# Patient Record
Sex: Female | Born: 1950 | Race: White | Hispanic: No | Marital: Single | Smoking: Never smoker
Health system: Southern US, Community
[De-identification: ages and names within clinical notes are randomized; demographics above are authoritative.]

## PROBLEM LIST (undated history)

## (undated) DIAGNOSIS — Z923 Personal history of irradiation: Secondary | ICD-10-CM

## (undated) DIAGNOSIS — Z9221 Personal history of antineoplastic chemotherapy: Secondary | ICD-10-CM

## (undated) DIAGNOSIS — K649 Unspecified hemorrhoids: Secondary | ICD-10-CM

## (undated) DIAGNOSIS — G43909 Migraine, unspecified, not intractable, without status migrainosus: Secondary | ICD-10-CM

## (undated) DIAGNOSIS — C801 Malignant (primary) neoplasm, unspecified: Secondary | ICD-10-CM

## (undated) DIAGNOSIS — K219 Gastro-esophageal reflux disease without esophagitis: Secondary | ICD-10-CM

## (undated) DIAGNOSIS — Z809 Family history of malignant neoplasm, unspecified: Secondary | ICD-10-CM

## (undated) DIAGNOSIS — J45909 Unspecified asthma, uncomplicated: Secondary | ICD-10-CM

## (undated) DIAGNOSIS — M8580 Other specified disorders of bone density and structure, unspecified site: Secondary | ICD-10-CM

## (undated) DIAGNOSIS — Z1379 Encounter for other screening for genetic and chromosomal anomalies: Secondary | ICD-10-CM

## (undated) DIAGNOSIS — D1722 Benign lipomatous neoplasm of skin and subcutaneous tissue of left arm: Secondary | ICD-10-CM

## (undated) DIAGNOSIS — R569 Unspecified convulsions: Secondary | ICD-10-CM

## (undated) DIAGNOSIS — F419 Anxiety disorder, unspecified: Secondary | ICD-10-CM

## (undated) HISTORY — DX: Family history of malignant neoplasm, unspecified: Z80.9

## (undated) HISTORY — PX: ERCP: SHX60

## (undated) HISTORY — DX: Unspecified asthma, uncomplicated: J45.909

## (undated) HISTORY — DX: Encounter for other screening for genetic and chromosomal anomalies: Z13.79

## (undated) HISTORY — DX: Other specified disorders of bone density and structure, unspecified site: M85.80

## (undated) HISTORY — PX: CATARACT EXTRACTION: SUR2

## (undated) HISTORY — DX: Anxiety disorder, unspecified: F41.9

## (undated) HISTORY — PX: APPENDECTOMY: SHX54

## (undated) HISTORY — PX: LIVER SURGERY: SHX698

## (undated) HISTORY — PX: CHOLECYSTECTOMY: SHX55

## (undated) HISTORY — PX: ABDOMINAL HYSTERECTOMY: SHX81

---

## 1998-11-02 ENCOUNTER — Emergency Department (HOSPITAL_COMMUNITY): Admission: EM | Admit: 1998-11-02 | Discharge: 1998-11-02 | Payer: Self-pay | Admitting: Emergency Medicine

## 2006-05-11 ENCOUNTER — Emergency Department (HOSPITAL_COMMUNITY): Admission: EM | Admit: 2006-05-11 | Discharge: 2006-05-11 | Payer: Self-pay | Admitting: Emergency Medicine

## 2008-06-01 ENCOUNTER — Emergency Department (HOSPITAL_COMMUNITY): Admission: EM | Admit: 2008-06-01 | Discharge: 2008-06-01 | Payer: Self-pay | Admitting: Emergency Medicine

## 2012-03-26 ENCOUNTER — Emergency Department (HOSPITAL_COMMUNITY): Payer: Self-pay

## 2012-03-26 ENCOUNTER — Encounter (HOSPITAL_COMMUNITY): Payer: Self-pay | Admitting: *Deleted

## 2012-03-26 ENCOUNTER — Emergency Department (HOSPITAL_COMMUNITY)
Admission: EM | Admit: 2012-03-26 | Discharge: 2012-03-27 | Disposition: A | Discharge status: Home / Self Care | Payer: Self-pay | Attending: Emergency Medicine | Admitting: Emergency Medicine

## 2012-03-26 DIAGNOSIS — G43909 Migraine, unspecified, not intractable, without status migrainosus: Secondary | ICD-10-CM | POA: Insufficient documentation

## 2012-03-26 DIAGNOSIS — J45909 Unspecified asthma, uncomplicated: Secondary | ICD-10-CM | POA: Insufficient documentation

## 2012-03-26 DIAGNOSIS — R569 Unspecified convulsions: Secondary | ICD-10-CM | POA: Insufficient documentation

## 2012-03-26 DIAGNOSIS — H1130 Conjunctival hemorrhage, unspecified eye: Secondary | ICD-10-CM | POA: Insufficient documentation

## 2012-03-26 DIAGNOSIS — Z79899 Other long term (current) drug therapy: Secondary | ICD-10-CM | POA: Insufficient documentation

## 2012-03-26 HISTORY — DX: Unspecified hemorrhoids: K64.9

## 2012-03-26 HISTORY — DX: Migraine, unspecified, not intractable, without status migrainosus: G43.909

## 2012-03-26 HISTORY — DX: Unspecified convulsions: R56.9

## 2012-03-26 LAB — CBC WITH DIFFERENTIAL/PLATELET
Basophils Relative: 1 % (ref 0–1)
Eosinophils Absolute: 0.1 10*3/uL (ref 0.0–0.7)
Eosinophils Relative: 1 % (ref 0–5)
HCT: 41.4 % (ref 36.0–46.0)
MCV: 89 fL (ref 78.0–100.0)
Monocytes Absolute: 0.4 10*3/uL (ref 0.1–1.0)
Neutrophils Relative %: 49 % (ref 43–77)
RDW: 12.5 % (ref 11.5–15.5)
WBC: 7 10*3/uL (ref 4.0–10.5)

## 2012-03-26 LAB — BASIC METABOLIC PANEL
CO2: 27 mEq/L (ref 19–32)
Calcium: 9.8 mg/dL (ref 8.4–10.5)
GFR calc non Af Amer: 89 mL/min — ABNORMAL LOW (ref >90)
Glucose, Bld: 93 mg/dL (ref 70–99)
Potassium: 3.2 mEq/L — ABNORMAL LOW (ref 3.5–5.1)

## 2012-03-26 MED ORDER — SODIUM CHLORIDE 0.9 % IV BOLUS (SEPSIS)
1000.0000 mL | Freq: Once | INTRAVENOUS | Status: AC
  Administered 2012-03-26: 1000 mL via INTRAVENOUS

## 2012-03-26 MED ORDER — PROMETHAZINE HCL 25 MG PO TABS: 25.0000 mg | ORAL_TABLET | Freq: Four times a day (QID) | ORAL | Status: DC | PRN

## 2012-03-26 MED ORDER — DEXAMETHASONE SODIUM PHOSPHATE 4 MG/ML IJ SOLN: 10.0000 mg | Freq: Once | INTRAMUSCULAR | Status: DC

## 2012-03-26 MED ORDER — DEXAMETHASONE SODIUM PHOSPHATE 10 MG/ML IJ SOLN
10.0000 mg | Freq: Once | INTRAMUSCULAR | Status: AC
  Administered 2012-03-26: 10 mg via INTRAVENOUS
  Filled 2012-03-26: qty 1

## 2012-03-26 MED ORDER — SODIUM CHLORIDE 0.9 % IV SOLN: INTRAVENOUS | Status: DC

## 2012-03-26 MED ORDER — PROMETHAZINE HCL 25 MG/ML IJ SOLN
12.5000 mg | Freq: Once | INTRAMUSCULAR | Status: AC
  Administered 2012-03-26: 12.5 mg via INTRAVENOUS
  Filled 2012-03-26: qty 1

## 2012-03-26 MED ORDER — HYDROMORPHONE HCL PF 1 MG/ML IJ SOLN
1.0000 mg | Freq: Once | INTRAMUSCULAR | Status: AC
  Administered 2012-03-26: 1 mg via INTRAVENOUS
  Filled 2012-03-26: qty 1

## 2012-03-26 MED ORDER — HYDROCODONE-ACETAMINOPHEN 5-325 MG PO TABS: 1.0000 | ORAL_TABLET | Freq: Four times a day (QID) | ORAL | Status: AC | PRN

## 2012-03-26 MED ORDER — DIPHENHYDRAMINE HCL 50 MG/ML IJ SOLN
25.0000 mg | Freq: Once | INTRAMUSCULAR | Status: AC
  Administered 2012-03-26: 25 mg via INTRAVENOUS
  Filled 2012-03-26: qty 1

## 2012-03-26 NOTE — ED Notes (Signed)
Headache for 2 days, had a sz last pm. Has hx of sz, treated with vistaril?    Has subconjunctival hemorrhage rt eye.

## 2012-03-26 NOTE — ED Notes (Addendum)
Had seizure last pm and afterwards noticed bleed in her eye. States it feels like pins and needles. Has also had a HA x 2 days came in because it became unbearable. States she is seeing black spots that float around, "like a beach ball." States that whenever she has a migraine this occurs. Also states that lights and sound make HA worse.

## 2012-03-26 NOTE — ED Provider Notes (Signed)
History  This chart was scribed for Shelda Jakes, MD by Erskine Emery. This patient was seen in room APA15/[facility]15 and the patient's care was started at 17:00.   CSN: [id number]  Arrival date & time 03/26/12  1620   First MD Initiated Contact with Patient 03/26/12 1700      Chief Complaint  Patient presents with  . Headache    (Consider location/radiation/quality/duration/timing/severity/associated sxs/prior treatment) The history is provided by the patient. No language interpreter was used.  Mary Wise is a 61 y.o. female who presents to the Emergency Department complaining of a severe (10/10) headache as of the last 2 days, that starts at the back of the neck and radiates to the forehead. Pt reports her pain is similar to previous episodes in her h/o migraines. Pt also reports she had a seizure last night, around midnight with associated LOC, subconjictival hemorrhage, and eye pain. She reports this seizure was similar to previous episodes in her h/o seizures. Pt denies any nausea, vomiting, fever, chest pain, abdominal pain, or dysuria but does report a rash under the breasts bilaterally. Pt has profuse rectal bleeding regularly from a worsening h/o hemorrhoids. Pt also reports some SOB, associated with asthma and claims she has already used her inhaler twice today. 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 Pt usually takes vistaril for the seizures and Midrin for her migraines but reports she never takes her medication as prescribed because she saves it for when things get really bad. Pt claims she also has a h/o fibromyalgia and arthritis. Pt is allergic to penicillin, sulfa antibiotics, onions, and yellow dyes.  Pt currently has no PCP because she has no insurance or money. She reports she tried to get into the free clinic and they wouldn't take her because she couldn't present tax records. She used to see Dr. Carlyle Basques in [place], [place]  but moved here in 2006.   Past Medical History  Diagnosis Date  . Migraine   . Seizures   . Hemorrhoids     Past Surgical History  Procedure Date  . Cholecystectomy   . Appendectomy   . Cesarean section   . Abdominal hysterectomy     History reviewed. No pertinent family history.  History  Substance Use Topics  . Smoking status: Never Smoker   . Smokeless tobacco: Not on file  . Alcohol Use: No    OB History    Grav Para Term Preterm Abortions TAB SAB Ect Mult Living                  Review of Systems  Constitutional: Negative for fever and chills.  HENT: Negative for sore throat.   Eyes: Positive for pain and redness.  Respiratory: Positive for shortness of breath.   Cardiovascular: Negative for chest pain.  Gastrointestinal: Positive for anal bleeding. Negative for nausea, vomiting and abdominal pain.  Genitourinary: Negative for dysuria.  Musculoskeletal: Negative for joint swelling.  Skin: Positive for rash.  Neurological: Positive for seizures, syncope and headaches.  Psychiatric/Behavioral: Negative for self-injury.  All other systems reviewed and are negative.    Allergies  Onion; Penicillins; Sulfa antibiotics; and Yellow dyes (non-tartrazine)  Home Medications   Current Outpatient Rx  Name Route Sig Dispense Refill  . HYDROXYZINE PAMOATE 25 MG PO CAPS Oral Take 25 mg by mouth daily as needed. For symptoms of seizure    . POLYVINYL ALCOHOL-POVIDONE 5-6 MG/ML OP  SOLN Ophthalmic Apply 50 drops to eye 3 (three) times daily as needed. For dry eye relief.    Marland Kitchen HYDROCODONE-ACETAMINOPHEN 5-325 MG PO TABS Oral Take 1-2 tablets by mouth every 6 (six) hours as needed for pain. 14 tablet 0  . PROMETHAZINE HCL 25 MG PO TABS Oral Take 1 tablet (25 mg total) by mouth every 6 (six) hours as needed for nausea. 12 tablet 0    Triage Vitals: BP 146/96  Pulse 86  Temp 98.4 F (36.9 C) (Oral)  Resp 18  Ht 5\' 11"  (1.803 m)  Wt 160 lb (72.576 kg)  BMI 22.32  kg/m2  SpO2 96%  Physical Exam  Nursing note and vitals reviewed. Constitutional: She is oriented to person, place, and time. She appears well-developed and well-nourished. No distress.  HENT:  Head: Normocephalic and atraumatic.  Mouth/Throat: Oropharynx is clear and moist.  Eyes: EOM are normal. Pupils are equal, round, and reactive to light.       Right eye: subconjuctival hemorrhage but no hyphema.  Neck: Neck supple. No tracheal deviation present. No thyromegaly present.  Cardiovascular: Normal rate, regular rhythm and normal heart sounds.   No murmur heard. Pulmonary/Chest: Effort normal and breath sounds normal. No respiratory distress. She has no wheezes.  Abdominal: Soft. Bowel sounds are normal. She exhibits no distension. There is no tenderness.  Musculoskeletal: Normal range of motion. She exhibits no edema.       Can move all extremities easily.   Lymphadenopathy:    She has no cervical adenopathy.  Neurological: She is alert and oriented to person, place, and time.  Skin: Skin is warm and dry.  Psychiatric: She has a normal mood and affect.    ED Course  Procedures (including critical care time) DIAGNOSTIC STUDIES: Oxygen Saturation is 96% on room air, adequate by my interpretation.    COORDINATION OF CARE: 17:15--I evaluated the patient and we discussed a treatment plan including CAT scan and migraine cocktail to which the pt agreed. I instructed the pt to find a PCP. I told the pt that her hemorrhoids have progressed enough that she probably needs surgery. I recommended she soak in the tub for a couple minutes a day.   Labs Reviewed  BASIC METABOLIC PANEL - Abnormal; Notable for the following:    Potassium 3.2 (*)     GFR calc non Af Amer 89 (*)     All other components within normal limits  CBC WITH DIFFERENTIAL   Ct Head Wo Contrast  03/26/2012  *RADIOLOGY REPORT*  Clinical Data: 61 year old female with headache, seizure this morning.  CT HEAD WITHOUT  CONTRAST  Technique:  Contiguous axial images were obtained from the base of the skull through the vertex without contrast.  Comparison: None.  Findings: Visualized paranasal sinuses and mastoids are clear. Benign mild exostosis of the calvarium at the left vertex.  No osseous abnormality identified.  Postoperative changes to the globes.  Negative scalp soft tissues.  Normal cerebral volume.  No ventriculomegaly. No evidence of cortically based acute infarction identified.  No acute intracranial hemorrhage identified.  No midline shift, mass effect, or evidence of mass lesion.  Minimal to mild subcortical white matter hypodensity. No suspicious intracranial vascular hyperdensity.  IMPRESSION: Negative for age noncontrast CT appearance of the brain.   Original Report Authenticated By: Harley Hallmark, M.D.    Results for orders placed during the hospital encounter of 03/26/12  CBC WITH DIFFERENTIAL      Component Value Range  WBC 7.0  4.0 - 10.5 K/uL   RBC 4.65  3.87 - 5.11 MIL/uL   Hemoglobin 14.0  12.0 - 15.0 g/dL   HCT 78.2  95.6 - 21.3 %   MCV 89.0  78.0 - 100.0 fL   MCH 30.1  26.0 - 34.0 pg   MCHC 33.8  30.0 - 36.0 g/dL   RDW 08.6  57.8 - 46.9 %   Platelets 313  150 - 400 K/uL   Neutrophils Relative 49  43 - 77 %   Neutro Abs 3.4  1.7 - 7.7 K/uL   Lymphocytes Relative 44  12 - 46 %   Lymphs Abs 3.1  0.7 - 4.0 K/uL   Monocytes Relative 6  3 - 12 %   Monocytes Absolute 0.4  0.1 - 1.0 K/uL   Eosinophils Relative 1  0 - 5 %   Eosinophils Absolute 0.1  0.0 - 0.7 K/uL   Basophils Relative 1  0 - 1 %   Basophils Absolute 0.1  0.0 - 0.1 K/uL  BASIC METABOLIC PANEL      Component Value Range   Sodium 141  135 - 145 mEq/L   Potassium 3.2 (*) 3.5 - 5.1 mEq/L   Chloride 103  96 - 112 mEq/L   CO2 27  19 - 32 mEq/L   Glucose, Bld 93  70 - 99 mg/dL   BUN 12  6 - 23 mg/dL   Creatinine, Ser 6.29  0.50 - 1.10 mg/dL   Calcium 9.8  8.4 - 52.8 mg/dL   GFR calc non Af Amer 89 (*) >90 mL/min   GFR  calc Af Amer >90  >90 mL/min     1. Migraine headache       MDM   Patient's main complaint was migraine headache. It had gotten severe improve somewhat migraine cocktail improved significantly with 1 mg of hydromorphone IV. Patient's headache is now completely resolved. Patient does not have a primary care provider so resource guide provided to help her find a primary care provider currently nontoxic no acute distress. Patient also has a history of external hemorrhoids discussion of what needs to be done for that was given to her starting point will be a primary care Dr.  [place] Pink CT was negative.     I personally performed the services described in this documentation, which was scribed in my presence. The recorded information has been reviewed and considered.     Shelda Jakes, MD 03/27/12 [phone]

## 2015-10-28 ENCOUNTER — Emergency Department (HOSPITAL_COMMUNITY): Payer: Self-pay

## 2015-10-28 ENCOUNTER — Emergency Department (HOSPITAL_COMMUNITY)
Admission: EM | Admit: 2015-10-28 | Discharge: 2015-10-29 | Disposition: A | Discharge status: Home / Self Care | Payer: Self-pay | Attending: Emergency Medicine | Admitting: Emergency Medicine

## 2015-10-28 ENCOUNTER — Encounter (HOSPITAL_COMMUNITY): Payer: Self-pay

## 2015-10-28 DIAGNOSIS — Z79899 Other long term (current) drug therapy: Secondary | ICD-10-CM | POA: Insufficient documentation

## 2015-10-28 DIAGNOSIS — R1111 Vomiting without nausea: Secondary | ICD-10-CM

## 2015-10-28 DIAGNOSIS — R6889 Other general symptoms and signs: Secondary | ICD-10-CM

## 2015-10-28 DIAGNOSIS — J111 Influenza due to unidentified influenza virus with other respiratory manifestations: Secondary | ICD-10-CM | POA: Insufficient documentation

## 2015-10-28 DIAGNOSIS — R509 Fever, unspecified: Secondary | ICD-10-CM

## 2015-10-28 LAB — CBC WITH DIFFERENTIAL/PLATELET
Basophils Absolute: 0.1 10*3/uL (ref 0.0–0.1)
Basophils Relative: 1 %
EOS ABS: 0 10*3/uL (ref 0.0–0.7)
EOS PCT: 1 %
HCT: 38.2 % (ref 36.0–46.0)
Hemoglobin: 12.9 g/dL (ref 12.0–15.0)
LYMPHS ABS: 0.7 10*3/uL (ref 0.7–4.0)
LYMPHS PCT: 15 %
MCH: 30.1 pg (ref 26.0–34.0)
MCHC: 33.8 g/dL (ref 30.0–36.0)
MCV: 89 fL (ref 78.0–100.0)
MONO ABS: 0.5 10*3/uL (ref 0.1–1.0)
MONOS PCT: 11 %
Neutro Abs: 3.5 10*3/uL (ref 1.7–7.7)
Neutrophils Relative %: 72 %
PLATELETS: 233 10*3/uL (ref 150–400)
RBC: 4.29 MIL/uL (ref 3.87–5.11)
RDW: 12.8 % (ref 11.5–15.5)
WBC: 4.9 10*3/uL (ref 4.0–10.5)

## 2015-10-28 MED ORDER — SODIUM CHLORIDE 0.9 % IV BOLUS (SEPSIS): 1000.0000 mL | Freq: Once | INTRAVENOUS | Status: DC

## 2015-10-28 MED ORDER — SODIUM CHLORIDE 0.9 % IV BOLUS (SEPSIS)
1000.0000 mL | INTRAVENOUS | Status: AC
  Administered 2015-10-29 (×2): 1000 mL via INTRAVENOUS

## 2015-10-28 MED ORDER — ONDANSETRON HCL 4 MG/2ML IJ SOLN
4.0000 mg | Freq: Once | INTRAMUSCULAR | Status: AC
  Administered 2015-10-29: 4 mg via INTRAVENOUS
  Filled 2015-10-28: qty 2

## 2015-10-28 MED ORDER — SODIUM CHLORIDE 0.9 % IV BOLUS (SEPSIS)
500.0000 mL | INTRAVENOUS | Status: AC
  Administered 2015-10-29: 500 mL via INTRAVENOUS

## 2015-10-28 MED ORDER — ACETAMINOPHEN 500 MG PO TABS
1000.0000 mg | ORAL_TABLET | Freq: Once | ORAL | Status: AC
  Administered 2015-10-29: 1000 mg via ORAL
  Filled 2015-10-28: qty 2

## 2015-10-28 NOTE — ED Notes (Signed)
I have nausea and vomiting. Having chills. Body aches and fever.

## 2015-10-28 NOTE — ED Provider Notes (Signed)
CSN: [id number]     Arrival date & time 10/28/15  2223 History  By signing my name below, I, [facility], attest that this documentation has been prepared under the direction and in the presence of Merryl Hacker, MD. Electronically Signed: Virgel Bouquet, ED Scribe. 10/28/2015. 2:32 AM.   Chief Complaint  Patient presents with  . Emesis   The history is provided by the patient. No language interpreter was used.   HPI Comments: Mary Wise is a 65 y.o. female with an hx of seizures who presents to the Emergency Department complaining of intermittent, mild emesis onset 4 days ago. Patient reports general malaise for the past 4 days with nausea, subjective fevers, chills, generalized body aches, and back pain. She endorses associated SOB. She has used OTC pain patches, ear drops, isopropyl alcohol and witch hazel, and taken cool showers without relief. She has not received the influenza vaccination this year. Hx of asthma. Denies diarrhea, abdominal pain, CP, sore throat.  Past Medical History  Diagnosis Date  . Migraine   . Seizures ([place])   . Hemorrhoids    Past Surgical History  Procedure Laterality Date  . Cholecystectomy    . Appendectomy    . Cesarean section    . Abdominal hysterectomy     No family history on file. Social History  Substance Use Topics  . Smoking status: Never Smoker   . Smokeless tobacco: None  . Alcohol Use: No   OB History    No data available     Review of Systems  Constitutional: Positive for fever and chills.  HENT: Negative for sore throat.   Respiratory: Positive for shortness of breath.   Cardiovascular: Negative for chest pain.  Gastrointestinal: Positive for nausea and vomiting. Negative for abdominal pain and diarrhea.  Genitourinary: Negative for dysuria and hematuria.  Neurological: Positive for headaches.  All other systems reviewed and are negative.     Allergies  Onion; Penicillins; Sulfa antibiotics; and Yellow  dyes (non-tartrazine)  Home Medications   Prior to Admission medications   Medication Sig Start Date End Date Taking? Authorizing Provider  albuterol (PROVENTIL HFA;VENTOLIN HFA) 108 (90 Base) MCG/ACT inhaler Inhale 2 puffs into the lungs every 6 (six) hours as needed for wheezing or shortness of breath.   Yes Historical Provider, MD  ibuprofen (ADVIL,MOTRIN) 400 MG tablet Take 1 tablet (400 mg total) by mouth every 6 (six) hours as needed. 10/29/15   Merryl Hacker, MD  ondansetron (ZOFRAN ODT) 4 MG disintegrating tablet Take 1 tablet (4 mg total) by mouth every 8 (eight) hours as needed for nausea or vomiting. 10/29/15   Merryl Hacker, MD  promethazine (PHENERGAN) 25 MG tablet Take 1 tablet (25 mg total) by mouth every 6 (six) hours as needed for nausea. 03/26/12 04/02/12  Fredia Sorrow, MD   BP 118/68 mmHg  Pulse 97  Temp(Src) 101.1 F (38.4 C) (Rectal)  Resp 20  Ht 5\' 10"  (1.778 m)  Wt 160 lb (72.576 kg)  BMI 22.96 kg/m2  SpO2 92% Physical Exam  Constitutional: She is oriented to person, place, and time. She appears well-developed and well-nourished.  Uncomfortable appearing but nontoxic  HENT:  Head: Normocephalic and atraumatic.  Right Ear: External ear normal.  Left Ear: External ear normal.  Mouth/Throat: Oropharynx is clear and moist. No oropharyngeal exudate.  Eyes: Pupils are equal, round, and reactive to light.  Cardiovascular: Regular rhythm and normal heart sounds.   Tachycardia  Pulmonary/Chest: Effort normal and  breath sounds normal. No respiratory distress. She has no wheezes.  Abdominal: Soft. Bowel sounds are normal. There is no tenderness. There is no rebound and no guarding.  Neurological: She is alert and oriented to person, place, and time.  Skin: Skin is warm and dry.  Psychiatric: She has a normal mood and affect.  Nursing note and vitals reviewed.   ED Course  Procedures   DIAGNOSTIC STUDIES: Oxygen Saturation is 98% on RA, normal by my  interpretation.    COORDINATION OF CARE: 11:16 PM Will order IV fluids, Zofran, chest x-ray, labs. Discussed treatment plan with pt at bedside and pt agreed to plan.   Labs Review Labs Reviewed  COMPREHENSIVE METABOLIC PANEL - Abnormal; Notable for the following:    Glucose, Bld 110 (*)    Calcium 8.7 (*)    AST 52 (*)    All other components within normal limits  URINALYSIS, ROUTINE W REFLEX MICROSCOPIC (NOT AT [facility]) - Abnormal; Notable for the following:    Hgb urine dipstick LARGE (*)    Ketones, ur >80 (*)    All other components within normal limits  URINE MICROSCOPIC-ADD ON - Abnormal; Notable for the following:    Squamous Epithelial / LPF 0-5 (*)    Bacteria, UA FEW (*)    All other components within normal limits  CULTURE, BLOOD (ROUTINE X 2)  CULTURE, BLOOD (ROUTINE X 2)  URINE CULTURE  CBC WITH DIFFERENTIAL/PLATELET  LIPASE, BLOOD  LACTIC ACID, PLASMA  TROPONIN I  CK    Imaging Review Dg Chest 2 View  10/29/2015  CLINICAL DATA:  Acute onset of generalized malaise, nausea, subjective fever, chills, generalized body aches and back pain. Shortness of breath. Initial encounter. EXAM: CHEST  2 VIEW COMPARISON:  None. FINDINGS: The lungs are well-aerated. Mild left basilar atelectasis is noted. There is no evidence of pleural effusion or pneumothorax. The heart is normal in size; the mediastinal contour is within normal limits. No acute osseous abnormalities are seen. IMPRESSION: Mild left basilar atelectasis noted.  Lungs otherwise clear. Electronically Signed   By: Garald Balding M.D.   On: 10/29/2015 00:37   I have personally reviewed and evaluated these images and lab results as part of my medical decision-making.   EKG Interpretation   Date/Time:  Sunday October 28 2015 23:43:19 EDT Ventricular Rate:  95 PR Interval:  127 QRS Duration: 101 QT Interval:  337 QTC Calculation: 424 R Axis:   55 Text Interpretation:  Sinus rhythm Premature ventricular complexes  Minimal  ST depression, diffuse leads Confirmed by Sanyia Dini  MD, Jevon Littlepage ([id number]) on  10/29/2015 12:09:48 AM      MDM   Final diagnoses:  Flu-like symptoms  Other specified fever  Non-intractable vomiting without nausea, vomiting of unspecified type    Patient presents with chills, myalgia, upper respiratory symptoms and vomiting. She is uncomfortable appearing but nontoxic. Does have tachycardia to 120.  Rectal temp 102.4. Sepsis workup initiated. Workup is largely unremarkable including normal lactate. No evidence of pneumonia or urinary tract infection. Suspect flulike illness and viral syndrome. She is 3 days into illness and does not meet criteria for Tamiflu. She's received several liters of fluid and nausea medication. On recheck, she states that she "feels better." Vital signs have normalized. She is able to tolerate fluids. Discuss with patient further supportive care at home with Zofran, ibuprofen. Patient stated understanding.  After history, exam, and medical workup I feel the patient has been appropriately medically screened and is safe  for discharge home. Pertinent diagnoses were discussed with the patient. Patient was given return precautions.  I personally performed the services described in this documentation, which was scribed in my presence. The recorded information has been reviewed and is accurate.    Merryl Hacker, MD 10/29/15 [phone]

## 2015-10-29 LAB — COMPREHENSIVE METABOLIC PANEL
ALT: 45 U/L (ref 14–54)
ANION GAP: 9 (ref 5–15)
AST: 52 U/L — ABNORMAL HIGH (ref 15–41)
Albumin: 4.3 g/dL (ref 3.5–5.0)
Alkaline Phosphatase: 87 U/L (ref 38–126)
BUN: 8 mg/dL (ref 6–20)
CALCIUM: 8.7 mg/dL — AB (ref 8.9–10.3)
CHLORIDE: 103 mmol/L (ref 101–111)
CO2: 23 mmol/L (ref 22–32)
Creatinine, Ser: 0.68 mg/dL (ref 0.44–1.00)
GFR calc non Af Amer: >60 mL/min (ref >60)
Glucose, Bld: 110 mg/dL — ABNORMAL HIGH (ref 65–99)
POTASSIUM: 3.7 mmol/L (ref 3.5–5.1)
SODIUM: 135 mmol/L (ref 135–145)
Total Bilirubin: 0.4 mg/dL (ref 0.3–1.2)
Total Protein: 7.8 g/dL (ref 6.5–8.1)

## 2015-10-29 LAB — URINALYSIS, ROUTINE W REFLEX MICROSCOPIC
BILIRUBIN URINE: NEGATIVE
Glucose, UA: NEGATIVE mg/dL
Ketones, ur: >80 mg/dL — AB
LEUKOCYTES UA: NEGATIVE
NITRITE: NEGATIVE
Protein, ur: NEGATIVE mg/dL
SPECIFIC GRAVITY, URINE: 1.01 (ref 1.005–1.030)
pH: 8 (ref 5.0–8.0)

## 2015-10-29 LAB — URINE MICROSCOPIC-ADD ON

## 2015-10-29 LAB — TROPONIN I

## 2015-10-29 LAB — LIPASE, BLOOD: Lipase: 35 U/L (ref 11–51)

## 2015-10-29 LAB — LACTIC ACID, PLASMA: LACTIC ACID, VENOUS: 1 mmol/L (ref 0.5–2.0)

## 2015-10-29 LAB — CK: Total CK: 86 U/L (ref 38–234)

## 2015-10-29 MED ORDER — ONDANSETRON 4 MG PO TBDP: 4.0000 mg | ORAL_TABLET | Freq: Three times a day (TID) | ORAL | Status: DC | PRN

## 2015-10-29 MED ORDER — IBUPROFEN 400 MG PO TABS: 400.0000 mg | ORAL_TABLET | Freq: Four times a day (QID) | ORAL | Status: DC | PRN

## 2015-10-29 MED ORDER — IBUPROFEN 400 MG PO TABS
ORAL_TABLET | ORAL | Status: AC
  Administered 2015-10-29: 600 mg
  Filled 2015-10-29: qty 2

## 2015-10-29 MED ORDER — IBUPROFEN 400 MG PO TABS
600.0000 mg | ORAL_TABLET | Freq: Once | ORAL | Status: DC
Start: 2015-10-29 — End: 2015-10-29
  Filled 2015-10-29: qty 2

## 2015-10-29 NOTE — ED Notes (Signed)
Pt c/o cramping all over not just in stomach.  "It's in my ribs, my back bone, behind my knees".

## 2015-10-29 NOTE — ED Notes (Signed)
PT provided with ginger ale, no vomiting

## 2015-10-29 NOTE — ED Notes (Signed)
Pt states understanding of care given and follow up instructions.  Ambulated from ED  

## 2015-10-29 NOTE — ED Notes (Signed)
Pt had one small amount of "spit up" since medication.

## 2015-10-29 NOTE — Discharge Instructions (Signed)
Viral Infections °A viral infection can be caused by different types of viruses. Most viral infections are not serious and resolve on their own. However, some infections may cause severe symptoms and may lead to further complications. °SYMPTOMS °Viruses can frequently cause: °· Minor sore throat. °· Aches and pains. °· Headaches. °· Runny nose. °· Different types of rashes. °· Watery eyes. °· Tiredness. °· Cough. °· Loss of appetite. °· Gastrointestinal infections, resulting in nausea, vomiting, and diarrhea. °These symptoms do not respond to antibiotics because the infection is not caused by bacteria. However, you might catch a bacterial infection following the viral infection. This is sometimes called a "superinfection." Symptoms of such a bacterial infection may include: °· Worsening sore throat with pus and difficulty swallowing. °· Swollen neck glands. °· Chills and a high or persistent fever. °· Severe headache. °· Tenderness over the sinuses. °· Persistent overall ill feeling (malaise), muscle aches, and tiredness (fatigue). °· Persistent cough. °· Yellow, green, or brown mucus production with coughing. °HOME CARE INSTRUCTIONS  °· Only take over-the-counter or prescription medicines for pain, discomfort, diarrhea, or fever as directed by your caregiver. °· Drink enough water and fluids to keep your urine clear or pale yellow. Sports drinks can provide valuable electrolytes, sugars, and hydration. °· Get plenty of rest and maintain proper nutrition. Soups and broths with crackers or rice are fine. °SEEK IMMEDIATE MEDICAL CARE IF:  °· You have severe headaches, shortness of breath, chest pain, neck pain, or an unusual rash. °· You have uncontrolled vomiting, diarrhea, or you are unable to keep down fluids. °· You or your child has an oral temperature above 102° F (38.9° C), not controlled by medicine. °· Your baby is older than 3 months with a rectal temperature of 102° F (38.9° C) or higher. °· Your baby is 3  months old or younger with a rectal temperature of 100.4° F (38° C) or higher. °MAKE SURE YOU:  °· Understand these instructions. °· Will watch your condition. °· Will get help right away if you are not doing well or get worse. °  °This information is not intended to replace advice given to you by your health care provider. Make sure you discuss any questions you have with your health care provider. °  °Document Released: 04/30/2005 Document Revised: 10/13/2011 Document Reviewed: 12/27/2014 °Elsevier Interactive Patient Education ©2016 [employer] ° °

## 2015-10-31 LAB — URINE CULTURE

## 2015-11-03 LAB — CULTURE, BLOOD (ROUTINE X 2)
CULTURE: NO GROWTH
Culture: NO GROWTH

## 2016-08-04 HISTORY — PX: BREAST LUMPECTOMY: SHX2

## 2016-12-30 DIAGNOSIS — Z789 Other specified health status: Secondary | ICD-10-CM | POA: Diagnosis not present

## 2016-12-30 DIAGNOSIS — M25562 Pain in left knee: Secondary | ICD-10-CM | POA: Diagnosis not present

## 2016-12-30 DIAGNOSIS — M170 Bilateral primary osteoarthritis of knee: Secondary | ICD-10-CM | POA: Diagnosis not present

## 2016-12-30 DIAGNOSIS — M25561 Pain in right knee: Secondary | ICD-10-CM | POA: Diagnosis not present

## 2017-01-06 DIAGNOSIS — Z789 Other specified health status: Secondary | ICD-10-CM | POA: Diagnosis not present

## 2017-01-06 DIAGNOSIS — M25562 Pain in left knee: Secondary | ICD-10-CM | POA: Diagnosis not present

## 2017-01-06 DIAGNOSIS — M25561 Pain in right knee: Secondary | ICD-10-CM | POA: Diagnosis not present

## 2017-01-06 DIAGNOSIS — M170 Bilateral primary osteoarthritis of knee: Secondary | ICD-10-CM | POA: Diagnosis not present

## 2017-01-13 DIAGNOSIS — M25562 Pain in left knee: Secondary | ICD-10-CM | POA: Diagnosis not present

## 2017-01-13 DIAGNOSIS — M170 Bilateral primary osteoarthritis of knee: Secondary | ICD-10-CM | POA: Diagnosis not present

## 2017-01-13 DIAGNOSIS — M25561 Pain in right knee: Secondary | ICD-10-CM | POA: Diagnosis not present

## 2017-05-26 DIAGNOSIS — M25561 Pain in right knee: Secondary | ICD-10-CM | POA: Diagnosis not present

## 2017-05-26 DIAGNOSIS — M170 Bilateral primary osteoarthritis of knee: Secondary | ICD-10-CM | POA: Diagnosis not present

## 2017-05-26 DIAGNOSIS — M25562 Pain in left knee: Secondary | ICD-10-CM | POA: Diagnosis not present

## 2017-06-03 ENCOUNTER — Encounter: Payer: Self-pay | Admitting: Family Medicine

## 2017-06-03 ENCOUNTER — Ambulatory Visit (INDEPENDENT_AMBULATORY_CARE_PROVIDER_SITE_OTHER): Payer: Medicare Other | Admitting: Family Medicine

## 2017-06-03 VITALS — BP 140/70 | HR 84 | Temp 98.2°F | Resp 16 | Ht 68.0 in | Wt 191.8 lb

## 2017-06-03 DIAGNOSIS — J4520 Mild intermittent asthma, uncomplicated: Secondary | ICD-10-CM

## 2017-06-03 DIAGNOSIS — R030 Elevated blood-pressure reading, without diagnosis of hypertension: Secondary | ICD-10-CM | POA: Diagnosis not present

## 2017-06-03 DIAGNOSIS — R7309 Other abnormal glucose: Secondary | ICD-10-CM

## 2017-06-03 DIAGNOSIS — Z113 Encounter for screening for infections with a predominantly sexual mode of transmission: Secondary | ICD-10-CM

## 2017-06-03 DIAGNOSIS — F39 Unspecified mood [affective] disorder: Secondary | ICD-10-CM | POA: Diagnosis not present

## 2017-06-03 DIAGNOSIS — R5383 Other fatigue: Secondary | ICD-10-CM | POA: Diagnosis not present

## 2017-06-03 DIAGNOSIS — M7989 Other specified soft tissue disorders: Secondary | ICD-10-CM

## 2017-06-03 DIAGNOSIS — N649 Disorder of breast, unspecified: Secondary | ICD-10-CM | POA: Diagnosis not present

## 2017-06-03 DIAGNOSIS — G43009 Migraine without aura, not intractable, without status migrainosus: Secondary | ICD-10-CM

## 2017-06-03 DIAGNOSIS — M799 Soft tissue disorder, unspecified: Secondary | ICD-10-CM

## 2017-06-03 MED ORDER — NAPROXEN 500 MG PO TABS: 500.0000 mg | ORAL_TABLET | Freq: Two times a day (BID) | ORAL | 0 refills | Status: DC | PRN

## 2017-06-03 MED ORDER — ALBUTEROL SULFATE HFA 108 (90 BASE) MCG/ACT IN AERS: 1.0000 | INHALATION_SPRAY | Freq: Four times a day (QID) | RESPIRATORY_TRACT | 1 refills | Status: DC | PRN

## 2017-06-03 NOTE — Progress Notes (Signed)
Patient ID: Mary Wise, female    DOB: [birth year], 66 y.o.   MRN: [id number]  Chief Complaint  Patient presents with  . Mass    left breast, left side of head, left shoulder    Allergies Onion; Penicillins; Sulfa antibiotics; Yellow dyes (non-tartrazine); and Clindamycin/lincomycin  Subjective:   Mary Wise is a 66 y.o. female who presents to [facility] today.  HPI Here to establish care. Reports that she is very emotional b/c her niece died and is going to be buried tomorrow and she has a migraine. She reports that she has not seen a doctor in many years other than going to various emergency departments. She gives a long list of medications that she takes, but reports she doesn't always take them every day and she tends to swallow oral a way medications to use when she needs them. Upon further questioning patient reports that she does get medications that she needs from her family members.  She reports that she comes in today because she has had a knot on shoulder since 2006. Reports that she was seen by a cancer doctor but he never did a biopsy, he just kept measuring the lesion. She reports she also had an x-ray and it did not show up on the x-ray. She is unable to remember the oncologist who did the evaluation in the past. But then tells me that this was done in [place] or another state. She does report that has been going to the [facility] clinic in [place] for knee pain. She reports that the lady who does the sonograms at the clinic, she became friends with her and she decided to ultrasound the area on her shoulder and in her breast. Reports that was told by the sonographer that she has a "gray mass" on left shoulder that is solid and has a breast mass on the left breast which was told by the sonographer that it was like a piece of hard coal in her breast. She reports she has had no other evaluation of these areas/lesions since 2004 or 2006.  She reports that both  of these areas have been there since 2004. However they have grown since that time. She reports that the areas are painful. Reports that because of the size of these lesions she cannot lift her left arm. Reports that ibuprofen does not do anything for her pain. She reports that she does not want to try any other medications for the pain other than hydrocodone because she is scared of other medications. She reports that these new medications can be more dangerous and she is only interested in trying hydrocodone.  She reports that she also needs medicine for her anxiety. She reports that she has had anxiety since she was 66 years old. Reports she was always a hyper child and very busy and advanced for her age. Reports she has been on Valium on and off since she was 66 years old. However, also reports that she has taken ativan and xanax. When asked patient where she has been getting the medications she says she has saved them for long periods of time and that she has been getting them from her mother and brother. Reports that takes them b/c she is very "hypertension, hyper, anxious". Reports that is scared to take other medications for headache and nerves other that the xanax, ativan, or valium.   Patient reports that she has a migraine today would like a refill on her medication.  Reports she has been given Lortab by the emergency department for her pain due to her migraines. She has never been tried on any other migraine medicine. She reports that this headache started today and it is very similar to her other migraines. Reports she has had migraines for decades. She has not taken any medication today for the headache.  Patient reports that she does not want any other type of medication other than Xanax, Ativan, and Valium for her anxiety. She reports she has been seen by behavioral health in the past and gone to group therapy but she does not see the point. Reports that she knows that nothing else will work for  her and she does not want to try anything else.  When asked why patient has not sought any medical care over the past 10+ years for these lesions/masses she reports she has just not had insurance.  Patient reports that she would like a refill on her albuterol inhaler. Reports that she was given Ventolin and does not like it like she likes the albuterol. She reports that the Ventolin makes her very shaky in the albuterol does not make her shaky. Reports that she uses the inhaler sporadically for her asthma. Reports she has had asthma as a child. Denies any pneumonia or hospitalizations for asthma or intubations. Does not use the inhaler more than once a week.   Migraine   This is a chronic problem. The current episode started more than 1 year ago. The problem occurs intermittently. The problem has been unchanged. The pain is located in the left unilateral region. The pain does not radiate. The pain quality is similar to prior headaches. The quality of the pain is described as aching and sharp. The pain is at a severity of 7/10. The pain is moderate. Associated symptoms include coughing, nausea, phonophobia, photophobia and seizures. Pertinent negatives include no abnormal behavior, anorexia, dizziness, drainage, eye redness, eye watering, facial sweating, loss of balance, neck pain, numbness, rhinorrhea, sore throat, visual change, vomiting, weakness or weight loss. Nothing aggravates the symptoms. She has tried acetaminophen, oral narcotics and NSAIDs for the symptoms. The treatment provided significant relief. Her past medical history is significant for migraine headaches and migraines in the family. There is no history of cancer, cluster headaches, hypertension or sinus disease.    Past Medical History:  Diagnosis Date  . Anxiety   . Hemorrhoids   . Migraine   . Migraine   . Seizures ([place])     Past Surgical History:  Procedure Laterality Date  . ABDOMINAL HYSTERECTOMY    . APPENDECTOMY    .  CESAREAN SECTION    . CHOLECYSTECTOMY    . LIVER SURGERY      Family History  Problem Relation Age of Onset  . Cancer Mother   . Cancer Father   . Cancer Sister   . Diabetes Brother   . Cancer Maternal Grandmother   . Cancer Other   . Diabetes Sister   . Diabetes Sister   . Diabetes Sister      Social History   Social History  . Marital status: Single    Spouse name: N/A  . Number of children: N/A  . Years of education: N/A   Social History Main Topics  . Smoking status: Never Smoker  . Smokeless tobacco: Never Used  . Alcohol use No  . Drug use: No  . Sexual activity: No   Other Topics Concern  . None   Social History  Narrative   Lives in [facility]isted [facility] on [address].    Current Outpatient Prescriptions on File Prior to Visit  Medication Sig Dispense Refill  . albuterol (PROVENTIL HFA;VENTOLIN HFA) 108 (90 Base) MCG/ACT inhaler Inhale 2 puffs into the lungs every 6 (six) hours as needed for wheezing or shortness of breath.    . promethazine (PHENERGAN) 25 MG tablet Take 1 tablet (25 mg total) by mouth every 6 (six) hours as needed for nausea. 12 tablet 0   No current facility-administered medications on file prior to visit.     Review of Systems  Constitutional: Negative for activity change, appetite change, chills, diaphoresis, unexpected weight change and weight loss.  HENT: Negative for rhinorrhea, sinus pain and sore throat.   Eyes: Positive for photophobia. Negative for redness.  Respiratory: Positive for cough. Negative for chest tightness, shortness of breath, wheezing and stridor.   Cardiovascular: Negative for chest pain, palpitations and leg swelling.  Gastrointestinal: Positive for nausea. Negative for anorexia and vomiting.  Endocrine: Negative for polydipsia and polyphagia.  Genitourinary: Negative for difficulty urinating, dysuria, hematuria, pelvic pain and urgency.  Musculoskeletal: Negative for neck pain.  Skin: Negative for color change,  pallor, rash and wound.  Neurological: Positive for seizures and headaches. Negative for dizziness, tremors, syncope, speech difficulty, weakness, light-headedness, numbness and loss of balance.       Headaches are similar to her typical migraines. No change in headache quality. Patient reports that she has had a history of seizures in the past when she was a child but has not had them in years.  Hematological: Negative for adenopathy. Does not bruise/bleed easily.  Psychiatric/Behavioral: Negative for agitation, behavioral problems, decreased concentration, dysphoric mood, hallucinations, self-injury, sleep disturbance and suicidal ideas. The patient is nervous/anxious.      Objective:   BP 140/70 (BP Location: Right Arm, Patient Position: Sitting, Cuff Size: Normal)   Pulse 84   Temp 98.2 F (36.8 C) (Other (Comment))   Resp 16   Ht 5\' 8"  (1.727 m)   Wt 191 lb 12 oz (87 kg)   SpO2 93%   BMI 29.16 kg/m   Physical Exam  Constitutional: She is oriented to person, place, and time. She appears well-developed and well-nourished.  HENT:  Head: Normocephalic and atraumatic.  Eyes: Pupils are equal, round, and reactive to light. EOM are normal.  Neck: Normal range of motion. Neck supple.  Cardiovascular: Normal rate, regular rhythm and normal heart sounds.   Pulmonary/Chest: Right breast exhibits tenderness. Right breast exhibits no inverted nipple. Left breast exhibits inverted nipple, mass and tenderness. Left breast exhibits no nipple discharge and no skin change. There is no breast swelling.  Right breast- no palpable lesions, however patient exquisitely tender to palpation with exam. No nipple discharge or skin changes appreciated.  Left  Breast-  approx 10 cm from nipple, at 2 o'clock position, circumscribed, mobile, hard/solid lesion 4x5 cm tender to palpation, with no associated overlying skin changes. No axillary lymphadenopathy palpated bilaterally, however exam limited due to  patient experiencing discomfort.  Genitourinary: There is breast tenderness. No breast discharge or bleeding.  Neurological: She is alert and oriented to person, place, and time.  Skin: Skin is warm, dry and intact.  Left shoulder/biceps region- soft tissue lesion, approximate 23 cm x 20 cm, mobile circumscribed, smooth borders in bicept/deltoid region of left arm. No associated skin discoloration.    Psychiatric: Her speech is normal. Thought content normal. Her mood appears anxious. Her affect is labile. Cognition  and memory are not impaired. She exhibits normal recent memory and normal remote memory.  Appropriately dressed and groomed female, tearful and labile throughout visit. Very dramatically relates stories of traumatic events in her life. Denies suicidal or homicidal ideations. Seemingly normal cognitive function. Thought content without appreciable delusions, phobias, obsessions, or compulsions. Behavior normal but somewhat slowed. Smells of tobacco smoke but denies cigarette or tobacco use or being around others to smoke. Judgment seems normal but questionable in light of reported history of no medical care in over 10 years for large growths on body.  Vitals reviewed.    Assessment and Plan   1. Breast lesion Patient sent for mammogram and evaluation of lesion. Compliance with appointments recommended and discussed. - MM Digital Diagnostic Unilat L; Future - MM DIAG BREAST TOMO UNI LEFT; Future  2. Soft tissue lesion of shoulder region Discussed with patient that despite the large size of this lesion it is somewhat reassuring that his been there for over 12 years. Will refer for ultrasound and to Gen. surgery for evaluation. - [place] LT UPPER EXTREM LTD SOFT TISSUE NON VASCULAR; Future - Ambulatory referral to General Surgery  3. Mood disorder [facility]) Patient finally agreeable to see psychiatrist since I would not give her Xanax, Ativan, or Valium today. She refuses to try any other  medication for management of her anxiety. She was counseled that if her mood worsened or she developed any suicidal thoughts that she could call 911 or go to the emergency department for evaluation. She reports she has never had any suicidal thoughts and would be fine if she could just get some of her medicine. We discussed that benzodiazepines were not the recommended choice of treatment for her anxiety and they tend to mask or cover up symptoms rather than dealing with the chronic mood disorder. She was finally agreeable to see behavioral health. I also discussed with patient that it is not good to take other people's medicine or to share medicines with others. Patient also requested a refill for Ambien. We discussed that these were all very high risk medications and that it could result in side effects, death, or disability. - Ambulatory referral to Psychiatry  4. Mild intermittent asthma, unspecified whether complicated Explained to patient that Proventil, Ventolin, and albuterol were basically the same medications. She did request a different inhaler. Refilled. - albuterol (PROVENTIL HFA;VENTOLIN HFA) 108 (90 Base) MCG/ACT inhaler; Inhale 1-2 puffs into the lungs every 6 (six) hours as needed for wheezing or shortness of breath.  Dispense: 1 Inhaler; Refill: 1 Patient was counseled concerning worrisome or concerning aspects of asthma. She was told if she had to increase the frequency of her inhaler use or developed breathing problems to go to the emergency department or call or return to clinic. 5. Migraine without aura and without status migrainosus, not intractable Discussed with patient in detail that opioids were not recommended management for migraine headaches. Patient was asked to keep a migraine diary and try to figure out what were her migraine triggers. She has a long history of migraines and reports that the Lortab or Midrin is what takes care of her headache the best. We discussed that I  would not be prescribing her opioids for migraine management. At this time will try Naprosyn as directed. Risks of this medication were discussed. She voiced understanding. Patient was counseled concerning worrisome signs and symptoms of headache. Discussed risks of cardiovascular thrombotic events related to NSAIDS. Discussed increased risk of AMI and CVA. Discussed  risk of serious GI adverse events including bleeding, ulcers, and perforation. Patient understands risks of this medication.   - naproxen (NAPROSYN) 500 MG tablet; Take 1 tablet (500 mg total) by mouth 2 (two) times daily as needed (migraine).  Dispense: 40 tablet; Refill: 0  6. Elevated blood pressure reading Patient has no prior history of hypertension, but elevated blood pressure reading today. Patient will follow up and we will check her blood pressure. Her sister is probably elevated because of her migraine headache. Dietary modifications recommended to lower blood pressure including salt reduction.  7. Abnormal blood sugar Check lab due to history of abnormal blood sugar. - Hemoglobin A1c  8. Screen for STD (sexually transmitted disease) Patient requests screening, but defers any urine testing. - HIV antibody - Hepatitis panel, acute - RPR  9. Fatigue, unspecified type Check labs today. Patient reports she feels she is tired due to her poor sleeping patterns. She requests refill for Ambien. Sleep hygiene discussed. Will not refill Ambien at this time nor in the future. - Basic metabolic panel - CBC with Differential/Platelet - Hepatic function panel - HIV antibody - TSH OV was greater than 60 minutes today. Greater than 50% of office visit today was spent counseling patient regarding medications, habit-forming medications, risks of medications, reasons why medication should not be provided for certain conditions, and her recommended course of treatment. She was encouraged to keep these appointments for evaluation of her  breast lesion and the lesion in her shoulder. Patient assured me that she was being honest regarding her previous treatment and evaluation. She reported she would have any records sent to our office, but she is not sure where she had treatment but she would think about it. Return in about 4 weeks (around 07/01/2017) for follow up. [name], MD 06/03/2017

## 2017-06-12 ENCOUNTER — Ambulatory Visit (HOSPITAL_COMMUNITY): Payer: Medicare Other

## 2017-06-15 ENCOUNTER — Other Ambulatory Visit: Payer: Self-pay | Admitting: Family Medicine

## 2017-06-15 ENCOUNTER — Ambulatory Visit (HOSPITAL_COMMUNITY)
Admission: RE | Admit: 2017-06-15 | Discharge: 2017-06-15 | Disposition: A | Discharge status: Home / Self Care | Payer: Medicare Other | Source: Ambulatory Visit | Attending: Family Medicine | Admitting: Family Medicine

## 2017-06-15 DIAGNOSIS — M799 Soft tissue disorder, unspecified: Secondary | ICD-10-CM

## 2017-06-15 DIAGNOSIS — R2232 Localized swelling, mass and lump, left upper limb: Secondary | ICD-10-CM | POA: Diagnosis not present

## 2017-06-15 DIAGNOSIS — R229 Localized swelling, mass and lump, unspecified: Principal | ICD-10-CM

## 2017-06-15 DIAGNOSIS — M7989 Other specified soft tissue disorders: Secondary | ICD-10-CM | POA: Diagnosis present

## 2017-06-15 DIAGNOSIS — IMO0002 Reserved for concepts with insufficient information to code with codable children: Secondary | ICD-10-CM

## 2017-06-16 ENCOUNTER — Ambulatory Visit (HOSPITAL_COMMUNITY)
Admission: RE | Admit: 2017-06-16 | Discharge: 2017-06-16 | Disposition: A | Discharge status: Home / Self Care | Payer: Medicare Other | Source: Ambulatory Visit | Attending: Family Medicine | Admitting: Family Medicine

## 2017-06-16 ENCOUNTER — Other Ambulatory Visit: Payer: Self-pay | Admitting: Family Medicine

## 2017-06-16 DIAGNOSIS — R928 Other abnormal and inconclusive findings on diagnostic imaging of breast: Secondary | ICD-10-CM | POA: Diagnosis not present

## 2017-06-16 DIAGNOSIS — R229 Localized swelling, mass and lump, unspecified: Principal | ICD-10-CM

## 2017-06-16 DIAGNOSIS — N649 Disorder of breast, unspecified: Secondary | ICD-10-CM

## 2017-06-16 DIAGNOSIS — IMO0002 Reserved for concepts with insufficient information to code with codable children: Secondary | ICD-10-CM

## 2017-06-16 DIAGNOSIS — N6489 Other specified disorders of breast: Secondary | ICD-10-CM | POA: Diagnosis not present

## 2017-06-16 DIAGNOSIS — N6321 Unspecified lump in the left breast, upper outer quadrant: Secondary | ICD-10-CM | POA: Insufficient documentation

## 2017-06-18 ENCOUNTER — Telehealth: Payer: Self-pay | Admitting: *Deleted

## 2017-06-18 ENCOUNTER — Telehealth: Payer: Self-pay | Admitting: Family Medicine

## 2017-06-18 ENCOUNTER — Ambulatory Visit (INDEPENDENT_AMBULATORY_CARE_PROVIDER_SITE_OTHER): Payer: Medicare Other | Admitting: General Surgery

## 2017-06-18 VITALS — BP 164/106 | HR 107 | Temp 98.9°F | Resp 18 | Ht 68.0 in | Wt 194.0 lb

## 2017-06-18 DIAGNOSIS — D1722 Benign lipomatous neoplasm of skin and subcutaneous tissue of left arm: Secondary | ICD-10-CM

## 2017-06-18 DIAGNOSIS — N6320 Unspecified lump in the left breast, unspecified quadrant: Secondary | ICD-10-CM

## 2017-06-18 DIAGNOSIS — N6321 Unspecified lump in the left breast, upper outer quadrant: Secondary | ICD-10-CM | POA: Diagnosis not present

## 2017-06-18 NOTE — Telephone Encounter (Signed)
Patient called stating she is returning breanna's call

## 2017-06-18 NOTE — Telephone Encounter (Signed)
Spoke to patient. See previous tele note

## 2017-06-18 NOTE — Telephone Encounter (Signed)
Please call patient and advised her that I have received the results of the ultrasound of the area on her shoulder and of the area in her breast.  Please tell her that I am very glad that she went to both of the appointments.  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  This mass could be removed by a general surgeon and I will go ahead and do a referral for her at this time.  I received the report from Dr. Owens Shark from her mammogram.  Please keep the appointment for the ultrasound-guided biopsy of her breast.  Please let her know that we are thinking of her and we will wait to hear back regarding the biopsy.  Please advise her I have placed the referral to Dr. Constance Haw for the lipoma on her shoulder.

## 2017-06-18 NOTE — Telephone Encounter (Signed)
Patient informed of message below. She states she is having panic attacks, and that the medication for the migraines is not working. She states she is not sleeping.

## 2017-06-18 NOTE — Telephone Encounter (Signed)
She will need to follow up to discuss. However, we discussed last time that she needs to see behavioral health and that valium, xanax, and ativan are not good management for long term anxiety. Please advise to follow up.  Gwen Her. Mannie Stabile, MD

## 2017-06-18 NOTE — Telephone Encounter (Signed)
I called patient to make her aware to schedule an appointment, no answer I left message for her to return call.

## 2017-06-18 NOTE — Telephone Encounter (Signed)
Called patient regarding message below. No answer, left generic message for patient to return call.   

## 2017-06-19 ENCOUNTER — Other Ambulatory Visit: Payer: Self-pay | Admitting: General Surgery

## 2017-06-19 ENCOUNTER — Encounter: Payer: Self-pay | Admitting: General Surgery

## 2017-06-19 DIAGNOSIS — N6320 Unspecified lump in the left breast, unspecified quadrant: Secondary | ICD-10-CM | POA: Insufficient documentation

## 2017-06-19 DIAGNOSIS — D1722 Benign lipomatous neoplasm of skin and subcutaneous tissue of left arm: Secondary | ICD-10-CM | POA: Insufficient documentation

## 2017-06-19 DIAGNOSIS — R2232 Localized swelling, mass and lump, left upper limb: Secondary | ICD-10-CM

## 2017-06-19 NOTE — Progress Notes (Signed)
[facility] History and Physical  Reason for Referral: Left shoulder lipoma  Referring Physician:  Dr. Mannie Stabile   Chief Complaint    Lipoma      Mary Wise is a 66 y.o. female.  HPI: Ms. Knisely is a very pleasant 66 yo who has not had any medical care for some time, and has just recently went to see Dr. Mannie Stabile as a new PCP.  During this visit, the patient reported having a large left lateral shoulder/ arm mass which was large and had been growing somewhat in the past few months, but had been relatively stable in the last 12 years per her report.  She says that it may have grown 20-25% in the last few months in the inferior direction down her arm.  This mass is associated with tingling and cool sensations in her fingers, and she reports that she has some difficulty grasping in that hand at times.  She states that the mass makes it hard to do work, and she is constantly hitting the area on things.  During her visit to Dr. Mannie Stabile she was also sent for a mammogram, and had this completed this week, which demonstrated a highly suspicious lesion in her left breast that needs a biopsy.  She reports that this lesion had been there for several years and that it gets bruised often when she hits it with her arm.  She denies any history of any cancers or family history of any cancers.   She is accompanied by her sister who she reports helps her with her healthcare. The recently lost a sister to pancreatic cancer per their report, and the idea of a breast cancer is very scary.   Past Medical History:  Diagnosis Date  . Anxiety   . Hemorrhoids   . Migraine   . Migraine   . Seizures ([place])     Past Surgical History:  Procedure Laterality Date  . ABDOMINAL HYSTERECTOMY    . APPENDECTOMY    . CESAREAN SECTION    . CHOLECYSTECTOMY    . LIVER SURGERY      Family History  Problem Relation Age of Onset  . Cancer Mother   . Cancer Father   . Cancer Sister   . Diabetes Brother   .  Cancer Maternal Grandmother   . Cancer Other   . Diabetes Sister   . Diabetes Sister   . Diabetes Sister     Social History   Tobacco Use  . Smoking status: Never Smoker  . Smokeless tobacco: Never Used  Substance Use Topics  . Alcohol use: No  . Drug use: No    Medications: I have reviewed the patient's current medications. Allergies as of 06/18/2017      Reactions   Onion Other (See Comments)   REACTION: results in coma state   Penicillins Hives, Shortness Of Breath   Sulfa Antibiotics Hives, Shortness Of Breath   Yellow Dyes (non-tartrazine)    REACTION: #5 and #90 "will kill me"   Clindamycin/lincomycin    vomiting      Medication List        Accurate as of 06/18/17 11:59 PM. Always use your most recent med list.          albuterol 108 (90 Base) MCG/ACT inhaler Commonly known as:  PROVENTIL HFA;VENTOLIN HFA Inhale 2 puffs into the lungs every 6 (six) hours as needed for wheezing or shortness of breath.   albuterol 108 (90 Base) MCG/ACT inhaler Commonly  known as:  PROVENTIL HFA;VENTOLIN HFA Inhale 1-2 puffs into the lungs every 6 (six) hours as needed for wheezing or shortness of breath.   naproxen 500 MG tablet Commonly known as:  NAPROSYN Take 1 tablet (500 mg total) by mouth 2 (two) times daily as needed (migraine).   promethazine 25 MG tablet Commonly known as:  PHENERGAN Take 1 tablet (25 mg total) by mouth every 6 (six) hours as needed for nausea.   zolpidem 10 MG tablet Commonly known as:  AMBIEN Take 10 mg by mouth at bedtime as needed for sleep.       ROS:  A comprehensive review of systems was negative except for: Constitutional: positive for headaches Respiratory: positive for asthma and wheezing Cardiovascular: positive for varicose veins Gastrointestinal: positive for reflux symptoms Musculoskeletal: positive for back pain, bone pain, neck pain, stiff joints and large lesion on left shoulder/ lateral arm, numbness and tingling in left  fingers, decreased grip strength Neurological: positive for seizures and tremors Endocrine: positive for tired/ sluggish  Blood pressure (!) 164/106, pulse (!) 107, temperature 98.9 F (37.2 C), resp. rate 18, height 5\' 8"  (1.727 m), weight 194 lb (88 kg). Physical Exam  Constitutional: She is oriented to person, place, and time. Vital signs are normal. She appears not malnourished. She appears healthy. She appears distressed.  HENT:  Head: Normocephalic.  Eyes: Pupils are equal, round, and reactive to light.  Cardiovascular: Normal rate.  Pulmonary/Chest: Effort normal and breath sounds normal.  Right breast and axilla without lesions, left breast upper outer quadrant approximately 4cm, fixed, hard mass, tender, axilla on left without adenopathy  Abdominal: Soft. She exhibits no distension. There is no tenderness.  Well healed incision   Musculoskeletal: Normal range of motion.  Left lateral upper arm/ shoulder with large lipoma, 13cm, difficult to say if mobile, minimally tender  Neurological: She is alert and oriented to person, place, and time.  Skin: Skin is warm and dry. No erythema.  Psychiatric: Memory, affect and judgment normal. Her mood appears anxious.  Vitals reviewed.   Results: [place] Left arm- reviewed with radiologist, difficult to say if the lesion interdigitates with the muscle or extends into the muscle IMPRESSION: 13.8 x 10 x 13.1 cm homogeneous mass overlying the left shoulder most consistent with a lipoma.  US/ Mammogram- left breast mass highly suspicious, left upper outer quadrant    Assessment & Plan:  Mary Wise is a 66 y.o. female with a large lipoma on her left upper arm/ shoulder with reports of tingling and numbness and decreased grip strength. The lesion is on the lateral side of the arm and does not appear to extend into the axilla where it should cause any compression type symptoms.  I reviewed the [place] with the radiologist, and given the depth of the  [place] it is not possible to say if the lesion interdigitates the muscle or extends deep. This patient also has new findings of a left breast mass highly suspicious for cancer.   -Changed breast biopsy to 11/20 from 11/27 -Scheduled patient for an MRI of the left arm/ shoulder given the symptoms and radiology not having a full view of the inferior portion of the lesion  -Plan to see the patient 11/27 and Dr. Mannie Stabile is seeing her 11/28, if she remains hypertensive on these visits, she may need to start on a medication before a surgery  -Discussed with the patient and her sister that if this lesion on the left shoulder is a  lipoma and does not interdigitate or involved any muscle/ important structures, I could likely resect it at the same time as the breast lesion- pending pathology.  The patient would then only require one operation for both issues.   All questions were answered to the satisfaction of the patient and family.  Called patient 11/16 and updated her on the conversation with radiology and plan for MRI 11/20, and told her to arrive at 9:45AM as indicated by the appointment on Epic.   Virl Cagey 06/19/2017, 1:34 PM

## 2017-06-23 ENCOUNTER — Ambulatory Visit (HOSPITAL_COMMUNITY): Payer: Medicare Other

## 2017-06-23 ENCOUNTER — Ambulatory Visit (HOSPITAL_COMMUNITY)
Admission: RE | Admit: 2017-06-23 | Discharge: 2017-06-23 | Disposition: A | Discharge status: Home / Self Care | Payer: Medicare Other | Source: Ambulatory Visit | Attending: Family Medicine | Admitting: Family Medicine

## 2017-06-23 ENCOUNTER — Encounter (HOSPITAL_COMMUNITY): Payer: Self-pay

## 2017-06-23 ENCOUNTER — Ambulatory Visit (HOSPITAL_COMMUNITY)
Admission: RE | Admit: 2017-06-23 | Discharge: 2017-06-23 | Disposition: A | Discharge status: Home / Self Care | Payer: Medicare Other | Source: Ambulatory Visit | Attending: General Surgery | Admitting: General Surgery

## 2017-06-23 ENCOUNTER — Other Ambulatory Visit: Payer: Self-pay | Admitting: Family Medicine

## 2017-06-23 DIAGNOSIS — C50412 Malignant neoplasm of upper-outer quadrant of left female breast: Secondary | ICD-10-CM | POA: Diagnosis not present

## 2017-06-23 DIAGNOSIS — N6320 Unspecified lump in the left breast, unspecified quadrant: Secondary | ICD-10-CM

## 2017-06-23 DIAGNOSIS — N6321 Unspecified lump in the left breast, upper outer quadrant: Secondary | ICD-10-CM | POA: Diagnosis not present

## 2017-06-23 DIAGNOSIS — R2232 Localized swelling, mass and lump, left upper limb: Secondary | ICD-10-CM

## 2017-06-23 DIAGNOSIS — R229 Localized swelling, mass and lump, unspecified: Secondary | ICD-10-CM

## 2017-06-23 DIAGNOSIS — IMO0002 Reserved for concepts with insufficient information to code with codable children: Secondary | ICD-10-CM

## 2017-06-24 ENCOUNTER — Telehealth: Payer: Self-pay | Admitting: General Surgery

## 2017-06-24 DIAGNOSIS — F419 Anxiety disorder, unspecified: Secondary | ICD-10-CM

## 2017-06-24 DIAGNOSIS — F411 Generalized anxiety disorder: Secondary | ICD-10-CM

## 2017-06-24 MED ORDER — ALPRAZOLAM 1 MG PO TABS: 1.0000 mg | ORAL_TABLET | Freq: Once | ORAL | 0 refills | Status: DC

## 2017-06-24 MED ORDER — ALPRAZOLAM 1 MG PO TABS: 1.0000 mg | ORAL_TABLET | ORAL | 0 refills | Status: DC

## 2017-06-24 NOTE — Telephone Encounter (Addendum)
Monday AM 11/26 rescheduled for the MRI of the left arm/ shoulder. Was very anxious on Tuesday for the MRI and had to be canceled.   Needs to arrive at 7AM for the MRI at [place].    Take an Xanax 1mg  tablet 4 hours prior to your MRI (4AM).  If you are still anxious at 6AM take a second pill. Dispense #2  She has used Xanax in the past per her report.   She does not have a ride to pick up the medication in the clinic before it closes. The printed prescriptions would not print despite multiple attempts at printing it.  Hand written prescription  Xanax 1mg  tablet Take 1 tablet at 4AM prior to your MRI which is at 8AM and take an additional tablet at 6AM if needed for anxiety. Dispense #2 tablets   Told the patient to come to [facility] desk and ask for the Administrative Coordinator who this Rx. She will arrive around 4-415pm today 11/21.       Curlene Labrum, MD [facility] [address] [place], [place] [address] [phone] (office)

## 2017-06-29 ENCOUNTER — Other Ambulatory Visit: Payer: Self-pay | Admitting: General Surgery

## 2017-06-29 ENCOUNTER — Ambulatory Visit (HOSPITAL_COMMUNITY): Payer: Medicare Other | Attending: General Surgery

## 2017-06-29 ENCOUNTER — Ambulatory Visit (HOSPITAL_COMMUNITY)
Admission: RE | Admit: 2017-06-29 | Discharge: 2017-06-29 | Disposition: A | Discharge status: Home / Self Care | Payer: Medicare Other | Source: Ambulatory Visit | Attending: General Surgery | Admitting: General Surgery

## 2017-06-29 DIAGNOSIS — D1722 Benign lipomatous neoplasm of skin and subcutaneous tissue of left arm: Secondary | ICD-10-CM | POA: Diagnosis not present

## 2017-06-29 DIAGNOSIS — R2232 Localized swelling, mass and lump, left upper limb: Secondary | ICD-10-CM

## 2017-06-29 DIAGNOSIS — M75112 Incomplete rotator cuff tear or rupture of left shoulder, not specified as traumatic: Secondary | ICD-10-CM | POA: Diagnosis not present

## 2017-06-29 DIAGNOSIS — M7989 Other specified soft tissue disorders: Secondary | ICD-10-CM | POA: Diagnosis not present

## 2017-06-29 LAB — POCT I-STAT CREATININE: CREATININE: 0.7 mg/dL (ref 0.44–1.00)

## 2017-06-29 MED ORDER — GADOBENATE DIMEGLUMINE 529 MG/ML IV SOLN
20.0000 mL | Freq: Once | INTRAVENOUS | Status: AC
  Administered 2017-06-29: 17 mL via INTRAVENOUS

## 2017-06-30 ENCOUNTER — Ambulatory Visit (HOSPITAL_COMMUNITY): Payer: Medicare Other

## 2017-06-30 ENCOUNTER — Ambulatory Visit (INDEPENDENT_AMBULATORY_CARE_PROVIDER_SITE_OTHER): Payer: Medicare Other | Admitting: General Surgery

## 2017-06-30 ENCOUNTER — Encounter: Payer: Self-pay | Admitting: General Surgery

## 2017-06-30 VITALS — BP 162/94 | HR 93 | Temp 98.7°F | Resp 18 | Ht 68.0 in | Wt 195.0 lb

## 2017-06-30 DIAGNOSIS — C50912 Malignant neoplasm of unspecified site of left female breast: Secondary | ICD-10-CM | POA: Diagnosis not present

## 2017-06-30 DIAGNOSIS — D1722 Benign lipomatous neoplasm of skin and subcutaneous tissue of left arm: Secondary | ICD-10-CM | POA: Diagnosis not present

## 2017-06-30 NOTE — Patient Instructions (Signed)
Breast Cancer, Female Breast cancer is an abnormal growth of tissue (tumor) in the breast that is cancerous (malignant). Unlike noncancerous (benign) tumors, malignant tumors can spread to other parts of your body. The most common type of female breast cancer begins in the milk ducts (ductal carcinoma). Breast cancer is one of the most common types of cancer in women. What are the causes? The exact cause of female breast cancer is unknown. What increases the risk?  Age older than 59 years.  Family history of breast cancer.  Having the BRCA1 and BRCA2 genes.  Personal history of radiation exposure.  Obesity.  Menstrual periods that begin before age 57 years.  Menopause that begins after age 21 years.  Pregnant for the first time at the age of 105 years or older.  Using hormone therapy.  Drinking more than one alcoholic drink per day. What are the signs or symptoms?  A painless lump in your breast.  Changes in the size or shape of your breast.  Breast skin changes, such as puckering or dimpling.  Nipple abnormalities, such as scaling, crustiness, redness, or pulling in (retraction).  Nipple discharge that is bloody or clear. How is this diagnosed? Your health care provider will ask about your medical history. He or she may also perform a number of procedures, such as:  A physical exam. This will involve feeling the tissue around the breast and under the arms.  Taking a sample of nipple discharge. The sample will be examined under a microscope.  Breast X-rays (mammogram), breast ultrasound exams, or an MRI.  Taking a tissue sample (biopsy) from the breast. The sample will be examined under a microscope to look for cancer cells.  Your cancer will be staged to determine its severity and extent. Staging is a careful attempt to find out the size of the tumor, whether the cancer has spread, and if so, to what parts of the body. You may need to have more tests to determine the  stage of your cancer:  Stage 0-The tumor has not spread to other breast tissue.  Stage I-The cancer is only found in the breast. The tumor may be up to  in (2 cm) wide.  Stage II-The cancer has spread to nearby lymph nodes. The tumor may be up to 2 in (5 cm) wide.  Stage III-The cancer has spread to more distant lymph nodes. The tumor may be larger than 2 in (5 cm) wide.  Stage IV-The cancer has spread to other parts of the body, such as the bones, brain, liver, or lungs.  How is this treated? Depending on the type and stage, female breast cancer may be treated with one or more of the following therapies:  Surgery to remove just the tumor (lumpectomy) or the entire breast (mastectomy). Lymph nodes may also be removed.  Radiation therapy, which uses high-energy rays to kill cancer cells.  Chemotherapy, which is the use of drugs to kill cancer cells.  Hormone therapy, which involves taking medicine to adjust the hormone levels in your body. You may take medicine to decrease your estrogen levels. This can help stop cancer cells from growing.  Follow these instructions at home:  Take medicines only as directed by your health care provider.  Maintain a healthy diet.  Consider joining a support group. This may help you learn to cope with the stress of having breast cancer.  Keep all follow-up appointments as directed by your health care provider. Contact a health care provider if:  You have a sudden increase in pain.  You notice a new lump in either breast or under your arm.  You develop swelling in either arm or hand.  You lose weight without trying.  You have a fever.  You notice new fatigue or weakness. Get help right away if:  You have chest pain or trouble breathing.  You faint. This information is not intended to replace advice given to you by your health care provider. Make sure you discuss any questions you have with your health care provider. Document Released:  10/29/2005 Document Revised: 11/29/2015 Document Reviewed: 09/14/2013 Elsevier Interactive Patient Education  2017 [employer].

## 2017-06-30 NOTE — Progress Notes (Signed)
[facility] Note   HPI:  66 y.o. Female presents to after her workup for her left arm/ shoulder lipoma and her biopsies for her left breast mass. We were able to get things scheduled and completed and have the results.  Her left shoulder / upper arm mass was further evaluated with an  MRI that demonstrates that the lesion is in the deltoid muscle and extends into the quadrilateral space.  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    She reports she is doing fair. I had to prescribe her xanax to take to complete the MRI.    Review of Systems:  No fevers or chills Tenderness over the biopsy site All other review of systems: otherwise negative   Vital Signs:  BP (!) 162/94   Pulse 93   Temp 98.7 F (37.1 C)   Resp 18   Ht '5\' 8"'  (1.727 m)   Wt 195 lb (88.5 kg)   BMI 29.65 kg/m    Physical Exam:  Physical Exam  Constitutional: She is oriented to person, place, and time and well-developed, well-nourished, and in no distress.  HENT:  Head: Normocephalic.  Cardiovascular: Normal rate.  Pulmonary/Chest: Effort normal.  Left breast mass, upper outer qaudrant, 4cm, firm and fixed, no adenopathy on left; right breast without masses or adenopathy, no nipple discharge   Abdominal: Soft. She exhibits no distension. There is no tenderness.  Musculoskeletal:  Decreased abduction on the left, large 14cm+ lipoma overlying deltoid on lateral upper arm/ shoulder   Neurological: She is alert and oriented to person, place, and time.  Skin: Skin is warm and dry.  Vitals reviewed.  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 - SEE COMMENT. Microscopic Comment The carcinoma appears grade III.  ER/ PR/ Her2 negative results returned after patient's visit.  Imaging:  MRI left shoulder - IMPRESSION: 1. 9.3 x 9.4 x 12.4 cm simple intramuscular lipoma of the left deltoid muscle extending towards the quadrilateral  space. 2. Moderate tendinosis of the supraspinatus tendon with a small partial-thickness articular surface tear.  Assessment:  65 y.o. yo Female with multiple issues going including invasive left breast cancer with pathology returning after patient has left the office as ER/PR/HER2 negative.  She also has a large lipoma overlying her left deltoid that is causing some issues with abduction and invades the quadrilateral space, making me concerned about the axillary nerve.  She also reports some tingling and grip strength changes but this is not consistent with any axillary nerve dysfunction.  I have discussed with the patient and her family extensively, and I do think she needs to have both the lipoma and the left breast cancer taken care of as soon as possible.  I had hoped that this lipoma was more superficial and that I could have done both procedures on the same day, but given the invasion of the quadrilateral space and the reports in the MRI about tendinosis and superficial tears, I believe she will be better served by an orthopedic surgeon.  The family would like to consult an orthopedic surgeon in [place].  I have discussed with them that we can refer them to [facility], but that I cannot guarantee that the breast surgeons and orthopedic surgeons could do a combined case.  I think it is worth seeing if this is possible, as long as we can get her in to see them in a timely manner.    Plan:  -  Referral to Dr. Alain Marion or Dr. Donne Hazel in [place] for Orthopedics evaluation  - If Orthopedics thinks it is reasonable to do a combined case, the patient can be set up with [facility] to undergo the lumpectomy and sentinel node at the same time.  - Patient's results of ER/PR/Her 2 negative returned following her office visit, but she is large breasted and I believe this could be removed with a lumpectomy and does not requiring any neoadjuvant therapy to downsize it at this time  - Will  await the referral and plan from Orthopedics to do any preoperative testing/ etc. - If Ortho does not want to perform the surgery at the same time as the lumpectomy and sentinel node (also on the left), I can take the patient for surgery at [facility].  - Follow up after Ortho evaluation  -Patient seeing Dr. Welton Flakes later this week.   All of the above recommendations were discussed with the patient and patient's family, and all of patient's and family's questions were answered to their expressed satisfaction.  Curlene Labrum, MD [facility] [address] [place], [place] [address] [phone] (office)

## 2017-07-01 ENCOUNTER — Other Ambulatory Visit: Payer: Self-pay

## 2017-07-01 ENCOUNTER — Ambulatory Visit (INDEPENDENT_AMBULATORY_CARE_PROVIDER_SITE_OTHER): Payer: Medicare Other | Admitting: Family Medicine

## 2017-07-01 ENCOUNTER — Encounter: Payer: Self-pay | Admitting: Family Medicine

## 2017-07-01 VITALS — BP 150/90 | HR 103 | Temp 97.8°F | Resp 16 | Ht 68.0 in | Wt 193.5 lb

## 2017-07-01 DIAGNOSIS — E785 Hyperlipidemia, unspecified: Secondary | ICD-10-CM

## 2017-07-01 DIAGNOSIS — I10 Essential (primary) hypertension: Secondary | ICD-10-CM

## 2017-07-01 DIAGNOSIS — G43909 Migraine, unspecified, not intractable, without status migrainosus: Secondary | ICD-10-CM | POA: Diagnosis not present

## 2017-07-01 DIAGNOSIS — R5383 Other fatigue: Secondary | ICD-10-CM | POA: Diagnosis not present

## 2017-07-01 DIAGNOSIS — G4700 Insomnia, unspecified: Secondary | ICD-10-CM | POA: Diagnosis not present

## 2017-07-01 DIAGNOSIS — Z113 Encounter for screening for infections with a predominantly sexual mode of transmission: Secondary | ICD-10-CM | POA: Diagnosis not present

## 2017-07-01 DIAGNOSIS — R7309 Other abnormal glucose: Secondary | ICD-10-CM | POA: Diagnosis not present

## 2017-07-01 DIAGNOSIS — F419 Anxiety disorder, unspecified: Secondary | ICD-10-CM

## 2017-07-01 MED ORDER — TRAZODONE HCL 50 MG PO TABS: 50.0000 mg | ORAL_TABLET | Freq: Every day | ORAL | 2 refills | Status: DC

## 2017-07-01 MED ORDER — PROPRANOLOL HCL ER 80 MG PO CP24: 80.0000 mg | ORAL_CAPSULE | Freq: Every day | ORAL | 1 refills | Status: DC

## 2017-07-01 NOTE — Progress Notes (Signed)
Patient ID: Mary Wise, female    DOB: 04-26-1951, 66 y.o.   MRN: [id number]  Chief Complaint  Patient presents with  . Follow-up    Allergies Onion; Penicillins; Sulfa antibiotics; Yellow dyes (non-tartrazine); and Clindamycin/lincomycin  Subjective:   Mary Wise is a 66 y.o. female who presents to [facility] today.  HPI Here for a follow up. Has not gotten lab tests done, but is ready to get them performed today.. Reports that she can not sleep without her ambien.  Reports that she is used Ambien for a long time but is been out of it.  Reports even when she did not have insurance she would use her other family members Ambien.  Reports that the medicine helped her at least get several good hours of sleep.  Has never been on trazodone for sleep.  Reports that she has had trouble sleeping since she was a child.  Ms. Dixson describes herself as an anxious type person and very hyper personality type.  Reports that it is hard for her to calm down and sleep.  Was unable to take the melatonin b/c it made her stay up all night. She reports that she cleaned her house all day and night.   Reports that she is still struggling with her migraines multiple times a month.  Reports she has had them since she was a child.  Has never been on any prophylactic medications.  Is not exactly sure what triggers her migraines.  Reports that when she gets the headache she needs to lay on the floor and sleep.  Reports previously she has been given Xanax and Ativan for her migraines and those medicines tend to work.  Reports that she is not depressed and does not feel sad or down despite going through this workup for breast cancer and the issues related to her shoulder.  Reports she has good community and family support.  Reports she is active in her church and has faith that she will be healed.  Eating well.  Blood pressure is been elevated at multiple recent doctor visits in the past couple weeks.   She reports she just thought her blood pressure was always up because she is a hyper person.  Denies any chest pain, shortness of breath, palpitations, or swelling in her legs.  Reports that she can be hyper and her heart might beat fast at times.  Denies any chest pain.    Past Medical History:  Diagnosis Date  . Anxiety   . Hemorrhoids   . Migraine   . Migraine   . Seizures ([place])     Past Surgical History:  Procedure Laterality Date  . ABDOMINAL HYSTERECTOMY    . APPENDECTOMY    . CESAREAN SECTION    . CHOLECYSTECTOMY    . LIVER SURGERY      Family History  Problem Relation Age of Onset  . Cancer Mother   . Cancer Father   . Cancer Sister   . Diabetes Brother   . Cancer Maternal Grandmother   . Cancer Other   . Diabetes Sister   . Diabetes Sister   . Diabetes Sister      Social History   Socioeconomic History  . Marital status: Single    Spouse name: None  . Number of children: None  . Years of education: None  . Highest education level: None  Social Needs  . Financial resource strain: None  . Food insecurity - worry: None  .  Food insecurity - inability: None  . Transportation needs - medical: None  . Transportation needs - non-medical: None  Occupational History  . None  Tobacco Use  . Smoking status: Never Smoker  . Smokeless tobacco: Never Used  Substance and Sexual Activity  . Alcohol use: No  . Drug use: No  . Sexual activity: No  Other Topics Concern  . None  Social History Narrative   Lives in [facility]isted Living on [place].     Review of Systems   Objective:   BP (!) 150/90 (BP Location: Left Arm, Patient Position: Sitting, Cuff Size: Normal)   Pulse (!) 103   Temp 97.8 F (36.6 C) (Other (Comment))   Resp 16   Ht 5\' 8"  (1.727 m)   Wt 193 lb 8 oz (87.8 kg)   SpO2 98%   BMI 29.42 kg/m   Physical Exam  Constitutional: She is oriented to person, place, and time. She appears well-developed and well-nourished. No distress.  HENT:    Head: Normocephalic and atraumatic.  Eyes: Pupils are equal, round, and reactive to light.  Neck: Normal range of motion. Neck supple. No thyromegaly present.  Cardiovascular: Normal rate, regular rhythm and normal heart sounds.  Pulmonary/Chest: Effort normal and breath sounds normal. No respiratory distress.  Musculoskeletal: She exhibits deformity.  Large lipoma on left shoulder.  Neurological: She is alert and oriented to person, place, and time. No cranial nerve deficit.  Skin: Skin is warm and dry.  Psychiatric: Her behavior is normal. Judgment and thought content normal.  Alert and oriented in no acute distress.  Slightly labile affect.  Mood anxious.  Affect congruent with mood.  No suicidal or homicidal ideations.  No delusions, phobias, obsessions or compulsions.  Thought content goal directed and within normal limits.  Nursing note and vitals reviewed.    Assessment and Plan  1. Hyperlipidemia, unspecified hyperlipidemia type Patient will be getting labs from her last office visit today which she had not had performed yet.  We will add cholesterol panel at this time. - Lipid panel  2. HTN, goal below 140/80 Lifestyle modifications discussed with patient including a diet emphasizing vegetables, fruits, and whole grains. Limiting intake of sodium to less than 2,400 mg per day.  Recommendations discussed include consuming low-fat dairy products, poultry, fish, legumes, non-tropical vegetable oils, and nuts; and limiting intake of sweets, sugar-sweetened beverages, and red meat. Discussed following a plan such as the Dietary Approaches to Stop Hypertension (DASH) diet. Patient to read up on this diet.   - propranolol ER (INDERAL LA) 80 MG 24 hr capsule; Take 1 capsule (80 mg total) by mouth daily.  Dispense: 30 capsule; Refill: 1 At this time we will try a beta-blocker for blood pressure control secondary to her migraine history and anxious personality.  She was counseled concerning  the risks and benefits of this medication.  She will call if there is any problem with feeling the medication and if her insurance will not cover this medication at that time we will change to a calcium channel blocker to help with lowering blood pressure but also giving her some benefit with her migraine headaches. 3. Migraine without status migrainosus, not intractable, unspecified migraine type Counseled concerning worrisome signs and symptoms of headache and if occur to go to the emergency department. Encouraged to keep a migraine diary.  Did discuss with patient today that benzodiazepines were not recommended choice of treatment for migraine headaches. - propranolol ER (INDERAL LA) 80 MG 24  hr capsule; Take 1 capsule (80 mg total) by mouth daily.  Dispense: 30 capsule; Refill: 1  4. Insomnia, unspecified type Suspect sleep disturbance secondary to anxiety.  Relaxation and stress reduction discussed today.  Exercise recommended.  Do not recommend Ambien for sleep disturbance at this time.  Trial of trazodone recommended because it should help some with reducing her anxiety.  Patient will call if she has had no response in 2 weeks and at that time we will increase her dose to 100 mg.  Patient was told to call with any abrupt changes in her mood or problems.  She defers any other evaluation or workup for anxiety. Patient counseled in detail regarding the risks of medication. Told to call or return to clinic if develop any worrisome signs or symptoms. Patient voiced understanding.   - traZODone (DESYREL) 50 MG tablet; Take 1 tablet (50 mg total) by mouth at bedtime.  Dispense: 30 tablet; Refill: 2  No Follow-up on file. [name], MD 07/01/2017

## 2017-07-02 ENCOUNTER — Encounter: Payer: Self-pay | Admitting: Family Medicine

## 2017-07-02 LAB — HEPATIC FUNCTION PANEL
AG Ratio: 1.4 (calc) (ref 1.0–2.5)
ALBUMIN MSPROF: 4.4 g/dL (ref 3.6–5.1)
ALKALINE PHOSPHATASE (APISO): 104 U/L (ref 33–130)
ALT: 28 U/L (ref 6–29)
AST: 27 U/L (ref 10–35)
Bilirubin, Direct: 0.1 mg/dL (ref 0.0–0.2)
Globulin: 3.2 g/dL (calc) (ref 1.9–3.7)
Indirect Bilirubin: 0.2 mg/dL (calc) (ref 0.2–1.2)
TOTAL PROTEIN: 7.6 g/dL (ref 6.1–8.1)
Total Bilirubin: 0.3 mg/dL (ref 0.2–1.2)

## 2017-07-02 LAB — CBC WITH DIFFERENTIAL/PLATELET
BASOS ABS: 92 {cells}/uL (ref 0–200)
Basophils Relative: 1.3 %
Eosinophils Absolute: 213 cells/uL (ref 15–500)
Eosinophils Relative: 3 %
HEMATOCRIT: 41.4 % (ref 35.0–45.0)
Hemoglobin: 13.7 g/dL (ref 11.7–15.5)
LYMPHS ABS: 3145 {cells}/uL (ref 850–3900)
MCH: 28.8 pg (ref 27.0–33.0)
MCHC: 33.1 g/dL (ref 32.0–36.0)
MCV: 87.2 fL (ref 80.0–100.0)
MPV: 10.6 fL (ref 7.5–12.5)
Monocytes Relative: 6.8 %
Neutro Abs: 3167 cells/uL (ref 1500–7800)
Neutrophils Relative %: 44.6 %
Platelets: 375 10*3/uL (ref 140–400)
RBC: 4.75 10*6/uL (ref 3.80–5.10)
RDW: 12.5 % (ref 11.0–15.0)
Total Lymphocyte: 44.3 %
WBC: 7.1 10*3/uL (ref 3.8–10.8)
WBCMIX: 483 {cells}/uL (ref 200–950)

## 2017-07-02 LAB — BASIC METABOLIC PANEL
BUN: 10 mg/dL (ref 7–25)
CHLORIDE: 100 mmol/L (ref 98–110)
CO2: 29 mmol/L (ref 20–32)
Calcium: 10.6 mg/dL — ABNORMAL HIGH (ref 8.6–10.4)
Creat: 0.71 mg/dL (ref 0.50–0.99)
GLUCOSE: 85 mg/dL (ref 65–139)
Potassium: 3.9 mmol/L (ref 3.5–5.3)
Sodium: 139 mmol/L (ref 135–146)

## 2017-07-02 LAB — HEPATITIS PANEL, ACUTE
HEP A IGM: NONREACTIVE
HEP B C IGM: NONREACTIVE
HEP B S AG: NONREACTIVE
Hepatitis C Ab: NONREACTIVE
SIGNAL TO CUT-OFF: 0.03 (ref <1.00)

## 2017-07-02 LAB — HEMOGLOBIN A1C
EAG (MMOL/L): 6 (calc)
Hgb A1c MFr Bld: 5.4 % of total Hgb (ref <5.7)
Mean Plasma Glucose: 108 (calc)

## 2017-07-02 LAB — HIV ANTIBODY (ROUTINE TESTING W REFLEX): HIV: NONREACTIVE

## 2017-07-02 LAB — LIPID PANEL
Cholesterol: 231 mg/dL — ABNORMAL HIGH (ref <200)
HDL: 65 mg/dL (ref >50)
LDL CHOLESTEROL (CALC): 139 mg/dL — AB
NON-HDL CHOLESTEROL (CALC): 166 mg/dL — AB (ref <130)
TRIGLYCERIDES: 148 mg/dL (ref <150)
Total CHOL/HDL Ratio: 3.6 (calc) (ref <5.0)

## 2017-07-02 LAB — TSH: TSH: 4.04 mIU/L (ref 0.40–4.50)

## 2017-07-02 LAB — RPR: RPR Ser Ql: NONREACTIVE

## 2017-07-06 DIAGNOSIS — M25512 Pain in left shoulder: Secondary | ICD-10-CM | POA: Diagnosis not present

## 2017-07-15 DIAGNOSIS — D1720 Benign lipomatous neoplasm of skin and subcutaneous tissue of unspecified limb: Secondary | ICD-10-CM | POA: Diagnosis not present

## 2017-07-16 ENCOUNTER — Telehealth: Payer: Self-pay | Admitting: General Surgery

## 2017-07-16 ENCOUNTER — Encounter: Payer: Self-pay | Admitting: General Surgery

## 2017-07-16 NOTE — Telephone Encounter (Addendum)
Spoke with Daughter, Lysbeth Galas.  Patient getting her surgery with Ortho at [facility] from a specialist due to the involvement of the axillary nerve and the quadrilateral space. Still needs her left breast cancer removed and sentinel node done separately. Unable to do both at the same time with joint procedure.  Will tentatively put on schedule for 12/28 for partial mastectomy with sentinel node of the left breast, if the Ortho surgeon is ok with her undergoing our procedure with her arm extended. She may have her Ortho procedure 12/21 versus in January.   Will get Oncology to see in the mean time also. 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 Oncology aware. Unsure if patient will want to pursue Chemotherapy or not. Will let Oncology discuss with her given the Triple Negative status, etc.   Curlene Labrum, MD [facility] [address] [place], [place] [address] [phone] (office)

## 2017-07-17 ENCOUNTER — Other Ambulatory Visit: Payer: Self-pay | Admitting: General Surgery

## 2017-07-17 DIAGNOSIS — C50912 Malignant neoplasm of unspecified site of left female breast: Secondary | ICD-10-CM

## 2017-07-20 ENCOUNTER — Telehealth: Payer: Self-pay | Admitting: General Surgery

## 2017-07-20 NOTE — Telephone Encounter (Signed)
Mary Wise and discussed plans for surgery 12/28. Given that it has been a few weeks since I have seen Mary Wise, will plan on seeing them 12/27 @ 11AM to make sure all of their questions are answered about surgery and determine if need to add a port to procedure following the discussion with Oncology.  She is in agreement.   Curlene Labrum, MD [facility] [address] [place], [place] [address] [phone] (office)

## 2017-07-21 ENCOUNTER — Ambulatory Visit (HOSPITAL_COMMUNITY): Payer: Medicare Other | Admitting: Adult Health

## 2017-07-21 NOTE — Progress Notes (Deleted)
Mary Wise, [place] [id number]   CLINIC:  Medical Oncology/Hematology  REASON FOR CONSULTATION: ***   REFERRAL FROM:  Dr. Marland Kitchen [id number]  PRIMARY CARE PROVIDER: [name], [address] [place] [address] [phone]   HISTORY OF PRESENT ILLNESS:  ***   INTERVAL HISTORY:  ***   REVIEW OF SYSTEMS: Review of Systems - Oncology   PAST MEDICAL & SURGICAL HISTORY:  Past Medical History:  Diagnosis Date  . Anxiety   . Hemorrhoids   . Migraine   . Migraine   . Seizures ([place])    Past Surgical History:  Procedure Laterality Date  . ABDOMINAL HYSTERECTOMY    . APPENDECTOMY    . CESAREAN SECTION    . CHOLECYSTECTOMY    . LIVER SURGERY       SOCIAL HISTORY:  Social History   Socioeconomic History  . Marital status: Single    Spouse name: Not on file  . Number of children: Not on file  . Years of education: Not on file  . Highest education level: Not on file  Social Needs  . Financial resource strain: Not on file  . Food insecurity - worry: Not on file  . Food insecurity - inability: Not on file  . Transportation needs - medical: Not on file  . Transportation needs - non-medical: Not on file  Occupational History  . Not on file  Tobacco Use  . Smoking status: Never Smoker  . Smokeless tobacco: Never Used  Substance and Sexual Activity  . Alcohol use: No  . Drug use: No  . Sexual activity: No  Other Topics Concern  . Not on file  Social History Narrative   Lives in [place] [address].     CURRENT MEDICATIONS:  Current Outpatient Medications on File Prior to Visit  Medication Sig Dispense Refill  . albuterol (PROVENTIL HFA;VENTOLIN HFA) 108 (90 Base) MCG/ACT inhaler Inhale 2 puffs into the lungs every 6 (six) hours as needed for wheezing or shortness of breath.    Marland Kitchen albuterol (PROVENTIL HFA;VENTOLIN HFA) 108 (90 Base) MCG/ACT inhaler Inhale 1-2 puffs into the lungs every 6 (six)  hours as needed for wheezing or shortness of breath. (Patient not taking: Reported on 07/01/2017) 1 Inhaler 1  . ALPRAZolam (XANAX) 1 MG tablet Take 1 mg by mouth daily as needed for anxiety.     Marland Kitchen Bioflavonoid Products (BIOFLEX) TABS Take 1 tablet by mouth daily.    . Hypromellose (ARTIFICIAL TEARS OP) Apply to eye 2 (two) times daily. Doesn't do drops in eyes does as a wash to flush eyes out    . lidocaine (LIDODERM) 5 % Place 0.5-1 patches onto the skin daily as needed (pain). Remove & Discard patch within 12 hours or as directed by MD    . Menthol, Topical Analgesic, (BIOFREEZE EX) Apply 1 application topically as needed (pain).    . Menthol-Methyl Salicylate (MUSCLE RUB) 10-15 % CREA Apply 1 application topically as needed for muscle pain.    . naproxen (NAPROSYN) 500 MG tablet Take 1 tablet (500 mg total) by mouth 2 (two) times daily as needed (migraine). (Patient not taking: Reported on 07/01/2017) 40 tablet 0  . propranolol ER (INDERAL LA) 80 MG 24 hr capsule Take 1 capsule (80 mg total) by mouth daily. (Patient taking differently: Take 80 mg by mouth at bedtime. ) 30 capsule 1  . traZODone (DESYREL) 50 MG tablet Take 1 tablet (  50 mg total) by mouth at bedtime. (Patient not taking: Reported on 07/17/2017) 30 tablet 2   No current facility-administered medications on file prior to visit.      ALLERGIES: Allergies  Allergen Reactions  . Onion Other (See Comments)    REACTION: results in coma state  . Penicillins Hives, Shortness Of Breath and Swelling    Throat swelling Has patient had a PCN reaction causing immediate rash, facial/tongue/throat swelling, SOB or lightheadedness with hypotension: Yes Has patient had a PCN reaction causing severe rash involving mucus membranes or skin necrosis: No Has patient had a PCN reaction that required hospitalization: Yes Has patient had a PCN reaction occurring within the last 10 years: No If all of the above answers are "NO", then may proceed  with Cephalosporin use.   . Sulfa Antibiotics Hives and Shortness Of Breath  . Yellow Dyes (Non-Tartrazine)     REACTION: #5 and #9 "will kill me"  . Clindamycin/Lincomycin Hives and Nausea And Vomiting     PERFORMANCE STATUS: ***   PHYSICAL EXAM:  There were no vitals filed for this visit. There were no vitals filed for this visit.  Physical Exam    LABORATORY DATA: CBC    Component Value Date/Time   WBC 7.1 07/01/2017 1449   RBC 4.75 07/01/2017 1449   HGB 13.7 07/01/2017 1449   HCT 41.4 07/01/2017 1449   PLT 375 07/01/2017 1449   MCV 87.2 07/01/2017 1449   MCH 28.8 07/01/2017 1449   MCHC 33.1 07/01/2017 1449   RDW 12.5 07/01/2017 1449   LYMPHSABS 3,145 07/01/2017 1449   MONOABS 0.5 10/28/2015 2340   EOSABS 213 07/01/2017 1449   BASOSABS 92 07/01/2017 1449   CMP Latest Ref Rng & Units 07/01/2017 06/29/2017 10/28/2015  Glucose 65 - 139 mg/dL 85 - 110(H)  BUN 7 - 25 mg/dL 10 - 8  Creatinine 0.50 - 0.99 mg/dL 0.71 0.70 0.68  Sodium 135 - 146 mmol/L 139 - 135  Potassium 3.5 - 5.3 mmol/L 3.9 - 3.7  Chloride 98 - 110 mmol/L 100 - 103  CO2 20 - 32 mmol/L 29 - 23  Calcium 8.6 - 10.4 mg/dL 10.6(H) - 8.7(L)  Total Protein 6.1 - 8.1 g/dL 7.6 - 7.8  Total Bilirubin 0.2 - 1.2 mg/dL 0.3 - 0.4  Alkaline Phos 38 - 126 U/L - - 87  AST 10 - 35 U/L 27 - 52(H)  ALT 6 - 29 U/L 28 - 45     DIAGNOSTIC IMAGING:  *I have independently reviewed the following diagnostic imaging and agree with radiologic reports as listed.  ***  PATHOLOGY:  ***     ASSESSMENT & PLAN:   ***   Dispo:     -This patient was seen & evaluated by Dr. ***, who formulated the above plan of care. This was a shared visit consultation.  Please see the attestation documentation for additional details.    A total of *** minutes was spent in face-to-face care of this patient, with greater than 50% of that time spent in counseling and care coordination.    Mike Craze, NP [place] [phone]

## 2017-07-22 NOTE — Patient Instructions (Signed)
Mary Wise  07/22/2017     @PREFPERIOPPHARMACY @   Your procedure is scheduled on 07/31/17..  Report to [name] at 08:00 A.M.  Call this number if you have problems the morning of surgery:  [phone]   Remember:  Do not eat food or drink liquids after midnight.  Take these medicines the morning of surgery with A SIP OF WATER: Albuterol inhaler, Xanax, Propanolol   Do not wear jewelry, make-up or nail polish.  Do not wear lotions, powders, or perfumes, or deodorant.  Do not shave 48 hours prior to surgery.  Men may shave face and neck.  Do not bring valuables to the hospital.  [facility] is not responsible for any belongings or valuables.  Contacts, dentures or bridgework may not be worn into surgery.  Leave your suitcase in the car.  After surgery it may be brought to your room.  For patients admitted to the hospital, discharge time will be determined by your treatment team.  Patients discharged the day of surgery will not be allowed to drive home.   Name and phone number of your driver:    Special instructions:    Please read over the following fact sheets that you were given. Anesthesia Post-op Instructions   Partial Mastectomy With or Without Axillary Lymph Node Removal Partial mastectomy with or without axillary lymph node removal is a surgery to remove breast cancer. It is a type of breast-conserving surgery. This means that the cancerous tissue is removed but the breast remains intact. During this procedure, the tumor and a small rim of healthy tissue surrounding it will be removed. Lymph nodes under your arm may also be removed and tested to find out if the cancer has spread. Let your health care provider know about:  Any allergies you have.  All medicines you are taking, including vitamins, herbs, eye drops, creams, and over-the-counter medicines.  Previous problems you or members of your family have had with the use of anesthetics.  Any blood  disorders you have.  Any surgeries you have had.  Any medical conditions you have. What are the risks? Generally, this is a safe procedure. However, problems may occur, including:  A change in the way your breast looks and feels.  Breast pain.  Infection.  Bleeding.  Pain, swelling, weakness, or numbness in the arm on the side of your surgery.  What happens before the procedure?  Ask your health care provider about: ? Changing or stopping your regular medicines. This is especially important if you are taking diabetes medicines or blood thinners. ? Taking medicines such as aspirin and ibuprofen. These medicines can thin your blood. Do not take these medicines before your procedure if your health care provider instructs you not to.  Follow your health care provider's instructions about eating or drinking restrictions.  You may be checked for extra fluid around your lymph nodes (lymphedema). What happens during the procedure?  An IV tube will be inserted into one of your veins.  You will be given a medicine that makes you fall asleep (general anesthetic).  Your surgeon may mark your breast to indicate the location of your tumor and to plan the incision.  Your breast will be cleaned with a germ-killing solution (antiseptic).  Your surgeon will make an incision over the area of your breast where the tumor is located. This will usually be a curved incision that follows the normal shape of your breast.  The tumor will be removed along with  a portion of the tissue that surrounds it.  If the tumor is close to the muscles over your chest, some muscle tissue may also be removed.  The incision may extend to the lymph nodes under your arm, or a second incision may be made under your arm.  Lymph nodes under your arm may be removed.  You may have a drainage tube inserted into your incision to collect fluid that builds up after surgery. This tube will be connected to a suction  bulb.  Your incision or incisions will be closed with stitches (sutures).  A bandage (dressing) will be placed over your breast and under your arm. The procedure may vary among health care providers and hospitals. What happens after the procedure?  Your blood pressure, heart rate, breathing rate, and blood oxygen level will be monitored often until the medicines you were given have worn off.  You will be given pain medicine as needed.  You will be encouraged to get up and walk as soon as you can.  Your IV tube will be removed when you are able to eat and drink.  Your drain may be removed before you go home from the hospital, or you may be sent home with your drain and suction bulb. This information is not intended to replace advice given to you by your health care provider. Make sure you discuss any questions you have with your health care provider. Document Released: 12/05/2014 Document Revised: 03/27/2016 Document Reviewed: 04/05/2014 Elsevier Interactive Patient Education  2018 [place] Anesthesia, Adult, Care After These instructions provide you with information about caring for yourself after your procedure. Your health care provider may also give you more specific instructions. Your treatment has been planned according to current medical practices, but problems sometimes occur. Call your health care provider if you have any problems or questions after your procedure. What can I expect after the procedure? After the procedure, it is common to have:  Vomiting.  A sore throat.  Mental slowness.  It is common to feel:  Nauseous.  Cold or shivery.  Sleepy.  Tired.  Sore or achy, even in parts of your body where you did not have surgery.  Follow these instructions at home: For at least 24 hours after the procedure:  Do not: ? Participate in activities where you could fall or become injured. ? Drive. ? Use heavy machinery. ? Drink alcohol. ? Take  sleeping pills or medicines that cause drowsiness. ? Make important decisions or sign legal documents. ? Take care of children on your own.  Rest. Eating and drinking  If you vomit, drink water, juice, or soup when you can drink without vomiting.  Drink enough fluid to keep your urine clear or pale yellow.  Make sure you have little or no nausea before eating solid foods.  Follow the diet recommended by your health care provider. General instructions  Have a responsible adult stay with you until you are awake and alert.  Return to your normal activities as told by your health care provider. Ask your health care provider what activities are safe for you.  Take over-the-counter and prescription medicines only as told by your health care provider.  If you smoke, do not smoke without supervision.  Keep all follow-up visits as told by your health care provider. This is important. Contact a health care provider if:  You continue to have nausea or vomiting at home, and medicines are not helpful.  You cannot drink fluids  or start eating again.  You cannot urinate after 8-12 hours.  You develop a skin rash.  You have fever.  You have increasing redness at the site of your procedure. Get help right away if:  You have difficulty breathing.  You have chest pain.  You have unexpected bleeding.  You feel that you are having a life-threatening or urgent problem. This information is not intended to replace advice given to you by your health care provider. Make sure you discuss any questions you have with your health care provider. Document Released: 10/27/2000 Document Revised: 12/24/2015 Document Reviewed: 07/05/2015 Elsevier Interactive Patient Education  [employer].

## 2017-07-23 ENCOUNTER — Encounter (HOSPITAL_COMMUNITY)
Admission: RE | Admit: 2017-07-23 | Discharge: 2017-07-23 | Disposition: A | Discharge status: Home / Self Care | Payer: Medicare Other | Source: Ambulatory Visit | Attending: General Surgery | Admitting: General Surgery

## 2017-07-23 ENCOUNTER — Encounter (HOSPITAL_COMMUNITY): Payer: Self-pay

## 2017-07-23 DIAGNOSIS — C50912 Malignant neoplasm of unspecified site of left female breast: Secondary | ICD-10-CM | POA: Diagnosis not present

## 2017-07-23 DIAGNOSIS — I493 Ventricular premature depolarization: Secondary | ICD-10-CM | POA: Insufficient documentation

## 2017-07-23 DIAGNOSIS — Z01810 Encounter for preprocedural cardiovascular examination: Secondary | ICD-10-CM | POA: Insufficient documentation

## 2017-07-23 HISTORY — DX: Malignant (primary) neoplasm, unspecified: C80.1

## 2017-07-23 HISTORY — DX: Benign lipomatous neoplasm of skin and subcutaneous tissue of left arm: D17.22

## 2017-07-23 LAB — BASIC METABOLIC PANEL
Anion gap: 10 (ref 5–15)
BUN: 12 mg/dL (ref 6–20)
CALCIUM: 9.3 mg/dL (ref 8.9–10.3)
CO2: 25 mmol/L (ref 22–32)
CREATININE: 0.71 mg/dL (ref 0.44–1.00)
Chloride: 103 mmol/L (ref 101–111)
GFR calc Af Amer: >60 mL/min (ref >60)
GLUCOSE: 90 mg/dL (ref 65–99)
Potassium: 3.5 mmol/L (ref 3.5–5.1)
Sodium: 138 mmol/L (ref 135–145)

## 2017-07-23 LAB — CBC
HEMATOCRIT: 39.7 % (ref 36.0–46.0)
Hemoglobin: 12.9 g/dL (ref 12.0–15.0)
MCH: 28.9 pg (ref 26.0–34.0)
MCHC: 32.5 g/dL (ref 30.0–36.0)
MCV: 89 fL (ref 78.0–100.0)
PLATELETS: 325 10*3/uL (ref 150–400)
RBC: 4.46 MIL/uL (ref 3.87–5.11)
RDW: 12.6 % (ref 11.5–15.5)
WBC: 7.4 10*3/uL (ref 4.0–10.5)

## 2017-07-24 ENCOUNTER — Other Ambulatory Visit (HOSPITAL_COMMUNITY): Payer: Medicare Other

## 2017-07-30 ENCOUNTER — Encounter: Payer: Self-pay | Admitting: General Surgery

## 2017-07-30 ENCOUNTER — Ambulatory Visit: Payer: Medicare Other | Admitting: Family Medicine

## 2017-07-30 ENCOUNTER — Ambulatory Visit (INDEPENDENT_AMBULATORY_CARE_PROVIDER_SITE_OTHER): Payer: Medicare Other | Admitting: General Surgery

## 2017-07-30 ENCOUNTER — Ambulatory Visit: Payer: Medicare Other | Admitting: General Surgery

## 2017-07-30 VITALS — BP 126/90 | HR 83 | Temp 98.4°F | Resp 18 | Ht 68.0 in | Wt 194.0 lb

## 2017-07-30 DIAGNOSIS — C50912 Malignant neoplasm of unspecified site of left female breast: Secondary | ICD-10-CM

## 2017-07-30 NOTE — Progress Notes (Signed)
[facility] History and Physical  Reason for Referral: Left shoulder lipoma and left breast cancer  Referring Physician: Dr. Hagler  Chief Complaint    Breast Cancer      Mary Wise is a 66 y.o. female.  HPI: Mary Wise is a very pleasant 66 yo who was sent to me initially after starting to see Dr. Hagler for a left shoulder/ arm mass that was have determined to be a lipoma that extended into the quadrilateral space in close proximity to the axillary nerve and vessels.  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  I have seen her twice in the past and have discussed her case with her daughter multiple times, we had opted to see if someone could do her breast cancer surgery in conjunction with the left shoulder/ arm lipoma surgery.  We sent her to see Dr. Murphy, orthopedics in [place], to see if this could be accomplished with the assistance of [facility] for the breast surgery.  Unfortunately, due to the extent of the lipoma, the patient was referred to [place] for excision.  She is planning to undergo the lipoma excision in the future, but they do not want her arm extended following their procedure, which will be necessary for the sentinel node biopsy .  I attempted to get the patient oncology referral to discuss her options for chemotherapy pending need for a port, but she did not get word about this appointment and missed the appointment. At this time, she is very reluctant to undergo any chemotherapy but understands that radiation is necessary for a partial mastectomy.  I have seen her today in clinic to make sure she did not have any further questions prior to her procedure tomorrow, and to make sure that she was comfortable with the plan.   Past Medical History:  Diagnosis Date  . Anxiety   . Cancer (HCC)    left breast  cancer  . Hemorrhoids   . Lipoma of left shoulder   . Migraine   . Migraine   . Seizures (HCC)     Past Surgical History:  Procedure Laterality Date  . ABDOMINAL HYSTERECTOMY    . APPENDECTOMY    . CESAREAN SECTION    . CHOLECYSTECTOMY    . ERCP    . LIVER SURGERY      Family History  Problem Relation Age of Onset  . Cancer Mother   . Cancer Father   . Cancer Sister   . Diabetes Brother   . Cancer Maternal Grandmother   . Cancer Other   . Diabetes Sister   . Diabetes Sister   . Diabetes Sister     Social History   Tobacco Use  . Smoking status: Never Smoker  . Smokeless tobacco: Never Used  Substance Use Topics  . Alcohol use: No  . Drug use: No    Medications: I have reviewed the patient's current medications. Allergies as of 07/30/2017      Reactions   Onion Other (See Comments)   REACTION: results in coma state   Penicillins Hives, Shortness Of Breath, Swelling   Throat swelling Has patient had a PCN reaction causing immediate rash, facial/tongue/throat swelling, SOB or lightheadedness with hypotension: Yes Has patient had a PCN reaction causing severe rash involving mucus membranes or skin necrosis: No Has patient had a PCN reaction   that required hospitalization: Yes Has patient had a PCN reaction occurring within the last 10 years: No If all of the above answers are "NO", then may proceed with Cephalosporin use.   Sulfa Antibiotics Hives, Shortness Of Breath   Yellow Dyes (non-tartrazine)    REACTION: #5 and #9 "will kill me"   Clindamycin/lincomycin Hives, Nausea And Vomiting      Medication List        Accurate as of 07/30/17  9:47 PM. Always use your most recent med list.          albuterol 108 (90 Base) MCG/ACT inhaler Commonly known as:  PROVENTIL HFA;VENTOLIN HFA Inhale 2 puffs into the lungs every 6 (six) hours as needed for wheezing or shortness of breath.   albuterol 108 (90 Base) MCG/ACT inhaler Commonly known as:  PROVENTIL  HFA;VENTOLIN HFA Inhale 1-2 puffs into the lungs every 6 (six) hours as needed for wheezing or shortness of breath.   ALPRAZolam 1 MG tablet Commonly known as:  XANAX Take 1 mg by mouth daily as needed for anxiety.   ARTIFICIAL TEARS OP Apply to eye 2 (two) times daily. Doesn't do drops in eyes does as a wash to flush eyes out   BIOFLEX Tabs Take 1 tablet by mouth daily.   BIOFREEZE EX Apply 1 application topically as needed (pain).   lidocaine 5 % Commonly known as:  LIDODERM Place 0.5-1 patches onto the skin daily as needed (pain). Remove & Discard patch within 12 hours or as directed by MD   MUSCLE RUB 10-15 % Crea Apply 1 application topically as needed for muscle pain.   naproxen 500 MG tablet Commonly known as:  NAPROSYN Take 1 tablet (500 mg total) by mouth 2 (two) times daily as needed (migraine).   propranolol ER 80 MG 24 hr capsule Commonly known as:  INDERAL LA Take 1 capsule (80 mg total) by mouth daily.   traZODone 50 MG tablet Commonly known as:  DESYREL Take 1 tablet (50 mg total) by mouth at bedtime.        ROS:  A comprehensive review of systems was negative except for: Constitutional: positive for headache Respiratory: positive for asthma Gastrointestinal: positive for reflux symptoms Integument/breast: positive for breast tenderness, cancer Musculoskeletal: positive for back pain, bone pain, neck pain, stiff joints and left arm pain, stiffness  Blood pressure 126/90, pulse 83, temperature 98.4 F (36.9 C), resp. rate 18, height 5' 8" (1.727 m), weight 194 lb (88 kg). Physical Exam  Constitutional: She is oriented to person, place, and time and well-developed, well-nourished, and in no distress.  HENT:  Head: Normocephalic.  Eyes: Pupils are equal, round, and reactive to light.  Neck: Normal range of motion.  Cardiovascular: Normal rate and regular rhythm.  Pulmonary/Chest: Breath sounds normal. Left breast exhibits mass and tenderness. Left  breast exhibits no nipple discharge and no skin change.  No left axillary adenopathy  Abdominal: Soft. She exhibits no distension. There is no tenderness.  Musculoskeletal:  Left arm decreased range of motion, otherwise normal, decreased grip in left hand, large lipoma over deltoid region  Neurological: She is alert and oriented to person, place, and time.  Skin: Skin is warm and dry.  Psychiatric: Mood, memory, affect and judgment normal.  Vitals reviewed.   Results: US Left arm- reviewed with radiologist, difficult to say if the lesion interdigitates with the muscle or extends into the muscle IMPRESSION: 13.8 x 10 x 13.1 cm homogeneous mass overlying the left shoulder most   consistent with a lipoma.  US/ Mammogram- left breast mass highly suspicious, left upper outer quadrant, 3X3.2X2 cm at 2 oclock from nipple   MRI left shoulder - IMPRESSION: 1. 9.3 x 9.4 x 12.4 cm simple intramuscular lipoma of the left deltoid muscle extending towards the quadrilateral space. 2. Moderate tendinosis of the supraspinatus tendon with a small partial-thickness articular surface tear.  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 Microscopic Comment The carcinoma appears grade III. ER/ PR/ Her2 negative  Assessment & Plan:  Mary Wise is a 66 y.o. female with a Triple Negative left breast cancer that measures 3cm, making her a clinical T3N0, Stage IIIa.  I have discussed it with oncology and given the size and hormone status she would qualify for post operative chemotherapy.  I do not think she needs neoadjuvant because she has sufficient breast tissue to do a partial mastectomy.  Oncology aware the patient needs to see them and missed her appointment.  -OR for partial mastectomy and sentinel node after radioactive tracer injection/ methyline blue  -Discussed risk and benefits of surgery including, bleeding, infection, need for possible drain,  need for further surgery if unable to resect all the specimen, possibility of lymphedema and possible need for additional lymph node resection, likely recommendation for chemotherapy, and need for radiation given the partial mastectomy -Will refer to Oncology again following surgery  All questions were answered to the satisfaction of the patient and family.   Mary Wise 07/30/2017, 9:47 PM   

## 2017-07-30 NOTE — H&P (Signed)
[facility] History and Physical  Reason for Referral: Left shoulder lipoma and left breast cancer  Referring Physician: Dr. Mannie Stabile  Chief Complaint    Breast Cancer      Mary Wise is a 66 y.o. female.  HPI: Mary Wise is a very pleasant 66 yo who was sent to me initially after starting to see Dr. Mannie Stabile for a left shoulder/ arm mass that was have determined to be a lipoma that extended into Mary quadrilateral space in close proximity to Mary axillary nerve and vessels.  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  I have seen Mary Wise twice in Mary past and have discussed Mary Wise case with Mary Wise daughter multiple times, we had opted to see if someone could do Mary Wise breast cancer surgery in conjunction with Mary left shoulder/ arm lipoma surgery.  We sent Mary Wise to see Dr. Percell Miller, orthopedics in [place], to see if this could be accomplished with Mary assistance of [facility] for Mary breast surgery.  Unfortunately, due to Mary extent of Mary lipoma, Mary Wise was referred to [facility] for excision.  Mary Wise is planning to undergo Mary lipoma excision in Mary future, but they do not want Mary Wise arm extended following their procedure, which will be necessary for Mary sentinel node biopsy .  I attempted to get Mary Wise oncology referral to discuss Mary Wise options for chemotherapy pending need for a port, but Mary Wise did not get word about this appointment and missed Mary appointment. At this time, Mary Wise is very reluctant to undergo any chemotherapy but understands that radiation is necessary for a partial mastectomy.  I have seen Mary Wise today in clinic to make sure Mary Wise did not have any further questions prior to Mary Wise procedure tomorrow, and to make sure that Mary Wise was comfortable with Mary plan.   Past Medical History:  Diagnosis Date  . Anxiety   . Cancer [facility])    left breast  cancer  . Hemorrhoids   . Lipoma of left shoulder   . Migraine   . Migraine   . Seizures ([place])     Past Surgical History:  Procedure Laterality Date  . ABDOMINAL HYSTERECTOMY    . APPENDECTOMY    . CESAREAN SECTION    . CHOLECYSTECTOMY    . ERCP    . LIVER SURGERY      Family History  Problem Relation Age of Onset  . Cancer Mother   . Cancer Father   . Cancer Sister   . Diabetes Brother   . Cancer Maternal Grandmother   . Cancer Other   . Diabetes Sister   . Diabetes Sister   . Diabetes Sister     Social History   Tobacco Use  . Smoking status: Never Smoker  . Smokeless tobacco: Never Used  Substance Use Topics  . Alcohol use: No  . Drug use: No    Medications: I have reviewed Mary Wise's current medications. Allergies as of 07/30/2017      Reactions   Onion Other (See Comments)   REACTION: results in coma state   Penicillins Hives, Shortness Of Breath, Swelling   Throat swelling Has Wise had a PCN reaction causing immediate rash, facial/tongue/throat swelling, SOB or lightheadedness with hypotension: Yes Has Wise had a PCN reaction causing severe rash involving mucus membranes or skin necrosis: No Has Wise had a PCN reaction  that required hospitalization: Yes Has Wise had a PCN reaction occurring within Mary last 10 years: No If all of Mary above answers are "NO", then may proceed with Cephalosporin use.   Sulfa Antibiotics Hives, Shortness Of Breath   Yellow Dyes (non-tartrazine)    REACTION: #5 and #9 "will kill me"   Clindamycin/lincomycin Hives, Nausea And Vomiting      Medication List        Accurate as of 07/30/17  9:47 PM. Always use your most recent med list.          albuterol 108 (90 Base) MCG/ACT inhaler Commonly known as:  PROVENTIL HFA;VENTOLIN HFA Inhale 2 puffs into Mary lungs every 6 (six) hours as needed for wheezing or shortness of breath.   albuterol 108 (90 Base) MCG/ACT inhaler Commonly known as:  PROVENTIL  HFA;VENTOLIN HFA Inhale 1-2 puffs into Mary lungs every 6 (six) hours as needed for wheezing or shortness of breath.   ALPRAZolam 1 MG tablet Commonly known as:  XANAX Take 1 mg by mouth daily as needed for anxiety.   ARTIFICIAL TEARS OP Apply to eye 2 (two) times daily. Doesn't do drops in eyes does as a wash to flush eyes out   BIOFLEX Tabs Take 1 tablet by mouth daily.   BIOFREEZE EX Apply 1 application topically as needed (pain).   lidocaine 5 % Commonly known as:  LIDODERM Place 0.5-1 patches onto Mary skin daily as needed (pain). Remove & Discard patch within 12 hours or as directed by MD   MUSCLE RUB 10-15 % Crea Apply 1 application topically as needed for muscle pain.   naproxen 500 MG tablet Commonly known as:  NAPROSYN Take 1 tablet (500 mg total) by mouth 2 (two) times daily as needed (migraine).   propranolol ER 80 MG 24 hr capsule Commonly known as:  INDERAL LA Take 1 capsule (80 mg total) by mouth daily.   traZODone 50 MG tablet Commonly known as:  DESYREL Take 1 tablet (50 mg total) by mouth at bedtime.        ROS:  A comprehensive review of systems was negative except for: Constitutional: positive for headache Respiratory: positive for asthma Gastrointestinal: positive for reflux symptoms Integument/breast: positive for breast tenderness, cancer Musculoskeletal: positive for back pain, bone pain, neck pain, stiff joints and left arm pain, stiffness  Blood pressure 126/90, pulse 83, temperature 98.4 F (36.9 C), resp. rate 18, height _0  (1.727 m), weight 194 lb (88 kg). Physical Exam  Constitutional: Mary Wise is oriented to person, place, and time and well-developed, well-nourished, and in no distress.  HENT:  Head: Normocephalic.  Eyes: Pupils are equal, round, and reactive to light.  Neck: Normal range of motion.  Cardiovascular: Normal rate and regular rhythm.  Pulmonary/Chest: Breath sounds normal. Left breast exhibits mass and tenderness. Left  breast exhibits no nipple discharge and no skin change.  No left axillary adenopathy  Abdominal: Soft. Mary Wise exhibits no distension. There is no tenderness.  Musculoskeletal:  Left arm decreased range of motion, otherwise normal, decreased grip in left hand, large lipoma over deltoid region  Neurological: Mary Wise is alert and oriented to person, place, and time.  Skin: Skin is warm and dry.  Psychiatric: Mood, memory, affect and judgment normal.  Vitals reviewed.   Results: [place] Left arm- reviewed with radiologist, difficult to say if Mary lesion interdigitates with Mary muscle or extends into Mary muscle IMPRESSION: 13.8 x 10 x 13.1 cm homogeneous mass overlying Mary left shoulder most  consistent with a lipoma.  US/ Mammogram- left breast mass highly suspicious, left upper outer quadrant, 3X3.2X2 cm at 2 oclock from nipple   MRI left shoulder - IMPRESSION: 1. 9.3 x 9.4 x 12.4 cm simple intramuscular lipoma of Mary left deltoid muscle extending towards Mary quadrilateral space. 2. Moderate tendinosis of Mary supraspinatus tendon with a small partial-thickness articular surface tear.  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 Microscopic Comment Mary carcinoma appears grade III. ER/ PR/ Her2 negative  Assessment & Plan:  Mary Wise is a 66 y.o. female with a Triple Negative left breast cancer that measures 3cm, making Mary Wise a clinical T3N0, Stage IIIa.  I have discussed it with oncology and given Mary size and hormone status Mary Wise would qualify for post operative chemotherapy.  I do not think Mary Wise needs neoadjuvant because Mary Wise has sufficient breast tissue to do a partial mastectomy.  Oncology aware Mary Wise needs to see them and missed Mary Wise appointment.  -OR for partial mastectomy and sentinel node after radioactive tracer injection/ methyline blue  -Discussed risk and benefits of surgery including, bleeding, infection, need for possible drain,  need for further surgery if unable to resect all Mary specimen, possibility of lymphedema and possible need for additional lymph node resection, likely recommendation for chemotherapy, and need for radiation given Mary partial mastectomy -Will refer to Oncology again following surgery  All questions were answered to Mary satisfaction of Mary Wise and family.   Virl Cagey 07/30/2017, 9:47 PM

## 2017-07-31 ENCOUNTER — Ambulatory Visit (HOSPITAL_COMMUNITY): Payer: Medicare Other | Admitting: Anesthesiology

## 2017-07-31 ENCOUNTER — Encounter (HOSPITAL_COMMUNITY)
Admission: RE | Disposition: A | Discharge status: Home / Self Care | Payer: Self-pay | Source: Ambulatory Visit | Attending: General Surgery

## 2017-07-31 ENCOUNTER — Ambulatory Visit (HOSPITAL_COMMUNITY)
Admission: RE | Admit: 2017-07-31 | Discharge: 2017-07-31 | Disposition: A | Discharge status: Home / Self Care | Payer: Medicare Other | Source: Ambulatory Visit | Attending: General Surgery | Admitting: General Surgery

## 2017-07-31 ENCOUNTER — Encounter (HOSPITAL_COMMUNITY): Payer: Self-pay | Admitting: *Deleted

## 2017-07-31 DIAGNOSIS — C50412 Malignant neoplasm of upper-outer quadrant of left female breast: Secondary | ICD-10-CM | POA: Diagnosis not present

## 2017-07-31 DIAGNOSIS — K219 Gastro-esophageal reflux disease without esophagitis: Secondary | ICD-10-CM | POA: Diagnosis not present

## 2017-07-31 DIAGNOSIS — Z79899 Other long term (current) drug therapy: Secondary | ICD-10-CM | POA: Insufficient documentation

## 2017-07-31 DIAGNOSIS — Z171 Estrogen receptor negative status [ER-]: Secondary | ICD-10-CM | POA: Insufficient documentation

## 2017-07-31 DIAGNOSIS — D1722 Benign lipomatous neoplasm of skin and subcutaneous tissue of left arm: Secondary | ICD-10-CM | POA: Diagnosis not present

## 2017-07-31 DIAGNOSIS — C50912 Malignant neoplasm of unspecified site of left female breast: Secondary | ICD-10-CM

## 2017-07-31 DIAGNOSIS — F419 Anxiety disorder, unspecified: Secondary | ICD-10-CM | POA: Diagnosis not present

## 2017-07-31 DIAGNOSIS — Z880 Allergy status to penicillin: Secondary | ICD-10-CM | POA: Diagnosis not present

## 2017-07-31 HISTORY — PX: PARTIAL MASTECTOMY WITH AXILLARY SENTINEL LYMPH NODE BIOPSY: SHX6004

## 2017-07-31 SURGERY — PARTIAL MASTECTOMY WITH AXILLARY SENTINEL LYMPH NODE BIOPSY
Anesthesia: General | Laterality: Left

## 2017-07-31 MED ORDER — SODIUM CHLORIDE 0.9 % IJ SOLN: INTRAVENOUS | Status: DC | PRN

## 2017-07-31 MED ORDER — VANCOMYCIN HCL IN DEXTROSE 1-5 GM/200ML-% IV SOLN
1000.0000 mg | INTRAVENOUS | Status: AC
  Administered 2017-07-31: 1000 mg via INTRAVENOUS
  Filled 2017-07-31: qty 200

## 2017-07-31 MED ORDER — PHENYLEPHRINE 40 MCG/ML (10ML) SYRINGE FOR IV PUSH (FOR BLOOD PRESSURE SUPPORT)
PREFILLED_SYRINGE | INTRAVENOUS | Status: AC
  Filled 2017-07-31: qty 10

## 2017-07-31 MED ORDER — EPHEDRINE SULFATE 50 MG/ML IJ SOLN
INTRAMUSCULAR | Status: DC | PRN
  Administered 2017-07-31 (×2): 10 mg via INTRAVENOUS

## 2017-07-31 MED ORDER — PENTAFLUOROPROP-TETRAFLUOROETH EX AERO
INHALATION_SPRAY | CUTANEOUS | Status: AC
  Filled 2017-07-31: qty 103.5

## 2017-07-31 MED ORDER — SUCCINYLCHOLINE CHLORIDE 20 MG/ML IJ SOLN
INTRAMUSCULAR | Status: DC | PRN
  Administered 2017-07-31: 140 mg via INTRAVENOUS

## 2017-07-31 MED ORDER — SODIUM CHLORIDE 0.9 % IJ SOLN
INTRAMUSCULAR | Status: AC
  Filled 2017-07-31: qty 10

## 2017-07-31 MED ORDER — HYDROMORPHONE HCL 1 MG/ML IJ SOLN
0.2500 mg | INTRAMUSCULAR | Status: DC | PRN
  Administered 2017-07-31: 0.5 mg via INTRAVENOUS
  Filled 2017-07-31: qty 1

## 2017-07-31 MED ORDER — ONDANSETRON HCL 4 MG/2ML IJ SOLN
4.0000 mg | Freq: Once | INTRAMUSCULAR | Status: AC
  Administered 2017-07-31: 4 mg via INTRAVENOUS

## 2017-07-31 MED ORDER — TECHNETIUM TC 99M SULFUR COLLOID FILTERED
0.5000 | Freq: Once | INTRAVENOUS | Status: AC | PRN
  Administered 2017-07-31: 0.58 via INTRADERMAL

## 2017-07-31 MED ORDER — ROCURONIUM BROMIDE 50 MG/5ML IV SOLN
INTRAVENOUS | Status: AC
  Filled 2017-07-31: qty 1

## 2017-07-31 MED ORDER — FENTANYL CITRATE (PF) 100 MCG/2ML IJ SOLN
INTRAMUSCULAR | Status: DC | PRN
  Administered 2017-07-31 (×5): 50 ug via INTRAVENOUS

## 2017-07-31 MED ORDER — BUPIVACAINE HCL (PF) 0.5 % IJ SOLN
INTRAMUSCULAR | Status: DC | PRN
  Administered 2017-07-31: 20 mL

## 2017-07-31 MED ORDER — LIDOCAINE HCL (CARDIAC) 20 MG/ML IV SOLN
INTRAVENOUS | Status: DC | PRN
  Administered 2017-07-31: 40 mg via INTRAVENOUS

## 2017-07-31 MED ORDER — ROCURONIUM BROMIDE 100 MG/10ML IV SOLN
INTRAVENOUS | Status: DC | PRN
  Administered 2017-07-31: 15 mg via INTRAVENOUS
  Administered 2017-07-31: 10 mg via INTRAVENOUS

## 2017-07-31 MED ORDER — SUGAMMADEX SODIUM 500 MG/5ML IV SOLN
INTRAVENOUS | Status: AC
  Filled 2017-07-31: qty 5

## 2017-07-31 MED ORDER — 0.9 % SODIUM CHLORIDE (POUR BTL) OPTIME
TOPICAL | Status: DC | PRN
  Administered 2017-07-31: 1000 mL

## 2017-07-31 MED ORDER — LIDOCAINE HCL (PF) 1 % IJ SOLN
INTRAMUSCULAR | Status: AC
  Filled 2017-07-31: qty 5

## 2017-07-31 MED ORDER — DEXTROSE 5 % IV SOLN
INTRAVENOUS | Status: DC | PRN
  Administered 2017-07-31: 09:00:00 via INTRAVENOUS

## 2017-07-31 MED ORDER — TECHNETIUM TC 99M SULFUR COLLOID FILTERED
0.5000 | Freq: Once | INTRAVENOUS | Status: AC | PRN
  Administered 2017-07-31: 0.49 via INTRADERMAL

## 2017-07-31 MED ORDER — CHLORHEXIDINE GLUCONATE CLOTH 2 % EX PADS: 6.0000 | MEDICATED_PAD | Freq: Once | CUTANEOUS | Status: DC

## 2017-07-31 MED ORDER — OXYCODONE HCL 5 MG PO TABS: 5.0000 mg | ORAL_TABLET | ORAL | 0 refills | Status: DC | PRN

## 2017-07-31 MED ORDER — LACTATED RINGERS IV SOLN
INTRAVENOUS | Status: DC
  Administered 2017-07-31: 08:00:00 via INTRAVENOUS

## 2017-07-31 MED ORDER — SUGAMMADEX SODIUM 500 MG/5ML IV SOLN
INTRAVENOUS | Status: DC | PRN
  Administered 2017-07-31: 176 mg via INTRAVENOUS

## 2017-07-31 MED ORDER — PROPOFOL 10 MG/ML IV BOLUS
INTRAVENOUS | Status: DC | PRN
  Administered 2017-07-31: 150 mg via INTRAVENOUS
  Administered 2017-07-31: 20 mg via INTRAVENOUS

## 2017-07-31 MED ORDER — PROPOFOL 10 MG/ML IV BOLUS
INTRAVENOUS | Status: AC
  Filled 2017-07-31: qty 20

## 2017-07-31 MED ORDER — FENTANYL CITRATE (PF) 100 MCG/2ML IJ SOLN
25.0000 ug | INTRAMUSCULAR | Status: DC | PRN
  Administered 2017-07-31 (×3): 50 ug via INTRAVENOUS
  Filled 2017-07-31 (×2): qty 2

## 2017-07-31 MED ORDER — ONDANSETRON HCL 4 MG/2ML IJ SOLN
INTRAMUSCULAR | Status: AC
  Filled 2017-07-31: qty 2

## 2017-07-31 MED ORDER — METHYLENE BLUE 0.5 % INJ SOLN
INTRAVENOUS | Status: AC
  Filled 2017-07-31: qty 10

## 2017-07-31 MED ORDER — SODIUM CHLORIDE 0.9 % IJ SOLN
INTRAVENOUS | Status: DC | PRN
  Administered 2017-07-31: 2 mL

## 2017-07-31 MED ORDER — BUPIVACAINE HCL (PF) 0.5 % IJ SOLN
INTRAMUSCULAR | Status: AC
  Filled 2017-07-31: qty 30

## 2017-07-31 MED ORDER — MIDAZOLAM HCL 2 MG/2ML IJ SOLN
1.0000 mg | INTRAMUSCULAR | Status: AC
  Administered 2017-07-31: 2 mg via INTRAVENOUS

## 2017-07-31 MED ORDER — FENTANYL CITRATE (PF) 250 MCG/5ML IJ SOLN
INTRAMUSCULAR | Status: AC
  Filled 2017-07-31: qty 5

## 2017-07-31 MED ORDER — EPHEDRINE SULFATE 50 MG/ML IJ SOLN
INTRAMUSCULAR | Status: AC
  Filled 2017-07-31: qty 1

## 2017-07-31 MED ORDER — MIDAZOLAM HCL 2 MG/2ML IJ SOLN
INTRAMUSCULAR | Status: AC
  Filled 2017-07-31: qty 2

## 2017-07-31 SURGICAL SUPPLY — 38 items
ADH SKN CLS APL DERMABOND .7 (GAUZE/BANDAGES/DRESSINGS) ×2
APPLIER CLIP 9.375 SM OPEN (CLIP) ×6
APR CLP SM 9.3 20 MLT OPN (CLIP) ×2
BAG HAMPER (MISCELLANEOUS) ×3 IMPLANT
BLADE SURG 15 STRL LF DISP TIS (BLADE) ×1 IMPLANT
BLADE SURG 15 STRL SS (BLADE) ×3
CHLORAPREP W/TINT 26ML (MISCELLANEOUS) ×2 IMPLANT
CLIP APPLIE 9.375 SM OPEN (CLIP) ×1 IMPLANT
COVER PROBE W GEL 5X96 (DRAPES) ×3 IMPLANT
COVER SURGICAL LIGHT HANDLE (MISCELLANEOUS) ×3 IMPLANT
DECANTER SPIKE VIAL GLASS SM (MISCELLANEOUS) ×3 IMPLANT
DERMABOND ADVANCED (GAUZE/BANDAGES/DRESSINGS) ×4
DERMABOND ADVANCED .7 DNX12 (GAUZE/BANDAGES/DRESSINGS) ×1 IMPLANT
ELECT REM PT RETURN 9FT ADLT (ELECTROSURGICAL) ×3
ELECTRODE REM PT RTRN 9FT ADLT (ELECTROSURGICAL) ×1 IMPLANT
GAUZE SPONGE 4X4 16PLY XRAY LF (GAUZE/BANDAGES/DRESSINGS) ×2 IMPLANT
GLOVE BIO SURGEON STRL SZ 6.5 (GLOVE) ×2 IMPLANT
GLOVE BIO SURGEONS STRL SZ 6.5 (GLOVE) ×1
GLOVE BIOGEL PI IND STRL 6.5 (GLOVE) ×1 IMPLANT
GLOVE BIOGEL PI IND STRL 7.0 (GLOVE) ×1 IMPLANT
GLOVE BIOGEL PI INDICATOR 6.5 (GLOVE) ×2
GLOVE BIOGEL PI INDICATOR 7.0 (GLOVE) ×2
GOWN STRL REUS W/ TWL LRG LVL3 (GOWN DISPOSABLE) ×2 IMPLANT
GOWN STRL REUS W/TWL LRG LVL3 (GOWN DISPOSABLE) ×6
INST SET MINOR GENERAL (KITS) ×3 IMPLANT
KIT ROOM TURNOVER APOR (KITS) ×3 IMPLANT
NDL HYPO 25X1 1.5 SAFETY (NEEDLE) IMPLANT
NEEDLE HYPO 25X1 1.5 SAFETY (NEEDLE) ×6 IMPLANT
NS IRRIG 1000ML POUR BTL (IV SOLUTION) ×3 IMPLANT
PACK MINOR (CUSTOM PROCEDURE TRAY) ×3 IMPLANT
PAD ARMBOARD 7.5X6 YLW CONV (MISCELLANEOUS) ×6 IMPLANT
SET BASIN LINEN APH (SET/KITS/TRAYS/PACK) ×3 IMPLANT
SUT MNCRL AB 4-0 PS2 18 (SUTURE) ×4 IMPLANT
SUT SILK 2 0 SH (SUTURE) ×3 IMPLANT
SUT VIC AB 3-0 SH 27 (SUTURE) ×9
SUT VIC AB 3-0 SH 27X BRD (SUTURE) ×1 IMPLANT
SYR BULB IRRIGATION 50ML (SYRINGE) ×2 IMPLANT
SYR CONTROL 10ML LL (SYRINGE) ×4 IMPLANT

## 2017-07-31 NOTE — Interval H&P Note (Signed)
History and Physical Interval Note:  07/31/2017 8:51 AM  Mary Wise  has presented today for surgery, with the diagnosis of left breast cancer  The various methods of treatment have been discussed with the patient and family. After consideration of risks, benefits and other options for treatment, the patient has consented to  Procedure(s) with comments: PARTIAL MASTECTOMY WITH AXILLARY SENTINEL LYMPH NODE BIOPSY (Left) - Sentinel Node Injection @ 7:30am as a surgical intervention .  The patient's history has been reviewed, patient examined, no change in status, stable for surgery.  I have reviewed the patient's chart and labs.  Questions were answered to the patient's satisfaction.    No additional questions. Tolerated radiotracer around the nipple. Marked left side. With daughter and Sister.   Virl Cagey

## 2017-07-31 NOTE — Op Note (Signed)
[facility] Operative Note  07/31/17  Preoperative Diagnosis: Left Breast Cancer    Postoperative Diagnosis: Same   Procedure(s) Performed: Partial mastectomy with sentinel lymph node biopsy following Radiotracer injection by Radiology    Surgeon: Lanell Matar. Constance Haw, MD   Assistants: No qualified resident was available   Anesthesia: General endotracheal   Anesthesiologist: Lerry Liner, MD    Specimens:   1.) Sentinel Nodes 2.) Left Breast Cancer (short superior suture, long lateral suture)  3-7.) Superior, Inferior, Deep, Lateral, and Medial margins    Estimated Blood Loss: Minimal   Blood Replacement: None    Complications: None   Wound Class: Clean    Operative Indications: Ms. Mostek is a 66 yo who recently was seen by primary care and evaluated and found to have a palpable breast mass that was biopsied and showed triple negative breast mass. She also has a large lipoma extending into her quadrilateral space on the left, and I have seen her in the office a few times trying to get her care established for possibly removing the lipoma and breast cancer at the same time. She has since been referred to [facility] to have the lipoma removed, and will not be able to have a combined procedure. Given this, I discussed the risk and benefits of partial mastectomy and sentinel lymph node biopsy with her and her family, including but not limited to bleeding, infection, need for repeat surgery, and the need for radiation following the procedure. We also discussed possible chemotherapy and the option of chemotherapy prior, but she is very hesitant to get chemotherapy. Given the size of her breast and the lesion at about 3cm without clinically evident nodes, I think it is reasonable to perform a partial mastectomy and sentinel node.  She has opted to proceed.    Findings:  Sentinel lymph node that was blue with counts to 1800; with additional lymph nodes in packet that  were not blue or hot. Additional lymph node deep to packet, measuring 10% of prior at about 170-180, not blue, slightly enlarged    Procedure: The patient was taken to the operating room and placed supine with her left arm extended and padded. General endotracheal anesthesia was induced. Intravenous antibiotics were administered per protocol.   Prior to coming to the OR, the patient had been seen in radiology for radiotracer injection around her nipple. After induction and appropriate time out, I injected about 56mL of methylene blood into four quadrant around the nipple. This was massaged for 5 minutes. The left chest and axilla were prepped and draped in the normal sterile fashion.  A repeat time out was performed verifying the correct patient, procedure, site, position, implants, equipment, and specimen plan.    A handheld gamma probe was used to identify the location of the hottest spot on the axilla. Prior to incision the counts were 1400.  An incision was made transversely below the hairline and carried down through the subcutaneous tissue to the clavopectorial fascia.  The probe was placed and a counts read approximately 1200-1400. Using an Allis, a blue node was elevated from the field and dissected out using a combination of cautery and clips for control of bleeding. Additional fatty tissue surrounding the node was excised on after excision I noted at least 1-2 additional small lymph nodes without blue dye.  The blue node had counts of 1800 outside the body. The gamma probe was then placed back into the axilla and an additional hot spot measuring  about 170-180 was appreciated. This was carefully dissected out and elevated into the field with an Allis in the same technique as prior.  This node was slightly enlarged but had no blue dye. Given the abnormal appearance, it was removed and sent with the prior specimen.  No additional hot spots were identified.  No additional blue lymphatics were identified.   No clinically abnormal nodes were palpated. The area was irrigated. It was noted to be hemostatic and a damp gauze was placed into the axilla.    Attention was then turn to the palpable mass which was on the edge and lateral portion of the left breast.  This mass was about 3cm in size and close to the skin level.  An elliptical incision was made removing a small portion of skin in closest proximity to the cancer.  This incision was made over a natural skin crease on the edge of the breast.  Flaps were created and the palpable mass was resected intact and oriented with a short superior and long lateral suture.  No additional masses were palpated. Following this, representative margins were obtained from the superior, inferior, medial, lateral, and deep margins.  At the deepest level was the lateral edge of the pectoralis muscle and tissue was taken just later to this. There was no invasion of the chest wall or muscle noted. The cavity was irrigated and hemostasis was obtained.   The axilla was closed with a 3-0 interrupted Vicryl suture and a subcuticular 4-0 Monocryl and dermabond.  No drain was placed as this was not a full axillary dissection.  The partial mastectomy wound was closed in a similar fashion and no attempt was made to close the dead space.    ll counts were correct at the end of the case. The patient was awakened from anesthesia and extubate without complication.  The patient went to the PACU in stable condition.   Curlene Labrum, MD [facility] [address] [place], [place] [id number] [phone] (office)

## 2017-07-31 NOTE — Discharge Instructions (Signed)
Discharge Instructions: Shower per your regular routine. Take tylenol and ibuprofen as needed for pain control, alternating every 4-6 hours.  Take Roxicodone for breakthrough pain. Take colace for constipation related to narcotic pain medication. Do not pick at the dermabond glue on your incision sites.   Sentinel Lymph Node Biopsy, Care After Refer to this sheet in the next few weeks. These instructions provide you with information on caring for yourself after your procedure. Your health care provider may also give you more specific instructions. Your treatment has been planned according to current medical practices, but problems sometimes occur. Call your health care provider if you have any problems or questions after your procedure. What can I expect after the procedure? After your procedure, it is typical to have the following:  Your urine may be blue for the next 24 hours. This is normal. It is caused by the dye used during the procedure.  Your skin at the injection site may be blue for up to 8 weeks.  You may feel numbness, tingling, or pain near your surgical incision.  You may have swelling or bruising near your incision.  Follow these instructions at home:  Avoid vigorous exercise. Ask your health care provider when you can return to your normal activities.  You may shower 24 hours after your procedure. It is okay to get your incision wet. Pat the area dry with a clean towel. Do not rub the incision because this may cause bleeding.  Women who are given a surgical bra should wear it for the next 48 hours. The bra may be removed to shower.  There are many different ways to close and cover an incision, including stitches, skin glue, and adhesive strips. Follow your health care provider's instructions on: ? Incision care. ? Bandage (dressing) changes and removal. ? Incision closure removal.  Take medicines only as directed by your health care provider.  You may resume your  regular diet.  Do not have your blood pressure taken in the arm on the side of the biopsy until your health care provider says it is okay.  Keep all follow-up visits as directed by your health care provider. This is important. Contact a health care provider if:  Your pain medicine is not helping.  You have nausea and vomiting.  You have any new swelling, bruising, or redness.  You have chills or a fever. Get help right away if:  You have pain that is getting worse, and your medicine is not helping.  You have redness, swelling, or tenderness that is getting worse.  You have bleeding or drainage from your incision site.  You have vomiting that will not stop.  You have chest pain or trouble breathing. This information is not intended to replace advice given to you by your health care provider. Make sure you discuss any questions you have with your health care provider. Document Released: 03/04/2004 Document Revised: 12/27/2015 Document Reviewed: 09/09/2013 Elsevier Interactive Patient Education  2018 [place] After This sheet gives you information about how to care for yourself after your procedure. Your health care provider may also give you more specific instructions. If you have problems or questions, contact your health care provider. What can I expect after the procedure? After the procedure, it is common to have:  Breast swelling.  Breast tenderness.  Stiffness in your arm or shoulder.  A change in the shape and feel of your breast.  Scar tissue that feels hard to the touch in  the area where the lump was removed.  Follow these instructions at home: Bathing  Take sponge baths until your health care provider says that you can start showering or bathing.  Do not take baths, swim, or use a hot tub until your health care provider approves. Incision care  Follow instructions from your health care provider about how to take care of your  incision. Make sure you: ? Wash your hands with soap and water before you change your bandage (dressing). If soap and water are not available, use hand sanitizer. ? Change your dressing as told by your health care provider. ? Leave stitches (sutures), skin glue, or adhesive strips in place. These skin closures may need to stay in place for 2 weeks or longer. If adhesive strip edges start to loosen and curl up, you may trim the loose edges. Do not remove adhesive strips completely unless your health care provider tells you to do that.  Check your incision area every day for signs of infection. Check for: ? More redness, swelling, or pain. ? More fluid or blood. ? Warmth. ? Pus or a bad smell.  Keep your dressing clean and dry.  If you were sent home with a surgical drain in place, follow instructions from your health care provider about emptying it. Activity  Return to your normal activities as told by your health care provider. Ask your health care provider what activities are safe for you.  Avoid activities that require a lot of energy (are strenuous).  Be careful to avoid any activities that could cause an injury to your arm on the side of your surgery.  Do not lift anything that is heavier than 10 lb (4.5 kg). Avoid lifting with the arm that is on the side of your surgery.  Do not carry heavy objects on your shoulder.  After your drain is removed, you should perform exercises to keep your arm from getting stiff and swollen. Talk with your health care provider about which exercises are safe for you. General instructions  Take over-the-counter and prescription medicines only as told by your health care provider.  You may eat what you usually do.  Wear a supportive bra as told by your health care provider.  Raise (elevate) your arm above the level of your heart while you are sitting or lying down.  Do not wear tight jewelry on your arm, wrist, or fingers on the side of your  surgery. Follow-up  Keep all follow-up visits as told by your health care provider. This is important.  You may need to be screened for extra fluid around the lymph nodes (lymphedema). Follow instructions from your health care provider about how often you should be checked.  If you had any lymph nodes removed during your procedure, be sure to tell all of your health care providers. This is important information to share before you are involved in certain procedures, such as giving blood or having your blood pressure taken. Contact a health care provider if:  You develop a rash.  You have a fever.  Your pain medicine is not working.  Your swelling, weakness, or numbness in your arm has not improved after a few weeks.  You have new swelling in your breast or arm.  You have more redness, swelling, or pain in your incision area.  You have more fluid or blood coming from your incision.  Your incision feels warm to the touch.  You have pus or a bad smell coming from your  incision. Get help right away if:  You have very bad pain in your breast or arm.  You have chest pain.  You have difficulty breathing. This information is not intended to replace advice given to you by your health care provider. Make sure you discuss any questions you have with your health care provider. Document Released: 08/06/2006 Document Revised: 04/02/2016 Document Reviewed: 04/02/2016 Elsevier Interactive Patient Education  2018 [place] POST-ANESTHESIA  IMMEDIATELY FOLLOWING SURGERY:  Do not drive or operate machinery for the first twenty four hours after surgery.  Do not make any important decisions for twenty four hours after surgery or while taking narcotic pain medications or sedatives.  If you develop intractable nausea and vomiting or a severe headache please notify your doctor immediately.  FOLLOW-UP:  Please make an appointment with your surgeon as instructed. You do  not need to follow up with anesthesia unless specifically instructed to do so.  WOUND CARE INSTRUCTIONS (if applicable):  Keep a dry clean dressing on the anesthesia/puncture wound site if there is drainage.  Once the wound has quit draining you may leave it open to air.  Generally you should leave the bandage intact for twenty four hours unless there is drainage.  If the epidural site drains for more than 36-48 hours please call the anesthesia department.  QUESTIONS?:  Please feel free to call your physician or the hospital operator if you have any questions, and they will be happy to assist you.

## 2017-07-31 NOTE — Brief Op Note (Signed)
07/31/2017  11:50 AM  PATIENT:  Mary Wise  66 y.o. female  PRE-OPERATIVE DIAGNOSIS:  left breast cancer  POST-OPERATIVE DIAGNOSIS:  left breast cancer  PROCEDURE:  Procedure(s): LEFT PARTIAL MASTECTOMY WITH AXILLARY SENTINEL LYMPH NODE BIOPSY (Left)  SURGEON:  Surgeon(s) and Role:    * Dezaray Shibuya, Lanell Matar, MD - Primary  ANESTHESIA:   general  EBL:  25 mL   BLOOD ADMINISTERED:none  DRAINS: none   LOCAL MEDICATIONS USED:  BUPIVICAINE   SPECIMEN:  Lumpectomy with SLN and representative margins   DISPOSITION OF SPECIMEN:  PATHOLOGY  COUNTS:  YES

## 2017-07-31 NOTE — Anesthesia Procedure Notes (Signed)
Procedure Name: Intubation Date/Time: 07/31/2017 9:38 AM Performed by: Andree Elk, Amy A, CRNA Pre-anesthesia Checklist: Patient identified, Timeout performed, Emergency Drugs available, Suction available and Patient being monitored Patient Re-evaluated:Patient Re-evaluated prior to induction Oxygen Delivery Method: Circle system utilized Preoxygenation: Pre-oxygenation with 100% oxygen Induction Type: IV induction, Rapid sequence and Cricoid Pressure applied Laryngoscope Size: Mac and 3 Grade View: Grade I Tube type: Oral Number of attempts: 1 Placement Confirmation: ETT inserted through vocal cords under direct vision,  positive ETCO2 and breath sounds checked- equal and bilateral Secured at: 21 cm Tube secured with: Tape Dental Injury: Teeth and Oropharynx as per pre-operative assessment

## 2017-07-31 NOTE — Transfer of Care (Signed)
Immediate Anesthesia Transfer of Care Note  Patient: Mary Wise  Procedure(s) Performed: LEFT PARTIAL MASTECTOMY WITH AXILLARY SENTINEL LYMPH NODE BIOPSY (Left )  Patient Location: PACU  Anesthesia Type:General  Level of Consciousness: awake, oriented and patient cooperative  Airway & Oxygen Therapy: Patient Spontanous Breathing and Patient connected to nasal cannula oxygen  Post-op Assessment: Report given to RN and Post -op Vital signs reviewed and stable  Post vital signs: Reviewed and stable  Last Vitals:  Vitals:   07/31/17 0925 07/31/17 1141  BP: 133/73 (P) 138/77  Pulse:    Resp: (!) 27 (P) 18  Temp:  36.7 C  SpO2: 99% (P) 95%    Last Pain:  Vitals:   07/31/17 0728  TempSrc: Oral         Complications: No apparent anesthesia complications

## 2017-07-31 NOTE — Anesthesia Postprocedure Evaluation (Signed)
Anesthesia Post Note  Patient: Mary Wise  Procedure(s) Performed: LEFT PARTIAL MASTECTOMY WITH AXILLARY SENTINEL LYMPH NODE BIOPSY (Left )  Patient location during evaluation: PACU Anesthesia Type: General Level of consciousness: awake and alert, oriented and patient cooperative Pain management: pain level controlled Vital Signs Assessment: post-procedure vital signs reviewed and stable Respiratory status: spontaneous breathing and respiratory function stable Cardiovascular status: stable Postop Assessment: no apparent nausea or vomiting Anesthetic complications: no     Last Vitals:  Vitals:   07/31/17 1225 07/31/17 1230  BP:  126/72  Pulse: 65 69  Resp: 19 12  Temp:    SpO2: 98% 97%    Last Pain:  Vitals:   07/31/17 1225  TempSrc:   PainSc: 5                  [name] A

## 2017-07-31 NOTE — Anesthesia Preprocedure Evaluation (Signed)
Anesthesia Evaluation  Patient identified by MRN, date of birth, ID band Patient awake    Reviewed: Allergy & Precautions, NPO status , Patient's Chart, lab work & pertinent test results, reviewed documented beta blocker date and time   Airway Mallampati: II  TM Distance: >3 FB Neck ROM: Full    Dental  (+) Poor Dentition, Chipped   Pulmonary    breath sounds clear to auscultation       Cardiovascular hypertension, Pt. on medications and Pt. on home beta blockers  Rhythm:Regular Rate:Normal     Neuro/Psych  Headaches, Seizures -, Well Controlled,  PSYCHIATRIC DISORDERS Anxiety    GI/Hepatic negative GI ROS, Neg liver ROS, GERD  Poorly Controlled,  Endo/Other  negative endocrine ROS  Renal/GU negative Renal ROS     Musculoskeletal   Abdominal   Peds  Hematology negative hematology ROS (+)   Anesthesia Other Findings   Reproductive/Obstetrics                             Anesthesia Physical Anesthesia Plan  ASA: II  Anesthesia Plan: General   Post-op Pain Management:    Induction: Intravenous, Rapid sequence and Cricoid pressure planned  PONV Risk Score and Plan:   Airway Management Planned: Oral ETT  Additional Equipment:   Intra-op Plan:   Post-operative Plan: Extubation in OR  Informed Consent: I have reviewed the patients History and Physical, chart, labs and discussed the procedure including the risks, benefits and alternatives for the proposed anesthesia with the patient or authorized representative who has indicated his/her understanding and acceptance.     Plan Discussed with:   Anesthesia Plan Comments:         Anesthesia Quick Evaluation

## 2017-08-03 ENCOUNTER — Encounter (HOSPITAL_COMMUNITY): Payer: Self-pay | Admitting: General Surgery

## 2017-08-03 NOTE — Progress Notes (Deleted)
Psychiatric Initial Adult Assessment   Patient Identification: Mary Wise MRN:  [id number] Date of Evaluation:  08/03/2017 Referral Source: *** Chief Complaint:   Visit Diagnosis: No diagnosis found.  History of Present Illness:   Mary Wise is a 66 year old female with mood disorder, migraine, asthma, breast cancer s/p left partial mastectomy, who is referred for anxiety/establish MH care.   Ativan, xanax, valium  Associated Signs/Symptoms: Depression Symptoms:  {DEPRESSION SYMPTOMS:20000} (Hypo) Manic Symptoms:  {BHH MANIC SYMPTOMS:22872} Anxiety Symptoms:  {BHH ANXIETY SYMPTOMS:22873} Psychotic Symptoms:  {BHH PSYCHOTIC SYMPTOMS:22874} PTSD Symptoms: {BHH PTSD SYMPTOMS:22875}  Past Psychiatric History:  Outpatient:  Psychiatry admission:  Previous suicide attempt:  Past trials of medication:  History of violence:   Previous Psychotropic Medications: {YES/NO:21197}  Substance Abuse History in the last 12 months:  {yes no:314532}  Consequences of Substance Abuse: {BHH CONSEQUENCES OF SUBSTANCE ABUSE:22880}  Past Medical History:  Past Medical History:  Diagnosis Date  . Anxiety   . Cancer [facility])    left breast cancer  . Hemorrhoids   . Lipoma of left shoulder   . Migraine   . Migraine   . Seizures ([place])     Past Surgical History:  Procedure Laterality Date  . ABDOMINAL HYSTERECTOMY    . APPENDECTOMY    . CESAREAN SECTION    . CHOLECYSTECTOMY    . ERCP    . LIVER SURGERY    . PARTIAL MASTECTOMY WITH AXILLARY SENTINEL LYMPH NODE BIOPSY Left 07/31/2017   Procedure: LEFT PARTIAL MASTECTOMY WITH AXILLARY SENTINEL LYMPH NODE BIOPSY;  Surgeon: Virl Cagey, MD;  Location: [facility];  Service: General;  Laterality: Left;    Family Psychiatric History: ***  Family History:  Family History  Problem Relation Age of Onset  . Cancer Mother   . Cancer Father   . Cancer Sister   . Diabetes Brother   . Cancer Maternal Grandmother   . Cancer Other   .  Diabetes Sister   . Diabetes Sister   . Diabetes Sister     Social History:   Social History   Socioeconomic History  . Marital status: Single    Spouse name: Not on file  . Number of children: Not on file  . Years of education: Not on file  . Highest education level: Not on file  Social Needs  . Financial resource strain: Not on file  . Food insecurity - worry: Not on file  . Food insecurity - inability: Not on file  . Transportation needs - medical: Not on file  . Transportation needs - non-medical: Not on file  Occupational History  . Not on file  Tobacco Use  . Smoking status: Never Smoker  . Smokeless tobacco: Never Used  Substance and Sexual Activity  . Alcohol use: No  . Drug use: No  . Sexual activity: No  Other Topics Concern  . Not on file  Social History Narrative   Lives in [place] [address].     Additional Social History: ***  Allergies:   Allergies  Allergen Reactions  . Onion Other (See Comments)    REACTION: results in coma state  . Penicillins Hives, Shortness Of Breath and Swelling    Throat swelling Has patient had a PCN reaction causing immediate rash, facial/tongue/throat swelling, SOB or lightheadedness with hypotension: Yes Has patient had a PCN reaction causing severe rash involving mucus membranes or skin necrosis: No Has patient had a PCN reaction that required hospitalization: Yes Has  patient had a PCN reaction occurring within the last 10 years: No If all of the above answers are "NO", then may proceed with Cephalosporin use.   . Sulfa Antibiotics Hives and Shortness Of Breath  . Yellow Dyes (Non-Tartrazine)     REACTION: #5 and #9 "will kill me"  . Clindamycin/Lincomycin Hives and Nausea And Vomiting    Metabolic Disorder Labs: Lab Results  Component Value Date   HGBA1C 5.4 07/01/2017   MPG 108 07/01/2017   No results found for: PROLACTIN Lab Results  Component Value Date   CHOL 231 (H) 07/01/2017   TRIG 148  07/01/2017   HDL 65 07/01/2017   CHOLHDL 3.6 07/01/2017     Current Medications: Current Outpatient Medications  Medication Sig Dispense Refill  . albuterol (PROVENTIL HFA;VENTOLIN HFA) 108 (90 Base) MCG/ACT inhaler Inhale 2 puffs into the lungs every 6 (six) hours as needed for wheezing or shortness of breath.    Marland Kitchen albuterol (PROVENTIL HFA;VENTOLIN HFA) 108 (90 Base) MCG/ACT inhaler Inhale 1-2 puffs into the lungs every 6 (six) hours as needed for wheezing or shortness of breath. (Patient not taking: Reported on 07/01/2017) 1 Inhaler 1  . ALPRAZolam (XANAX) 1 MG tablet Take 1 mg by mouth daily as needed for anxiety.     Marland Kitchen Bioflavonoid Products (BIOFLEX) TABS Take 1 tablet by mouth daily.    . Hypromellose (ARTIFICIAL TEARS OP) Apply to eye 2 (two) times daily. Doesn't do drops in eyes does as a wash to flush eyes out    . lidocaine (LIDODERM) 5 % Place 0.5-1 patches onto the skin daily as needed (pain). Remove & Discard patch within 12 hours or as directed by MD    . Menthol, Topical Analgesic, (BIOFREEZE EX) Apply 1 application topically as needed (pain).    . Menthol-Methyl Salicylate (MUSCLE RUB) 10-15 % CREA Apply 1 application topically as needed for muscle pain.    . naproxen (NAPROSYN) 500 MG tablet Take 1 tablet (500 mg total) by mouth 2 (two) times daily as needed (migraine). (Patient not taking: Reported on 07/01/2017) 40 tablet 0  . oxyCODONE (ROXICODONE) 5 MG immediate release tablet Take 1 tablet (5 mg total) by mouth every 4 (four) hours as needed for severe pain or breakthrough pain. 30 tablet 0  . propranolol ER (INDERAL LA) 80 MG 24 hr capsule Take 1 capsule (80 mg total) by mouth daily. (Patient taking differently: Take 80 mg by mouth at bedtime. ) 30 capsule 1  . traZODone (DESYREL) 50 MG tablet Take 1 tablet (50 mg total) by mouth at bedtime. (Patient not taking: Reported on 07/17/2017) 30 tablet 2   No current facility-administered medications for this visit.      Neurologic: Headache: No Seizure: No Paresthesias:Negative  Musculoskeletal: Strength & Muscle Tone: within normal limits Gait & Station: normal Patient leans: N/A  Psychiatric Specialty Exam: ROS  There were no vitals taken for this visit.There is no height or weight on file to calculate BMI.  General Appearance: Fairly Groomed  Eye Contact:  Good  Speech:  Clear and Coherent  Volume:  Normal  Mood:  {BHH MOOD:22306}  Affect:  {Affect (PAA):22687}  Thought Process:  Coherent and Goal Directed  Orientation:  Full (Time, Place, and Person)  Thought Content:  Logical  Suicidal Thoughts:  {ST/HT (PAA):22692}  Homicidal Thoughts:  {ST/HT (PAA):22692}  Memory:  Immediate;   Good Recent;   Good Remote;   Good  Judgement:  {Judgement (PAA):22694}  Insight:  {Insight (PAA):22695}  Psychomotor Activity:  Normal  Concentration:  Concentration: Good and Attention Span: Good  Recall:  Good  Fund of Knowledge:Good  Language: Good  Akathisia:  No  Handed:  Right  AIMS (if indicated):  N/A  Assets:  Communication Skills Desire for Improvement  ADL's:  Intact  Cognition: WNL  Sleep:  ***   Assessment  Plan  The patient demonstrates the following risk factors for suicide: Chronic risk factors for suicide include: {Chronic Risk Factors for ELFYBOF:75102585}. Acute risk factors for suicide include: {Acute Risk Factors for IDPOEUM:35361443}. Protective factors for this patient include: {Protective Factors for Suicide XVQM:08676195}. Considering these factors, the overall suicide risk at this point appears to be {Desc; low/moderate/high:110033}. Patient {ACTION; IS/IS KDT:26712458} appropriate for outpatient follow up.   Treatment Plan Summary: Plan as above   Norman Clay, MD 12/31/201812:49 PM

## 2017-08-05 ENCOUNTER — Ambulatory Visit (HOSPITAL_COMMUNITY): Payer: Medicare Other | Admitting: Psychiatry

## 2017-08-06 ENCOUNTER — Ambulatory Visit (INDEPENDENT_AMBULATORY_CARE_PROVIDER_SITE_OTHER): Payer: Self-pay | Admitting: General Surgery

## 2017-08-06 VITALS — BP 148/91 | HR 87 | Temp 98.2°F | Resp 18 | Ht 68.0 in | Wt 194.0 lb

## 2017-08-06 DIAGNOSIS — C50912 Malignant neoplasm of unspecified site of left female breast: Secondary | ICD-10-CM

## 2017-08-07 ENCOUNTER — Encounter: Payer: Self-pay | Admitting: General Surgery

## 2017-08-07 NOTE — Progress Notes (Signed)
[facility] Note   HPI:  67 y.o. Female presents to clinic for post-op follow-up evaluation after a left partial mastectomy with sentinel node biopsy on 07/31/17.  She has been doing fair. Says she has is having some soreness in the area, but no erythema or drainage.   Review of Systems:  No fevers or chills No drainage +Soreness All other review of systems: otherwise negative   Pathology: Diagnosis- margins negative, 5 LN negative for carcinoma  1. Lymph node, sentinel, biopsy, left - ONE OF ONE LYMPH NODES NEGATIVE FOR CARCINOMA (0/1). 2. 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 - INVASIVE CARCINOMA IS <0.1 CM OF THE ANTERIOR MARGIN BROADLY. - SEE ONCOLOGY TABLE. 3. Breast, excision, left deep - BENIGN BREAST TISSUE. 4. Breast, excision, left superior - BENIGN BREAST TISSUE. 5. Breast, excision, left medial - BENIGN BREAST TISSUE. 6. Breast, excision, left inferior - BENIGN BREAST TISSUE. 7. Breast, excision, left lateral - BENIGN BREAST TISSUE. 8. Lymph node, sentinel, biopsy - ONE OF ONE LYMPH NODES NEGATIVE FOR CARCINOMA (0/1). 9. Lymph node, sentinel, biopsy - ONE OF ONE LYMPH NODES NEGATIVE FOR CARCINOMA (0/1). 10. Lymph node, sentinel, biopsy - ONE OF ONE LYMPH NODES NEGATIVE FOR CARCINOMA (0/1). 11. Lymph node, sentinel, biopsy - ONE OF ONE LYMPH NODES NEGATIVE FOR CARCINOMA (0/1).  Vital Signs:  BP (!) 148/91   Pulse 87   Temp 98.2 F (36.8 C)   Resp 18   Ht 5\' 8"  (1.727 m)   Wt 194 lb (88 kg)   BMI 29.50 kg/m    Physical Exam:  Physical Exam  Constitutional: She is well-developed, well-nourished, and in no distress.  HENT:  Head: Normocephalic.  Eyes: Pupils are equal, round, and reactive to light.  Cardiovascular: Normal rate and regular rhythm.  Pulmonary/Chest: Effort normal.  Left axillary incision c/d/i with no erythema or drainage, dermabond in place, left lateral breast  incision c/d/i with dermabond, no erythema or drainage, minimal induration over areas  Musculoskeletal: Normal range of motion.  Vitals reviewed. Left shoulder lipoma over deltoid   Laboratory studies: None   Imaging:  None   Assessment:  67 y.o. 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  She is doing well. She missed her preoperative appointment regarding chemotherapy and radiation but understands that radiation is a must given the partial mastectomy.  She also understands that the size of her breast cancer and her triple negative status qualifies her for chemotherapy.  She is still unsure if she wants to pursue this, but knows she needs to go speak with the experts. We have discussed port placement and the risk and benefits of bleeding, infection, pneumothorax, but will discuss more if needed in the future.   Plan:  - Follow up PRN with surgery   - Referral back to Oncology  - Plans for lipoma removal at [facility], I would give her breast and axilla a few weeks to heal since she will need to have her arm extended and manipulated for the lipoma surgery    All of the above recommendations were discussed with the patient and patient's family, and all of patient's and family's questions were answered to their expressed satisfaction.  Curlene Labrum, MD [facility] [address] [place], [place] [address] [phone] (office)

## 2017-08-19 ENCOUNTER — Encounter (HOSPITAL_COMMUNITY): Payer: Self-pay | Admitting: Hematology and Oncology

## 2017-08-19 ENCOUNTER — Inpatient Hospital Stay (HOSPITAL_COMMUNITY): Payer: Medicare Other

## 2017-08-19 ENCOUNTER — Other Ambulatory Visit: Payer: Self-pay

## 2017-08-19 ENCOUNTER — Inpatient Hospital Stay (HOSPITAL_COMMUNITY): Payer: Medicare Other | Attending: Hematology and Oncology | Admitting: Hematology and Oncology

## 2017-08-19 VITALS — BP 142/91 | HR 80 | Resp 18 | Ht 68.0 in | Wt 194.0 lb

## 2017-08-19 DIAGNOSIS — C50412 Malignant neoplasm of upper-outer quadrant of left female breast: Secondary | ICD-10-CM | POA: Diagnosis not present

## 2017-08-19 DIAGNOSIS — Z809 Family history of malignant neoplasm, unspecified: Secondary | ICD-10-CM | POA: Diagnosis not present

## 2017-08-19 DIAGNOSIS — Z171 Estrogen receptor negative status [ER-]: Secondary | ICD-10-CM | POA: Diagnosis not present

## 2017-08-19 LAB — COMPREHENSIVE METABOLIC PANEL
ALK PHOS: 106 U/L (ref 38–126)
ALT: 22 U/L (ref 14–54)
AST: 26 U/L (ref 15–41)
Albumin: 4.1 g/dL (ref 3.5–5.0)
Anion gap: 11 (ref 5–15)
BUN: 16 mg/dL (ref 6–20)
CALCIUM: 9.2 mg/dL (ref 8.9–10.3)
CO2: 25 mmol/L (ref 22–32)
CREATININE: 0.86 mg/dL (ref 0.44–1.00)
Chloride: 103 mmol/L (ref 101–111)
GFR calc non Af Amer: >60 mL/min (ref >60)
Glucose, Bld: 92 mg/dL (ref 65–99)
Potassium: 3.6 mmol/L (ref 3.5–5.1)
SODIUM: 139 mmol/L (ref 135–145)
Total Bilirubin: 0.7 mg/dL (ref 0.3–1.2)
Total Protein: 7.9 g/dL (ref 6.5–8.1)

## 2017-08-19 LAB — CBC WITH DIFFERENTIAL/PLATELET
Basophils Absolute: 0.1 10*3/uL (ref 0.0–0.1)
Basophils Relative: 1 %
EOS ABS: 0.3 10*3/uL (ref 0.0–0.7)
Eosinophils Relative: 3 %
HCT: 39.8 % (ref 36.0–46.0)
HEMOGLOBIN: 13 g/dL (ref 12.0–15.0)
LYMPHS ABS: 3.5 10*3/uL (ref 0.7–4.0)
LYMPHS PCT: 42 %
MCH: 29.3 pg (ref 26.0–34.0)
MCHC: 32.7 g/dL (ref 30.0–36.0)
MCV: 89.8 fL (ref 78.0–100.0)
Monocytes Absolute: 0.5 10*3/uL (ref 0.1–1.0)
Monocytes Relative: 6 %
NEUTROS ABS: 4.2 10*3/uL (ref 1.7–7.7)
NEUTROS PCT: 48 %
Platelets: 368 10*3/uL (ref 150–400)
RBC: 4.43 MIL/uL (ref 3.87–5.11)
RDW: 12.7 % (ref 11.5–15.5)
WBC: 8.5 10*3/uL (ref 4.0–10.5)

## 2017-08-20 ENCOUNTER — Encounter: Payer: Self-pay | Admitting: Radiation Oncology

## 2017-08-25 ENCOUNTER — Other Ambulatory Visit: Payer: Self-pay | Admitting: Family Medicine

## 2017-08-25 DIAGNOSIS — I10 Essential (primary) hypertension: Secondary | ICD-10-CM

## 2017-08-25 DIAGNOSIS — G43909 Migraine, unspecified, not intractable, without status migrainosus: Secondary | ICD-10-CM

## 2017-08-26 ENCOUNTER — Ambulatory Visit: Payer: Medicare Other | Admitting: Radiation Oncology

## 2017-09-01 NOTE — Progress Notes (Signed)
[facility] New Visit:  Assessment: Malignant neoplasm of upper-outer quadrant of left breast in female, estrogen receptor negative ([place]) 67 y.o. 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  Patient underwent successful surgical resection and is recovering well from her surgery.  Based on  The stage of the disease as well as the triple negative biology, I do recommend patient to proceed with adjuvant systemic chemotherapy.  As there is no involvement in the axillary lymph nodes via sentinel lymph node biopsy, I believe that combination of docetaxel and cyclophosphamide may be adequate for the treatment.  Additionally, patient needs to meet with radiation oncology for opinion regarding possible use of adjuvant radiotherapy as she did not undergo complete mastectomy.  Despite my recommendation, patient does not appear to be interested in systemic chemotherapy at this time.  She understands that the purpose of the treatment would be to reduce chances of local, lymph node, as well as distal recurrence as there is no hormone therapy at that would be meaningfully useful considering the hormone-receptor negative status of her tumor.  Plan: - Consult radiation oncology for evaluation for adjuvant radiotherapy.  Patient is hesitant to receive that therapy, but I hope she at least considers meeting with a radiation oncologist to discuss that option -Unless patient changes her mind, return to our clinic in 3 months with labs for recurrence monitoring.  If she continues to refuse systemic therapy, I recommend seeing her in the clinic with physical examination lab work every 3 months for the first 1-2 years of the clinical course subsequently reducing the frequency as the likelihood of recurrence of this aggressive form of breast cancer diminishes.  Voice recognition software was used and creation of this  note. Despite my best effort at editing the text, some misspelling/errors may have occurred.  Orders Placed This Encounter  Procedures  . CBC with Differential    Standing Status:   Future    Number of Occurrences:   1    Standing Expiration Date:   08/19/2018  . Comprehensive metabolic panel    Standing Status:   Future    Number of Occurrences:   1    Standing Expiration Date:   08/19/2018  . CBC with Differential    Standing Status:   Future    Standing Expiration Date:   08/19/2018  . Comprehensive metabolic panel    Standing Status:   Future    Standing Expiration Date:   08/19/2018  . Ambulatory referral to Radiation Oncology    Referral Priority:   Routine    Referral Type:   Consultation    Referral Reason:   Specialty Services Required    Requested Specialty:   Radiation Oncology    Number of Visits Requested:   1    All questions were answered.  . The patient knows to call the clinic with any problems, questions or concerns.  This note was electronically signed.    History of Presenting Illness Mary Wise 67 y.o. presenting to the [name] for new diagnosis of invasive ductal carcinoma of the upper outer quadrant of the left breast.  Patient referred to [place] by Dr. Oliva Bustard.  Patient initially presented with a large mass in the left deltoid region which was determined to be a lipoma.  During evaluation, a mass was discovered in the lateral aspect of the left breast leading to additional  evaluation as outlined below in the oncological history.  Subsequently, based on clinical stage II malignancy, patient underwent surgical intervention with partial mastectomy.  She presents to our clinic to assess for possible need of adjuvant therapy.  At this time, patient still has significant amount of discomfort in the operative site although the drains have been removed and the swelling is subsiding rapidly.  Patient still has discomfort using the left upper extremity due  to presence of the lipoma.  Patient denies any chest pain, shortness of breath, or cough.  No nausea, vomiting, abdominal pain.  Denies any pain in the bones.  Oncological/hematological History:   Malignant neoplasm of upper-outer quadrant of left breast in female, estrogen receptor negative (Mary Wise)   06/16/2017 Initial Diagnosis    Malignant neoplasm of upper-outer quadrant of left breast in female: Initially presented with left deltoid mass which was consistent with lipoma.  During examination, a mass in the left breast was appreciated by palpation.      06/16/2017 Mammogram    BL Diagnostic Study: Irregular hyperdense mass in the upper outer quadrant of the left breast measuring 3.6 cm without radiographically detectable lymphadenopathy.  Normal appearance of the right breast.      06/16/2017 Breast US    3.2 cm irregular mass located at 2:30 o'clock, no pathological lymphadenopathy.      06/23/2017 Pathology Results    Ultrasound-guided biopsy: Positive for invasive ductal carcinoma, grade 3, ER 0%, PR 0%, HER-2 negative by [facility]      07/31/2017 Surgery    Partial left mastectomy with sentinel lymph node biopsy --Pathology: Invasive ductal carcinoma, measuring 4.5 cm, grade 3.  All margins negative, but the anterior margin less than 0.1 cm.  No surrounding DCIS.  0/5 lymph nodes positive for malignancy        Medical History: Past Medical History:  Diagnosis Date  . Anxiety   . Cancer [facility])    left breast cancer  . Hemorrhoids   . Lipoma of left shoulder   . Migraine   . Migraine   . Seizures [facility])     Surgical History: Past Surgical History:  Procedure Laterality Date  . ABDOMINAL HYSTERECTOMY    . APPENDECTOMY    . CESAREAN SECTION    . CHOLECYSTECTOMY    . ERCP    . LIVER SURGERY    . PARTIAL MASTECTOMY WITH AXILLARY SENTINEL LYMPH NODE BIOPSY Left 07/31/2017   Procedure: LEFT PARTIAL MASTECTOMY WITH AXILLARY SENTINEL LYMPH NODE BIOPSY;  Surgeon: Virl Cagey, MD;  Location: [facility];  Service: General;  Laterality: Left;    Family History: Family History  Problem Relation Age of Onset  . Cancer Mother   . Cancer Father   . Cancer Sister   . Diabetes Brother   . Cancer Maternal Grandmother   . Cancer Other   . Diabetes Sister   . Diabetes Sister   . Diabetes Sister     Social History: Social History   Socioeconomic History  . Marital status: Single    Spouse name: Not on file  . Number of children: Not on file  . Years of education: Not on file  . Highest education level: Not on file  Social Needs  . Financial resource strain: Not on file  . Food insecurity - worry: Not on file  . Food insecurity - inability: Not on file  . Transportation needs - medical: Not on file  . Transportation needs - non-medical: Not on file  Occupational  History  . Not on file  Tobacco Use  . Smoking status: Never Smoker  . Smokeless tobacco: Never Used  Substance and Sexual Activity  . Alcohol use: No  . Drug use: No  . Sexual activity: No  Other Topics Concern  . Not on file  Social History Narrative   Lives in [place] [address].     Allergies: Allergies  Allergen Reactions  . Onion Other (See Comments)    REACTION: results in coma state  . Penicillins Hives, Shortness Of Breath and Swelling    Throat swelling Has patient had a PCN reaction causing immediate rash, facial/tongue/throat swelling, SOB or lightheadedness with hypotension: Yes Has patient had a PCN reaction causing severe rash involving mucus membranes or skin necrosis: No Has patient had a PCN reaction that required hospitalization: Yes Has patient had a PCN reaction occurring within the last 10 years: No If all of the above answers are "NO", then may proceed with Cephalosporin use.   . Sulfa Antibiotics Hives and Shortness Of Breath  . Yellow Dyes (Non-Tartrazine)     REACTION: #5 and #9 "will kill me"  . Clindamycin/Lincomycin Hives and Nausea  And Vomiting    Medications:  Current Outpatient Medications  Medication Sig Dispense Refill  . albuterol (PROVENTIL HFA;VENTOLIN HFA) 108 (90 Base) MCG/ACT inhaler Inhale 2 puffs into the lungs every 6 (six) hours as needed for wheezing or shortness of breath.    . ALPRAZolam (XANAX) 1 MG tablet Take 1 mg by mouth daily as needed for anxiety.     Marland Kitchen Bioflavonoid Products (BIOFLEX) TABS Take 1 tablet by mouth daily.    . Hypromellose (ARTIFICIAL TEARS OP) Apply to eye 2 (two) times daily. Doesn't do drops in eyes does as a wash to flush eyes out    . Menthol, Topical Analgesic, (BIOFREEZE EX) Apply 1 application topically as needed (pain).    Marland Kitchen lidocaine (LIDODERM) 5 % Place 0.5-1 patches onto the skin daily as needed (pain). Remove & Discard patch within 12 hours or as directed by MD    . propranolol ER (INDERAL LA) 80 MG 24 hr capsule TAKE 1 CAPSULE BY MOUTH ONCE DAILY 30 capsule 1   No current facility-administered medications for this visit.     Review of Systems: Review of Systems  Constitutional: Positive for fatigue.  Cardiovascular: Positive for leg swelling.  Neurological: Positive for dizziness.  Hematological: Bruises/bleeds easily.  Psychiatric/Behavioral: The patient is nervous/anxious.   All other systems reviewed and are negative.    PHYSICAL EXAMINATION Blood pressure (!) 142/91, pulse 80, resp. rate 18, height _0  (1.727 m), weight 194 lb (88 kg), SpO2 98 %.  ECOG PERFORMANCE STATUS: 1 - Symptomatic but completely ambulatory  Physical Exam  Constitutional: She is oriented to person, place, and time. No distress.  HENT:  Head: Normocephalic and atraumatic.  Mouth/Throat: Oropharynx is clear and moist. No oropharyngeal exudate.  Eyes: Conjunctivae and EOM are normal. Pupils are equal, round, and reactive to light. No scleral icterus.  Neck: No thyromegaly present.  Cardiovascular: Normal rate, regular rhythm and normal heart sounds.  No murmur  heard. Pulmonary/Chest: Effort normal and breath sounds normal. No respiratory distress. She has no wheezes. She has no rales.  Examination of the right breast reveals normal appearance of the breast without skin dimpling or nipple retraction.  No palpable mass with palpation extended upward to the clavicle and laterally into the armpit.  On the left side, healing surgical incision is  noted.  No significant fluid collection, erythema, or wound dehiscence.  Left deltoid region with a large soft mass creating very noticeable asymmetry consistent with known  lipoma.  Abdominal: Soft. Bowel sounds are normal. She exhibits no distension and no mass. There is no tenderness. There is no guarding.  Musculoskeletal: She exhibits no edema.  Lymphadenopathy:    She has no cervical adenopathy.  Neurological: She is alert and oriented to person, place, and time. She has normal reflexes. No cranial nerve deficit.  Skin: Skin is warm and dry. No rash noted. She is not diaphoretic. No erythema.     LABORATORY DATA: I have personally reviewed the data as listed: Appointment on 08/19/2017  Component Date Value Ref Range Status  . WBC 08/19/2017 8.5  4.0 - 10.5 K/uL Final  . RBC 08/19/2017 4.43  3.87 - 5.11 MIL/uL Final  . Hemoglobin 08/19/2017 13.0  12.0 - 15.0 g/dL Final  . HCT 08/19/2017 39.8  36.0 - 46.0 % Final  . MCV 08/19/2017 89.8  78.0 - 100.0 fL Final  . MCH 08/19/2017 29.3  26.0 - 34.0 pg Final  . MCHC 08/19/2017 32.7  30.0 - 36.0 g/dL Final  . RDW 08/19/2017 12.7  11.5 - 15.5 % Final  . Platelets 08/19/2017 368  150 - 400 K/uL Final  . Neutrophils Relative % 08/19/2017 48  % Final  . Neutro Abs 08/19/2017 4.2  1.7 - 7.7 K/uL Final  . Lymphocytes Relative 08/19/2017 42  % Final  . Lymphs Abs 08/19/2017 3.5  0.7 - 4.0 K/uL Final  . Monocytes Relative 08/19/2017 6  % Final  . Monocytes Absolute 08/19/2017 0.5  0.1 - 1.0 K/uL Final  . Eosinophils Relative 08/19/2017 3  % Final  . Eosinophils  Absolute 08/19/2017 0.3  0.0 - 0.7 K/uL Final  . Basophils Relative 08/19/2017 1  % Final  . Basophils Absolute 08/19/2017 0.1  0.0 - 0.1 K/uL Final  . Sodium 08/19/2017 139  135 - 145 mmol/L Final  . Potassium 08/19/2017 3.6  3.5 - 5.1 mmol/L Final  . Chloride 08/19/2017 103  101 - 111 mmol/L Final  . CO2 08/19/2017 25  22 - 32 mmol/L Final  . Glucose, Bld 08/19/2017 92  65 - 99 mg/dL Final  . BUN 08/19/2017 16  6 - 20 mg/dL Final  . Creatinine, Ser 08/19/2017 0.86  0.44 - 1.00 mg/dL Final  . Calcium 08/19/2017 9.2  8.9 - 10.3 mg/dL Final  . Total Protein 08/19/2017 7.9  6.5 - 8.1 g/dL Final  . Albumin 08/19/2017 4.1  3.5 - 5.0 g/dL Final  . AST 08/19/2017 26  15 - 41 U/L Final  . ALT 08/19/2017 22  14 - 54 U/L Final  . Alkaline Phosphatase 08/19/2017 106  38 - 126 U/L Final  . Total Bilirubin 08/19/2017 0.7  0.3 - 1.2 mg/dL Final  . GFR calc non Af Amer 08/19/2017 >60  >60 mL/min Final  . GFR calc Af Amer 08/19/2017 >60  >60 mL/min Final   Comment: (NOTE) The eGFR has been calculated using the CKD EPI equation. This calculation has not been validated in all clinical situations. eGFR's persistently <60 mL/min signify possible Chronic Kidney Disease.   Georgiann Hahn gap 08/19/2017 11  5 - 15 Final         Ardath Sax, MD

## 2017-09-01 NOTE — Assessment & Plan Note (Addendum)
67 y.o. 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  Patient underwent successful surgical resection and is recovering well from her surgery.  Based on  The stage of the disease as well as the triple negative biology, I do recommend patient to proceed with adjuvant systemic chemotherapy.  As there is no involvement in the axillary lymph nodes via sentinel lymph node biopsy, I believe that combination of docetaxel and cyclophosphamide may be adequate for the treatment.  Additionally, patient needs to meet with radiation oncology for opinion regarding possible use of adjuvant radiotherapy as she did not undergo complete mastectomy.  Despite my recommendation, patient does not appear to be interested in systemic chemotherapy at this time.  She understands that the purpose of the treatment would be to reduce chances of local, lymph node, as well as distal recurrence as there is no hormone therapy at that would be meaningfully useful considering the hormone-receptor negative status of her tumor.  Plan: - Consult radiation oncology for evaluation for adjuvant radiotherapy.  Patient is hesitant to receive that therapy, but I hope she at least considers meeting with a radiation oncologist to discuss that option -Unless patient changes her mind, return to our clinic in 3 months with labs for recurrence monitoring.  If she continues to refuse systemic therapy, I recommend seeing her in the clinic with physical examination lab work every 3 months for the first 1-2 years of the clinical course subsequently reducing the frequency as the likelihood of recurrence of this aggressive form of breast cancer diminishes.

## 2017-09-08 ENCOUNTER — Ambulatory Visit: Payer: Medicare Other | Admitting: Radiation Oncology

## 2017-09-08 ENCOUNTER — Ambulatory Visit: Payer: Medicare Other

## 2017-09-24 NOTE — Progress Notes (Signed)
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  Receptor Status: ER(NEG), PR (NEG), Her2-neu (NEG), Ki-(90%) 07/31/17 Diagnosis 1. Lymph node, sentinel, biopsy, left - ONE OF ONE LYMPH NODES NEGATIVE FOR CARCINOMA (0/1). 2. 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 - INVASIVE CARCINOMA IS <0.1 CM OF THE ANTERIOR MARGIN BROADLY. - SEE ONCOLOGY TABLE. 3. Breast, excision, left deep - BENIGN BREAST TISSUE. 4. Breast, excision, left superior - BENIGN BREAST TISSUE. 5. Breast, excision, left medial - BENIGN BREAST TISSUE. 6. Breast, excision, left inferior - BENIGN BREAST TISSUE. 7. Breast, excision, left lateral - BENIGN BREAST TISSUE. 8. Lymph node, sentinel, biopsy - ONE OF ONE LYMPH NODES NEGATIVE FOR CARCINOMA (0/1). 9. Lymph node, sentinel, biopsy - ONE OF ONE LYMPH NODES NEGATIVE FOR CARCINOMA (0/1). 10. Lymph node, sentinel, biopsy - ONE OF ONE LYMPH NODES NEGATIVE FOR CARCINOMA (0/1). 11. Lymph node, sentinel, biopsy - ONE OF ONE LYMPH NODES NEGATIVE FOR CARCINOMA (0/1).   Did patient present with symptoms or was this found on screening mammography?: She "bumped" her Left breast lightly and then a second time. Her Breast turned darker after this injury and she presented to her PCP.   Past/Anticipated interventions by surgeon, if any: 07/31/17 [facility]. Procedure(s) Performed: Partial mastectomy with sentinel lymph node biopsy following Radiotracer injection by Radiology    Surgeon: Lanell Matar. Constance Haw, MD  Past/Anticipated interventions by medical oncology, if any:  Dr. Lebron Conners 08/19/17 The stage of the disease as well as the triple negative biology, I do recommend patient to proceed with adjuvant systemic chemotherapy.  As there is no involvement in the axillary lymph nodes via sentinel  lymph node biopsy, I believe that combination of docetaxel and cyclophosphamide may be adequate for the treatment.  Additionally, patient needs to meet with radiation oncology for opinion regarding possible use of adjuvant radiotherapy as she did not undergo complete mastectomy.  Despite my recommendation, patient does not appear to be interested in systemic chemotherapy at this time.  She understands that the purpose of the treatment would be to reduce chances of local, lymph node, as well as distal recurrence as there is no hormone therapy at that would be meaningfully useful considering the hormone-receptor negative status of her tumor.  Plan: - Consult radiation oncology for evaluation for adjuvant radiotherapy.  Patient is hesitant to receive that therapy, but I hope she at least considers meeting with a radiation oncologist to discuss that option -Unless patient changes her mind, return to our clinic in 3 months with labs for recurrence monitoring.  If she continues to refuse systemic therapy, I recommend seeing her in the clinic with physical examination lab work every 3 months for the first 1-2 years of the clinical course subsequently reducing the frequency as the likelihood of recurrence of this aggressive form of breast cancer diminishes.  Lymphedema issues, if any: She has a lipoma present to her Left Shoulder, and plans to have this surgically removed after she completes treatment for Breast Cancer. She reports numbness to her incision site. She does have difficulty with Left arm mobility due to her Lipoma.    Pain issues, if any:  She reports chronic pain to her bilateral legs and knees.   SAFETY ISSUES:  Prior radiation? No  Pacemaker/ICD? No  Possible current pregnancy? No  Is the patient on methotrexate? No  Current Complaints / other  details:  BP (!) 131/98   Pulse 78   Temp 97.8 F (36.6 C)   Ht _0  (1.727 m)   Wt 194 lb 3.2 oz (88.1 kg)   BMI 29.53 kg/m     Wt  Readings from Last 3 Encounters:  09/29/17 194 lb 3.2 oz (88.1 kg)  08/19/17 194 lb (88 kg)  08/06/17 194 lb (88 kg)      Mary Wise, Stephani Police, RN 09/24/2017,2:50 PM

## 2017-09-29 ENCOUNTER — Encounter: Payer: Self-pay | Admitting: Radiation Oncology

## 2017-09-29 ENCOUNTER — Ambulatory Visit
Admission: RE | Admit: 2017-09-29 | Discharge: 2017-09-29 | Disposition: A | Discharge status: Home / Self Care | Payer: Medicare Other | Source: Ambulatory Visit | Attending: Radiation Oncology | Admitting: Radiation Oncology

## 2017-09-29 VITALS — BP 131/98 | HR 78 | Temp 97.8°F | Ht 68.0 in | Wt 194.2 lb

## 2017-09-29 DIAGNOSIS — C50412 Malignant neoplasm of upper-outer quadrant of left female breast: Secondary | ICD-10-CM | POA: Diagnosis not present

## 2017-09-29 DIAGNOSIS — Z79899 Other long term (current) drug therapy: Secondary | ICD-10-CM | POA: Diagnosis not present

## 2017-09-29 DIAGNOSIS — Z9012 Acquired absence of left breast and nipple: Secondary | ICD-10-CM | POA: Insufficient documentation

## 2017-09-29 DIAGNOSIS — F419 Anxiety disorder, unspecified: Secondary | ICD-10-CM | POA: Insufficient documentation

## 2017-09-29 DIAGNOSIS — Z87891 Personal history of nicotine dependence: Secondary | ICD-10-CM | POA: Diagnosis not present

## 2017-09-29 DIAGNOSIS — D171 Benign lipomatous neoplasm of skin and subcutaneous tissue of trunk: Secondary | ICD-10-CM | POA: Insufficient documentation

## 2017-09-29 DIAGNOSIS — Z171 Estrogen receptor negative status [ER-]: Principal | ICD-10-CM

## 2017-09-29 DIAGNOSIS — Z809 Family history of malignant neoplasm, unspecified: Secondary | ICD-10-CM | POA: Diagnosis not present

## 2017-09-29 DIAGNOSIS — G40909 Epilepsy, unspecified, not intractable, without status epilepticus: Secondary | ICD-10-CM | POA: Diagnosis not present

## 2017-09-29 DIAGNOSIS — J439 Emphysema, unspecified: Secondary | ICD-10-CM | POA: Insufficient documentation

## 2017-09-29 DIAGNOSIS — D1722 Benign lipomatous neoplasm of skin and subcutaneous tissue of left arm: Secondary | ICD-10-CM | POA: Diagnosis not present

## 2017-09-29 NOTE — Progress Notes (Signed)
Radiation Oncology         [phone] ________________________________  Initial outpatient Consultation  Name: Mary Wise MRN: [id number]  Date: 09/29/2017  DOB: 10/06/1950  CC:Hagler, Rachel, MD  Perlov, Mikhail G, MD   REFERRING PHYSICIAN: Perlov, Mikhail G, MD  DIAGNOSIS:    ICD-10-CM   1. 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 female. 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 She states this had been present for over a decade. During evaluation, a mass was discovered in the lateral aspect of the left breast leading to additional evaluation. She was sent for mammogram and referred to Dr. Bridges in surgery. Mammogram on 06/16/17 showed suspicious mass in 230 o'clock location of left breast. Biopsy on 06/23/17 showed invasive ductal carcinoma at 200 o'clock position with characteristics as described above in the diagnosis.  Patient proceeded to left partial mastectomy with sentinel node biopsy on 07/31/17 done by Dr. Bridges. Pathology confirmed invasive ductal carcinoma, Grade 3, spanning 4.5 cm. The carcinoma was found within 0.1 cm of the anterior margin broadly. 0/5 LNs were positive for malignancy.  The patient is being followed by Dr. Perlov. His most recent  note suggests the patient is disinterested in chemotherapy. She admits today that her anxiety was influential in her decision that day.   The patient states her orthopedic surgeon, Dr. Esther with [facility], is concerned for potential dangers several months from now when lipoma could begin threatening ability to preserve her left arm. I will contact Dr. Esther to verify if this is accurate.  Of note, the patient states she has a lipoma at left shoulder and plans to have this removed after she completes treatment. She reports the lipoma caused diminished arm mobility on left. She endorses numbness to her incision site. She endorses chronic pain to her bilateral legs and knees.  PREVIOUS RADIATION THERAPY: No  PAST MEDICAL HISTORY:  has a past medical history of Anxiety, Asthma, Cancer (HCC), Hemorrhoids, Lipoma of left shoulder, Migraine, Migraine, and Seizures (HCC).    PAST SURGICAL HISTORY: Past Surgical History:  Procedure Laterality Date  . ABDOMINAL HYSTERECTOMY    . APPENDECTOMY    . CESAREAN SECTION    . CHOLECYSTECTOMY    . ERCP    . LIVER SURGERY    . PARTIAL MASTECTOMY WITH AXILLARY SENTINEL LYMPH NODE BIOPSY Left 07/31/2017   Procedure: LEFT PARTIAL MASTECTOMY WITH AXILLARY SENTINEL LYMPH NODE BIOPSY;  Surgeon: Bridges, Lindsay C, MD;  Location: [facility];  Service: General;  Laterality: Left;    FAMILY HISTORY: family history includes Cancer in her father, maternal grandmother, mother, other, paternal grandfather, paternal grandmother, paternal uncle, paternal uncle, and paternal uncle; Cancer (age of onset: 50) in her sister; Diabetes in her brother, sister, sister, and sister.  SOCIAL HISTORY:  reports that she   quit smoking about 37 years ago. She has a 2.25 pack-year smoking history. she has never used smokeless tobacco. She reports that she does not drink alcohol or use drugs.  ALLERGIES: Onion; Penicillins; Sulfa antibiotics; Yellow dyes (non-tartrazine); and  Clindamycin/lincomycin  MEDICATIONS:  Current Outpatient Medications  Medication Sig Dispense Refill  . albuterol (PROVENTIL HFA;VENTOLIN HFA) 108 (90 Base) MCG/ACT inhaler Inhale 2 puffs into the lungs every 6 (six) hours as needed for wheezing or shortness of breath.    . Bioflavonoid Products (BIOFLEX) TABS Take 1 tablet by mouth daily.    . Hypromellose (ARTIFICIAL TEARS OP) Apply to eye 2 (two) times daily. Doesn't do drops in eyes does as a wash to flush eyes out    . lidocaine (LIDODERM) 5 % Place 0.5-1 patches onto the skin daily as needed (pain). Remove & Discard patch within 12 hours or as directed by MD    . Menthol, Topical Analgesic, (BIOFREEZE EX) Apply 1 application topically as needed (pain).    . ALPRAZolam (XANAX) 1 MG tablet Take 1 tablet (1 mg total) by mouth daily as needed for anxiety. 30 tablet 0  . ibuprofen (ADVIL,MOTRIN) 800 MG tablet Take 800 mg by mouth every 8 (eight) hours as needed.    . propranolol ER (INDERAL LA) 80 MG 24 hr capsule TAKE 1 CAPSULE BY MOUTH ONCE DAILY (Patient not taking: Reported on 09/29/2017) 30 capsule 1   No current facility-administered medications for this encounter.     REVIEW OF SYSTEMS: A 10+ POINT REVIEW OF SYSTEMS WAS OBTAINED including neurology, dermatology, psychiatry, cardiac, respiratory, lymph, extremities, GI, GU, Musculoskeletal, constitutional, breasts, reproductive, HEENT.  All pertinent positives are noted in the HPI.  All others are negative.   PHYSICAL EXAM:  height is 5' 8" (1.727 m) and weight is 194 lb 3.2 oz (88.1 kg). Her temperature is 97.8 F (36.6 C). Her blood pressure is 131/98 (abnormal) and her pulse is 78.   General: Alert and oriented, in no acute distress HEENT: Head is normocephalic. Extraocular movements are intact. Oropharynx is clear. Neck: Neck is supple, no palpable cervical or supraclavicular lymphadenopathy. Heart: Regular in rate and rhythm with no murmurs, rubs, or gallops. Chest: Clear to  auscultation bilaterally, with no rhonchi, wheezes, or rales. Abdomen: Soft, nontender, nondistended, with no rigidity or guarding. Extremities: No cyanosis or edema. Lymphatics: see Neck Exam Skin: No concerning lesions. Musculoskeletal: She has a large lipomatous mass growing off of the left shoulder. She has limited range of motion in the left shoulder. Decreased strength bilaterally on hip flexion.  Neurologic: Cranial nerves II through XII are grossly intact. No obvious focalities. Speech is fluent. Coordination is intact. Psychiatric: Judgment and insight are intact. Affect is appropriate. Breasts: The incisions in UOQ of the left breast and left axilla have healed well. No palpable masses appreciated. . No palpable masses appreciated in the breasts or axillae.   ECOG = 1  0 - Asymptomatic (Fully active, able to carry on all predisease activities without restriction)  1 - Symptomatic but completely ambulatory (Restricted in physically strenuous activity but ambulatory and able to carry out work of a light or sedentary nature. For example, light housework, office work)  2 - Symptomatic, <50% in bed during the day (Ambulatory and capable of all self care but unable to carry out any work activities. Up and about more than 50% of waking hours)  3 - Symptomatic, >50% in bed, but not bedbound (Capable of only limited self-care, confined to bed   or chair 50% or more of waking hours)  4 - Bedbound (Completely disabled. Cannot carry on any self-care. Totally confined to bed or chair)  5 - Death   Eustace Pen MM, Creech RH, Tormey DC, et al. [phone]). "Toxicity and response criteria of the [facility]". [place] Oncol. 5 (6): 649-55   LABORATORY DATA:  Lab Results  Component Value Date   WBC 8.5 08/19/2017   HGB 13.0 08/19/2017   HCT 39.8 08/19/2017   MCV 89.8 08/19/2017   PLT 368 08/19/2017   CMP     Component Value Date/Time   NA 139 08/19/2017 1357   K 3.6  08/19/2017 1357   CL 103 08/19/2017 1357   CO2 25 08/19/2017 1357   GLUCOSE 92 08/19/2017 1357   BUN 16 08/19/2017 1357   CREATININE 0.86 08/19/2017 1357   CREATININE 0.71 07/01/2017 1449   CALCIUM 9.2 08/19/2017 1357   PROT 7.9 08/19/2017 1357   ALBUMIN 4.1 08/19/2017 1357   AST 26 08/19/2017 1357   ALT 22 08/19/2017 1357   ALKPHOS 106 08/19/2017 1357   BILITOT 0.7 08/19/2017 1357   GFRNONAA >60 08/19/2017 1357   GFRAA >60 08/19/2017 1357         RADIOGRAPHY:  As above    IMPRESSION/PLAN: left breast cancer, high risk  It was a pleasure meeting the patient today. She had initially declined chemotherapy but upon further discussion with me about the importance of systemic therapy for her high risk disease, she  is now amenable to it. I have emailed Dr. Sherlynn Stalls and Dr. Burr Medico regarding the patient's concerns for her left arm given the lipoma on her shoulder. I referred her to Dr. Burr Medico based on her interest receiving chemotherapy and radiation here in [place].   We discussed the risks, benefits, and side effects of radiotherapy. I recommend radiotherapy to the left breast to reduce her risk of locoregional recurrence by 2/3.  We discussed that radiation would take approximately 4 weeks to complete and that I would give the patient a few weeks to heal following surgery before starting treatment planning. If chemotherapy were to be given, this would precede radiotherapy. We spoke about acute effects including skin irritation and fatigue as well as much less common late effects including internal organ injury or irritation. We spoke about the latest technology that is used to minimize the risk of late effects for patients undergoing radiotherapy to the breast or chest wall. No guarantees of treatment were given. The patient is enthusiastic about proceeding with treatment. I look forward to participating in the patient's care.  I will await her referral back to me for postoperative follow-up and  eventual CT simulation/treatment planning - she understands this would FOLLOW chemotherapy, months from now.  I spent at least 70 minutes  face to face with the patient and more than 50% of that time was spent in counseling and/or coordination of care.     __________________________________________   Eppie Gibson, MD   This document serves as a record of services personally performed by Eppie Gibson, MD. It was created on his behalf by Linward Natal, a trained medical scribe. The creation of this record is based on the scribe's personal observations and the provider's statements to them. This document has been checked and approved by the attending provider.

## 2017-09-30 ENCOUNTER — Telehealth: Payer: Self-pay

## 2017-09-30 ENCOUNTER — Encounter: Payer: Self-pay | Admitting: Hematology

## 2017-09-30 ENCOUNTER — Encounter: Payer: Self-pay | Admitting: Radiation Oncology

## 2017-09-30 ENCOUNTER — Inpatient Hospital Stay: Payer: Medicare Other | Attending: Hematology | Admitting: Hematology

## 2017-09-30 VITALS — BP 150/101 | HR 108 | Temp 98.0°F | Resp 18 | Ht 68.0 in | Wt 195.5 lb

## 2017-09-30 DIAGNOSIS — Z803 Family history of malignant neoplasm of breast: Secondary | ICD-10-CM | POA: Diagnosis not present

## 2017-09-30 DIAGNOSIS — F419 Anxiety disorder, unspecified: Secondary | ICD-10-CM | POA: Diagnosis not present

## 2017-09-30 DIAGNOSIS — Z8041 Family history of malignant neoplasm of ovary: Secondary | ICD-10-CM | POA: Diagnosis not present

## 2017-09-30 DIAGNOSIS — Z5111 Encounter for antineoplastic chemotherapy: Secondary | ICD-10-CM

## 2017-09-30 DIAGNOSIS — C50412 Malignant neoplasm of upper-outer quadrant of left female breast: Secondary | ICD-10-CM | POA: Insufficient documentation

## 2017-09-30 DIAGNOSIS — Z171 Estrogen receptor negative status [ER-]: Secondary | ICD-10-CM | POA: Diagnosis not present

## 2017-09-30 MED ORDER — ONDANSETRON HCL 8 MG PO TABS: 8.0000 mg | ORAL_TABLET | Freq: Two times a day (BID) | ORAL | 1 refills | Status: DC | PRN

## 2017-09-30 MED ORDER — LIDOCAINE-PRILOCAINE 2.5-2.5 % EX CREA: TOPICAL_CREAM | CUTANEOUS | 3 refills | Status: DC

## 2017-09-30 MED ORDER — PROCHLORPERAZINE MALEATE 10 MG PO TABS: 10.0000 mg | ORAL_TABLET | Freq: Four times a day (QID) | ORAL | 1 refills | Status: DC | PRN

## 2017-09-30 MED ORDER — ALPRAZOLAM 1 MG PO TABS: 1.0000 mg | ORAL_TABLET | Freq: Every day | ORAL | 0 refills | Status: DC | PRN

## 2017-09-30 NOTE — Progress Notes (Signed)
Mary Wise  [phone] [phone]  Clinic Follow up Note   Patient Care Team: [name], MD as PCP - General (Family Medicine) 09/30/2017   CHIEF COMPLAINT: F/u triple negative breast cancer   SUMMARY OF ONCOLOGIC HISTORY: Oncology History   Cancer Staging Malignant neoplasm of upper-outer quadrant of left breast in female, estrogen receptor negative ([place]) Staging form: Breast, AJCC 8th Edition - Pathologic stage from 08/19/2017: Stage IIA (pT2, pN0(sn), cM0, G3, ER: Negative, PR: Negative, HER2: Negative) - Signed by Ardath Sax, MD on 09/01/2017       Malignant neoplasm of upper-outer quadrant of left breast in female, estrogen receptor negative ([place])   06/16/2017 Initial Diagnosis    Malignant neoplasm of upper-outer quadrant of left breast in female: Initially presented with left deltoid mass which was consistent with lipoma.  During examination, a mass in the left breast was appreciated by palpation.      06/16/2017 Mammogram    BL Diagnostic Study: Irregular hyperdense mass in the upper outer quadrant of the left breast measuring 3.6 cm without radiographically detectable lymphadenopathy.  Normal appearance of the right breast.      06/16/2017 Breast US    3.2 cm irregular mass located at 2:30 o'clock, no pathological lymphadenopathy.      06/23/2017 Pathology Results    Ultrasound-guided biopsy: Positive for invasive ductal carcinoma, grade 3, ER 0%, PR 0%, HER-2 negative by [facility]      07/31/2017 Surgery    Partial left mastectomy with sentinel lymph node biopsy --Pathology: Invasive ductal carcinoma, measuring 4.5 cm, grade 3.  All margins negative, but the anterior margin less than 0.1 cm.  No surrounding DCIS.  0/5 lymph nodes positive for malignancy      HISTORY OF PRESENT ILLNESS: Mary Wise 67 y.o. female presents to the [facility] for follow up her left breast cancer that has recently been  surgically resected. The patient was referred by Dr. Isidore Moos.  She was previously seen by my partner Dr. Lebron Conners at [facility].  Patient states she noticed a lump in her left breast after she had knocked into her bed and felt pain in the area. 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 She saw her PCP and during the evaluation, a mass was discovered in the lateral aspect of the left breast leading to additional evaluation. She had been getting regular mammograms yearly before this with one abnormal result in the past that revealed fatty tissue.  Her diagnostic mammogram from 06/16/17 revealed a suspicious mass in the 230 o'clock location of the left breast with her initial biopsy confirming invasive ductal carcinoma. Prognostic indicators significant for: ER, 0% negative and PR, 0% negative. Proliferation marker Ki67 at 90%. HER2 negative. Pt underwent a left partial mastectomy with sentinel node biopsy with Dr. Constance Haw on 07/31/17. Surgical pathology results revealed invasive ductal carcinoma and DCIS, grade III, spanning 4.5 cm. Lymph nodes biopsied were negative.  She met Dr. Lebron Conners one month ago, adjuvant chemotherapy was recommended, but she declined.  She was seen by radiation oncologist Dr. Isidore Moos yesterday, who encouraged her to consider adjuvant chemotherapy, and referred her to me.  In the past she was diagnosed with medical conditions of anxiety, asthma, seizure and migraines. She has had gallstones that traveled to liver that required surgery, appendectomy, and complete hysterectomy.   She has a FHx of breast cancer, brain cancer,  ovarian cancer, throat cancer, and lung cancer. She has a Hx of tobacco use for 9 years over 30 years ago. She  GYN HISTORY  Menarchal: 12 LMP: 65, complete hysterectomy  Contraceptive: HRT:  GP: 1 daughter, Joelene Millin, she works in the ED at [employer] she is divorced and lives by herself in  an assisted living facility in [place]. She states she is able to do everything for herself. She enjoys the company of people and she is able to get meals there.   CURRENT THERAPY: Pending adjuvant chemotherapy ddAC every 2 weeks for 4 cycles, followed by  bi-weekly Taxol for 4 cycles (or weekly for 12 weeks), starting on 10/12/17  INTERVAL HISTORY: Mary Wise is here for follow up. She presents to the clinic today accompanied by her daughter. She reports that she has been recovering well from surgery. She was seen by Radiation Oncology, Dr. Isidore Moos yesterday 09/29/17 and she states she is sore from the examination.   She states she has been nervous because of this diagnosis. She takes Xanax and states she takes it about 3 times per week.   On review of systems, pt denies depression, irregular bowel movement or any other complaints at this time. Pertinent positives are listed and detailed within the above HPI.   REVIEW OF SYSTEMS:   Constitutional: Denies fevers, chills or abnormal weight loss Eyes: Denies blurriness of vision Ears, nose, mouth, throat, and face: Denies mucositis or sore throat Respiratory: Denies cough, dyspnea or wheezes Cardiovascular: Denies palpitation, chest discomfort or lower extremity swelling Gastrointestinal:  Denies nausea, heartburn or change in bowel habits Skin: Denies abnormal skin rashes Lymphatics: Denies new lymphadenopathy or easy bruising Neurological:Denies numbness, tingling or new weaknesses Behavioral/Psych:  (+) anxiety Extremity: (+) lipoma of the upper left arm  All other systems were reviewed with the patient and are negative.  MEDICAL HISTORY:  Past Medical History:  Diagnosis Date  . Anxiety   . Asthma   . Cancer [facility])    left breast cancer  . Hemorrhoids   . Lipoma of left shoulder   . Migraine   . Migraine   . 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 ABDOMINAL HYSTERECTOMY    . APPENDECTOMY    . CESAREAN SECTION    . CHOLECYSTECTOMY    . ERCP    . LIVER SURGERY    . PARTIAL MASTECTOMY WITH AXILLARY SENTINEL LYMPH NODE BIOPSY Left 07/31/2017   Procedure: LEFT PARTIAL MASTECTOMY WITH AXILLARY SENTINEL LYMPH NODE BIOPSY;  Surgeon: Virl Cagey, MD;  Location: [facility];  Service: General;  Laterality: Left;    I have reviewed the social history and family history with the patient and they are unchanged from previous note.  ALLERGIES:  is allergic to onion; penicillins; sulfa antibiotics; yellow dyes (non-tartrazine); and clindamycin/lincomycin.  MEDICATIONS:  Current Outpatient Medications  Medication Sig Dispense Refill  . albuterol (PROVENTIL HFA;VENTOLIN HFA) 108 (90 Base) MCG/ACT inhaler Inhale 2 puffs into the lungs every 6 (six) hours as needed for wheezing or shortness of breath.    . ALPRAZolam (XANAX) 1 MG tablet Take 1 tablet (1 mg total) by mouth daily as needed for anxiety. 30 tablet 0  . Bioflavonoid Products (BIOFLEX) TABS Take 1 tablet by mouth daily.    . Hypromellose (ARTIFICIAL TEARS OP) Apply to eye 2 (two) times daily. Doesn't do drops in eyes  does as a wash to flush eyes out    . ibuprofen (ADVIL,MOTRIN) 800 MG tablet Take 800 mg by mouth every 8 (eight) hours as needed.    . lidocaine (LIDODERM) 5 % Place 0.5-1 patches onto the skin daily as needed (pain). Remove & Discard patch within 12 hours or as directed by MD    . Menthol, Topical Analgesic, (BIOFREEZE EX) Apply 1 application topically as needed (pain).    . propranolol ER (INDERAL LA) 80 MG 24 hr capsule TAKE 1 CAPSULE BY MOUTH ONCE DAILY (Patient not taking: Reported on 09/29/2017) 30 capsule 1   No current facility-administered medications for this visit.     PHYSICAL EXAMINATION: ECOG PERFORMANCE STATUS: 0 - Asymptomatic  Vitals:   09/30/17 1453  BP: (!) 150/101  Pulse: (!) 108  Resp: 18  Temp: 98 F (36.7 C)  SpO2: 94%    Filed Weights   09/30/17 1453  Weight: 195 lb 8 oz (88.7 kg)    GENERAL:alert, no distress and comfortable SKIN: skin color, texture, turgor are normal, no rashes or significant lesions EYES: normal, Conjunctiva are pink and non-injected, sclera clear OROPHARYNX:no exudate, no erythema and lips, buccal mucosa, and tongue normal  NECK: supple, thyroid normal size, non-tender, without nodularity LYMPH:  no palpable lymphadenopathy in the cervical, axillary or inguinal LUNGS: clear to auscultation and percussion with normal breathing effort HEART: regular rate & rhythm and no murmurs and no lower extremity edema ABDOMEN:abdomen soft, non-tender and normal bowel sounds. No hepatomegaly  Musculoskeletal:no cyanosis of digits and no clubbing  NEURO: alert & oriented x 3 with fluent speech, no focal motor/sensory deficits EXTREMITY: left upper extremity with 14 cm by 11 cm subcutaneous mass  BREAST: surgical scar in the upper outer quadrant of left breast and axilla, which are all well healed. There is no palpable mass in bilateral breasts and axilla.  LABORATORY DATA:  I have reviewed the data as listed CBC Latest Ref Rng & Units 08/19/2017 07/23/2017 07/01/2017  WBC 4.0 - 10.5 K/uL 8.5 7.4 7.1  Hemoglobin 12.0 - 15.0 g/dL 13.0 12.9 13.7  Hematocrit 36.0 - 46.0 % 39.8 39.7 41.4  Platelets 150 - 400 K/uL 368 325 375     CMP Latest Ref Rng & Units 08/19/2017 07/23/2017 07/01/2017  Glucose 65 - 99 mg/dL 92 90 85  BUN 6 - 20 mg/dL _0 Creatinine 0.44 - 1.00 mg/dL 0.86 0.71 0.71  Sodium 135 - 145 mmol/L 139 138 139  Potassium 3.5 - 5.1 mmol/L 3.6 3.5 3.9  Chloride 101 - 111 mmol/L 103 103 100  CO2 22 - 32 mmol/L _1 Calcium 8.9 - 10.3 mg/dL 9.2 9.3 10.6(H)  Total Protein 6.5 - 8.1 g/dL 7.9 - 7.6  Total Bilirubin 0.3 - 1.2 mg/dL 0.7 - 0.3  Alkaline Phos 38 - 126 U/L 106 - -  AST 15 - 41 U/L 26 - 27  ALT 14 - 54 U/L 22 - 28   PATHOLOGY  Initial biopsy  Diagnosis  07/02/17 Breast, left, needle core biopsy, 2:00 - INVASIVE DUCTAL CARCINOMA. - SEE COMMENT. Microscopic Comment The carcinoma appears grade III. A breast prognostic profile will be performed and the results reported separately. The results were called to [facility] on 06/24/2017. (JBK:ecj 06/24/2017) Results: HER2 - NEGATIVE RATIO OF HER2/CEP17 SIGNALS 1.60 AVERAGE HER2 COPY NUMBER PER CELL 3.60 Results: IMMUNOHISTOCHEMICAL AND MORPHOMETRIC ANALYSIS PERFORMED MANUALLY Estrogen Receptor: 0%, NEGATIVE Progesterone Receptor: 0%, NEGATIVE Proliferation Marker  Ki67: 90%  Surgical pathology  Diagnosis 07/31/18 1. Lymph node, sentinel, biopsy, left - ONE OF ONE LYMPH NODES NEGATIVE FOR CARCINOMA (0/1). 2. 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 - INVASIVE CARCINOMA IS <0.1 CM OF THE ANTERIOR MARGIN BROADLY. - SEE ONCOLOGY TABLE. 3. Breast, excision, left deep - BENIGN BREAST TISSUE. 4. Breast, excision, left superior - BENIGN BREAST TISSUE. 5. Breast, excision, left medial - BENIGN BREAST TISSUE. 6. Breast, excision, left inferior - BENIGN BREAST TISSUE. 7. Breast, excision, left lateral - BENIGN BREAST TISSUE. 8. Lymph node, sentinel, biopsy - ONE OF ONE LYMPH NODES NEGATIVE FOR CARCINOMA (0/1). 9. Lymph node, sentinel, biopsy - ONE OF ONE LYMPH NODES NEGATIVE FOR CARCINOMA (0/1). 10. Lymph node, sentinel, biopsy - ONE OF ONE LYMPH NODES NEGATIVE FOR CARCINOMA (0/1). 11. Lymph node, sentinel, biopsy - ONE OF ONE LYMPH NODES NEGATIVE FOR CARCINOMA (0/1). Microscopic Comment 2. BREAST, INVASIVE TUMOR Procedure: Left partial mastectomy with additional margins and left sentinel lymph node biopsies. Laterality: Left. 1 of 4 FINAL for TINIA, ORAVEC ([id number]) Microscopic Comment(continued) Tumor Size: 4.5 cm. Histologic Type: Invasive ductal carcinoma. Grade: 3 Tubular Differentiation: 2 Nuclear  Pleomorphism: 3 Mitotic Count: 3 Ductal Carcinoma in Situ (DCIS): Not identified. Extent of Tumor: Confined to breast parenchyma. Margins: Invasive carcinoma, distance from closest margin: <0.1 cm of anterior margin broadly. Remaining final margins (parts 3-7) are >1 cm. DCIS, distance from closest margin: N/A. Regional Lymph Nodes: Number of Lymph Nodes Examined: 5 Number of Sentinel Lymph Nodes Examined: 5 Lymph Nodes with Macrometastases: 0 Lymph Nodes with Micrometastases: 0 Lymph Nodes with Isolated Tumor Cells: 0 Breast Prognostic Profile: Performed on biopsy [id number], see below. Will not be repeated. Estrogen Receptor: Negative. Progesterone Receptor: Negative. Her2: Negative (ratio 1.60). Ki-67: 90%. Best tumor block for sendout testing: 2D Pathologic Stage Classification (pTNM, AJCC 8th Edition): Primary Tumor (pT): pT2 Regional Lymph Nodes (pN): pN0 Distant Metastases (pM): pMX Comments: None.  RADIOGRAPHIC STUDIES: I have personally reviewed the radiological images as listed and agreed with the findings in the report. No results found.   Diagnostic Mammogram and [place] 06/16/17 IMPRESSION: Suspicious mass in the 230 o'clock location of the left breast. Tissue diagnosis is recommended. No evidence for adenopathy.  ASSESSMENT & PLAN:  No problem-specific Assessment & Plan notes found for this encounter. Mary Wise 67 y.o. 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  1. 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  Proliferation marker Ki67 at 90% -I discussed her breast imaging and surgical pathology results with patient and her daughter in great detail. Her diagnostic mammogram from 06/16/17 revealed a suspicious mass in the 230 o'clock location of the left breast with her initial biopsy confirming invasive ductal carcinoma.  - Pt  underwent a left partial mastectomy with sentinel node biopsy with Dr. Constance Haw on 07/31/17. Surgical pathology results revealed invasive ductal carcinoma, grade III, spanning 4.5 cm. 5 lymph nodes biopsied were negative.  -Given her strong family history of cancer, we recommend her to undergo genetic testing to ruled out inheritable breast cancer. She agrees.  - She is at high risk for recurrence due to her Stage II triple negative disease which is more aggressive than ER PR positive disease. Her estimated risk for distant recurrence is about 30-35% -I strongly recommend her to consider adjuvant chemotherapy to reduce her risk  of recurrence. I recommend 2 months of adriamycin and cytoxan followed by bi-weekly taxol for 2 months. I also discussed and offer the option of docetaxel and Cytoxan (TC) every 3 weeks for 4 cycles. I discussed side effects of these specific regimens. She is agreeable with the 1st option AC-T. -Chemotherapy consent: Side effects including but does not not limited to, fatigue, nausea, vomiting, diarrhea, hair loss, neuropathy, fluid retention, renal and kidney dysfunction, neutropenic fever, needed for blood transfusion, bleeding, were discussed with patient in great detail. She agrees to proceed. -The goal of therapy is curative. -She will need an ECHO and a port placement prior to starting chemo.  -I will get a PET Scan to rule out distant metastasis. If insurance does not approve, I will get a CT CAP W Contrast and Whole Body Bone Scan. -She will attend chemo class soon. I would like her to start chemo within 2 weeks due to agressiveness of her cancer.  -Dr. Sherlynn Stalls and Dr. Isidore Moos will coordinate when she will have her lipoma removed.  -Given her negative ER and PR, she is not a candidate for antiestrogen therapy  -She will benefit from adjuvant breast radiation because her lumpectomy, and close distance margin. She was seen by Dr. Isidore Moos on 09/29/17 and is agreeable.  -I spoke  with [facility], navigator today and she came to speak to pt.  -F/u on 3/11 for 1st day of treatment -I will also screen her for the [id number] trial to see if she is an candidate   2. Anxiety -She takes Xanax PRN but has found herself using it more often since her Breast Cancer diagnosis. -I refilled for her today   3. Large left upper arm lipoma  -She has a large lipoma in the upper arm/should area, she was seen by Dr. Sherlynn Stalls at [facility].  -Dr. Sherlynn Stalls plan to have surgical resection after she completes chemo, and he will coordinate with her radiation with Dr. Isidore Moos.  4. Genetics -She has strong family history of breast, ovarian cancer and brain tumor, I strongly recommend her to have genetic testing to rule out inheritable genetic syndrome.  She agrees, I will refer her today.  Plan -Genetic refer -chemo class 3/1 -ECHO and port placement, I will reach out to Dr. Constance Haw  -PET Scan or CT CAP/Whole Body Bone Scan -Plan to start adriamycin and cytoxan on 10/12/17 -f/u at first cycle chemo  -Refilled Xanax     Orders Placed This Encounter  Procedures  . NM PET Image Initial (PI) Skull Base To Thigh    Standing Status:   Future    Standing Expiration Date:   09/30/2018    Order Specific Question:   If indicated for the ordered procedure, I authorize the administration of a radiopharmaceutical per Radiology protocol    Answer:   Yes    Order Specific Question:   Preferred imaging location?    Answer:   [facility]    Order Specific Question:   Radiology Contrast Protocol - do NOT remove file path    Answer:   [email or web address]  . Ambulatory referral to Genetics    Referral Priority:   Routine    Referral Type:   Consultation    Referral Reason:   Specialty Services Required    Number of Visits Requested:   1  . ECHOCARDIOGRAM COMPLETE    Standing Status:   Future    Standing Expiration Date:   12/29/2018    Scheduling Instructions:  Within one week,  chemo evaluation    Order Specific Question:   Where should this test be performed    Answer:   [place]    Order Specific Question:   Perflutren DEFINITY (image enhancing agent) should be administered unless hypersensitivity or allergy exist    Answer:   Administer Perflutren    Order Specific Question:   Expected Date:    Answer:   ASAP   All questions were answered. The patient knows to call the clinic with any problems, questions or concerns. No barriers to learning was detected. I spent 50 minutes counseling the patient face to face. The total time spent in the appointment was 70 minutes and more than 50% was on counseling and review of test results  This document serves as a record of services personally performed by Truitt Merle, MD. It was created on her behalf by Theresia Bough, a trained medical scribe. The creation of this record is based on the scribe's personal observations and the provider's statements to them.   I have reviewed the above documentation for accuracy and completeness, and I agree with the above.     Truitt Merle, MD 09/30/17 7:32 PM

## 2017-09-30 NOTE — Progress Notes (Signed)
START ON PATHWAY REGIMEN - Breast   Doxorubicin + Cyclophosphamide [facility]):   A cycle is every 21 days:     Doxorubicin      Cyclophosphamide   **Always confirm dose/schedule in your pharmacy ordering system**    Paclitaxel 80 mg/m2 Weekly:   Administer weekly:     Paclitaxel   **Always confirm dose/schedule in your pharmacy ordering system**    Patient Characteristics: Postoperative without Neoadjuvant Therapy (Pathologic Staging), Invasive Disease, Adjuvant Therapy, HER2 Negative/Unknown/Equivocal, ER Negative/Unknown, Node Negative, pT1a-c, pN0/N23m or pT2 or Higher, pN0 Therapeutic Status: Postoperative without Neoadjuvant Therapy (Pathologic Staging) AJCC Grade: G3 AJCC N Category: pN0 AJCC M Category: cM0 ER Status: Negative (-) AJCC 8 Stage Grouping: IIA HER2 Status: Negative (-) Oncotype Dx Recurrence Score: Not Appropriate AJCC T Category: pT2 PR Status: Negative (-) Intent of Therapy: Curative Intent, Discussed with Patient

## 2017-09-30 NOTE — Telephone Encounter (Signed)
Printed avs and calender of upcoming appointment. Per 2/27 los 

## 2017-10-01 ENCOUNTER — Ambulatory Visit: Payer: Medicare Other | Admitting: Family Medicine

## 2017-10-01 ENCOUNTER — Telehealth: Payer: Self-pay | Admitting: *Deleted

## 2017-10-01 ENCOUNTER — Encounter: Payer: Self-pay | Admitting: Hematology

## 2017-10-01 NOTE — Progress Notes (Signed)
Submitted auth request for Ondansetron and Lidocaine-Prilocaine cream today.  Status is pending for both.

## 2017-10-01 NOTE — Telephone Encounter (Signed)
THIS PATIENT WAS SEEN BY DR. Burr Medico ON 09/30/17

## 2017-10-01 NOTE — Addendum Note (Signed)
Addended by: Truitt Merle on: 10/01/2017 04:25 PM   Modules accepted: Orders

## 2017-10-02 ENCOUNTER — Telehealth: Payer: Self-pay

## 2017-10-02 ENCOUNTER — Other Ambulatory Visit: Payer: Self-pay | Admitting: Hematology

## 2017-10-02 ENCOUNTER — Encounter: Payer: Self-pay | Admitting: Hematology

## 2017-10-02 MED ORDER — LIDOCAINE-PRILOCAINE 2.5-2.5 % EX CREA: 1.0000 "application " | TOPICAL_CREAM | CUTANEOUS | 2 refills | Status: DC | PRN

## 2017-10-02 NOTE — Progress Notes (Signed)
Pt's Ondansetron was approved from 08/02/17 to 08/03/18.  ° ° °

## 2017-10-02 NOTE — Telephone Encounter (Signed)
Called pt and left VM to inform her of her appointment to have her port placed. Informed her it is scheduled At [facility] on Monday 3/4. She is to arrive at 1030 at the main entrance and go to admissions. Nothing to eat or drink past 6 am although she may take her am medications with sips of water. Informed her she will need a driver and to please call be back so we can confirm this over the phone.  Cyndia Bent RN

## 2017-10-05 ENCOUNTER — Encounter (HOSPITAL_COMMUNITY): Payer: Self-pay

## 2017-10-05 ENCOUNTER — Ambulatory Visit (HOSPITAL_COMMUNITY)
Admission: RE | Admit: 2017-10-05 | Discharge: 2017-10-05 | Disposition: A | Discharge status: Home / Self Care | Payer: Medicare Other | Source: Ambulatory Visit | Attending: Hematology | Admitting: Hematology

## 2017-10-05 ENCOUNTER — Telehealth: Payer: Self-pay | Admitting: Hematology

## 2017-10-05 ENCOUNTER — Other Ambulatory Visit: Payer: Self-pay | Admitting: Hematology

## 2017-10-05 ENCOUNTER — Encounter: Payer: Self-pay | Admitting: Hematology

## 2017-10-05 DIAGNOSIS — Z880 Allergy status to penicillin: Secondary | ICD-10-CM | POA: Diagnosis not present

## 2017-10-05 DIAGNOSIS — G43909 Migraine, unspecified, not intractable, without status migrainosus: Secondary | ICD-10-CM | POA: Insufficient documentation

## 2017-10-05 DIAGNOSIS — C50412 Malignant neoplasm of upper-outer quadrant of left female breast: Secondary | ICD-10-CM | POA: Diagnosis not present

## 2017-10-05 DIAGNOSIS — Z87891 Personal history of nicotine dependence: Secondary | ICD-10-CM | POA: Diagnosis not present

## 2017-10-05 DIAGNOSIS — C50912 Malignant neoplasm of unspecified site of left female breast: Secondary | ICD-10-CM | POA: Diagnosis not present

## 2017-10-05 DIAGNOSIS — Z882 Allergy status to sulfonamides status: Secondary | ICD-10-CM | POA: Diagnosis not present

## 2017-10-05 DIAGNOSIS — F419 Anxiety disorder, unspecified: Secondary | ICD-10-CM | POA: Diagnosis not present

## 2017-10-05 DIAGNOSIS — Z171 Estrogen receptor negative status [ER-]: Principal | ICD-10-CM

## 2017-10-05 DIAGNOSIS — Z5111 Encounter for antineoplastic chemotherapy: Secondary | ICD-10-CM | POA: Diagnosis not present

## 2017-10-05 DIAGNOSIS — J45909 Unspecified asthma, uncomplicated: Secondary | ICD-10-CM | POA: Insufficient documentation

## 2017-10-05 DIAGNOSIS — Z803 Family history of malignant neoplasm of breast: Secondary | ICD-10-CM | POA: Insufficient documentation

## 2017-10-05 DIAGNOSIS — Z452 Encounter for adjustment and management of vascular access device: Secondary | ICD-10-CM | POA: Diagnosis not present

## 2017-10-05 HISTORY — PX: IR US GUIDE VASC ACCESS RIGHT: IMG2390

## 2017-10-05 HISTORY — PX: IR FLUORO GUIDE PORT INSERTION RIGHT: IMG5741

## 2017-10-05 LAB — CBC
HCT: 40.2 % (ref 36.0–46.0)
HEMOGLOBIN: 13.1 g/dL (ref 12.0–15.0)
MCH: 29.3 pg (ref 26.0–34.0)
MCHC: 32.6 g/dL (ref 30.0–36.0)
MCV: 89.9 fL (ref 78.0–100.0)
PLATELETS: 337 10*3/uL (ref 150–400)
RBC: 4.47 MIL/uL (ref 3.87–5.11)
RDW: 13.6 % (ref 11.5–15.5)
WBC: 6 10*3/uL (ref 4.0–10.5)

## 2017-10-05 LAB — PROTIME-INR
INR: 0.98
PROTHROMBIN TIME: 12.9 s (ref 11.4–15.2)

## 2017-10-05 LAB — APTT: APTT: 26 s (ref 24–36)

## 2017-10-05 MED ORDER — SODIUM CHLORIDE 0.9 % IV SOLN
INTRAVENOUS | Status: DC
  Administered 2017-10-05: 10:00:00 via INTRAVENOUS

## 2017-10-05 MED ORDER — MIDAZOLAM HCL 2 MG/2ML IJ SOLN
INTRAMUSCULAR | Status: AC
  Filled 2017-10-05: qty 2

## 2017-10-05 MED ORDER — NALOXONE HCL 0.4 MG/ML IJ SOLN
INTRAMUSCULAR | Status: AC
  Filled 2017-10-05: qty 1

## 2017-10-05 MED ORDER — HEPARIN SOD (PORK) LOCK FLUSH 100 UNIT/ML IV SOLN
INTRAVENOUS | Status: AC
  Administered 2017-10-05: 500 [IU]
  Filled 2017-10-05: qty 5

## 2017-10-05 MED ORDER — VANCOMYCIN HCL IN DEXTROSE 1-5 GM/200ML-% IV SOLN
INTRAVENOUS | Status: AC
  Filled 2017-10-05: qty 200

## 2017-10-05 MED ORDER — FENTANYL CITRATE (PF) 100 MCG/2ML IJ SOLN
INTRAMUSCULAR | Status: AC | PRN
  Administered 2017-10-05 (×3): 25 ug via INTRAVENOUS
  Administered 2017-10-05: 50 ug via INTRAVENOUS
  Administered 2017-10-05: 25 ug via INTRAVENOUS

## 2017-10-05 MED ORDER — LIDOCAINE HCL 1 % IJ SOLN
INTRAMUSCULAR | Status: AC
  Filled 2017-10-05: qty 20

## 2017-10-05 MED ORDER — LIDOCAINE HCL (PF) 1 % IJ SOLN
INTRAMUSCULAR | Status: AC | PRN
  Administered 2017-10-05: 30 mL

## 2017-10-05 MED ORDER — FLUMAZENIL 0.5 MG/5ML IV SOLN
INTRAVENOUS | Status: AC
  Filled 2017-10-05: qty 5

## 2017-10-05 MED ORDER — VANCOMYCIN HCL IN DEXTROSE 1-5 GM/200ML-% IV SOLN
1000.0000 mg | INTRAVENOUS | Status: AC
  Administered 2017-10-05: 1000 mg via INTRAVENOUS

## 2017-10-05 MED ORDER — MIDAZOLAM HCL 2 MG/2ML IJ SOLN
INTRAMUSCULAR | Status: AC | PRN
  Administered 2017-10-05: 0.5 mg via INTRAVENOUS
  Administered 2017-10-05: 1 mg via INTRAVENOUS
  Administered 2017-10-05: 0.5 mg via INTRAVENOUS
  Administered 2017-10-05: 1 mg via INTRAVENOUS

## 2017-10-05 MED ORDER — FENTANYL CITRATE (PF) 100 MCG/2ML IJ SOLN
INTRAMUSCULAR | Status: AC
  Filled 2017-10-05: qty 2

## 2017-10-05 NOTE — Sedation Documentation (Signed)
Patient denies pain and is resting comfortably.  

## 2017-10-05 NOTE — Discharge Instructions (Addendum)
Implanted Port Home Guide °An implanted port is a type of central line that is placed under the skin. Central lines are used to provide IV access when treatment or nutrition needs to be given through a person’s veins. Implanted ports are used for long-term IV access. An implanted port may be placed because: °· You need IV medicine that would be irritating to the small veins in your hands or arms. °· You need long-term IV medicines, such as antibiotics. °· You need IV nutrition for a long period. °· You need frequent blood draws for lab tests. °· You need dialysis. ° °Implanted ports are usually placed in the chest area, but they can also be placed in the upper arm, the abdomen, or the leg. An implanted port has two main parts: °· Reservoir. The reservoir is round and will appear as a small, raised area under your skin. The reservoir is the part where a needle is inserted to give medicines or draw blood. °· Catheter. The catheter is a thin, flexible tube that extends from the reservoir. The catheter is placed into a large vein. Medicine that is inserted into the reservoir goes into the catheter and then into the vein. ° °How will I care for my incision site? °Do not get the incision site wet. Bathe or shower as directed by your health care provider. °How is my port accessed? °Special steps must be taken to access the port: °· Before the port is accessed, a numbing cream can be placed on the skin. This helps numb the skin over the port site. °· Your health care provider uses a sterile technique to access the port. °? Your health care provider must put on a mask and sterile gloves. °? The skin over your port is cleaned carefully with an antiseptic and allowed to dry. °? The port is gently pinched between sterile gloves, and a needle is inserted into the port. °· Only "non-coring" port needles should be used to access the port. Once the port is accessed, a blood return should be checked. This helps ensure that the port  is in the vein and is not clogged. °· If your port needs to remain accessed for a constant infusion, a clear (transparent) bandage will be placed over the needle site. The bandage and needle will need to be changed every week, or as directed by your health care provider. °· Keep the bandage covering the needle clean and dry. Do not get it wet. Follow your health care provider’s instructions on how to take a shower or bath while the port is accessed. °· If your port does not need to stay accessed, no bandage is needed over the port. ° °What is flushing? °Flushing helps keep the port from getting clogged. Follow your health care provider’s instructions on how and when to flush the port. Ports are usually flushed with saline solution or a medicine called heparin. The need for flushing will depend on how the port is used. °· If the port is used for intermittent medicines or blood draws, the port will need to be flushed: °? After medicines have been given. °? After blood has been drawn. °? As part of routine maintenance. °· If a constant infusion is running, the port may not need to be flushed. ° °How long will my port stay implanted? °The port can stay in for as long as your health care provider thinks it is needed. When it is time for the port to come out, surgery will be   done to remove it. The procedure is similar to the one performed when the port was put in. When should I seek immediate medical care? When you have an implanted port, you should seek immediate medical care if:  You notice a bad smell coming from the incision site.  You have swelling, redness, or drainage at the incision site.  You have more swelling or pain at the port site or the surrounding area.  You have a fever that is not controlled with medicine.  This information is not intended to replace advice given to you by your health care provider. Make sure you discuss any questions you have with your health care provider. Document  Released: 07/21/2005 Document Revised: 12/27/2015 Document Reviewed: 03/28/2013 Elsevier Interactive Patient Education  2017 [place]. Moderate Conscious Sedation, Adult, Care After These instructions provide you with information about caring for yourself after your procedure. Your health care provider may also give you more specific instructions. Your treatment has been planned according to current medical practices, but problems sometimes occur. Call your health care provider if you have any problems or questions after your procedure. What can I expect after the procedure? After your procedure, it is common:  To feel sleepy for several hours.  To feel clumsy and have poor balance for several hours.  To have poor judgment for several hours.  To vomit if you eat too soon.  Follow these instructions at home: For at least 24 hours after the procedure:   Do not: ? Participate in activities where you could fall or become injured. ? Drive. ? Use heavy machinery. ? Drink alcohol. ? Take sleeping pills or medicines that cause drowsiness. ? Make important decisions or sign legal documents. ? Take care of children on your own.  Rest. Eating and drinking  Follow the diet recommended by your health care provider.  If you vomit: ? Drink water, juice, or soup when you can drink without vomiting. ? Make sure you have little or no nausea before eating solid foods. General instructions  Have a responsible adult stay with you until you are awake and alert.  Take over-the-counter and prescription medicines only as told by your health care provider.  If you smoke, do not smoke without supervision.  Keep all follow-up visits as told by your health care provider. This is important. Contact a health care provider if:  You keep feeling nauseous or you keep vomiting.  You feel light-headed.  You develop a rash.  You have a fever. Get help right away if:  You have trouble  breathing. This information is not intended to replace advice given to you by your health care provider. Make sure you discuss any questions you have with your health care provider. Document Released: 05/11/2013 Document Revised: 12/24/2015 Document Reviewed: 11/10/2015 Elsevier Interactive Patient Education  2018 [place], Care After This sheet gives you information about how to care for yourself after your procedure. Your health care provider may also give you more specific instructions. If you have problems or questions, contact your health care provider. What can I expect after the procedure? After your procedure, it is common to have:  Discomfort at the port insertion site.  Bruising on the skin over the port. This should improve over 3-4 days.  Follow these instructions at home: [facility] care  After your port is placed, you will get a manufacturer's information card. The card has information about your port. Keep this card with you at all times.  Take care of the port as told by your health care provider. Ask your health care provider if you or a family member can get training for taking care of the port at home. A home health care nurse may also take care of the port.  Make sure to remember what type of port you have. Incision care  Follow instructions from your health care provider about how to take care of your port insertion site. Make sure you: ? Wash your hands with soap and water before you change your bandage (dressing). If soap and water are not available, use hand sanitizer. ? Change your dressing as told by your health care provider. ? Leave stitches (sutures), skin glue, or adhesive strips in place. These skin closures may need to stay in place for 2 weeks or longer. If adhesive strip edges start to loosen and curl up, you may trim the loose edges. Do not remove adhesive strips completely unless your health care provider tells you to do  that.  Check your port insertion site every day for signs of infection. Check for: ? More redness, swelling, or pain. ? More fluid or blood. ? Warmth. ? Pus or a bad smell. General instructions  Do not take baths, swim, or use a hot tub until your health care provider approves.  Do not lift anything that is heavier than 10 lb (4.5 kg) for a week, or as told by your health care provider.  Ask your health care provider when it is okay to: ? Return to work or school. ? Resume usual physical activities or sports.  Do not drive for 24 hours if you were given a medicine to help you relax (sedative).  Take over-the-counter and prescription medicines only as told by your health care provider.  Wear a medical alert bracelet in case of an emergency. This will tell any health care providers that you have a port.  Keep all follow-up visits as told by your health care provider. This is important. Contact a health care provider if:  You cannot flush your port with saline as directed, or you cannot draw blood from the port.  You have a fever or chills.  You have more redness, swelling, or pain around your port insertion site.  You have more fluid or blood coming from your port insertion site.  Your port insertion site feels warm to the touch.  You have pus or a bad smell coming from the port insertion site. Get help right away if:  You have chest pain or shortness of breath.  You have bleeding from your port that you cannot control. Summary  Take care of the port as told by your health care provider.  Change your dressing as told by your health care provider.  Keep all follow-up visits as told by your health care provider. This information is not intended to replace advice given to you by your health care provider. Make sure you discuss any questions you have with your health care provider. Document Released: 05/11/2013 Document Revised: 06/11/2016 Document Reviewed:  06/11/2016 Elsevier Interactive Patient Education  2017 [place]. Moderate Conscious Sedation, Adult, Care After These instructions provide you with information about caring for yourself after your procedure. Your health care provider may also give you more specific instructions. Your treatment has been planned according to current medical practices, but problems sometimes occur. Call your health care provider if you have any problems or questions after your procedure. What can I expect after the procedure? After  your procedure, it is common:  To feel sleepy for several hours.  To feel clumsy and have poor balance for several hours.  To have poor judgment for several hours.  To vomit if you eat too soon.  Follow these instructions at home: For at least 24 hours after the procedure:   Do not: ? Participate in activities where you could fall or become injured. ? Drive. ? Use heavy machinery. ? Drink alcohol. ? Take sleeping pills or medicines that cause drowsiness. ? Make important decisions or sign legal documents. ? Take care of children on your own.  Rest. Eating and drinking  Follow the diet recommended by your health care provider.  If you vomit: ? Drink water, juice, or soup when you can drink without vomiting. ? Make sure you have little or no nausea before eating solid foods. General instructions  Have a responsible adult stay with you until you are awake and alert.  Take over-the-counter and prescription medicines only as told by your health care provider.  If you smoke, do not smoke without supervision.  Keep all follow-up visits as told by your health care provider. This is important. Contact a health care provider if:  You keep feeling nauseous or you keep vomiting.  You feel light-headed.  You develop a rash.  You have a fever. Get help right away if:  You have trouble breathing. This information is not intended to replace advice given to you  by your health care provider. Make sure you discuss any questions you have with your health care provider. Document Released: 05/11/2013 Document Revised: 12/24/2015 Document Reviewed: 11/10/2015 Elsevier Interactive Patient Education  [employer].

## 2017-10-05 NOTE — Procedures (Signed)
Placement of right IJ port.  Tip at SVC/RA junction.  No immediate complication.  Minimal blood loss.

## 2017-10-05 NOTE — Telephone Encounter (Signed)
LVM in reference to upcoming PET scan on 3/7, advised patient to call me back with any questions

## 2017-10-05 NOTE — Progress Notes (Signed)
Pt's Lidocaine-Prilocaine cream was approved from 08/02/17 to 01/02/18.

## 2017-10-05 NOTE — H&P (Signed)
Chief Complaint: Patient was seen in consultation today for port palcement at the request of Feng,Yan  Referring Physician(s): Feng,Yan  Supervising Physician: Markus Daft  Patient Status: [facility] - Out-pt  History of Present Illness: Mary Wise is a 67 y.o. female with left sided breast cancer. She is referred for port placement. She is starting chemotherapy next week. PMHx, meds, labs, allergies reviewed. Has been NPO this am. Friend at bedside.  Past Medical History:  Diagnosis Date  . Anxiety   . Asthma   . Cancer [facility])    left breast cancer  . Hemorrhoids   . Lipoma of left shoulder   . Migraine   . Migraine   . 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 ABDOMINAL HYSTERECTOMY    . APPENDECTOMY    . CESAREAN SECTION    . CHOLECYSTECTOMY    . ERCP    . LIVER SURGERY    . PARTIAL MASTECTOMY WITH AXILLARY SENTINEL LYMPH NODE BIOPSY Left 07/31/2017   Procedure: LEFT PARTIAL MASTECTOMY WITH AXILLARY SENTINEL LYMPH NODE BIOPSY;  Surgeon: Virl Cagey, MD;  Location: [facility] ORS;  Service: General;  Laterality: Left;    Allergies: Onion; Penicillins; Sulfa antibiotics; Yellow dyes (non-tartrazine); and Clindamycin/lincomycin  Medications: Prior to Admission medications   Medication Sig Start Date End Date Taking? Authorizing Provider  albuterol (PROVENTIL HFA;VENTOLIN HFA) 108 (90 Base) MCG/ACT inhaler Inhale 2 puffs into the lungs every 6 (six) hours as needed for wheezing or shortness of breath.   Yes [provider]  ALPRAZolam Duanne Moron) 1 MG tablet Take 1 tablet (1 mg total) by mouth daily as needed for anxiety. 09/30/17  Yes Truitt Merle, MD  Bioflavonoid Products [facility]) TABS Take 1 tablet by mouth daily.   Yes [provider]  Hypromellose (ARTIFICIAL TEARS OP) Apply to eye 2 (two) times daily. Doesn't do drops in eyes does as a wash to flush eyes out   Yes [provider]  ibuprofen (ADVIL,MOTRIN) 800 MG tablet Take 800 mg by mouth every 8 (eight) hours as needed.   Yes [provider]  lidocaine (LIDODERM) 5 % Place 0.5-1 patches onto the skin daily as needed (pain). Remove & Discard patch within 12 hours or as directed by MD   Yes [provider]  lidocaine-prilocaine (EMLA) cream Apply 1 application topically as needed. 10/02/17  Yes Truitt Merle, MD  Menthol, Topical Analgesic, (BIOFREEZE EX) Apply 1 application topically as needed (pain).   Yes [provider]  ondansetron (ZOFRAN) 8 MG tablet Take 1 tablet (8 mg total) by mouth 2 (two) times daily as needed. Start on the third day after chemotherapy. 09/30/17  Yes Truitt Merle, MD  oxyCODONE (OXY IR/ROXICODONE) 5 MG immediate release tablet Take 5 mg by mouth every 4 (four) hours as needed for severe pain.   Yes [provider]  prochlorperazine (COMPAZINE) 10 MG tablet Take 1 tablet (10 mg total) by mouth every 6 (six) hours as needed (Nausea or vomiting). 09/30/17 09/30/17  Truitt Merle, MD     Family History  Problem Relation Age of Onset  . Cancer Mother        ?bone cancer   . Cancer Father        brain cancer   . Cancer Sister 37       ovarian cancer   . Diabetes Brother   . Cancer Maternal Grandmother  breast cancer and throat cancer   . Cancer Other   . Diabetes Sister   . Diabetes Sister   . Diabetes Sister   . Cancer Paternal Uncle        brain cancer   . Cancer Paternal Grandmother        GYN cancer   . Cancer Paternal Grandfather        lung cancer  . Cancer Paternal Uncle        brain tumor   . Cancer Paternal Uncle        brain tumor     Social History   Socioeconomic History  . Marital status: Single    Spouse name: None  . Number of children: None  . Years of education: None  . Highest education level: None  Social Needs  . Financial resource strain: None  . Food insecurity - worry: None  . Food insecurity - inability: None  .  Transportation needs - medical: None  . Transportation needs - non-medical: None  Occupational History  . None  Tobacco Use  . Smoking status: Former Smoker    Packs/day: 0.25    Years: 9.00    Pack years: 2.25    Last attempt to quit: 1982    Years since quitting: 37.1  . Smokeless tobacco: Never Used  Substance and Sexual Activity  . Alcohol use: No    Comment: social   . Drug use: No  . Sexual activity: No  Other Topics Concern  . None  Social History Narrative   Lives in Assisted Living on [address].      Review of Systems: A 12 point ROS discussed and pertinent positives are indicated in the HPI above.  All other systems are negative.  Review of Systems  Vital Signs: BP (!) 146/96   Pulse 81   Temp 97.7 F (36.5 C) (Oral)   Ht 5\' 8"  (1.727 m)   Wt 190 lb (86.2 kg)   SpO2 98%   BMI 28.89 kg/m   Physical Exam  Constitutional: She is oriented to person, place, and time. She appears well-developed. No distress.  HENT:  Head: Normocephalic.  Mouth/Throat: Oropharynx is clear and moist.  Neck: Normal range of motion. No JVD present. No tracheal deviation present.  Cardiovascular: Normal rate, regular rhythm and normal heart sounds.  Pulmonary/Chest: Effort normal and breath sounds normal. No respiratory distress.  Abdominal: Soft.  Neurological: She is alert and oriented to person, place, and time.  Psychiatric: She has a normal mood and affect. Judgment normal.      Imaging: No results found.  Labs:  CBC: Recent Labs    07/01/17 1449 07/23/17 1424 08/19/17 1357 10/05/17 0953  WBC 7.1 7.4 8.5 6.0  HGB 13.7 12.9 13.0 13.1  HCT 41.4 39.7 39.8 40.2  PLT 375 325 368 337    COAGS: Recent Labs    10/05/17 0953  INR 0.98  APTT 26    BMP: Recent Labs    06/29/17 0934 07/01/17 1449 07/23/17 1424 08/19/17 1357  NA  --  139 138 139  K  --  3.9 3.5 3.6  CL  --  100 103 103  CO2  --  29 25 25   GLUCOSE  --  85 90 92  BUN  --  10 12 16     CALCIUM  --  10.6* 9.3 9.2  CREATININE 0.70 0.71 0.71 0.86  GFRNONAA  --   --  >60 >60  GFRAA  --   --  >  60 >60    LIVER FUNCTION TESTS: Recent Labs    07/01/17 1449 08/19/17 1357  BILITOT 0.3 0.7  AST 27 26  ALT 28 22  ALKPHOS  --  106  PROT 7.6 7.9  ALBUMIN  --  4.1    TUMOR MARKERS: No results for input(s): AFPTM, CEA, CA199, CHROMGRNA in the last 8760 hours.  Assessment and Plan: Breast cancer For port Labs ok Risks and benefits of image guided port-a-catheter placement was discussed with the patient including, but not limited to bleeding, infection, pneumothorax, or fibrin sheath development and need for additional procedures.  All of the patient's questions were answered, patient is agreeable to proceed. Consent signed and in chart.    Thank you for this interesting consult.  I greatly enjoyed meeting BRAYAH URQUILLA and look forward to participating in their care.  A copy of this report was sent to the requesting provider on this date.  Electronically Signed: Ascencion Dike, PA-C 10/05/2017, 11:29 AM   I spent a total of 20 minutes in face to face in clinical consultation, greater than 50% of which was counseling/coordinating care for port placement

## 2017-10-06 ENCOUNTER — Other Ambulatory Visit: Payer: Self-pay | Admitting: Hematology

## 2017-10-06 ENCOUNTER — Telehealth: Payer: Self-pay | Admitting: *Deleted

## 2017-10-06 MED ORDER — HYDROCODONE-ACETAMINOPHEN 5-325 MG PO TABS: 1.0000 | ORAL_TABLET | Freq: Four times a day (QID) | ORAL | 0 refills | Status: DC | PRN

## 2017-10-06 NOTE — Telephone Encounter (Signed)
Spoke with pt and informed pt that Dr. Burr Medico had sent in script for Hydrocodone today.  Instructed pt to monitor for constipation, and encourage pt to increase po fluids intake as tolerated.  Pt voiced understanding.

## 2017-10-06 NOTE — Telephone Encounter (Signed)
-----   Message from Truitt Merle, MD sent at 10/06/2017  3:36 PM EST ----- Regarding: RE: pain Mary Wise, thanks for the message.  Sherica Paternostro, let pt know I will call in hydrocordone for her, thanks  Krista Blue  ----- Message ----- From: Mauro Kaufmann, RN Sent: 10/06/2017  10:37 AM To: Jarvis Morgan, RN, Truitt Merle, MD Subject: pain                                           Hello Dr. Burr Medico,  [name] had her port placed yesterday by IR. When we called to talk to her about her scans, she reports she is in a great deal of pain. She would like to know if you would be willing to write her a prescription for pain medication.  Mary Wise

## 2017-10-07 ENCOUNTER — Encounter: Payer: Self-pay | Admitting: Hematology

## 2017-10-07 NOTE — Progress Notes (Signed)
Pt's ins came back and approved her Lidocaine-Prilocaine cream from 08/04/17 to 08/03/18.

## 2017-10-08 ENCOUNTER — Ambulatory Visit (HOSPITAL_COMMUNITY)
Admission: RE | Admit: 2017-10-08 | Discharge: 2017-10-08 | Disposition: A | Discharge status: Home / Self Care | Payer: Medicare Other | Source: Ambulatory Visit | Attending: Hematology | Admitting: Hematology

## 2017-10-08 ENCOUNTER — Encounter (HOSPITAL_COMMUNITY)
Admission: RE | Admit: 2017-10-08 | Discharge: 2017-10-08 | Disposition: A | Discharge status: Home / Self Care | Payer: Medicare Other | Source: Ambulatory Visit | Attending: Hematology | Admitting: Hematology

## 2017-10-08 DIAGNOSIS — Z171 Estrogen receptor negative status [ER-]: Secondary | ICD-10-CM | POA: Insufficient documentation

## 2017-10-08 DIAGNOSIS — Z79899 Other long term (current) drug therapy: Secondary | ICD-10-CM | POA: Insufficient documentation

## 2017-10-08 DIAGNOSIS — Z5111 Encounter for antineoplastic chemotherapy: Secondary | ICD-10-CM | POA: Diagnosis not present

## 2017-10-08 DIAGNOSIS — C50412 Malignant neoplasm of upper-outer quadrant of left female breast: Secondary | ICD-10-CM | POA: Insufficient documentation

## 2017-10-08 DIAGNOSIS — C50919 Malignant neoplasm of unspecified site of unspecified female breast: Secondary | ICD-10-CM | POA: Diagnosis not present

## 2017-10-08 LAB — GLUCOSE, CAPILLARY: GLUCOSE-CAPILLARY: 89 mg/dL (ref 65–99)

## 2017-10-08 MED ORDER — FLUDEOXYGLUCOSE F - 18 (FDG) INJECTION
9.3800 | Freq: Once | INTRAVENOUS | Status: AC | PRN
  Administered 2017-10-08: 9.38 via INTRAVENOUS

## 2017-10-08 NOTE — Progress Notes (Signed)
  Echocardiogram 2D Echocardiogram has been performed.  Merrie Roof F 10/08/2017, 12:57 PM

## 2017-10-09 ENCOUNTER — Inpatient Hospital Stay: Payer: Medicare Other | Attending: Hematology

## 2017-10-09 ENCOUNTER — Other Ambulatory Visit: Payer: Self-pay | Admitting: *Deleted

## 2017-10-09 DIAGNOSIS — C50412 Malignant neoplasm of upper-outer quadrant of left female breast: Secondary | ICD-10-CM

## 2017-10-09 DIAGNOSIS — Z171 Estrogen receptor negative status [ER-]: Principal | ICD-10-CM

## 2017-10-09 DIAGNOSIS — Z5111 Encounter for antineoplastic chemotherapy: Secondary | ICD-10-CM | POA: Insufficient documentation

## 2017-10-10 NOTE — Progress Notes (Signed)
[place]  [phone] [phone]  Clinic Follow up Note   Patient Care Team: [name], MD as PCP - General (Family Medicine) 10/12/2017   CHIEF COMPLAINT: F/u triple negative breast cancer   SUMMARY OF ONCOLOGIC HISTORY: Oncology History   Cancer Staging Malignant neoplasm of upper-outer quadrant of left breast in female, estrogen receptor negative ([place]) Staging form: Breast, AJCC 8th Edition - Pathologic stage from 08/19/2017: Stage IIA (pT2, pN0(sn), cM0, G3, ER: Negative, PR: Negative, HER2: Negative) - Signed by Ardath Sax, MD on 09/01/2017       Malignant neoplasm of upper-outer quadrant of left breast in female, estrogen receptor negative ([place])   06/16/2017 Initial Diagnosis    Malignant neoplasm of upper-outer quadrant of left breast in female: Initially presented with left deltoid mass which was consistent with lipoma.  During examination, a mass in the left breast was appreciated by palpation.      06/16/2017 Mammogram    BL Diagnostic Study: Irregular hyperdense mass in the upper outer quadrant of the left breast measuring 3.6 cm without radiographically detectable lymphadenopathy.  Normal appearance of the right breast.      06/16/2017 Breast US    3.2 cm irregular mass located at 2:30 o'clock, no pathological lymphadenopathy.      06/23/2017 Pathology Results    Ultrasound-guided biopsy: Positive for invasive ductal carcinoma, grade 3, ER 0%, PR 0%, HER-2 negative by [facility]      07/31/2017 Surgery    Partial left mastectomy with sentinel lymph node biopsy --Pathology: Invasive ductal carcinoma, measuring 4.5 cm, grade 3.  All margins negative, but the anterior margin less than 0.1 cm.  No surrounding DCIS.  0/5 lymph nodes positive for malignancy      10/08/2017 PET scan    IMPRESSION: 1. No evidence breast cancer metastasis on FDG PET-CT scan. 2. Postsurgical change in the LEFT lateral breast and LEFT axilla.        HISTORY OF PRESENT ILLNESS: Mary Wise 67 y.o. female presents to the [facility] for follow up her left breast cancer that has recently been surgically resected. The patient was referred by Dr. Isidore Moos.  She was previously seen by my partner Dr. Lebron Conners at [facility].  Patient states she noticed a lump in her left breast after she had knocked into her bed and felt pain in the area. 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 She saw her PCP and during the evaluation, a mass was discovered in the lateral aspect of the left breast leading to additional evaluation. She had been getting regular mammograms yearly before this with one abnormal result in the past that revealed fatty tissue.  Her diagnostic mammogram from 06/16/17 revealed a suspicious mass in the 230 o'clock location of the left breast with her initial biopsy confirming invasive ductal carcinoma. Prognostic indicators significant for: ER, 0% negative and PR, 0% negative. Proliferation marker Ki67 at 90%. HER2 negative. Pt underwent a left partial mastectomy with sentinel node biopsy with Dr. Constance Haw on 07/31/17. Surgical pathology results revealed invasive ductal carcinoma and DCIS, grade III, spanning 4.5 cm. Lymph nodes biopsied were negative.  She met Dr. Lebron Conners one month ago, adjuvant chemotherapy was recommended, but she declined.  She was seen by radiation oncologist Dr. Isidore Moos yesterday, who encouraged her to consider adjuvant chemotherapy, and referred her to me.  In the past she was diagnosed with  medical conditions of anxiety, asthma, seizure and migraines. She has had gallstones that traveled to liver that required surgery, appendectomy, and complete hysterectomy.   She has a FHx of breast cancer, brain cancer, ovarian cancer, throat cancer, and lung cancer. She has a Hx of tobacco use for 9 years over 30 years ago. She  GYN HISTORY  Menarchal:  12 LMP: 61, complete hysterectomy  Contraceptive: HRT:  GP: 1 daughter, Mary Wise, she works in the ED at [employer] she is divorced and lives by herself in an assisted living facility in [place]. She states she is able to do everything for herself. She enjoys the company of people and she is able to get meals there.   CURRENT THERAPY: Adjuvant chemotherapy AC every 2 weeks for 4 cycles, followed by  bi-weekly Taxol for 4 cycles (or weekly for 12 weeks), starting on 10/12/17  INTERVAL HISTORY: Mary Wise is here for follow up and her 1st treatment of AC. She presents to the infusion room today accompanied by her daughter. She reports she is ready to start chemo today and she felt well informed during chemo class.   On review of systems, pt denies new pain, or any other complaints at this time. Pertinent positives are listed and detailed within the above HPI.   REVIEW OF SYSTEMS:   Constitutional: Denies fevers, chills or abnormal weight loss Eyes: Denies blurriness of vision Ears, nose, mouth, throat, and face: Denies mucositis or sore throat Respiratory: Denies cough, dyspnea or wheezes Cardiovascular: Denies palpitation, chest discomfort or lower extremity swelling Gastrointestinal:  Denies nausea, heartburn or change in bowel habits Skin: Denies abnormal skin rashes Lymphatics: Denies new lymphadenopathy or easy bruising Neurological:Denies numbness, tingling or new weaknesses Behavioral/Psych:  (+) anxiety Extremity: (+) lipoma of the upper left arm  All other systems were reviewed with the patient and are negative.  MEDICAL HISTORY:  Past Medical History:  Diagnosis Date  . Anxiety   . Asthma   . Cancer [facility])    left breast cancer  . Hemorrhoids   . Lipoma of left shoulder   . Migraine   . Migraine   . 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 ABDOMINAL HYSTERECTOMY     . APPENDECTOMY    . CESAREAN SECTION    . CHOLECYSTECTOMY    . ERCP    . IR FLUORO GUIDE PORT INSERTION RIGHT  10/05/2017  . IR US GUIDE VASC ACCESS RIGHT  10/05/2017  . LIVER SURGERY    . PARTIAL MASTECTOMY WITH AXILLARY SENTINEL LYMPH NODE BIOPSY Left 07/31/2017   Procedure: LEFT PARTIAL MASTECTOMY WITH AXILLARY SENTINEL LYMPH NODE BIOPSY;  Surgeon: Virl Cagey, MD;  Location: [facility];  Service: General;  Laterality: Left;    I have reviewed the social history and family history with the patient and they are unchanged from previous note.  ALLERGIES:  is allergic to onion; penicillins; sulfa antibiotics; yellow dyes (non-tartrazine); and clindamycin/lincomycin.  MEDICATIONS:  Current Outpatient Medications  Medication Sig Dispense Refill  . albuterol (PROVENTIL HFA;VENTOLIN HFA) 108 (90 Base) MCG/ACT inhaler Inhale 2 puffs into the lungs every 6 (six) hours as needed for wheezing or shortness of breath.    . ALPRAZolam (XANAX) 1 MG tablet Take 1 tablet (1 mg total) by mouth daily as needed for anxiety. 30 tablet 0  . Bioflavonoid Products (BIOFLEX) TABS Take 1 tablet by mouth  daily.    Marland Kitchen HYDROcodone-acetaminophen (NORCO) 5-325 MG tablet Take 1 tablet by mouth every 6 (six) hours as needed for moderate pain. 15 tablet 0  . Hypromellose (ARTIFICIAL TEARS OP) Apply to eye 2 (two) times daily. Doesn't do drops in eyes does as a wash to flush eyes out    . ibuprofen (ADVIL,MOTRIN) 800 MG tablet Take 800 mg by mouth every 8 (eight) hours as needed.    . lidocaine (LIDODERM) 5 % Place 0.5-1 patches onto the skin daily as needed (pain). Remove & Discard patch within 12 hours or as directed by MD    . lidocaine-prilocaine (EMLA) cream Apply 1 application topically as needed. 30 g 2  . Menthol, Topical Analgesic, (BIOFREEZE EX) Apply 1 application topically as needed (pain).    . ondansetron (ZOFRAN) 8 MG tablet Take 1 tablet (8 mg total) by mouth 2 (two) times daily as needed. Start on the  third day after chemotherapy. 30 tablet 1  . oxyCODONE (OXY IR/ROXICODONE) 5 MG immediate release tablet Take 5 mg by mouth every 4 (four) hours as needed for severe pain.    Marland Kitchen prochlorperazine (COMPAZINE) 10 MG tablet Take 10 mg by mouth every 6 (six) hours as needed for nausea or vomiting.     No current facility-administered medications for this visit.     PHYSICAL EXAMINATION: ECOG PERFORMANCE STATUS: 0 - Asymptomatic Blood pressure 138/98, heart rate 92, respiratory rate 16, temperature 36.9, pulse ox 96% on room air GENERAL:alert, no distress and comfortable SKIN: skin color, texture, turgor are normal, no rashes or significant lesions EYES: normal, Conjunctiva are pink and non-injected, sclera clear OROPHARYNX:no exudate, no erythema and lips, buccal mucosa, and tongue normal  NECK: supple, thyroid normal size, non-tender, without nodularity LYMPH:  no palpable lymphadenopathy in the cervical, axillary or inguinal LUNGS: clear to auscultation and percussion with normal breathing effort HEART: regular rate & rhythm and no murmurs and no lower extremity edema ABDOMEN:abdomen soft, non-tender and normal bowel sounds. No hepatomegaly  Musculoskeletal:no cyanosis of digits and no clubbing  NEURO: alert & oriented x 3 with fluent speech, no focal motor/sensory deficits EXTREMITY: left upper extremity with 14 cm by 11 cm subcutaneous mass   LABORATORY DATA:  I have reviewed the data as listed CBC Latest Ref Rng & Units 10/12/2017 10/05/2017 08/19/2017  WBC 3.9 - 10.3 K/uL 6.2 6.0 8.5  Hemoglobin 12.0 - 15.0 g/dL - 13.1 13.0  Hematocrit 34.8 - 46.6 % 40.0 40.2 39.8  Platelets 145 - 400 K/uL 295 337 368     CMP Latest Ref Rng & Units 10/12/2017 08/19/2017 07/23/2017  Glucose 70 - 140 mg/dL 90 92 90  BUN 7 - 26 mg/dL '14 16 12  ' Creatinine 0.60 - 1.10 mg/dL 0.77 0.86 0.71  Sodium 136 - 145 mmol/L 141 139 138  Potassium 3.5 - 5.1 mmol/L 3.6 3.6 3.5  Chloride 98 - 109 mmol/L 105 103 103   CO2 22 - 29 mmol/L '26 25 25  ' Calcium 8.4 - 10.4 mg/dL 9.7 9.2 9.3  Total Protein 6.4 - 8.3 g/dL 7.6 7.9 -  Total Bilirubin 0.2 - 1.2 mg/dL 0.3 0.7 -  Alkaline Phos 40 - 150 U/L 121 106 -  AST 5 - 34 U/L 21 26 -  ALT 0 - 55 U/L 28 22 -   PROCEDURE  Echo 10/08/17 Study Conclusions - Left ventricle: The cavity size was normal. There was moderate   concentric hypertrophy. Systolic function was normal. The   estimated  ejection fraction was in the range of 55% to 60%. Wall   motion was normal; there were no regional wall motion   abnormalities. Doppler parameters are consistent with abnormal   left ventricular relaxation (grade 1 diastolic dysfunction).   There was no evidence of elevated ventricular filling pressure by   Doppler parameters. - Aortic valve: Trileaflet; normal thickness leaflets. There was no   regurgitation. - Aortic root: The aortic root was normal in size. - Mitral valve: There was no regurgitation. - Right ventricle: Systolic function was normal. - Right atrium: The atrium was normal in size. - Tricuspid valve: There was no regurgitation. - Pulmonic valve: There was no regurgitation. - Pulmonary arteries: Systolic pressure could not be accurately   estimated. - Inferior vena cava: The vessel was normal in size. - Pericardium, extracardiac: There was no pericardial effusion.  PATHOLOGY  Initial biopsy  Diagnosis 07/02/17 Breast, left, needle core biopsy, 2:00 - INVASIVE DUCTAL CARCINOMA. - SEE COMMENT. Microscopic Comment The carcinoma appears grade III. A breast prognostic profile will be performed and the results reported separately. The results were called to [facility] on 06/24/2017. (JBK:ecj 06/24/2017) Results: HER2 - NEGATIVE RATIO OF HER2/CEP17 SIGNALS 1.60 AVERAGE HER2 COPY NUMBER PER CELL 3.60 Results: IMMUNOHISTOCHEMICAL AND MORPHOMETRIC ANALYSIS PERFORMED MANUALLY Estrogen Receptor: 0%, NEGATIVE Progesterone Receptor: 0%,  NEGATIVE Proliferation Marker Ki67: 90%  Surgical pathology  Diagnosis 07/31/18 1. Lymph node, sentinel, biopsy, left - ONE OF ONE LYMPH NODES NEGATIVE FOR CARCINOMA (0/1). 2. 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 - INVASIVE CARCINOMA IS <0.1 CM OF THE ANTERIOR MARGIN BROADLY. - SEE ONCOLOGY TABLE. 3. Breast, excision, left deep - BENIGN BREAST TISSUE. 4. Breast, excision, left superior - BENIGN BREAST TISSUE. 5. Breast, excision, left medial - BENIGN BREAST TISSUE. 6. Breast, excision, left inferior - BENIGN BREAST TISSUE. 7. Breast, excision, left lateral - BENIGN BREAST TISSUE. 8. Lymph node, sentinel, biopsy - ONE OF ONE LYMPH NODES NEGATIVE FOR CARCINOMA (0/1). 9. Lymph node, sentinel, biopsy - ONE OF ONE LYMPH NODES NEGATIVE FOR CARCINOMA (0/1). 10. Lymph node, sentinel, biopsy - ONE OF ONE LYMPH NODES NEGATIVE FOR CARCINOMA (0/1). 11. Lymph node, sentinel, biopsy - ONE OF ONE LYMPH NODES NEGATIVE FOR CARCINOMA (0/1). Microscopic Comment 2. BREAST, INVASIVE TUMOR Procedure: Left partial mastectomy with additional margins and left sentinel lymph node biopsies. Laterality: Left. 1 of 4 FINAL for ADIAH, GUERECA ([id number]) Microscopic Comment(continued) Tumor Size: 4.5 cm. Histologic Type: Invasive ductal carcinoma. Grade: 3 Tubular Differentiation: 2 Nuclear Pleomorphism: 3 Mitotic Count: 3 Ductal Carcinoma in Situ (DCIS): Not identified. Extent of Tumor: Confined to breast parenchyma. Margins: Invasive carcinoma, distance from closest margin: <0.1 cm of anterior margin broadly. Remaining final margins (parts 3-7) are >1 cm. DCIS, distance from closest margin: N/A. Regional Lymph Nodes: Number of Lymph Nodes Examined: 5 Number of Sentinel Lymph Nodes Examined: 5 Lymph Nodes with Macrometastases: 0 Lymph Nodes with Micrometastases: 0 Lymph Nodes with Isolated Tumor Cells: 0 Breast Prognostic Profile: Performed  on biopsy [id number], see below. Will not be repeated. Estrogen Receptor: Negative. Progesterone Receptor: Negative. Her2: Negative (ratio 1.60). Ki-67: 90%. Best tumor block for sendout testing: 2D Pathologic Stage Classification (pTNM, AJCC 8th Edition): Primary Tumor (pT): pT2 Regional Lymph Nodes (pN): pN0 Distant Metastases (pM): pMX Comments: None.  RADIOGRAPHIC STUDIES: I have personally reviewed the radiological images as listed and agreed with the findings in the report. No results found.   PET Scan  10/08/17 IMPRESSION: 1. No evidence breast cancer metastasis on FDG PET-CT scan. 2. Postsurgical change in the LEFT lateral breast and LEFT axilla.  Diagnostic Mammogram and [place] 06/16/17 IMPRESSION: Suspicious mass in the 230 o'clock location of the left breast. Tissue diagnosis is recommended. No evidence for adenopathy.  ASSESSMENT & PLAN:  No problem-specific Assessment & Plan notes found for this encounter. Mary Wise 67 y.o. 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  1. 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  Proliferation marker Ki67 at 90% -I previously discussed her breast imaging and surgical pathology results with patient and her daughter in great detail. Her diagnostic mammogram from 06/16/17 revealed a suspicious mass in the 230 o'clock location of the left breast with her initial biopsy confirming invasive ductal carcinoma.  - Pt underwent a left partial mastectomy with sentinel node biopsy with Dr. Constance Haw on 07/31/17. Surgical pathology results revealed invasive ductal carcinoma, grade III, spanning 4.5 cm. 5 lymph nodes biopsied were negative.  -Given her strong family history of cancer, we recommend her to undergo genetic testing to ruled out inheritable breast cancer. She agrees.  - She is at high risk for recurrence due to  her Stage II triple negative disease which is more aggressive than ER PR positive disease. Her estimated risk for distant recurrence is about 30-35% -I strongly recommended her to consider adjuvant chemotherapy to reduce her risk of recurrence. I recommend 2 months of adriamycin and cytoxan followed by bi-weekly taxol for 2 months. I also discussed and offer the option of docetaxel and Cytoxan (TC) every 3 weeks for 4 cycles. I previously discussed side effects of these specific regimens. She is agreeable with the 1st option AC-T. -the goal of therapy is curative -PET from 10/08/17 showed no evidence of metastasis and her ECHO revealed normal heart function, discussed the results with patient. Will order another ECHO once she completes chemo. I discussed results with pt.  -Labs today are adequate to begin chemo treatment today. I discussed the side effects she will likely experience. She will go home with Onpro patch today and after every treatment.  She will take Claritin for Neulasta related bone pain. -F/u in 2 weeks, she knows to call with any questions or concerns.   2. Anxiety -She takes Xanax PRN but has found herself using it more often since her Breast Cancer diagnosis. -I refilled for her today   3. Large left upper arm lipoma  -She has a large lipoma in the upper arm/should area, she was seen by Dr. Sherlynn Stalls at [facility].  -Dr. Sherlynn Stalls plan to have surgical resection after she completes chemo, and he will coordinate with her radiation with Dr. Isidore Moos.  4. Genetics -She has strong family history of breast, ovarian cancer and brain tumor, I strongly recommend her to have genetic testing to rule out inheritable genetic syndrome.  She agrees, I will refer her today.  Plan Start cycle 1 adriamycin and cytoxan today, Onpro patch on discharge  Lab and f/u in 2 weeks for cycle 2 chemo    No orders of the defined types were placed in this encounter.  All questions were answered. The patient knows to  call the clinic with any problems, questions or concerns. No barriers to learning was detected.  I spent 15 minutes counseling the patient face to face. The total time spent in the appointment was  20 minutes and more than 50% was on counseling and review of test results  This document serves as a record of services personally performed by Truitt Merle, MD. It was created on her behalf by Theresia Bough, a trained medical scribe. The creation of this record is based on the scribe's personal observations and the provider's statements to them.   I have reviewed the above documentation for accuracy and completeness, and I agree with the above.     Truitt Merle, MD 10/12/17

## 2017-10-12 ENCOUNTER — Other Ambulatory Visit: Payer: Medicare Other

## 2017-10-12 ENCOUNTER — Encounter: Payer: Self-pay | Admitting: Hematology

## 2017-10-12 ENCOUNTER — Inpatient Hospital Stay: Payer: Medicare Other

## 2017-10-12 ENCOUNTER — Ambulatory Visit: Payer: Medicare Other | Admitting: Nurse Practitioner

## 2017-10-12 ENCOUNTER — Encounter: Payer: Self-pay | Admitting: *Deleted

## 2017-10-12 ENCOUNTER — Inpatient Hospital Stay (HOSPITAL_BASED_OUTPATIENT_CLINIC_OR_DEPARTMENT_OTHER): Payer: Medicare Other | Admitting: Hematology

## 2017-10-12 VITALS — BP 138/98 | HR 92 | Temp 98.4°F | Resp 16

## 2017-10-12 DIAGNOSIS — Z95828 Presence of other vascular implants and grafts: Secondary | ICD-10-CM | POA: Insufficient documentation

## 2017-10-12 DIAGNOSIS — Z171 Estrogen receptor negative status [ER-]: Principal | ICD-10-CM

## 2017-10-12 DIAGNOSIS — C50412 Malignant neoplasm of upper-outer quadrant of left female breast: Secondary | ICD-10-CM

## 2017-10-12 DIAGNOSIS — F419 Anxiety disorder, unspecified: Secondary | ICD-10-CM

## 2017-10-12 DIAGNOSIS — Z5111 Encounter for antineoplastic chemotherapy: Secondary | ICD-10-CM | POA: Diagnosis not present

## 2017-10-12 DIAGNOSIS — D1722 Benign lipomatous neoplasm of skin and subcutaneous tissue of left arm: Secondary | ICD-10-CM | POA: Diagnosis not present

## 2017-10-12 LAB — CMP (CANCER CENTER ONLY)
ALK PHOS: 121 U/L (ref 40–150)
ALT: 28 U/L (ref 0–55)
AST: 21 U/L (ref 5–34)
Albumin: 3.8 g/dL (ref 3.5–5.0)
Anion gap: 10 (ref 3–11)
BUN: 14 mg/dL (ref 7–26)
CALCIUM: 9.7 mg/dL (ref 8.4–10.4)
CO2: 26 mmol/L (ref 22–29)
Chloride: 105 mmol/L (ref 98–109)
Creatinine: 0.77 mg/dL (ref 0.60–1.10)
GFR, Est AFR Am: >60 mL/min (ref >60)
GFR, Estimated: >60 mL/min (ref >60)
Glucose, Bld: 90 mg/dL (ref 70–140)
Potassium: 3.6 mmol/L (ref 3.5–5.1)
SODIUM: 141 mmol/L (ref 136–145)
Total Bilirubin: 0.3 mg/dL (ref 0.2–1.2)
Total Protein: 7.6 g/dL (ref 6.4–8.3)

## 2017-10-12 LAB — CBC WITH DIFFERENTIAL (CANCER CENTER ONLY)
BASOS PCT: 1 %
Basophils Absolute: 0 10*3/uL (ref 0.0–0.1)
Eosinophils Absolute: 0.3 10*3/uL (ref 0.0–0.5)
Eosinophils Relative: 5 %
HEMATOCRIT: 40 % (ref 34.8–46.6)
Hemoglobin: 13.2 g/dL (ref 11.6–15.9)
LYMPHS ABS: 2.5 10*3/uL (ref 0.9–3.3)
Lymphocytes Relative: 40 %
MCH: 29.4 pg (ref 25.1–34.0)
MCHC: 33 g/dL (ref 31.5–36.0)
MCV: 89.1 fL (ref 79.5–101.0)
MONO ABS: 0.5 10*3/uL (ref 0.1–0.9)
MONOS PCT: 9 %
NEUTROS ABS: 2.9 10*3/uL (ref 1.5–6.5)
Neutrophils Relative %: 45 %
Platelet Count: 295 10*3/uL (ref 145–400)
RBC: 4.49 MIL/uL (ref 3.70–5.45)
RDW: 13.1 % (ref 11.2–14.5)
WBC Count: 6.2 10*3/uL (ref 3.9–10.3)

## 2017-10-12 MED ORDER — DOXORUBICIN HCL CHEMO IV INJECTION 2 MG/ML
60.0000 mg/m2 | Freq: Once | INTRAVENOUS | Status: AC
  Administered 2017-10-12: 124 mg via INTRAVENOUS
  Filled 2017-10-12: qty 62

## 2017-10-12 MED ORDER — SODIUM CHLORIDE 0.9 % IV SOLN
Freq: Once | INTRAVENOUS | Status: AC
  Administered 2017-10-12: 14:00:00 via INTRAVENOUS

## 2017-10-12 MED ORDER — SODIUM CHLORIDE 0.9% FLUSH
10.0000 mL | Freq: Once | INTRAVENOUS | Status: AC
  Administered 2017-10-12: 10 mL
  Filled 2017-10-12: qty 10

## 2017-10-12 MED ORDER — PEGFILGRASTIM 6 MG/0.6ML ~~LOC~~ PSKT
PREFILLED_SYRINGE | SUBCUTANEOUS | Status: AC
  Filled 2017-10-12: qty 0.6

## 2017-10-12 MED ORDER — SODIUM CHLORIDE 0.9% FLUSH
10.0000 mL | INTRAVENOUS | Status: DC | PRN
  Administered 2017-10-12: 10 mL
  Filled 2017-10-12: qty 10

## 2017-10-12 MED ORDER — SODIUM CHLORIDE 0.9 % IV SOLN
Freq: Once | INTRAVENOUS | Status: AC
  Administered 2017-10-12: 14:00:00 via INTRAVENOUS
  Filled 2017-10-12: qty 5

## 2017-10-12 MED ORDER — PEGFILGRASTIM 6 MG/0.6ML ~~LOC~~ PSKT
6.0000 mg | PREFILLED_SYRINGE | Freq: Once | SUBCUTANEOUS | Status: AC
  Administered 2017-10-12: 6 mg via SUBCUTANEOUS

## 2017-10-12 MED ORDER — PALONOSETRON HCL INJECTION 0.25 MG/5ML
0.2500 mg | Freq: Once | INTRAVENOUS | Status: AC
  Administered 2017-10-12: 0.25 mg via INTRAVENOUS

## 2017-10-12 MED ORDER — CYCLOPHOSPHAMIDE CHEMO INJECTION 1 GM
600.0000 mg/m2 | Freq: Once | INTRAMUSCULAR | Status: AC
  Administered 2017-10-12: 1240 mg via INTRAVENOUS
  Filled 2017-10-12: qty 62

## 2017-10-12 MED ORDER — PALONOSETRON HCL INJECTION 0.25 MG/5ML
INTRAVENOUS | Status: AC
  Filled 2017-10-12: qty 5

## 2017-10-12 MED ORDER — HEPARIN SOD (PORK) LOCK FLUSH 100 UNIT/ML IV SOLN
500.0000 [IU] | Freq: Once | INTRAVENOUS | Status: AC | PRN
  Administered 2017-10-12: 500 [IU]
  Filled 2017-10-12: qty 5

## 2017-10-12 NOTE — Progress Notes (Signed)
Per Dr. Burr Medico, ok to treat with current vital signs.

## 2017-10-12 NOTE — Patient Instructions (Addendum)
[facility]charge Instructions for Patients Receiving Chemotherapy  Today you received the following chemotherapy agents:  Adriamycin and Cytoxan.  To help prevent nausea and vomiting after your treatment, we encourage you to take your nausea medication as directed.   If you develop nausea and vomiting that is not controlled by your nausea medication, call the clinic.   BELOW ARE SYMPTOMS THAT SHOULD BE REPORTED IMMEDIATELY:  *FEVER GREATER THAN 100.5 F  *CHILLS WITH OR WITHOUT FEVER  NAUSEA AND VOMITING THAT IS NOT CONTROLLED WITH YOUR NAUSEA MEDICATION  *UNUSUAL SHORTNESS OF BREATH  *UNUSUAL BRUISING OR BLEEDING  TENDERNESS IN MOUTH AND THROAT WITH OR WITHOUT PRESENCE OF ULCERS  *URINARY PROBLEMS  *BOWEL PROBLEMS  UNUSUAL RASH Items with * indicate a potential emergency and should be followed up as soon as possible.  Feel free to call the clinic should you have any questions or concerns. The clinic phone number is [phone].  Please show the [facility] at check-in to the Emergency Department and triage nurse.  Doxorubicin injection What is this medicine? DOXORUBICIN (dox oh ROO bi sin) is a chemotherapy drug. It is used to treat many kinds of cancer like leukemia, lymphoma, neuroblastoma, sarcoma, and Wilms' tumor. It is also used to treat bladder cancer, breast cancer, lung cancer, ovarian cancer, stomach cancer, and thyroid cancer. This medicine may be used for other purposes; ask your health care provider or pharmacist if you have questions. COMMON BRAND NAME(S): Adriamycin, Adriamycin PFS, Adriamycin RDF, Rubex What should I tell my health care provider before I take this medicine? They need to know if you have any of these conditions: -heart disease -history of low blood counts caused by a medicine -liver disease -recent or ongoing radiation therapy -an unusual or allergic reaction to doxorubicin, other chemotherapy agents, other  medicines, foods, dyes, or preservatives -pregnant or trying to get pregnant -breast-feeding How should I use this medicine? This drug is given as an infusion into a vein. It is administered in a hospital or clinic by a specially trained health care professional. If you have pain, swelling, burning or any unusual feeling around the site of your injection, tell your health care professional right away. Talk to your pediatrician regarding the use of this medicine in children. Special care may be needed. Overdosage: If you think you have taken too much of this medicine contact a poison control center or emergency room at once. NOTE: This medicine is only for you. Do not share this medicine with others. What if I miss a dose? It is important not to miss your dose. Call your doctor or health care professional if you are unable to keep an appointment. What may interact with this medicine? This medicine may interact with the following medications: -6-mercaptopurine -paclitaxel -phenytoin -St. John's Wort -trastuzumab -verapamil This list may not describe all possible interactions. Give your health care provider a list of all the medicines, herbs, non-prescription drugs, or dietary supplements you use. Also tell them if you smoke, drink alcohol, or use illegal drugs. Some items may interact with your medicine. What should I watch for while using this medicine? This drug may make you feel generally unwell. This is not uncommon, as chemotherapy can affect healthy cells as well as cancer cells. Report any side effects. Continue your course of treatment even though you feel ill unless your doctor tells you to stop. There is a maximum amount of this medicine you should receive throughout your life. The amount depends  medical condition being treated and your overall health. Your doctor will watch how much of this medicine you receive in your lifetime. Tell your doctor if you have taken this medicine before. You  may need blood work done while you are taking this medicine. Your urine may turn red for a few days after your dose. This is not blood. If your urine is dark or brown, call your doctor. In some cases, you may be given additional medicines to help with side effects. Follow all directions for their use. Call your doctor or health care professional for advice if you get a fever, chills or sore throat, or other symptoms of a cold or flu. Do not treat yourself. This drug decreases your body's ability to fight infections. Try to avoid being around people who are sick. This medicine may increase your risk to bruise or bleed. Call your doctor or health care professional if you notice any unusual bleeding. Talk to your doctor about your risk of cancer. You may be more at risk for certain types of cancers if you take this medicine. Do not become pregnant while taking this medicine or for 6 months after stopping it. Women should inform their doctor if they wish to become pregnant or think they might be pregnant. Men should not father a child while taking this medicine and for 6 months after stopping it. There is a potential for serious side effects to an unborn child. Talk to your health care professional or pharmacist for more information. Do not breast-feed an infant while taking this medicine. This medicine has caused ovarian failure in some women and reduced sperm counts in some men This medicine may interfere with the ability to have a child. Talk with your doctor or health care professional if you are concerned about your fertility. What side effects may I notice from receiving this medicine? Side effects that you should report to your doctor or health care professional as soon as possible: -allergic reactions like skin rash, itching or hives, swelling of the face, lips, or tongue -breathing problems -chest pain -fast or irregular heartbeat -low blood counts - this medicine may decrease the number of white  blood cells, red blood cells and platelets. You may be at increased risk for infections and bleeding. -pain, redness, or irritation at site where injected -signs of infection - fever or chills, cough, sore throat, pain or difficulty passing urine -signs of decreased platelets or bleeding - bruising, pinpoint red spots on the skin, black, tarry stools, blood in the urine -swelling of the ankles, feet, hands -tiredness -weakness Side effects that usually do not require medical attention (report to your doctor or health care professional if they continue or are bothersome): -diarrhea -hair loss -mouth sores -nail discoloration or damage -nausea -red colored urine -vomiting This list may not describe all possible side effects. Call your doctor for medical advice about side effects. You may report side effects to FDA at 1-800-FDA-1088. Where should I keep my medicine? This drug is given in a hospital or clinic and will not be stored at home. NOTE: This sheet is a summary. It may not cover all possible information. If you have questions about this medicine, talk to your doctor, pharmacist, or health care provider.  2018 [employer]vier/Gold Standard (2015-09-17 11:28:51)  Cyclophosphamide injection What is this medicine? CYCLOPHOSPHAMIDE (sye kloe FOSS fa mide) is a chemotherapy drug. It slows the growth of cancer cells. This medicine is used to treat many types of cancer like lymphoma, myeloma,   leukemia, breast cancer, and ovarian cancer, to name a few. This medicine may be used for other purposes; ask your health care provider or pharmacist if you have questions. COMMON BRAND NAME(S): Cytoxan, Neosar What should I tell my health care provider before I take this medicine? They need to know if you have any of these conditions: -blood disorders -history of other chemotherapy -infection -kidney disease -liver disease -recent or ongoing radiation therapy -tumors in the bone marrow -an unusual or  allergic reaction to cyclophosphamide, other chemotherapy, other medicines, foods, dyes, or preservatives -pregnant or trying to get pregnant -breast-feeding How should I use this medicine? This drug is usually given as an injection into a vein or muscle or by infusion into a vein. It is administered in a hospital or clinic by a specially trained health care professional. Talk to your pediatrician regarding the use of this medicine in children. Special care may be needed. Overdosage: If you think you have taken too much of this medicine contact a poison control center or emergency room at once. NOTE: This medicine is only for you. Do not share this medicine with others. What if I miss a dose? It is important not to miss your dose. Call your doctor or health care professional if you are unable to keep an appointment. What may interact with this medicine? This medicine may interact with the following medications: -amiodarone -amphotericin B -azathioprine -certain antiviral medicines for HIV or AIDS such as protease inhibitors (e.g., indinavir, ritonavir) and zidovudine -certain blood pressure medications such as benazepril, captopril, enalapril, fosinopril, lisinopril, moexipril, monopril, perindopril, quinapril, ramipril, trandolapril -certain cancer medications such as anthracyclines (e.g., daunorubicin, doxorubicin), busulfan, cytarabine, paclitaxel, pentostatin, tamoxifen, trastuzumab -certain diuretics such as chlorothiazide, chlorthalidone, hydrochlorothiazide, indapamide, metolazone -certain medicines that treat or prevent blood clots like warfarin -certain muscle relaxants such as succinylcholine -cyclosporine -etanercept -indomethacin -medicines to increase blood counts like filgrastim, pegfilgrastim, sargramostim -medicines used as general anesthesia -metronidazole -natalizumab This list may not describe all possible interactions. Give your health care provider a list of all the  medicines, herbs, non-prescription drugs, or dietary supplements you use. Also tell them if you smoke, drink alcohol, or use illegal drugs. Some items may interact with your medicine. What should I watch for while using this medicine? Visit your doctor for checks on your progress. This drug may make you feel generally unwell. This is not uncommon, as chemotherapy can affect healthy cells as well as cancer cells. Report any side effects. Continue your course of treatment even though you feel ill unless your doctor tells you to stop. Drink water or other fluids as directed. Urinate often, even at night. In some cases, you may be given additional medicines to help with side effects. Follow all directions for their use. Call your doctor or health care professional for advice if you get a fever, chills or sore throat, or other symptoms of a cold or flu. Do not treat yourself. This drug decreases your body's ability to fight infections. Try to avoid being around people who are sick. This medicine may increase your risk to bruise or bleed. Call your doctor or health care professional if you notice any unusual bleeding. Be careful brushing and flossing your teeth or using a toothpick because you may get an infection or bleed more easily. If you have any dental work done, tell your dentist you are receiving this medicine. You may get drowsy or dizzy. Do not drive, use machinery, or do anything that needs mental alertness until   you know how this medicine affects you. Do not become pregnant while taking this medicine or for 1 year after stopping it. Women should inform their doctor if they wish to become pregnant or think they might be pregnant. Men should not father a child while taking this medicine and for 4 months after stopping it. There is a potential for serious side effects to an unborn child. Talk to your health care professional or pharmacist for more information. Do not breast-feed an infant while taking  this medicine. This medicine may interfere with the ability to have a child. This medicine has caused ovarian failure in some women. This medicine has caused reduced sperm counts in some men. You should talk with your doctor or health care professional if you are concerned about your fertility. If you are going to have surgery, tell your doctor or health care professional that you have taken this medicine. What side effects may I notice from receiving this medicine? Side effects that you should report to your doctor or health care professional as soon as possible: -allergic reactions like skin rash, itching or hives, swelling of the face, lips, or tongue -low blood counts - this medicine may decrease the number of white blood cells, red blood cells and platelets. You may be at increased risk for infections and bleeding. -signs of infection - fever or chills, cough, sore throat, pain or difficulty passing urine -signs of decreased platelets or bleeding - bruising, pinpoint red spots on the skin, black, tarry stools, blood in the urine -signs of decreased red blood cells - unusually weak or tired, fainting spells, lightheadedness -breathing problems -dark urine -dizziness -palpitations -swelling of the ankles, feet, hands -trouble passing urine or change in the amount of urine -weight gain -yellowing of the eyes or skin Side effects that usually do not require medical attention (report to your doctor or health care professional if they continue or are bothersome): -changes in nail or skin color -hair loss -missed menstrual periods -mouth sores -nausea, vomiting This list may not describe all possible side effects. Call your doctor for medical advice about side effects. You may report side effects to FDA at 1-800-FDA-1088. Where should I keep my medicine? This drug is given in a hospital or clinic and will not be stored at home. NOTE: This sheet is a summary. It may not cover all possible  information. If you have questions about this medicine, talk to your doctor, pharmacist, or health care provider.  2018 Elsevier/Gold Standard (2012-06-04 16:22:58)  

## 2017-10-13 ENCOUNTER — Other Ambulatory Visit (HOSPITAL_COMMUNITY): Payer: Medicare Other

## 2017-10-13 NOTE — Progress Notes (Signed)
During cyclophosphamide infusion (10/12/2017), patient experienced a "hot feeling" on the back of her neck. No flushing noted. Denies CP, dyspnea, discomfort, or other distress. Paused infusion and allowed symptoms to completely resolve before resuming. Resumed infusion and completed without further incident.

## 2017-10-15 ENCOUNTER — Encounter: Payer: Self-pay | Admitting: *Deleted

## 2017-10-15 ENCOUNTER — Telehealth: Payer: Self-pay | Admitting: *Deleted

## 2017-10-15 NOTE — Telephone Encounter (Signed)
TCT patient to follow up with her after her 1st Adriamycin/Cytoxan on 10/12/17. Spoke with patient. She states she is ok, just 'moving slow'.  She states she has had some nausea butis taking the zofran with relief.  She also states she has had some indigestion but was uncertain if she could take Tums for that. Reassured her that the Tums was fine to take, but to also let us know if it does not help.  Encouraged her to keep her fluid intake up. We reviewed which fluids would be good for her. She voiced understanding. She understands to call with any questions or concerns.  She is aware of her upcoming appts.

## 2017-10-15 NOTE — Telephone Encounter (Signed)
-----   Message from Sinda Du, RN sent at 10/12/2017  1:59 PM EDT ----- Regarding: Dr. Burr Medico - 1st chemo f/u 1st chemo f/u

## 2017-10-26 ENCOUNTER — Inpatient Hospital Stay: Payer: Medicare Other

## 2017-10-26 ENCOUNTER — Inpatient Hospital Stay (HOSPITAL_BASED_OUTPATIENT_CLINIC_OR_DEPARTMENT_OTHER): Payer: Medicare Other | Admitting: Nurse Practitioner

## 2017-10-26 ENCOUNTER — Encounter: Payer: Self-pay | Admitting: Nurse Practitioner

## 2017-10-26 VITALS — BP 135/116 | HR 71 | Temp 97.7°F | Resp 18 | Ht 68.0 in | Wt 195.3 lb

## 2017-10-26 VITALS — BP 138/83 | HR 76

## 2017-10-26 DIAGNOSIS — Z5111 Encounter for antineoplastic chemotherapy: Secondary | ICD-10-CM | POA: Diagnosis not present

## 2017-10-26 DIAGNOSIS — Z171 Estrogen receptor negative status [ER-]: Secondary | ICD-10-CM | POA: Diagnosis not present

## 2017-10-26 DIAGNOSIS — G4709 Other insomnia: Secondary | ICD-10-CM | POA: Diagnosis not present

## 2017-10-26 DIAGNOSIS — C50412 Malignant neoplasm of upper-outer quadrant of left female breast: Secondary | ICD-10-CM

## 2017-10-26 DIAGNOSIS — G8929 Other chronic pain: Secondary | ICD-10-CM | POA: Diagnosis not present

## 2017-10-26 DIAGNOSIS — K5903 Drug induced constipation: Secondary | ICD-10-CM

## 2017-10-26 DIAGNOSIS — M2550 Pain in unspecified joint: Secondary | ICD-10-CM

## 2017-10-26 DIAGNOSIS — K521 Toxic gastroenteritis and colitis: Secondary | ICD-10-CM | POA: Diagnosis not present

## 2017-10-26 DIAGNOSIS — R110 Nausea: Secondary | ICD-10-CM | POA: Diagnosis not present

## 2017-10-26 DIAGNOSIS — Z95828 Presence of other vascular implants and grafts: Secondary | ICD-10-CM

## 2017-10-26 LAB — CBC WITH DIFFERENTIAL (CANCER CENTER ONLY)
Basophils Absolute: 0.1 10*3/uL (ref 0.0–0.1)
Basophils Relative: 2 %
EOS PCT: 2 %
Eosinophils Absolute: 0.1 10*3/uL (ref 0.0–0.5)
HCT: 35.6 % (ref 34.8–46.6)
Hemoglobin: 11.7 g/dL (ref 11.6–15.9)
LYMPHS ABS: 1.7 10*3/uL (ref 0.9–3.3)
Lymphocytes Relative: 31 %
MCH: 29.8 pg (ref 25.1–34.0)
MCHC: 32.9 g/dL (ref 31.5–36.0)
MCV: 90.6 fL (ref 79.5–101.0)
MONO ABS: 0.5 10*3/uL (ref 0.1–0.9)
MONOS PCT: 9 %
Neutro Abs: 3.1 10*3/uL (ref 1.5–6.5)
Neutrophils Relative %: 56 %
PLATELETS: 246 10*3/uL (ref 145–400)
RBC: 3.93 MIL/uL (ref 3.70–5.45)
RDW: 13.1 % (ref 11.2–14.5)
WBC Count: 5.4 10*3/uL (ref 3.9–10.3)

## 2017-10-26 LAB — CMP (CANCER CENTER ONLY)
ALT: 17 U/L (ref 0–55)
AST: 17 U/L (ref 5–34)
Albumin: 3.5 g/dL (ref 3.5–5.0)
Alkaline Phosphatase: 104 U/L (ref 40–150)
Anion gap: 8 (ref 3–11)
BUN: 7 mg/dL (ref 7–26)
CHLORIDE: 108 mmol/L (ref 98–109)
CO2: 27 mmol/L (ref 22–29)
Calcium: 8.8 mg/dL (ref 8.4–10.4)
Creatinine: 0.75 mg/dL (ref 0.60–1.10)
GFR, Est AFR Am: >60 mL/min (ref >60)
GFR, Estimated: >60 mL/min (ref >60)
Glucose, Bld: 91 mg/dL (ref 70–140)
Potassium: 4.1 mmol/L (ref 3.5–5.1)
Sodium: 143 mmol/L (ref 136–145)
Total Bilirubin: <0.2 mg/dL — ABNORMAL LOW (ref 0.2–1.2)
Total Protein: 6.5 g/dL (ref 6.4–8.3)

## 2017-10-26 MED ORDER — SODIUM CHLORIDE 0.9 % IV SOLN
Freq: Once | INTRAVENOUS | Status: AC
  Administered 2017-10-26: 13:00:00 via INTRAVENOUS
  Filled 2017-10-26: qty 5

## 2017-10-26 MED ORDER — SODIUM CHLORIDE 0.9 % IV SOLN
600.0000 mg/m2 | Freq: Once | INTRAVENOUS | Status: AC
  Administered 2017-10-26: 1240 mg via INTRAVENOUS
  Filled 2017-10-26: qty 62

## 2017-10-26 MED ORDER — DOXORUBICIN HCL CHEMO IV INJECTION 2 MG/ML
60.0000 mg/m2 | Freq: Once | INTRAVENOUS | Status: AC
  Administered 2017-10-26: 124 mg via INTRAVENOUS
  Filled 2017-10-26: qty 62

## 2017-10-26 MED ORDER — HYDROCODONE-ACETAMINOPHEN 5-325 MG PO TABS: 1.0000 | ORAL_TABLET | Freq: Four times a day (QID) | ORAL | 0 refills | Status: DC | PRN

## 2017-10-26 MED ORDER — PEGFILGRASTIM 6 MG/0.6ML ~~LOC~~ PSKT
PREFILLED_SYRINGE | SUBCUTANEOUS | Status: AC
  Filled 2017-10-26: qty 0.6

## 2017-10-26 MED ORDER — MIRTAZAPINE 7.5 MG PO TABS: 7.5000 mg | ORAL_TABLET | Freq: Every day | ORAL | 0 refills | Status: DC

## 2017-10-26 MED ORDER — SODIUM CHLORIDE 0.9% FLUSH
10.0000 mL | Freq: Once | INTRAVENOUS | Status: AC
  Administered 2017-10-26: 10 mL
  Filled 2017-10-26: qty 10

## 2017-10-26 MED ORDER — HEPARIN SOD (PORK) LOCK FLUSH 100 UNIT/ML IV SOLN
500.0000 [IU] | Freq: Once | INTRAVENOUS | Status: AC | PRN
  Administered 2017-10-26: 500 [IU]
  Filled 2017-10-26: qty 5

## 2017-10-26 MED ORDER — PALONOSETRON HCL INJECTION 0.25 MG/5ML
0.2500 mg | Freq: Once | INTRAVENOUS | Status: AC
  Administered 2017-10-26: 0.25 mg via INTRAVENOUS

## 2017-10-26 MED ORDER — SODIUM CHLORIDE 0.9 % IV SOLN
Freq: Once | INTRAVENOUS | Status: AC
  Administered 2017-10-26: 13:00:00 via INTRAVENOUS

## 2017-10-26 MED ORDER — PALONOSETRON HCL INJECTION 0.25 MG/5ML
INTRAVENOUS | Status: AC
  Filled 2017-10-26: qty 5

## 2017-10-26 MED ORDER — PEGFILGRASTIM 6 MG/0.6ML ~~LOC~~ PSKT
6.0000 mg | PREFILLED_SYRINGE | Freq: Once | SUBCUTANEOUS | Status: AC
  Administered 2017-10-26: 6 mg via SUBCUTANEOUS

## 2017-10-26 MED ORDER — SODIUM CHLORIDE 0.9% FLUSH
10.0000 mL | INTRAVENOUS | Status: DC | PRN
  Administered 2017-10-26: 10 mL
  Filled 2017-10-26: qty 10

## 2017-10-26 NOTE — Progress Notes (Signed)
[place]  [phone] [phone]  Clinic Follow up Note   Patient Care Team: [name], MD as PCP - General (Family Medicine) 10/26/2017  SUMMARY OF ONCOLOGIC HISTORY: Oncology History   Cancer Staging Malignant neoplasm of upper-outer quadrant of left breast in female, estrogen receptor negative ([place]) Staging form: Breast, AJCC 8th Edition - Pathologic stage from 08/19/2017: Stage IIA (pT2, pN0(sn), cM0, G3, ER: Negative, PR: Negative, HER2: Negative) - Signed by Ardath Sax, MD on 09/01/2017       Malignant neoplasm of upper-outer quadrant of left breast in female, estrogen receptor negative ([place])   06/16/2017 Initial Diagnosis    Malignant neoplasm of upper-outer quadrant of left breast in female: Initially presented with left deltoid mass which was consistent with lipoma.  During examination, a mass in the left breast was appreciated by palpation.      06/16/2017 Mammogram    BL Diagnostic Study: Irregular hyperdense mass in the upper outer quadrant of the left breast measuring 3.6 cm without radiographically detectable lymphadenopathy.  Normal appearance of the right breast.      06/16/2017 Breast US    3.2 cm irregular mass located at 2:30 o'clock, no pathological lymphadenopathy.      06/23/2017 Pathology Results    Ultrasound-guided biopsy: Positive for invasive ductal carcinoma, grade 3, ER 0%, PR 0%, HER-2 negative by [facility]      07/31/2017 Surgery    Partial left mastectomy with sentinel lymph node biopsy --Pathology: Invasive ductal carcinoma, measuring 4.5 cm, grade 3.  All margins negative, but the anterior margin less than 0.1 cm.  No surrounding DCIS.  0/5 lymph nodes positive for malignancy      10/08/2017 PET scan    IMPRESSION: 1. No evidence breast cancer metastasis on FDG PET-CT scan. 2. Postsurgical change in the LEFT lateral breast and LEFT axilla.     CURRENT THERAPY: Adjuvant chemotherapy AC every 2  weeks for 4 cycles, followed by  bi-weekly Taxol for 4 cycles (or weekly for 12 weeks), starting on 10/12/17  INTERVAL HISTORY: Mary Wise returns for follow-up as scheduled prior to cycle 2 AC with Onpro.  She received cycle 1 on 10/12/2017.  On day 3 she developed mild nausea without emesis, she reportedly "mixed up her medication" and was confused on how to take antiemetics.  She spoke with the pharmacist the next day to clarify, began taking ondansetron alternating with Compazine, and nausea improved.  She had moderate fatigue for 5 days after chemo, only able to tolerate short periods of activity. On day 6 she woke up feeling good.  She had decreased appetite and altered taste for 3 days after treatment which then improved, she continued to drink well water and decaf tea.  She experienced 3-6 episodes of diarrhea per day  x4 days after chemotherapy.  She has chronic constipation and takes 2 Senokot daily and one laxative per week at baseline.  Has chronic pain in her shins, knees, and hips with cramping, joint pain increased temporarily after Neulasta.  Takes hydrocodone as needed. Claritin helped some. Has occasional nonproductive "sinus cough", no fever, chills, chest pain, or dyspnea. She has chronic insomnia, was on [place] x30 years but previous provider took her off of it. Has tried benadryl, melatonin, and trazadone without relief. She gets about 2 hours of sleep after xanax.   REVIEW OF SYSTEMS:   Constitutional: Denies fevers, chills or abnormal weight loss (+) moderate fatigue x5 days after AC (+) decreased appetite and  altered taste x3 days after [facility] Eyes: Denies blurriness of vision Ears, nose, mouth, throat, and face: Denies mucositis or sore throat Respiratory: Denies dyspnea or wheezes (+) periodic dry cough Cardiovascular: Denies palpitation, chest discomfort or lower extremity swelling Gastrointestinal:  Denies heartburn, emesis (+) chronic constipation at baseline, takes 2 senokot daily  and 1 laxative weekly (+) diarrhea x4 days after AC, 3-6 episodes per day  Skin: Denies abnormal skin rashes Lymphatics: Denies new lymphadenopathy or easy bruising Neurological:Denies  new weaknesses (+) numbness/tingling to left hand 2nd, 3rd, 4th fingers secondary to LUE lipoma Behavioral/Psych: Mood is stable, no new changes (+) chronic insomnia MSK: (+) chronic shin, knee, hip pain, increased with neulasta (+) muscle cramps  All other systems were reviewed with the patient and are negative.  MEDICAL HISTORY:  Past Medical History:  Diagnosis Date  . Anxiety   . Asthma   . Cancer [facility])    left breast cancer  . Hemorrhoids   . Lipoma of left shoulder   . Migraine   . Migraine   . 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 ABDOMINAL HYSTERECTOMY    . APPENDECTOMY    . CESAREAN SECTION    . CHOLECYSTECTOMY    . ERCP    . IR FLUORO GUIDE PORT INSERTION RIGHT  10/05/2017  . IR US GUIDE VASC ACCESS RIGHT  10/05/2017  . LIVER SURGERY    . PARTIAL MASTECTOMY WITH AXILLARY SENTINEL LYMPH NODE BIOPSY Left 07/31/2017   Procedure: LEFT PARTIAL MASTECTOMY WITH AXILLARY SENTINEL LYMPH NODE BIOPSY;  Surgeon: Virl Cagey, MD;  Location: [facility];  Service: General;  Laterality: Left;    I have reviewed the social history and family history with the patient and they are unchanged from previous note.  ALLERGIES:  is allergic to onion; penicillins; sulfa antibiotics; yellow dyes (non-tartrazine); and clindamycin/lincomycin.  MEDICATIONS:  Current Outpatient Medications  Medication Sig Dispense Refill  . albuterol (PROVENTIL HFA;VENTOLIN HFA) 108 (90 Base) MCG/ACT inhaler Inhale 2 puffs into the lungs every 6 (six) hours as needed for wheezing or shortness of breath.    . ALPRAZolam (XANAX) 1 MG tablet Take 1 tablet (1 mg total) by mouth daily as needed for anxiety. 30 tablet 0  . Menthol, Topical  Analgesic, (BIOFREEZE EX) Apply 1 application topically as needed (pain).    . ondansetron (ZOFRAN) 8 MG tablet Take 1 tablet (8 mg total) by mouth 2 (two) times daily as needed. Start on the third day after chemotherapy. 30 tablet 1  . oxyCODONE (OXY IR/ROXICODONE) 5 MG immediate release tablet Take 5 mg by mouth every 4 (four) hours as needed for severe pain.    Marland Kitchen prochlorperazine (COMPAZINE) 10 MG tablet Take 10 mg by mouth every 6 (six) hours as needed for nausea or vomiting.    Marland Kitchen Bioflavonoid Products (BIOFLEX) TABS Take 1 tablet by mouth daily.    Marland Kitchen HYDROcodone-acetaminophen (NORCO) 5-325 MG tablet Take 1 tablet by mouth every 6 (six) hours as needed for moderate pain. 15 tablet 0  . Hypromellose (ARTIFICIAL TEARS OP) Apply to eye 2 (two) times daily. Doesn't do drops in eyes does as a wash to flush eyes out    . ibuprofen (ADVIL,MOTRIN) 800 MG tablet Take 800 mg by mouth every 8 (eight) hours as needed.    . lidocaine (LIDODERM) 5 % Place 0.5-1 patches onto the skin daily as needed (pain).  Remove & Discard patch within 12 hours or as directed by MD    . lidocaine-prilocaine (EMLA) cream Apply 1 application topically as needed. 30 g 2  . mirtazapine (REMERON) 7.5 MG tablet Take 1 tablet (7.5 mg total) by mouth at bedtime. 30 tablet 0   No current facility-administered medications for this visit.    Facility-Administered Medications Ordered in Other Visits  Medication Dose Route Frequency Provider Last Rate Last Dose  . 0.9 %  sodium chloride infusion   Intravenous Once Truitt Merle, MD      . cyclophosphamide (CYTOXAN) 1,240 mg in sodium chloride 0.9 % 250 mL chemo infusion  600 mg/m2 (Treatment Plan Recorded) Intravenous Once Truitt Merle, MD      . DOXOrubicin (ADRIAMYCIN) chemo injection 124 mg  60 mg/m2 (Treatment Plan Recorded) Intravenous Once Truitt Merle, MD      . fosaprepitant (EMEND) 150 mg, dexamethasone (DECADRON) 12 mg in sodium chloride 0.9 % 145 mL IVPB   Intravenous Once Truitt Merle,  MD      . heparin lock flush 100 unit/mL  500 Units Intracatheter Once PRN Truitt Merle, MD      . palonosetron (ALOXI) injection 0.25 mg  0.25 mg Intravenous Once Truitt Merle, MD      . pegfilgrastim (NEULASTA ONPRO KIT) injection 6 mg  6 mg Subcutaneous Once Truitt Merle, MD      . sodium chloride flush (NS) 0.9 % injection 10 mL  10 mL Intracatheter PRN Truitt Merle, MD        PHYSICAL EXAMINATION: ECOG PERFORMANCE STATUS: 1 - Symptomatic but completely ambulatory  Vitals:   10/26/17 1135  BP: (!) 135/116  Pulse: 71  Resp: 18  Temp: 97.7 F (36.5 C)  SpO2: 97%   Filed Weights   10/26/17 1135  Weight: 195 lb 4.8 oz (88.6 kg)    GENERAL:alert, no distress and comfortable SKIN: skin color, texture, turgor are normal, no rashes or significant lesions EYES: normal, Conjunctiva are pink and non-injected, sclera clear OROPHARYNX:no exudate, no erythema and lips, buccal mucosa, and tongue normal  LYMPH:  no palpable cervical, supraclavicular, or axillary lymphadenopathy  LUNGS: clear to auscultation with normal breathing effort HEART: regular rate & rhythm and no murmurs and no lower extremity edema ABDOMEN:abdomen soft, non-tender and normal bowel sounds. No hepatomegaly  Musculoskeletal:no cyanosis of digits and no clubbing  NEURO: alert & oriented x 3 with fluent speech, no focal motor/sensory deficits BREASTS: no palpable mass in bilateral breasts or axilla. (+) left breast surgical incisions x2 are well healed  PAC without erythema   LABORATORY DATA:  I have reviewed the data as listed CBC Latest Ref Rng & Units 10/26/2017 10/12/2017 10/05/2017  WBC 3.9 - 10.3 K/uL 5.4 6.2 6.0  Hemoglobin 12.0 - 15.0 g/dL - - 13.1  Hematocrit 34.8 - 46.6 % 35.6 40.0 40.2  Platelets 145 - 400 K/uL 246 295 337     CMP Latest Ref Rng & Units 10/26/2017 10/12/2017 08/19/2017  Glucose 70 - 140 mg/dL 91 90 92  BUN 7 - 26 mg/dL '7 14 16  ' Creatinine 0.60 - 1.10 mg/dL 0.75 0.77 0.86  Sodium 136 - 145 mmol/L  143 141 139  Potassium 3.5 - 5.1 mmol/L 4.1 3.6 3.6  Chloride 98 - 109 mmol/L 108 105 103  CO2 22 - 29 mmol/L '27 26 25  ' Calcium 8.4 - 10.4 mg/dL 8.8 9.7 9.2  Total Protein 6.4 - 8.3 g/dL 6.5 7.6 7.9  Total Bilirubin 0.2 - 1.2  mg/dL <0.2(L) 0.3 0.7  Alkaline Phos 40 - 150 U/L 104 121 106  AST 5 - 34 U/L '17 21 26  ' ALT 0 - 55 U/L '17 28 22   ' PATHOLOGY  Initial biopsy  Diagnosis 07/02/17 Breast, left, needle core biopsy, 2:00 - INVASIVE DUCTAL CARCINOMA. - SEE COMMENT. Microscopic Comment The carcinoma appears grade III. A breast prognostic profile will be performed and the results reported separately. The results were called to [facility] on 06/24/2017. (JBK:ecj 06/24/2017) Results: HER2 - NEGATIVE RATIO OF HER2/CEP17 SIGNALS 1.60 AVERAGE HER2 COPY NUMBER PER CELL 3.60 Results: IMMUNOHISTOCHEMICAL AND MORPHOMETRIC ANALYSIS PERFORMED MANUALLY Estrogen Receptor: 0%, NEGATIVE Progesterone Receptor: 0%, NEGATIVE Proliferation Marker Ki67: 90%  Surgical pathology  Diagnosis 07/31/18 1. Lymph node, sentinel, biopsy, left - ONE OF ONE LYMPH NODES NEGATIVE FOR CARCINOMA (0/1). 2. 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 - INVASIVE CARCINOMA IS <0.1 CM OF THE ANTERIOR MARGIN BROADLY. - SEE ONCOLOGY TABLE. 3. Breast, excision, left deep - BENIGN BREAST TISSUE. 4. Breast, excision, left superior - BENIGN BREAST TISSUE. 5. Breast, excision, left medial - BENIGN BREAST TISSUE. 6. Breast, excision, left inferior - BENIGN BREAST TISSUE. 7. Breast, excision, left lateral - BENIGN BREAST TISSUE. 8. Lymph node, sentinel, biopsy - ONE OF ONE LYMPH NODES NEGATIVE FOR CARCINOMA (0/1). 9. Lymph node, sentinel, biopsy - ONE OF ONE LYMPH NODES NEGATIVE FOR CARCINOMA (0/1). 10. Lymph node, sentinel, biopsy - ONE OF ONE LYMPH NODES NEGATIVE FOR CARCINOMA (0/1). 11. Lymph node, sentinel, biopsy - ONE OF ONE LYMPH NODES  NEGATIVE FOR CARCINOMA (0/1). Microscopic Comment 2. BREAST, INVASIVE TUMOR Procedure: Left partial mastectomy with additional margins and left sentinel lymph node biopsies. Laterality: Left. 1 of 4 FINAL for LARRY, KNIPP ([id number]) Microscopic Comment(continued) Tumor Size: 4.5 cm. Histologic Type: Invasive ductal carcinoma. Grade: 3 Tubular Differentiation: 2 Nuclear Pleomorphism: 3 Mitotic Count: 3 Ductal Carcinoma in Situ (DCIS): Not identified. Extent of Tumor: Confined to breast parenchyma. Margins: Invasive carcinoma, distance from closest margin: <0.1 cm of anterior margin broadly. Remaining final margins (parts 3-7) are >1 cm. DCIS, distance from closest margin: N/A. Regional Lymph Nodes: Number of Lymph Nodes Examined: 5 Number of Sentinel Lymph Nodes Examined: 5 Lymph Nodes with Macrometastases: 0 Lymph Nodes with Micrometastases: 0 Lymph Nodes with Isolated Tumor Cells: 0 Breast Prognostic Profile: Performed on biopsy [id number], see below. Will not be repeated. Estrogen Receptor: Negative. Progesterone Receptor: Negative. Her2: Negative (ratio 1.60). Ki-67: 90%. Best tumor block for sendout testing: 2D Pathologic Stage Classification (pTNM, AJCC 8th Edition): Primary Tumor (pT): pT2 Regional Lymph Nodes (pN): pN0 Distant Metastases (pM): pMX Comments: None.   RADIOGRAPHIC STUDIES: I have personally reviewed the radiological images as listed and agreed with the findings in the report. No results found.   ASSESSMENT & PLAN: Mary Wise 67 y.o. 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  1. 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  Proliferation marker Ki67 at 90% -Ms. Belknap appears stable. She completed cycle 1 AC with Onpro 10/12/17. She tolerated moderately well overall with mild nausea, moderate fatigue, and change in  bowel habits. She has recovered well today. Had increase in her chronic joint pain after nuelasta, she took claritin. I refilled Norco for her today. Weight stable. BP markedly elevated, will recheck in infusion after period of rest. CBC  and CMP reviewed, adequate for treatment. Proceed with cycle 2 AC with Onpro today. Return in 2 weeks for f/u and cycle 3.   2. Anxiety  -Xanax PRN, she reports her mood is positive overall.  3. Large LUE lipoma  -Pending surgery at [facility] after adjuvant therapy  4. Genetics-Referred on 10/12/17, appointment on 11/09/17 5. Insomnia  -She has chronic insomnia, tried trazadone, melatonin, and benadryl in the past. She was historically on [place] x30 years but a previous provider took her off it. She gets approx 2 hours of sleep with xanax. I reviewed sleep hygiene with her. Will try Remeron qhs to see if this helps. Prescribed today.   6. Nausea, constipation/diarrhea, secondary to chemotherapy -She had mild nausea without emesis days 2-5 after cycle 1 AC. She began alternating compazine and zofran on day 3 which helped. I encouraged her to continue this regimen and reviewed dosing instructions.  -for chronic constipation, she takes 2 senokot daily and 1 laxative weekly PRN. She experienced alternating constipation and diarrhea after treatment. I recommend she take 1 senokot daily or QOD, laxative only as needed. If she experiences diarrhea she will stop meds altogether. I encouraged her to have imodium on hand for diarrhea.   PLAN -Labs reviewed, proceed with cycle 2 AC with Onpro today, continue q2 weeks for total 4 cycles -Begin Remeron for insomnia, prescription sent  -Refilled Norco for pain -Recheck BP in infusion after period of rest -Return in 2 weeks for f/u and cycle 3 AC with Onpro -Decrease senokot to 1 daily or QOD, laxative PRN, discontinue if you develop diarrhea   All questions were answered. The patient knows to call the clinic with any problems,  questions or concerns. No barriers to learning was detected.     Alla Feeling, NP 10/26/17

## 2017-10-26 NOTE — Patient Instructions (Signed)
[facility] Discharge Instructions for Patients Receiving Chemotherapy  Today you received the following chemotherapy agents Adriamycin and Cytoxan  To help prevent nausea and vomiting after your treatment, we encourage you to take your nausea medication as directed.  If you develop nausea and vomiting that is not controlled by your nausea medication, call the clinic.   BELOW ARE SYMPTOMS THAT SHOULD BE REPORTED IMMEDIATELY:  *FEVER GREATER THAN 100.5 F  *CHILLS WITH OR WITHOUT FEVER  NAUSEA AND VOMITING THAT IS NOT CONTROLLED WITH YOUR NAUSEA MEDICATION  *UNUSUAL SHORTNESS OF BREATH  *UNUSUAL BRUISING OR BLEEDING  TENDERNESS IN MOUTH AND THROAT WITH OR WITHOUT PRESENCE OF ULCERS  *URINARY PROBLEMS  *BOWEL PROBLEMS  UNUSUAL RASH Items with * indicate a potential emergency and should be followed up as soon as possible.  Feel free to call the clinic should you have any questions or concerns. The clinic phone number is [phone].  Please show the CHEMO ALERT CARD at check-in to the Emergency Department and triage nurse.   

## 2017-10-27 ENCOUNTER — Telehealth: Payer: Self-pay | Admitting: Hematology

## 2017-10-27 NOTE — Telephone Encounter (Signed)
No los 3/25 

## 2017-10-27 NOTE — Telephone Encounter (Signed)
No LOS 3/25

## 2017-11-03 ENCOUNTER — Other Ambulatory Visit: Payer: Self-pay | Admitting: Hematology

## 2017-11-03 DIAGNOSIS — Z171 Estrogen receptor negative status [ER-]: Principal | ICD-10-CM

## 2017-11-03 DIAGNOSIS — C50412 Malignant neoplasm of upper-outer quadrant of left female breast: Secondary | ICD-10-CM

## 2017-11-04 ENCOUNTER — Other Ambulatory Visit: Payer: Self-pay | Admitting: *Deleted

## 2017-11-04 MED ORDER — ALPRAZOLAM 1 MG PO TABS: 1.0000 mg | ORAL_TABLET | Freq: Every day | ORAL | 0 refills | Status: DC | PRN

## 2017-11-04 MED ORDER — PROCHLORPERAZINE MALEATE 10 MG PO TABS: 10.0000 mg | ORAL_TABLET | Freq: Four times a day (QID) | ORAL | 1 refills | Status: DC | PRN

## 2017-11-06 NOTE — Progress Notes (Signed)
[place]  [phone] [phone]) [phone]  Clinic Follow up Note   Patient Care Team: [name], MD as PCP - General (Family Medicine) 11/09/2017   CHIEF COMPLAINT: F/u triple negative breast cancer   SUMMARY OF ONCOLOGIC HISTORY: Oncology History   Cancer Staging Malignant neoplasm of upper-outer quadrant of left breast in female, estrogen receptor negative ([place]) Staging form: Breast, AJCC 8th Edition - Pathologic stage from 08/19/2017: Stage IIA (pT2, pN0(sn), cM0, G3, ER: Negative, PR: Negative, HER2: Negative) - Signed by Ardath Sax, MD on 09/01/2017       Malignant neoplasm of upper-outer quadrant of left breast in female, estrogen receptor negative ([place])   06/16/2017 Initial Diagnosis    Malignant neoplasm of upper-outer quadrant of left breast in female: Initially presented with left deltoid mass which was consistent with lipoma.  During examination, a mass in the left breast was appreciated by palpation.      06/16/2017 Mammogram    BL Diagnostic Study: Irregular hyperdense mass in the upper outer quadrant of the left breast measuring 3.6 cm without radiographically detectable lymphadenopathy.  Normal appearance of the right breast.      06/16/2017 Breast US    3.2 cm irregular mass located at 2:30 o'clock, no pathological lymphadenopathy.      06/23/2017 Pathology Results    Ultrasound-guided biopsy: Positive for invasive ductal carcinoma, grade 3, ER 0%, PR 0%, HER-2 negative by [facility]      07/31/2017 Surgery    Partial left mastectomy with sentinel lymph node biopsy --Pathology: Invasive ductal carcinoma, measuring 4.5 cm, grade 3.  All margins negative, but the anterior margin less than 0.1 cm.  No surrounding DCIS.  0/5 lymph nodes positive for malignancy      10/08/2017 PET scan    IMPRESSION: 1. No evidence breast cancer metastasis on FDG PET-CT scan. 2. Postsurgical change in the LEFT lateral breast and LEFT axilla.      HISTORY OF PRESENT ILLNESS: Mary Wise 67 y.o. female presents to the [facility] for follow up her left breast cancer that has recently been surgically resected. The patient was referred by Dr. Isidore Moos.  She was previously seen by my partner Dr. Lebron Conners at [facility].  Patient states she noticed a lump in her left breast after she had knocked into her bed and felt pain in the area. 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 She saw her PCP and during the evaluation, a mass was discovered in the lateral aspect of the left breast leading to additional evaluation. She had been getting regular mammograms yearly before this with one abnormal result in the past that revealed fatty tissue.  Her diagnostic mammogram from 06/16/17 revealed a suspicious mass in the 230 o'clock location of the left breast with her initial biopsy confirming invasive ductal carcinoma. Prognostic indicators significant for: ER, 0% negative and PR, 0% negative. Proliferation marker Ki67 at 90%. HER2 negative. Pt underwent a left partial mastectomy with sentinel node biopsy with Dr. Constance Haw on 07/31/17. Surgical pathology results revealed invasive ductal carcinoma and DCIS, grade III, spanning 4.5 cm. Lymph nodes biopsied were negative.  She met Dr. Lebron Conners one month ago, adjuvant chemotherapy was recommended, but she declined.  She was seen by radiation oncologist Dr. Isidore Moos yesterday, who encouraged her to consider adjuvant chemotherapy, and referred her to me.  In the past she was diagnosed with medical conditions  of anxiety, asthma, seizure and migraines. She has had gallstones that traveled to liver that required surgery, appendectomy, and complete hysterectomy.   She has a FHx of breast cancer, brain cancer, ovarian cancer, throat cancer, and lung cancer. She has a Hx of tobacco use for 9 years over 30 years ago. She  GYN HISTORY  Menarchal:  12 LMP: 60, complete hysterectomy  Contraceptive: HRT:  GP: 1 daughter, Mary Wise, she works in the ED at [employer] she is divorced and lives by herself in an assisted living facility in [place]. She states she is able to do everything for herself. She enjoys the company of people and she is able to get meals there.   CURRENT THERAPY: Adjuvant chemotherapy AC every 2 weeks for 4 cycles, followed by  bi-weekly Taxol for 4 cycles (or weekly for 12 weeks), starting on 10/12/17  INTERVAL HISTORY: Mary Wise is here for follow up and Cycle 3 AC. She presents to the clinic today accompanied by her friend. She reports she had fatigue with the second cycle but she has fully recovered now. She endorses a good appetite. She takes Senokot and stool softener each night. She reports she has nausea that she alternates Zofran and compazine for. She also reports that her right ankle is hurting intermittently after she sprained it 5 days ago.   On review of systems, pt denies diarrhea, or any other complaints at this time. Pertinent positives are listed and detailed within the above HPI.   REVIEW OF SYSTEMS:   Constitutional: Denies fevers, chills or abnormal weight loss (+) fatigue  Eyes: Denies blurriness of vision Ears, nose, mouth, throat, and face: Denies mucositis or sore throat Respiratory: Denies cough, dyspnea or wheezes Cardiovascular: Denies palpitation, chest discomfort or lower extremity swelling Gastrointestinal:  Denies heartburn or change in bowel habits (+) nausea Skin: Denies abnormal skin rashes Lymphatics: Denies new lymphadenopathy or easy bruising Neurological:Denies numbness, tingling or new weaknesses Behavioral/Psych:  (+) anxiety Extremity: (+) lipoma of the upper left arm  All other systems were reviewed with the patient and are negative.  MEDICAL HISTORY:  Past Medical History:  Diagnosis Date  . Anxiety   . Asthma   . Cancer [facility])    left breast cancer   . Hemorrhoids   . Lipoma of left shoulder   . Migraine   . Migraine   . 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 ABDOMINAL HYSTERECTOMY    . APPENDECTOMY    . CESAREAN SECTION    . CHOLECYSTECTOMY    . ERCP    . IR FLUORO GUIDE PORT INSERTION RIGHT  10/05/2017  . IR US GUIDE VASC ACCESS RIGHT  10/05/2017  . LIVER SURGERY    . PARTIAL MASTECTOMY WITH AXILLARY SENTINEL LYMPH NODE BIOPSY Left 07/31/2017   Procedure: LEFT PARTIAL MASTECTOMY WITH AXILLARY SENTINEL LYMPH NODE BIOPSY;  Surgeon: Virl Cagey, MD;  Location: [facility];  Service: General;  Laterality: Left;    I have reviewed the social history and family history with the patient and they are unchanged from previous note.  ALLERGIES:  is allergic to onion; penicillins; sulfa antibiotics; yellow dye; and clindamycin/lincomycin.  MEDICATIONS:  Current Outpatient Medications  Medication Sig Dispense Refill  . albuterol (PROVENTIL HFA;VENTOLIN HFA) 108 (90 Base) MCG/ACT inhaler Inhale 2 puffs into the lungs every 6 (six) hours as needed for wheezing or shortness of  breath.    . ALPRAZolam (XANAX) 1 MG tablet Take 1 tablet (1 mg total) by mouth daily as needed for anxiety. 30 tablet 0  . Bioflavonoid Products (BIOFLEX) TABS Take 1 tablet by mouth daily.    Marland Kitchen HYDROcodone-acetaminophen (NORCO) 5-325 MG tablet Take 1 tablet by mouth every 6 (six) hours as needed for moderate pain. 30 tablet 0  . Hypromellose (ARTIFICIAL TEARS OP) Apply to eye 2 (two) times daily. Doesn't do drops in eyes does as a wash to flush eyes out    . ibuprofen (ADVIL,MOTRIN) 800 MG tablet Take 800 mg by mouth every 8 (eight) hours as needed.    . lidocaine (LIDODERM) 5 % Place 0.5-1 patches onto the skin daily as needed (pain). Remove & Discard patch within 12 hours or as directed by MD    . lidocaine-prilocaine (EMLA) cream Apply 1 application topically as needed. 30 g 2   . Menthol, Topical Analgesic, (BIOFREEZE EX) Apply 1 application topically as needed (pain).    . mirtazapine (REMERON) 7.5 MG tablet Take 1 tablet (7.5 mg total) by mouth at bedtime. 30 tablet 0  . ondansetron (ZOFRAN) 8 MG tablet Take 1 tablet (8 mg total) by mouth 2 (two) times daily as needed. Start on the third day after chemotherapy. 30 tablet 3  . oxyCODONE (OXY IR/ROXICODONE) 5 MG immediate release tablet Take 5 mg by mouth every 4 (four) hours as needed for severe pain.    Marland Kitchen prochlorperazine (COMPAZINE) 10 MG tablet Take 1 tablet (10 mg total) by mouth every 6 (six) hours as needed for nausea or vomiting. 30 tablet 1   No current facility-administered medications for this visit.     PHYSICAL EXAMINATION: ECOG PERFORMANCE STATUS: 1 BP 128/79 (BP Location: Left Arm, Patient Position: Sitting)   Pulse 84   Temp 97.8 F (36.6 C) (Oral)   Resp 18   Ht '5\' 8"'  (1.727 m)   Wt 192 lb 11.2 oz (87.4 kg)   SpO2 97%   BMI 29.30 kg/m  GENERAL:alert, no distress and comfortable SKIN: skin color, texture, turgor are normal, no rashes or significant lesions EYES: normal, Conjunctiva are pink and non-injected, sclera clear OROPHARYNX:no exudate, no erythema and lips, buccal mucosa, and tongue normal  NECK: supple, thyroid normal size, non-tender, without nodularity LYMPH:  no palpable lymphadenopathy in the cervical, axillary or inguinal LUNGS: clear to auscultation and percussion with normal breathing effort HEART: regular rate & rhythm and no murmurs and no lower extremity edema ABDOMEN:abdomen soft, non-tender and normal bowel sounds. No hepatomegaly  Musculoskeletal:no cyanosis of digits and no clubbing  NEURO: alert & oriented x 3 with fluent speech, no focal motor/sensory deficits EXTREMITY: left upper extremity with 14 cm by 11 cm subcutaneous mass   LABORATORY DATA:  I have reviewed the data as listed CBC Latest Ref Rng & Units 11/09/2017 10/26/2017 10/12/2017  WBC 3.9 - 10.3 K/uL  7.2 5.4 6.2  Hemoglobin 11.6 - 15.9 g/dL 11.3(L) - -  Hematocrit 34.8 - 46.6 % 34.0(L) 35.6 40.0  Platelets 145 - 400 K/uL 264 246 295     CMP Latest Ref Rng & Units 11/09/2017 10/26/2017 10/12/2017  Glucose 70 - 140 mg/dL 109 91 90  BUN 7 - 26 mg/dL '9 7 14  ' Creatinine 0.60 - 1.10 mg/dL 0.76 0.75 0.77  Sodium 136 - 145 mmol/L 140 143 141  Potassium 3.5 - 5.1 mmol/L 3.7 4.1 3.6  Chloride 98 - 109 mmol/L 105 108 105  CO2 22 -  29 mmol/L '27 27 26  ' Calcium 8.4 - 10.4 mg/dL 8.9 8.8 9.7  Total Protein 6.4 - 8.3 g/dL 6.8 6.5 7.6  Total Bilirubin 0.2 - 1.2 mg/dL <0.2(L) <0.2(L) 0.3  Alkaline Phos 40 - 150 U/L 111 104 121  AST 5 - 34 U/L '19 17 21  ' ALT 0 - 55 U/L '22 17 28   ' PROCEDURE  Echo 10/08/17 Study Conclusions - Left ventricle: The cavity size was normal. There was moderate   concentric hypertrophy. Systolic function was normal. The   estimated ejection fraction was in the range of 55% to 60%. Wall   motion was normal; there were no regional wall motion   abnormalities. Doppler parameters are consistent with abnormal   left ventricular relaxation (grade 1 diastolic dysfunction).   There was no evidence of elevated ventricular filling pressure by   Doppler parameters. - Aortic valve: Trileaflet; normal thickness leaflets. There was no   regurgitation. - Aortic root: The aortic root was normal in size. - Mitral valve: There was no regurgitation. - Right ventricle: Systolic function was normal. - Right atrium: The atrium was normal in size. - Tricuspid valve: There was no regurgitation. - Pulmonic valve: There was no regurgitation. - Pulmonary arteries: Systolic pressure could not be accurately   estimated. - Inferior vena cava: The vessel was normal in size. - Pericardium, extracardiac: There was no pericardial effusion.  PATHOLOGY  Initial biopsy  Diagnosis 07/02/17 Breast, left, needle core biopsy, 2:00 - INVASIVE DUCTAL CARCINOMA. - SEE COMMENT. Microscopic Comment The  carcinoma appears grade III. A breast prognostic profile will be performed and the results reported separately. The results were called to [facility] [place] on 06/24/2017. (JBK:ecj 06/24/2017) Results: HER2 - NEGATIVE RATIO OF HER2/CEP17 SIGNALS 1.60 AVERAGE HER2 COPY NUMBER PER CELL 3.60 Results: IMMUNOHISTOCHEMICAL AND MORPHOMETRIC ANALYSIS PERFORMED MANUALLY Estrogen Receptor: 0%, NEGATIVE Progesterone Receptor: 0%, NEGATIVE Proliferation Marker Ki67: 90%  Surgical pathology  Diagnosis 07/31/18 1. Lymph node, sentinel, biopsy, left - ONE OF ONE LYMPH NODES NEGATIVE FOR CARCINOMA (0/1). 2. 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 - INVASIVE CARCINOMA IS <0.1 CM OF THE ANTERIOR MARGIN BROADLY. - SEE ONCOLOGY TABLE. 3. Breast, excision, left deep - BENIGN BREAST TISSUE. 4. Breast, excision, left superior - BENIGN BREAST TISSUE. 5. Breast, excision, left medial - BENIGN BREAST TISSUE. 6. Breast, excision, left inferior - BENIGN BREAST TISSUE. 7. Breast, excision, left lateral - BENIGN BREAST TISSUE. 8. Lymph node, sentinel, biopsy - ONE OF ONE LYMPH NODES NEGATIVE FOR CARCINOMA (0/1). 9. Lymph node, sentinel, biopsy - ONE OF ONE LYMPH NODES NEGATIVE FOR CARCINOMA (0/1). 10. Lymph node, sentinel, biopsy - ONE OF ONE LYMPH NODES NEGATIVE FOR CARCINOMA (0/1). 11. Lymph node, sentinel, biopsy - ONE OF ONE LYMPH NODES NEGATIVE FOR CARCINOMA (0/1). Microscopic Comment 2. BREAST, INVASIVE TUMOR Procedure: Left partial mastectomy with additional margins and left sentinel lymph node biopsies. Laterality: Left. 1 of 4 FINAL for Mary, Wise ([id number]) Microscopic Comment(continued) Tumor Size: 4.5 cm. Histologic Type: Invasive ductal carcinoma. Grade: 3 Tubular Differentiation: 2 Nuclear Pleomorphism: 3 Mitotic Count: 3 Ductal Carcinoma in Situ (DCIS): Not identified. Extent of Tumor: Confined to breast  parenchyma. Margins: Invasive carcinoma, distance from closest margin: <0.1 cm of anterior margin broadly. Remaining final margins (parts 3-7) are >1 cm. DCIS, distance from closest margin: N/A. Regional Lymph Nodes: Number of Lymph Nodes Examined: 5 Number of Sentinel Lymph Nodes Examined: 5 Lymph Nodes with Macrometastases: 0  Lymph Nodes with Micrometastases: 0 Lymph Nodes with Isolated Tumor Cells: 0 Breast Prognostic Profile: Performed on biopsy [id number], see below. Will not be repeated. Estrogen Receptor: Negative. Progesterone Receptor: Negative. Her2: Negative (ratio 1.60). Ki-67: 90%. Best tumor block for sendout testing: 2D Pathologic Stage Classification (pTNM, AJCC 8th Edition): Primary Tumor (pT): pT2 Regional Lymph Nodes (pN): pN0 Distant Metastases (pM): pMX Comments: None.  RADIOGRAPHIC STUDIES: I have personally reviewed the radiological images as listed and agreed with the findings in the report. No results found.   PET Scan 10/08/17 IMPRESSION: 1. No evidence breast cancer metastasis on FDG PET-CT scan. 2. Postsurgical change in the LEFT lateral breast and LEFT axilla.  Diagnostic Mammogram and [place] 06/16/17 IMPRESSION: Suspicious mass in the 230 o'clock location of the left breast. Tissue diagnosis is recommended. No evidence for adenopathy.  ASSESSMENT & PLAN:  Mary Wise 67 y.o. 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  1. 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  Proliferation marker Ki67 at 90% -I previously discussed her breast imaging and surgical pathology results with patient and her daughter in great detail. Her diagnostic mammogram from 06/16/17 revealed a suspicious mass in the 230 o'clock location of the left breast with her initial biopsy confirming invasive ductal carcinoma.  - Pt underwent a left partial  mastectomy with sentinel node biopsy with Dr. Constance Haw on 07/31/17. Surgical pathology results revealed invasive ductal carcinoma, grade III, spanning 4.5 cm. 5 lymph nodes biopsied were negative.  -Given her strong family history of cancer, we recommend her to undergo genetic testing to ruled out inheritable breast cancer. She agreed  - She is at high risk for recurrence due to her Stage II triple negative disease which is more aggressive than ER PR positive disease. Her estimated risk for distant recurrence is about 30-35% -I strongly recommended her to consider adjuvant chemotherapy to reduce her risk of recurrence. I recommend 2 months of adriamycin and cytoxan followed by bi-weekly taxol for 2 months. She started chemo AC on 10/12/17. -the goal of therapy is curative -PET from 10/08/17 showed no evidence of metastasis and her ECHO revealed normal heart function, discussed the results with patient. Will order another ECHO once she completes chemo. I previously discussed results with pt.  -She has started adjuvant chemotherapy Adriamycin and Cytoxan, tolerating well overall.  She does have significant fatigue after chemo, but able to recover well after a few days.  She has also slightly worsening has been taking pain medication.  -Labs reviewed, adequate for Cycle 3 AC and Onpro  -After she completes 4 cycles of AC, plan to start Taxol every 2 weeks for 4 cycles -F/u in 2 weeks, she knows to call with any questions or concerns.   2. Anxiety -She takes Xanax PRN but has found herself using it more often since her Breast Cancer diagnosis. -I refilled for her on 09/30/17  3. Large left upper arm lipoma  -She has a large lipoma in the upper arm/should area, she was seen by Dr. Sherlynn Stalls at [facility].  -Dr. Sherlynn Stalls plan to have surgical resection after she completes chemo, and he will coordinate with her radiation with Dr. Isidore Moos.  4. Genetics -She has strong family history of breast, ovarian cancer and brain  tumor, I strongly recommend her to have genetic testing to rule out inheritable genetic syndrome.  She agrees, I referred her -Appointment  is on 11/09/17  5. Insomnia  -She has chronic insomnia, tried trazadone, melatonin, and benadryl in the past. She was historically on [place] x30 years but a previous provider took her off it. She gets approx 2 hours of sleep with xanax. Regan Rakers reviewed sleep hygiene with her and prescribed remeron on 10/26/17.   6. Nausea, constipation/diarrhea, secondary to chemotherapy -She had mild nausea without emesis days 2-5 after cycle 1 AC. She began alternating compazine and zofran on day 3 which helped. She was encouraged  to continue this regimen. Antiemetic dosing instructions reviewed previously  -for chronic constipation, she takes 2 senokot daily and 1 laxative weekly PRN. She experienced alternating constipation and diarrhea after treatment. Lacie recommend her to take 1 senokot daily or QOD, laxative only as needed. If she experiences diarrhea she will stop meds altogether.  -She was previously encouraged to have imodium on hand for diarrhea.   7. Knee pain and right ankle pain -She states she has been taking hydrocodone occasionally (she gets from ED visits) for 10 years for arthritis. She uses it as needed.  -She has new onset right ankle pain for a fall that she has been taking pain medication Norco 1-2 tabs/day for the past 5 days. She also reports that the chemo has been making her arthrtis and body aches worse.  -pt is requesting a refill of her norco. I advised her that I would refill for her 1 more time and that she should only use this medication as needed and no more than 2 per day. She voiced good understanding.  We discussed narcotics addiction, and I discouraged her to use it for long-term. -Has tramadol at home, I encouraged her to use as needed for pain also.   Plan -Labs reviewed, proceed with cycle 3 AC with Onpro today, continue q2 weeks for  total 4 cycles -refilled norco (no more routine refill)  -Lab and f/u for last cycle AC in 2 weeks     No orders of the defined types were placed in this encounter.  All questions were answered. The patient knows to call the clinic with any problems, questions or concerns. No barriers to learning was detected.  I spent 20 minutes counseling the patient face to face. The total time spent in the appointment was 25 minutes and more than 50% was on counseling and review of test results  This document serves as a record of services personally performed by Truitt Merle, MD. It was created on her behalf by Theresia Bough, a trained medical scribe. The creation of this record is based on the scribe's personal observations and the provider's statements to them.   I have reviewed the above documentation for accuracy and completeness, and I agree with the above.     Truitt Merle, MD 11/09/17 12:30 PM

## 2017-11-09 ENCOUNTER — Encounter: Payer: Self-pay | Admitting: Hematology

## 2017-11-09 ENCOUNTER — Inpatient Hospital Stay: Payer: Medicare Other

## 2017-11-09 ENCOUNTER — Telehealth: Payer: Self-pay | Admitting: Genetics

## 2017-11-09 ENCOUNTER — Inpatient Hospital Stay: Payer: Medicare Other | Attending: Hematology | Admitting: Hematology

## 2017-11-09 ENCOUNTER — Inpatient Hospital Stay: Payer: Medicare Other | Admitting: Genetics

## 2017-11-09 VITALS — BP 128/79 | HR 84 | Temp 97.8°F | Resp 18 | Ht 68.0 in | Wt 192.7 lb

## 2017-11-09 DIAGNOSIS — C50412 Malignant neoplasm of upper-outer quadrant of left female breast: Secondary | ICD-10-CM | POA: Insufficient documentation

## 2017-11-09 DIAGNOSIS — Z5111 Encounter for antineoplastic chemotherapy: Secondary | ICD-10-CM | POA: Diagnosis not present

## 2017-11-09 DIAGNOSIS — R110 Nausea: Secondary | ICD-10-CM | POA: Diagnosis not present

## 2017-11-09 DIAGNOSIS — Z171 Estrogen receptor negative status [ER-]: Principal | ICD-10-CM

## 2017-11-09 DIAGNOSIS — F419 Anxiety disorder, unspecified: Secondary | ICD-10-CM | POA: Diagnosis not present

## 2017-11-09 DIAGNOSIS — I10 Essential (primary) hypertension: Secondary | ICD-10-CM

## 2017-11-09 DIAGNOSIS — Z95828 Presence of other vascular implants and grafts: Secondary | ICD-10-CM

## 2017-11-09 LAB — CBC WITH DIFFERENTIAL/PLATELET
BASOS PCT: 1 %
Basophils Absolute: 0.1 10*3/uL (ref 0.0–0.1)
Eosinophils Absolute: 0.1 10*3/uL (ref 0.0–0.5)
Eosinophils Relative: 1 %
HEMATOCRIT: 34 % — AB (ref 34.8–46.6)
Hemoglobin: 11.3 g/dL — ABNORMAL LOW (ref 11.6–15.9)
Lymphocytes Relative: 18 %
Lymphs Abs: 1.3 10*3/uL (ref 0.9–3.3)
MCH: 29.7 pg (ref 25.1–34.0)
MCHC: 33.4 g/dL (ref 31.5–36.0)
MCV: 88.8 fL (ref 79.5–101.0)
MONO ABS: 0.7 10*3/uL (ref 0.1–0.9)
MONOS PCT: 10 %
Neutro Abs: 5 10*3/uL (ref 1.5–6.5)
Neutrophils Relative %: 70 %
Platelets: 264 10*3/uL (ref 145–400)
RBC: 3.83 MIL/uL (ref 3.70–5.45)
RDW: 13.6 % (ref 11.2–14.5)
WBC: 7.2 10*3/uL (ref 3.9–10.3)

## 2017-11-09 LAB — COMPREHENSIVE METABOLIC PANEL
ALBUMIN: 3.5 g/dL (ref 3.5–5.0)
ALK PHOS: 111 U/L (ref 40–150)
ALT: 22 U/L (ref 0–55)
AST: 19 U/L (ref 5–34)
Anion gap: 8 (ref 3–11)
BUN: 9 mg/dL (ref 7–26)
CALCIUM: 8.9 mg/dL (ref 8.4–10.4)
CO2: 27 mmol/L (ref 22–29)
CREATININE: 0.76 mg/dL (ref 0.60–1.10)
Chloride: 105 mmol/L (ref 98–109)
GFR calc Af Amer: >60 mL/min (ref >60)
GFR calc non Af Amer: >60 mL/min (ref >60)
GLUCOSE: 109 mg/dL (ref 70–140)
Potassium: 3.7 mmol/L (ref 3.5–5.1)
SODIUM: 140 mmol/L (ref 136–145)
Total Bilirubin: <0.2 mg/dL — ABNORMAL LOW (ref 0.2–1.2)
Total Protein: 6.8 g/dL (ref 6.4–8.3)

## 2017-11-09 MED ORDER — SODIUM CHLORIDE 0.9% FLUSH
10.0000 mL | Freq: Once | INTRAVENOUS | Status: AC
  Administered 2017-11-09: 10 mL
  Filled 2017-11-09: qty 10

## 2017-11-09 MED ORDER — PALONOSETRON HCL INJECTION 0.25 MG/5ML
INTRAVENOUS | Status: AC
  Filled 2017-11-09: qty 5

## 2017-11-09 MED ORDER — HEPARIN SOD (PORK) LOCK FLUSH 100 UNIT/ML IV SOLN
500.0000 [IU] | Freq: Once | INTRAVENOUS | Status: AC | PRN
  Administered 2017-11-09: 500 [IU]
  Filled 2017-11-09: qty 5

## 2017-11-09 MED ORDER — SODIUM CHLORIDE 0.9 % IV SOLN
Freq: Once | INTRAVENOUS | Status: AC
  Administered 2017-11-09: 13:00:00 via INTRAVENOUS

## 2017-11-09 MED ORDER — ONDANSETRON HCL 8 MG PO TABS: 8.0000 mg | ORAL_TABLET | Freq: Two times a day (BID) | ORAL | 3 refills | Status: DC | PRN

## 2017-11-09 MED ORDER — PEGFILGRASTIM 6 MG/0.6ML ~~LOC~~ PSKT
PREFILLED_SYRINGE | SUBCUTANEOUS | Status: AC
  Filled 2017-11-09: qty 0.6

## 2017-11-09 MED ORDER — SODIUM CHLORIDE 0.9 % IV SOLN
Freq: Once | INTRAVENOUS | Status: AC
  Administered 2017-11-09: 14:00:00 via INTRAVENOUS
  Filled 2017-11-09: qty 5

## 2017-11-09 MED ORDER — DOXORUBICIN HCL CHEMO IV INJECTION 2 MG/ML
60.0000 mg/m2 | Freq: Once | INTRAVENOUS | Status: AC
  Administered 2017-11-09: 124 mg via INTRAVENOUS
  Filled 2017-11-09: qty 62

## 2017-11-09 MED ORDER — SODIUM CHLORIDE 0.9 % IV SOLN
600.0000 mg/m2 | Freq: Once | INTRAVENOUS | Status: AC
  Administered 2017-11-09: 1240 mg via INTRAVENOUS
  Filled 2017-11-09: qty 62

## 2017-11-09 MED ORDER — SODIUM CHLORIDE 0.9% FLUSH
10.0000 mL | INTRAVENOUS | Status: DC | PRN
  Administered 2017-11-09: 10 mL
  Filled 2017-11-09: qty 10

## 2017-11-09 MED ORDER — PEGFILGRASTIM 6 MG/0.6ML ~~LOC~~ PSKT
6.0000 mg | PREFILLED_SYRINGE | Freq: Once | SUBCUTANEOUS | Status: AC
Start: 2017-11-09 — End: 2017-11-09
  Administered 2017-11-09: 6 mg via SUBCUTANEOUS

## 2017-11-09 MED ORDER — PALONOSETRON HCL INJECTION 0.25 MG/5ML
0.2500 mg | Freq: Once | INTRAVENOUS | Status: AC
  Administered 2017-11-09: 0.25 mg via INTRAVENOUS

## 2017-11-09 MED ORDER — HYDROCODONE-ACETAMINOPHEN 5-325 MG PO TABS: 1.0000 | ORAL_TABLET | Freq: Four times a day (QID) | ORAL | 0 refills | Status: DC | PRN

## 2017-11-09 NOTE — Telephone Encounter (Signed)
Informed patient we had her scheduled for genetics appointment 4/8 at 4pm.  However, she left the cancer center before going to this appointment. Called to offer to reschedule for her.

## 2017-11-09 NOTE — Patient Instructions (Signed)
[facility] Discharge Instructions for Patients Receiving Chemotherapy  Today you received the following chemotherapy agents Adriamycin and Cytoxan  To help prevent nausea and vomiting after your treatment, we encourage you to take your nausea medication as directed.  If you develop nausea and vomiting that is not controlled by your nausea medication, call the clinic.   BELOW ARE SYMPTOMS THAT SHOULD BE REPORTED IMMEDIATELY:  *FEVER GREATER THAN 100.5 F  *CHILLS WITH OR WITHOUT FEVER  NAUSEA AND VOMITING THAT IS NOT CONTROLLED WITH YOUR NAUSEA MEDICATION  *UNUSUAL SHORTNESS OF BREATH  *UNUSUAL BRUISING OR BLEEDING  TENDERNESS IN MOUTH AND THROAT WITH OR WITHOUT PRESENCE OF ULCERS  *URINARY PROBLEMS  *BOWEL PROBLEMS  UNUSUAL RASH Items with * indicate a potential emergency and should be followed up as soon as possible.  Feel free to call the clinic should you have any questions or concerns. The clinic phone number is [phone].  Please show the CHEMO ALERT CARD at check-in to the Emergency Department and triage nurse.   

## 2017-11-12 ENCOUNTER — Telehealth: Payer: Self-pay | Admitting: Genetics

## 2017-11-12 NOTE — Telephone Encounter (Signed)
Patient returned call saying she was forgot about genetics appointment and was not feeling well after her infusion.  She is interested in rescheduling.  She says next time she is scheduling future appointments she will talk to the scheduler about scheduling genetics.

## 2017-11-17 ENCOUNTER — Other Ambulatory Visit (HOSPITAL_COMMUNITY): Payer: Medicare Other

## 2017-11-17 ENCOUNTER — Ambulatory Visit (HOSPITAL_COMMUNITY): Payer: Medicare Other | Admitting: Hematology

## 2017-11-20 NOTE — Progress Notes (Signed)
[place]  [phone] [phone]  Clinic Follow up Note   Patient Care Team: [name], MD as PCP - General (Family Medicine) 11/23/2017   CHIEF COMPLAINT: F/u triple negative breast cancer   SUMMARY OF ONCOLOGIC HISTORY: Oncology History   Cancer Staging Malignant neoplasm of upper-outer quadrant of left breast in female, estrogen receptor negative ([place]) Staging form: Breast, AJCC 8th Edition - Pathologic stage from 08/19/2017: Stage IIA (pT2, pN0(sn), cM0, G3, ER: Negative, PR: Negative, HER2: Negative) - Signed by Ardath Sax, MD on 09/01/2017       Malignant neoplasm of upper-outer quadrant of left breast in female, estrogen receptor negative ([place])   06/16/2017 Initial Diagnosis    Malignant neoplasm of upper-outer quadrant of left breast in female: Initially presented with left deltoid mass which was consistent with lipoma.  During examination, a mass in the left breast was appreciated by palpation.      06/16/2017 Mammogram    BL Diagnostic Study: Irregular hyperdense mass in the upper outer quadrant of the left breast measuring 3.6 cm without radiographically detectable lymphadenopathy.  Normal appearance of the right breast.      06/16/2017 Breast US    3.2 cm irregular mass located at 2:30 o'clock, no pathological lymphadenopathy.      06/23/2017 Pathology Results    Ultrasound-guided biopsy: Positive for invasive ductal carcinoma, grade 3, ER 0%, PR 0%, HER-2 negative by [facility]      07/31/2017 Surgery    Partial left mastectomy with sentinel lymph node biopsy --Pathology: Invasive ductal carcinoma, measuring 4.5 cm, grade 3.  All margins negative, but the anterior margin less than 0.1 cm.  No surrounding DCIS.  0/5 lymph nodes positive for malignancy      10/08/2017 PET scan    IMPRESSION: 1. No evidence breast cancer metastasis on FDG PET-CT scan. 2. Postsurgical change in the LEFT lateral breast and LEFT axilla.        HISTORY OF PRESENT ILLNESS: Mary Wise 67 y.o. female presents to the [facility] for follow up her left breast cancer that has recently been surgically resected. The patient was referred by Dr. Isidore Moos.  She was previously seen by my partner Dr. Lebron Conners at [facility].  Patient states she noticed a lump in her left breast after she had knocked into her bed and felt pain in the area. 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 She saw her PCP and during the evaluation, a mass was discovered in the lateral aspect of the left breast leading to additional evaluation. She had been getting regular mammograms yearly before this with one abnormal result in the past that revealed fatty tissue.  Her diagnostic mammogram from 06/16/17 revealed a suspicious mass in the 230 o'clock location of the left breast with her initial biopsy confirming invasive ductal carcinoma. Prognostic indicators significant for: ER, 0% negative and PR, 0% negative. Proliferation marker Ki67 at 90%. HER2 negative. Pt underwent a left partial mastectomy with sentinel node biopsy with Dr. Constance Haw on 07/31/17. Surgical pathology results revealed invasive ductal carcinoma and DCIS, grade III, spanning 4.5 cm. Lymph nodes biopsied were negative.  She met Dr. Lebron Conners one month ago, adjuvant chemotherapy was recommended, but she declined.  She was seen by radiation oncologist Dr. Isidore Moos yesterday, who encouraged her to consider adjuvant chemotherapy, and referred her to me.  In the past she was diagnosed with  medical conditions of anxiety, asthma, seizure and migraines. She has had gallstones that traveled to liver that required surgery, appendectomy, and complete hysterectomy.   She has a FHx of breast cancer, brain cancer, ovarian cancer, throat cancer, and lung cancer. She has a Hx of tobacco use for 9 years over 30 years ago. She  GYN HISTORY  Menarchal:  12 LMP: 40, complete hysterectomy  Contraceptive: HRT:  GP: 1 daughter, Joelene Millin, she works in the ED at [employer] she is divorced and lives by herself in an assisted living facility in [place]. She states she is able to do everything for herself. She enjoys the company of people and she is able to get meals there.   CURRENT THERAPY: Adjuvant chemotherapy AC every 2 weeks for 4 cycles, followed by bi-weekly Taxol for 4 cycles starting on 10/12/17. AC completed on 11/23/17.   INTERVAL HISTORY: Mary Wise is here for follow up and Cycle 4 AC. She presents to the clinic today accompanied by her friend. She reports she is doing well overall. She states she is tolerating chemo moderately well. She notes she has not had too much trouble with nausea. She reports she has been exercising a little bit. She endorses a good appetite and regular bowel movements.  On review of systems, pt denies fever, abnormal bleeding, or any other complaints at this time. Pertinent positives are listed and detailed within the above HPI.   REVIEW OF SYSTEMS:   Constitutional: Denies fevers, chills or abnormal weight loss (+) fatigue (+)good appetite  Eyes: Denies blurriness of vision Ears, nose, mouth, throat, and face: Denies mucositis or sore throat Respiratory: Denies cough, dyspnea or wheezes Cardiovascular: Denies palpitation, chest discomfort or lower extremity swelling Gastrointestinal:  Denies heartburn or change in bowel habits (+) nausea, tolerable  Skin: Denies abnormal skin rashes Lymphatics: Denies new lymphadenopathy or easy bruising Neurological:Denies numbness, tingling or new weaknesses Behavioral/Psych:  (+) anxiety Extremity: (+) lipoma of the upper left arm  All other systems were reviewed with the patient and are negative.  MEDICAL HISTORY:  Past Medical History:  Diagnosis Date  . Anxiety   . Asthma   . Cancer [facility])    left breast cancer  . Hemorrhoids   . Lipoma of left  shoulder   . Migraine   . Migraine   . 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 ABDOMINAL HYSTERECTOMY    . APPENDECTOMY    . CESAREAN SECTION    . CHOLECYSTECTOMY    . ERCP    . IR FLUORO GUIDE PORT INSERTION RIGHT  10/05/2017  . IR US GUIDE VASC ACCESS RIGHT  10/05/2017  . LIVER SURGERY    . PARTIAL MASTECTOMY WITH AXILLARY SENTINEL LYMPH NODE BIOPSY Left 07/31/2017   Procedure: LEFT PARTIAL MASTECTOMY WITH AXILLARY SENTINEL LYMPH NODE BIOPSY;  Surgeon: Virl Cagey, MD;  Location: [facility];  Service: General;  Laterality: Left;    I have reviewed the social history and family history with the patient and they are unchanged from previous note.  ALLERGIES:  is allergic to onion; penicillins; sulfa antibiotics; yellow dye; and clindamycin/lincomycin.  MEDICATIONS:  Current Outpatient Medications  Medication Sig Dispense Refill  . albuterol (PROVENTIL HFA;VENTOLIN HFA) 108 (90 Base) MCG/ACT inhaler Inhale 2 puffs into the lungs every 6 (six) hours as needed for wheezing or shortness of breath.    . ALPRAZolam Duanne Moron)  1 MG tablet Take 1 tablet (1 mg total) by mouth daily as needed for anxiety. 30 tablet 0  . Bioflavonoid Products (BIOFLEX) TABS Take 1 tablet by mouth daily.    Marland Kitchen HYDROcodone-acetaminophen (NORCO) 5-325 MG tablet Take 1 tablet by mouth every 6 (six) hours as needed for moderate pain. 30 tablet 0  . Hypromellose (ARTIFICIAL TEARS OP) Apply to eye 2 (two) times daily. Doesn't do drops in eyes does as a wash to flush eyes out    . ibuprofen (ADVIL,MOTRIN) 800 MG tablet Take 800 mg by mouth every 8 (eight) hours as needed.    . lidocaine (LIDODERM) 5 % Place 0.5-1 patches onto the skin daily as needed (pain). Remove & Discard patch within 12 hours or as directed by MD    . lidocaine-prilocaine (EMLA) cream Apply 1 application topically as needed. 30 g 2  . Menthol, Topical Analgesic,  (BIOFREEZE EX) Apply 1 application topically as needed (pain).    . mirtazapine (REMERON) 7.5 MG tablet Take 1 tablet (7.5 mg total) by mouth at bedtime. 30 tablet 0  . ondansetron (ZOFRAN) 8 MG tablet Take 1 tablet (8 mg total) by mouth 2 (two) times daily as needed. Start on the third day after chemotherapy. 30 tablet 3  . oxyCODONE (OXY IR/ROXICODONE) 5 MG immediate release tablet Take 5 mg by mouth every 4 (four) hours as needed for severe pain.    Marland Kitchen prochlorperazine (COMPAZINE) 10 MG tablet Take 1 tablet (10 mg total) by mouth every 6 (six) hours as needed for nausea or vomiting. 30 tablet 2   No current facility-administered medications for this visit.     PHYSICAL EXAMINATION: ECOG PERFORMANCE STATUS: 1 BP 133/75 (BP Location: Right Arm, Patient Position: Sitting)   Pulse 88   Temp 97.7 F (36.5 C) (Oral)   Resp 18   Ht '5\' 8"'  (1.727 m)   Wt 189 lb 6.4 oz (85.9 kg)   SpO2 99%   BMI 28.80 kg/m  GENERAL:alert, no distress and comfortable SKIN: skin color, texture, turgor are normal, no rashes or significant lesions EYES: normal, Conjunctiva are pink and non-injected, sclera clear OROPHARYNX:no exudate, no erythema and lips, buccal mucosa, and tongue normal  NECK: supple, thyroid normal size, non-tender, without nodularity LYMPH:  no palpable lymphadenopathy in the cervical, axillary or inguinal LUNGS: clear to auscultation and percussion with normal breathing effort HEART: regular rate & rhythm and no murmurs and no lower extremity edema ABDOMEN:abdomen soft, non-tender and normal bowel sounds. No hepatomegaly  Musculoskeletal:no cyanosis of digits and no clubbing  NEURO: alert & oriented x 3 with fluent speech, no focal motor/sensory deficits EXTREMITY: left upper extremity with 14 cm by 11 cm subcutaneous mass   LABORATORY DATA:  I have reviewed the data as listed CBC Latest Ref Rng & Units 11/23/2017 11/09/2017 10/26/2017  WBC 3.9 - 10.3 K/uL 5.7 7.2 5.4  Hemoglobin 11.6 -  15.9 g/dL 10.7(L) 11.3(L) 11.7  Hematocrit 34.8 - 46.6 % 31.9(L) 34.0(L) 35.6  Platelets 145 - 400 K/uL 288 264 246     CMP Latest Ref Rng & Units 11/23/2017 11/09/2017 10/26/2017  Glucose 70 - 140 mg/dL 103 109 91  BUN 7 - 26 mg/dL '9 9 7  ' Creatinine 0.60 - 1.10 mg/dL 0.72 0.76 0.75  Sodium 136 - 145 mmol/L 140 140 143  Potassium 3.5 - 5.1 mmol/L 3.8 3.7 4.1  Chloride 98 - 109 mmol/L 106 105 108  CO2 22 - 29 mmol/L '24 27 27  ' Calcium  8.4 - 10.4 mg/dL 9.0 8.9 8.8  Total Protein 6.4 - 8.3 g/dL 6.9 6.8 6.5  Total Bilirubin 0.2 - 1.2 mg/dL <0.2(L) <0.2(L) <0.2(L)  Alkaline Phos 40 - 150 U/L 106 111 104  AST 5 - 34 U/L '24 19 17  ' ALT 0 - 55 U/L '25 22 17   ' PROCEDURE  Echo 10/08/17 Study Conclusions - Left ventricle: The cavity size was normal. There was moderate   concentric hypertrophy. Systolic function was normal. The   estimated ejection fraction was in the range of 55% to 60%. Wall   motion was normal; there were no regional wall motion   abnormalities. Doppler parameters are consistent with abnormal   left ventricular relaxation (grade 1 diastolic dysfunction).   There was no evidence of elevated ventricular filling pressure by   Doppler parameters. - Aortic valve: Trileaflet; normal thickness leaflets. There was no   regurgitation. - Aortic root: The aortic root was normal in size. - Mitral valve: There was no regurgitation. - Right ventricle: Systolic function was normal. - Right atrium: The atrium was normal in size. - Tricuspid valve: There was no regurgitation. - Pulmonic valve: There was no regurgitation. - Pulmonary arteries: Systolic pressure could not be accurately   estimated. - Inferior vena cava: The vessel was normal in size. - Pericardium, extracardiac: There was no pericardial effusion.  PATHOLOGY  Initial biopsy  Diagnosis 07/02/17 Breast, left, needle core biopsy, 2:00 - INVASIVE DUCTAL CARCINOMA. - SEE COMMENT. Microscopic Comment The carcinoma appears  grade III. A breast prognostic profile will be performed and the results reported separately. The results were called to [facility] on 06/24/2017. (JBK:ecj 06/24/2017) Results: HER2 - NEGATIVE RATIO OF HER2/CEP17 SIGNALS 1.60 AVERAGE HER2 COPY NUMBER PER CELL 3.60 Results: IMMUNOHISTOCHEMICAL AND MORPHOMETRIC ANALYSIS PERFORMED MANUALLY Estrogen Receptor: 0%, NEGATIVE Progesterone Receptor: 0%, NEGATIVE Proliferation Marker Ki67: 90%  Surgical pathology  Diagnosis 07/31/18 1. Lymph node, sentinel, biopsy, left - ONE OF ONE LYMPH NODES NEGATIVE FOR CARCINOMA (0/1). 2. 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 - INVASIVE CARCINOMA IS <0.1 CM OF THE ANTERIOR MARGIN BROADLY. - SEE ONCOLOGY TABLE. 3. Breast, excision, left deep - BENIGN BREAST TISSUE. 4. Breast, excision, left superior - BENIGN BREAST TISSUE. 5. Breast, excision, left medial - BENIGN BREAST TISSUE. 6. Breast, excision, left inferior - BENIGN BREAST TISSUE. 7. Breast, excision, left lateral - BENIGN BREAST TISSUE. 8. Lymph node, sentinel, biopsy - ONE OF ONE LYMPH NODES NEGATIVE FOR CARCINOMA (0/1). 9. Lymph node, sentinel, biopsy - ONE OF ONE LYMPH NODES NEGATIVE FOR CARCINOMA (0/1). 10. Lymph node, sentinel, biopsy - ONE OF ONE LYMPH NODES NEGATIVE FOR CARCINOMA (0/1). 11. Lymph node, sentinel, biopsy - ONE OF ONE LYMPH NODES NEGATIVE FOR CARCINOMA (0/1). Microscopic Comment 2. BREAST, INVASIVE TUMOR Procedure: Left partial mastectomy with additional margins and left sentinel lymph node biopsies. Laterality: Left. 1 of 4 FINAL for TEMECA, SOMMA ([id number]) Microscopic Comment(continued) Tumor Size: 4.5 cm. Histologic Type: Invasive ductal carcinoma. Grade: 3 Tubular Differentiation: 2 Nuclear Pleomorphism: 3 Mitotic Count: 3 Ductal Carcinoma in Situ (DCIS): Not identified. Extent of Tumor: Confined to breast  parenchyma. Margins: Invasive carcinoma, distance from closest margin: <0.1 cm of anterior margin broadly. Remaining final margins (parts 3-7) are >1 cm. DCIS, distance from closest margin: N/A. Regional Lymph Nodes: Number of Lymph Nodes Examined: 5 Number of Sentinel Lymph Nodes Examined: 5 Lymph Nodes with Macrometastases: 0 Lymph Nodes with Micrometastases: 0 Lymph Nodes  with Isolated Tumor Cells: 0 Breast Prognostic Profile: Performed on biopsy [id number], see below. Will not be repeated. Estrogen Receptor: Negative. Progesterone Receptor: Negative. Her2: Negative (ratio 1.60). Ki-67: 90%. Best tumor block for sendout testing: 2D Pathologic Stage Classification (pTNM, AJCC 8th Edition): Primary Tumor (pT): pT2 Regional Lymph Nodes (pN): pN0 Distant Metastases (pM): pMX Comments: None.  RADIOGRAPHIC STUDIES: I have personally reviewed the radiological images as listed and agreed with the findings in the report. No results found.   PET Scan 10/08/17 IMPRESSION: 1. No evidence breast cancer metastasis on FDG PET-CT scan. 2. Postsurgical change in the LEFT lateral breast and LEFT axilla.  Diagnostic Mammogram and [place] 06/16/17 IMPRESSION: Suspicious mass in the 230 o'clock location of the left breast. Tissue diagnosis is recommended. No evidence for adenopathy.  ASSESSMENT & PLAN:  Mary Wise 67 y.o. 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  1. 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  Proliferation marker Ki67 at 90% -I previously discussed her breast imaging and surgical pathology results with patient and her daughter in great detail. Her diagnostic mammogram from 06/16/17 revealed a suspicious mass in the 230 o'clock location of the left breast with her initial biopsy confirming invasive ductal carcinoma.  - Pt underwent a left partial  mastectomy with sentinel node biopsy with Dr. Constance Haw on 07/31/17. Surgical pathology results revealed invasive ductal carcinoma, grade III, spanning 4.5 cm. 5 lymph nodes biopsied were negative.  -Given her strong family history of cancer, we recommend her to undergo genetic testing to ruled out inheritable breast cancer. She agreed  - She is at high risk for recurrence due to her Stage II triple negative disease which is more aggressive than ER PR positive disease. Her estimated risk for distant recurrence is about 30-35% -I strongly recommended her to consider adjuvant chemotherapy to reduce her risk of recurrence. I recommend 2 months of dose dense adriamycin and cytoxan followed by bi-weekly taxol for 2 months. She started chemo AC on 10/12/17. -the goal of therapy is curative -PET from 10/08/17 showed no evidence of metastasis and her ECHO revealed normal heart function, discussed the results with patient. Will order another ECHO once she completes chemo. I previously discussed results with pt.  -She started adjuvant chemotherapy Adriamycin and Cytoxan on 10/12/17, tolerating well overall.  She does have significant fatigue after chemo, but able to recover well after a few days.   -Labs reviewed, she has mild anemia, adequate for Cycle 4 AC. Plan to start Taxol in 2 weeks for every 2 weeks for 4 cycles  -F/u in 2 weeks, she knows to call with any questions or concerns.   2. Anxiety -She takes Xanax PRN but has found herself using it more often since her Breast Cancer diagnosis. -I refilled for her on 09/30/17  3. Large left upper arm lipoma  -She has a large lipoma in the upper arm/should area, she was seen by Dr. Sherlynn Stalls at [facility].  -Dr. Sherlynn Stalls plan to have surgical resection after she completes chemo, and he will coordinate with her radiation with Dr. Isidore Moos.  4. Genetics -She has strong family history of breast, ovarian cancer and brain tumor, I strongly recommend her to have genetic testing to  rule out inheritable genetic syndrome.  She agrees, I referred her -Appointment was rescheduled  5. Insomnia  -She has chronic insomnia, tried trazadone, melatonin, and benadryl  in the past. She was historically on [place] x30 years but a previous provider took her off it. She gets approx 2 hours of sleep with xanax. Regan Rakers reviewed sleep hygiene with her and prescribed remeron on 10/26/17.   6. Nausea, constipation/diarrhea, secondary to chemotherapy -She had mild nausea without emesis days 2-5 after cycle 1 AC. She began alternating compazine and zofran on day 3 which helped. She was encouraged  to continue this regimen. Antiemetic dosing instructions reviewed previously  -for chronic constipation, she takes 2 senokot daily and 1 laxative weekly PRN. She experienced alternating constipation and diarrhea after treatment. Lacie recommend her to take 1 senokot daily or QOD, laxative only as needed. If she experiences diarrhea she will stop meds altogether.  -She was previously encouraged to have imodium on hand for diarrhea.   7. Knee pain and right ankle pain -She states she has been taking hydrocodone occasionally (she gets from ED visits) for 10 years for arthritis. She uses it as needed.  -She has new onset right ankle pain for a fall that she has been taking pain medication Norco 1-2 tabs/day for the past 5 days. She also reports that the chemo has been making her arthrtis and body aches worse.  -pt requested refill of her norco on 11/09/2017. I advised her that I would refill for her 1 more time and that she should only use this medication as needed and no more than 2 per day. She voiced good understanding. We discussed narcotic addiction, and I discouraged her to use it for long-term. -Has tramadol at home, I encouraged her to use as needed for pain also.   Plan -Labs reviewed, proceed with cycle 4 AC today -Return in 2 weeks for 1st treatment Taxol and f/u, and continue q2 weeks for total 4  cycles if she tolerates  -refilled compazine  No orders of the defined types were placed in this encounter.  All questions were answered. The patient knows to call the clinic with any problems, questions or concerns. No barriers to learning was detected.  I spent 20 minutes counseling the patient face to face. The total time spent in the appointment was 25 minutes and more than 50% was on counseling and review of test results  This document serves as a record of services personally performed by Truitt Merle, MD. It was created on her behalf by Theresia Bough, a trained medical scribe. The creation of this record is based on the scribe's personal observations and the provider's statements to them.   I have reviewed the above documentation for accuracy and completeness, and I agree with the above.    Truitt Merle, MD 11/23/17

## 2017-11-23 ENCOUNTER — Inpatient Hospital Stay: Payer: Medicare Other

## 2017-11-23 ENCOUNTER — Telehealth: Payer: Self-pay | Admitting: Hematology

## 2017-11-23 ENCOUNTER — Encounter: Payer: Self-pay | Admitting: Hematology

## 2017-11-23 ENCOUNTER — Inpatient Hospital Stay (HOSPITAL_BASED_OUTPATIENT_CLINIC_OR_DEPARTMENT_OTHER): Payer: Medicare Other | Admitting: Hematology

## 2017-11-23 VITALS — BP 133/75 | HR 88 | Temp 97.7°F | Resp 18 | Ht 68.0 in | Wt 189.4 lb

## 2017-11-23 DIAGNOSIS — F419 Anxiety disorder, unspecified: Secondary | ICD-10-CM | POA: Diagnosis not present

## 2017-11-23 DIAGNOSIS — Z5111 Encounter for antineoplastic chemotherapy: Secondary | ICD-10-CM | POA: Diagnosis not present

## 2017-11-23 DIAGNOSIS — C50412 Malignant neoplasm of upper-outer quadrant of left female breast: Secondary | ICD-10-CM

## 2017-11-23 DIAGNOSIS — Z171 Estrogen receptor negative status [ER-]: Principal | ICD-10-CM

## 2017-11-23 LAB — CMP (CANCER CENTER ONLY)
ALT: 25 U/L (ref 0–55)
ANION GAP: 10 (ref 3–11)
AST: 24 U/L (ref 5–34)
Albumin: 3.7 g/dL (ref 3.5–5.0)
Alkaline Phosphatase: 106 U/L (ref 40–150)
BUN: 9 mg/dL (ref 7–26)
CO2: 24 mmol/L (ref 22–29)
CREATININE: 0.72 mg/dL (ref 0.60–1.10)
Calcium: 9 mg/dL (ref 8.4–10.4)
Chloride: 106 mmol/L (ref 98–109)
Glucose, Bld: 103 mg/dL (ref 70–140)
POTASSIUM: 3.8 mmol/L (ref 3.5–5.1)
Sodium: 140 mmol/L (ref 136–145)
Total Bilirubin: <0.2 mg/dL — ABNORMAL LOW (ref 0.2–1.2)
Total Protein: 6.9 g/dL (ref 6.4–8.3)

## 2017-11-23 LAB — CBC WITH DIFFERENTIAL (CANCER CENTER ONLY)
Basophils Absolute: 0.1 10*3/uL (ref 0.0–0.1)
Basophils Relative: 1 %
EOS ABS: 0 10*3/uL (ref 0.0–0.5)
EOS PCT: 1 %
HCT: 31.9 % — ABNORMAL LOW (ref 34.8–46.6)
Hemoglobin: 10.7 g/dL — ABNORMAL LOW (ref 11.6–15.9)
LYMPHS ABS: 1.1 10*3/uL (ref 0.9–3.3)
Lymphocytes Relative: 20 %
MCH: 30.4 pg (ref 25.1–34.0)
MCHC: 33.7 g/dL (ref 31.5–36.0)
MCV: 90.3 fL (ref 79.5–101.0)
MONO ABS: 0.7 10*3/uL (ref 0.1–0.9)
MONOS PCT: 12 %
Neutro Abs: 3.7 10*3/uL (ref 1.5–6.5)
Neutrophils Relative %: 66 %
PLATELETS: 288 10*3/uL (ref 145–400)
RBC: 3.54 MIL/uL — ABNORMAL LOW (ref 3.70–5.45)
RDW: 15.8 % — AB (ref 11.2–14.5)
WBC Count: 5.7 10*3/uL (ref 3.9–10.3)

## 2017-11-23 MED ORDER — PEGFILGRASTIM 6 MG/0.6ML ~~LOC~~ PSKT
PREFILLED_SYRINGE | SUBCUTANEOUS | Status: AC
  Filled 2017-11-23: qty 0.6

## 2017-11-23 MED ORDER — HEPARIN SOD (PORK) LOCK FLUSH 100 UNIT/ML IV SOLN
500.0000 [IU] | Freq: Once | INTRAVENOUS | Status: AC | PRN
  Administered 2017-11-23: 500 [IU]
  Filled 2017-11-23: qty 5

## 2017-11-23 MED ORDER — PEGFILGRASTIM 6 MG/0.6ML ~~LOC~~ PSKT
6.0000 mg | PREFILLED_SYRINGE | Freq: Once | SUBCUTANEOUS | Status: AC
  Administered 2017-11-23: 6 mg via SUBCUTANEOUS

## 2017-11-23 MED ORDER — SODIUM CHLORIDE 0.9 % IV SOLN
Freq: Once | INTRAVENOUS | Status: AC
  Administered 2017-11-23: 13:00:00 via INTRAVENOUS

## 2017-11-23 MED ORDER — PALONOSETRON HCL INJECTION 0.25 MG/5ML
INTRAVENOUS | Status: AC
  Filled 2017-11-23: qty 5

## 2017-11-23 MED ORDER — SODIUM CHLORIDE 0.9 % IV SOLN
Freq: Once | INTRAVENOUS | Status: AC
  Administered 2017-11-23: 13:00:00 via INTRAVENOUS
  Filled 2017-11-23: qty 5

## 2017-11-23 MED ORDER — PALONOSETRON HCL INJECTION 0.25 MG/5ML
0.2500 mg | Freq: Once | INTRAVENOUS | Status: AC
  Administered 2017-11-23: 0.25 mg via INTRAVENOUS

## 2017-11-23 MED ORDER — SODIUM CHLORIDE 0.9 % IJ SOLN
10.0000 mL | Freq: Once | INTRAMUSCULAR | Status: AC
  Administered 2017-11-23: 10 mL via INTRAVENOUS
  Filled 2017-11-23: qty 10

## 2017-11-23 MED ORDER — SODIUM CHLORIDE 0.9 % IV SOLN
600.0000 mg/m2 | Freq: Once | INTRAVENOUS | Status: AC
  Administered 2017-11-23: 1240 mg via INTRAVENOUS
  Filled 2017-11-23: qty 62

## 2017-11-23 MED ORDER — SODIUM CHLORIDE 0.9% FLUSH
10.0000 mL | INTRAVENOUS | Status: DC | PRN
  Administered 2017-11-23: 10 mL
  Filled 2017-11-23: qty 10

## 2017-11-23 MED ORDER — PROCHLORPERAZINE MALEATE 10 MG PO TABS: 10.0000 mg | ORAL_TABLET | Freq: Four times a day (QID) | ORAL | 2 refills | Status: DC | PRN

## 2017-11-23 MED ORDER — DOXORUBICIN HCL CHEMO IV INJECTION 2 MG/ML
60.0000 mg/m2 | Freq: Once | INTRAVENOUS | Status: AC
  Administered 2017-11-23: 124 mg via INTRAVENOUS
  Filled 2017-11-23: qty 62

## 2017-11-23 NOTE — Patient Instructions (Signed)
[facility] Discharge Instructions for Patients Receiving Chemotherapy  Today you received the following chemotherapy agents adriamycin/cytoxan  To help prevent nausea and vomiting after your treatment, we encourage you to take your nausea medication as directed  If you develop nausea and vomiting that is not controlled by your nausea medication, call the clinic.   BELOW ARE SYMPTOMS THAT SHOULD BE REPORTED IMMEDIATELY:  *FEVER GREATER THAN 100.5 F  *CHILLS WITH OR WITHOUT FEVER  NAUSEA AND VOMITING THAT IS NOT CONTROLLED WITH YOUR NAUSEA MEDICATION  *UNUSUAL SHORTNESS OF BREATH  *UNUSUAL BRUISING OR BLEEDING  TENDERNESS IN MOUTH AND THROAT WITH OR WITHOUT PRESENCE OF ULCERS  *URINARY PROBLEMS  *BOWEL PROBLEMS  UNUSUAL RASH Items with * indicate a potential emergency and should be followed up as soon as possible.  Feel free to call the clinic you have any questions or concerns. The clinic phone number is [phone].  

## 2017-11-23 NOTE — Telephone Encounter (Signed)
Scheduled appt per 4/22 los - Gave patient aVs and calender per los.

## 2017-11-23 NOTE — Patient Instructions (Signed)

## 2017-11-23 NOTE — Telephone Encounter (Signed)
Scheduled appt per 4/22 los- Gave patient aVS and calender per los.  

## 2017-12-08 ENCOUNTER — Inpatient Hospital Stay: Payer: Medicare Other

## 2017-12-08 ENCOUNTER — Inpatient Hospital Stay (HOSPITAL_BASED_OUTPATIENT_CLINIC_OR_DEPARTMENT_OTHER): Payer: Medicare Other | Admitting: Nurse Practitioner

## 2017-12-08 ENCOUNTER — Encounter: Payer: Self-pay | Admitting: Nurse Practitioner

## 2017-12-08 ENCOUNTER — Other Ambulatory Visit: Payer: Self-pay | Admitting: Hematology

## 2017-12-08 ENCOUNTER — Inpatient Hospital Stay: Payer: Medicare Other | Attending: Hematology

## 2017-12-08 ENCOUNTER — Telehealth: Payer: Self-pay | Admitting: Hematology

## 2017-12-08 VITALS — BP 141/74 | HR 88 | Temp 98.2°F | Resp 18 | Ht 68.0 in | Wt 186.4 lb

## 2017-12-08 VITALS — BP 117/75 | HR 76 | Temp 98.0°F | Resp 18

## 2017-12-08 DIAGNOSIS — K5900 Constipation, unspecified: Secondary | ICD-10-CM | POA: Diagnosis not present

## 2017-12-08 DIAGNOSIS — Z171 Estrogen receptor negative status [ER-]: Secondary | ICD-10-CM | POA: Diagnosis not present

## 2017-12-08 DIAGNOSIS — C50412 Malignant neoplasm of upper-outer quadrant of left female breast: Secondary | ICD-10-CM | POA: Insufficient documentation

## 2017-12-08 DIAGNOSIS — Z5111 Encounter for antineoplastic chemotherapy: Secondary | ICD-10-CM | POA: Diagnosis not present

## 2017-12-08 DIAGNOSIS — M25561 Pain in right knee: Secondary | ICD-10-CM

## 2017-12-08 DIAGNOSIS — M2550 Pain in unspecified joint: Secondary | ICD-10-CM | POA: Diagnosis not present

## 2017-12-08 DIAGNOSIS — F419 Anxiety disorder, unspecified: Secondary | ICD-10-CM | POA: Diagnosis not present

## 2017-12-08 DIAGNOSIS — Z95828 Presence of other vascular implants and grafts: Secondary | ICD-10-CM

## 2017-12-08 DIAGNOSIS — R5383 Other fatigue: Secondary | ICD-10-CM

## 2017-12-08 DIAGNOSIS — M25562 Pain in left knee: Secondary | ICD-10-CM

## 2017-12-08 DIAGNOSIS — R530 Neoplastic (malignant) related fatigue: Secondary | ICD-10-CM | POA: Diagnosis not present

## 2017-12-08 LAB — CBC WITH DIFFERENTIAL (CANCER CENTER ONLY)
Basophils Absolute: 0 10*3/uL (ref 0.0–0.1)
Basophils Relative: 0 %
EOS PCT: 1 %
Eosinophils Absolute: 0 10*3/uL (ref 0.0–0.5)
HCT: 31 % — ABNORMAL LOW (ref 34.8–46.6)
Hemoglobin: 10.4 g/dL — ABNORMAL LOW (ref 11.6–15.9)
Lymphocytes Relative: 19 %
Lymphs Abs: 0.7 10*3/uL — ABNORMAL LOW (ref 0.9–3.3)
MCH: 30.9 pg (ref 25.1–34.0)
MCHC: 33.4 g/dL (ref 31.5–36.0)
MCV: 92.3 fL (ref 79.5–101.0)
MONO ABS: 1 10*3/uL — AB (ref 0.1–0.9)
MONOS PCT: 26 %
NEUTROS PCT: 54 %
Neutro Abs: 2.2 10*3/uL (ref 1.5–6.5)
PLATELETS: 185 10*3/uL (ref 145–400)
RBC: 3.36 MIL/uL — AB (ref 3.70–5.45)
RDW: 19 % — ABNORMAL HIGH (ref 11.2–14.5)
WBC Count: 4 10*3/uL (ref 3.9–10.3)

## 2017-12-08 LAB — CMP (CANCER CENTER ONLY)
ALK PHOS: 111 U/L (ref 40–150)
ALT: 32 U/L (ref 0–55)
AST: 30 U/L (ref 5–34)
Albumin: 3.5 g/dL (ref 3.5–5.0)
Anion gap: 7 (ref 3–11)
BUN: 6 mg/dL — ABNORMAL LOW (ref 7–26)
CALCIUM: 9 mg/dL (ref 8.4–10.4)
CO2: 27 mmol/L (ref 22–29)
CREATININE: 0.74 mg/dL (ref 0.60–1.10)
Chloride: 106 mmol/L (ref 98–109)
GFR, Estimated: >60 mL/min (ref >60)
Glucose, Bld: 93 mg/dL (ref 70–140)
Potassium: 3.7 mmol/L (ref 3.5–5.1)
SODIUM: 140 mmol/L (ref 136–145)
Total Bilirubin: <0.2 mg/dL — ABNORMAL LOW (ref 0.2–1.2)
Total Protein: 6.5 g/dL (ref 6.4–8.3)

## 2017-12-08 MED ORDER — LORAZEPAM 1 MG PO TABS
0.5000 mg | ORAL_TABLET | Freq: Once | ORAL | Status: AC
  Administered 2017-12-08: 0.5 mg via ORAL

## 2017-12-08 MED ORDER — DIPHENHYDRAMINE HCL 50 MG/ML IJ SOLN
50.0000 mg | Freq: Once | INTRAMUSCULAR | Status: AC
  Administered 2017-12-08: 50 mg via INTRAVENOUS

## 2017-12-08 MED ORDER — SODIUM CHLORIDE 0.9 % IV SOLN
175.0000 mg/m2 | Freq: Once | INTRAVENOUS | Status: AC
  Administered 2017-12-08: 360 mg via INTRAVENOUS
  Filled 2017-12-08: qty 60

## 2017-12-08 MED ORDER — DIPHENHYDRAMINE HCL 50 MG/ML IJ SOLN
INTRAMUSCULAR | Status: AC
  Filled 2017-12-08: qty 1

## 2017-12-08 MED ORDER — FAMOTIDINE IN NACL 20-0.9 MG/50ML-% IV SOLN
INTRAVENOUS | Status: AC
  Filled 2017-12-08: qty 50

## 2017-12-08 MED ORDER — SODIUM CHLORIDE 0.9% FLUSH
10.0000 mL | Freq: Once | INTRAVENOUS | Status: AC
  Administered 2017-12-08: 10 mL
  Filled 2017-12-08: qty 10

## 2017-12-08 MED ORDER — ALBUTEROL SULFATE HFA 108 (90 BASE) MCG/ACT IN AERS: 2.0000 | INHALATION_SPRAY | Freq: Four times a day (QID) | RESPIRATORY_TRACT | 2 refills | Status: DC | PRN

## 2017-12-08 MED ORDER — SODIUM CHLORIDE 0.9 % IV SOLN
20.0000 mg | Freq: Once | INTRAVENOUS | Status: AC
  Administered 2017-12-08: 20 mg via INTRAVENOUS
  Filled 2017-12-08: qty 2

## 2017-12-08 MED ORDER — FAMOTIDINE IN NACL 20-0.9 MG/50ML-% IV SOLN
20.0000 mg | Freq: Once | INTRAVENOUS | Status: AC
  Administered 2017-12-08: 20 mg via INTRAVENOUS

## 2017-12-08 MED ORDER — SODIUM CHLORIDE 0.9% FLUSH
10.0000 mL | INTRAVENOUS | Status: DC | PRN
  Administered 2017-12-08: 10 mL
  Filled 2017-12-08: qty 10

## 2017-12-08 MED ORDER — LORAZEPAM 1 MG PO TABS
ORAL_TABLET | ORAL | Status: AC
  Filled 2017-12-08: qty 1

## 2017-12-08 MED ORDER — LIDOCAINE 5 % EX PTCH: 0.5000 | MEDICATED_PATCH | Freq: Every day | CUTANEOUS | 2 refills | Status: DC | PRN

## 2017-12-08 MED ORDER — HEPARIN SOD (PORK) LOCK FLUSH 100 UNIT/ML IV SOLN
500.0000 [IU] | Freq: Once | INTRAVENOUS | Status: AC | PRN
  Administered 2017-12-08: 500 [IU]
  Filled 2017-12-08: qty 5

## 2017-12-08 MED ORDER — SODIUM CHLORIDE 0.9 % IV SOLN
Freq: Once | INTRAVENOUS | Status: AC
  Administered 2017-12-08: 10:00:00 via INTRAVENOUS

## 2017-12-08 NOTE — Telephone Encounter (Signed)
Gave patient avs report and appointments for May and June. Patient scheduled with [facility] for 6/17 f/u also due to YF out of office and patients requests that her appointments remain on Mondays - per patient she came Tuesday this time as something got out of whack.

## 2017-12-08 NOTE — Patient Instructions (Signed)
[facility] Discharge Instructions for Patients Receiving Chemotherapy  Today you received the following chemotherapy agents: Taxol.  To help prevent nausea and vomiting after your treatment, we encourage you to take your nausea medication: Compazine. Take one every 6 hours as needed.   If you develop nausea and vomiting that is not controlled by your nausea medication, call the clinic.   BELOW ARE SYMPTOMS THAT SHOULD BE REPORTED IMMEDIATELY:  *FEVER GREATER THAN 100.5 F  *CHILLS WITH OR WITHOUT FEVER  NAUSEA AND VOMITING THAT IS NOT CONTROLLED WITH YOUR NAUSEA MEDICATION  *UNUSUAL SHORTNESS OF BREATH  *UNUSUAL BRUISING OR BLEEDING  TENDERNESS IN MOUTH AND THROAT WITH OR WITHOUT PRESENCE OF ULCERS  *URINARY PROBLEMS  *BOWEL PROBLEMS  UNUSUAL RASH Items with * indicate a potential emergency and should be followed up as soon as possible.  Feel free to call the clinic should you have any questions or concerns. The clinic phone number is [phone].  Please show the CHEMO ALERT CARD at check-in to the Emergency Department and triage nurse.   

## 2017-12-08 NOTE — Patient Instructions (Signed)

## 2017-12-08 NOTE — Progress Notes (Signed)
[place]  [phone] [phone]  Clinic Follow up Note   Patient Care Team: [name], MD as PCP - General (Family Medicine) 12/08/2017  SUMMARY OF ONCOLOGIC HISTORY: Oncology History   Cancer Staging Malignant neoplasm of upper-outer quadrant of left breast in female, estrogen receptor negative ([place]) Staging form: Breast, AJCC 8th Edition - Pathologic stage from 08/19/2017: Stage IIA (pT2, pN0(sn), cM0, G3, ER: Negative, PR: Negative, HER2: Negative) - Signed by Ardath Sax, MD on 09/01/2017       Malignant neoplasm of upper-outer quadrant of left breast in female, estrogen receptor negative ([place])   06/16/2017 Initial Diagnosis    Malignant neoplasm of upper-outer quadrant of left breast in female: Initially presented with left deltoid mass which was consistent with lipoma.  During examination, a mass in the left breast was appreciated by palpation.      06/16/2017 Mammogram    BL Diagnostic Study: Irregular hyperdense mass in the upper outer quadrant of the left breast measuring 3.6 cm without radiographically detectable lymphadenopathy.  Normal appearance of the right breast.      06/16/2017 Breast US    3.2 cm irregular mass located at 2:30 o'clock, no pathological lymphadenopathy.      06/23/2017 Pathology Results    Ultrasound-guided biopsy: Positive for invasive ductal carcinoma, grade 3, ER 0%, PR 0%, HER-2 negative by [facility]      07/31/2017 Surgery    Partial left mastectomy with sentinel lymph node biopsy --Pathology: Invasive ductal carcinoma, measuring 4.5 cm, grade 3.  All margins negative, but the anterior margin less than 0.1 cm.  No surrounding DCIS.  0/5 lymph nodes positive for malignancy      10/08/2017 PET scan    IMPRESSION: 1. No evidence breast cancer metastasis on FDG PET-CT scan. 2. Postsurgical change in the LEFT lateral breast and LEFT axilla.     CURRENT THERAPY: Adjuvant chemotherapy AC every 2  weeks for 4 cycles, followed by bi-weekly Taxol for 4 cycles starting on 10/12/17. AC completed on 11/23/17.    INTERVAL HISTORY: Ms. Mould returns for follow up as scheduled. She completed cycle 4 AC with Onpro on 11/23/17. She experienced increased fatigue that lasted longer with the last cycle. She recovered only a few days ago. Has good appetite but does not drink much water. Has periodic constipation with increased protein in her diet, takes stool softener and laxative PRN. No blood in stool. Has allergy symptoms from working in the yard lately, takes claritin daily and albuterol inh PRN. No fever, chills, cough, chest pain, or dyspnea. Chronic arthritic pain in back and knees is stable.   REVIEW OF SYSTEMS:   Constitutional: Denies fevers, chills or abnormal weight loss (+) increased fatigue with delayed recovery Eyes: Denies blurriness of vision Ears, nose, mouth, throat, and face: Denies mucositis or sore throat (+) allergy symptoms, congestion  Respiratory: Denies cough, dyspnea or wheezes Cardiovascular: Denies palpitation, chest discomfort or lower extremity swelling Gastrointestinal:  Denies nausea, vomiting, diarrhea, heartburn or change in bowel habits (+) periodic constipation with increased protein in diet   Skin: Denies abnormal skin rashes Lymphatics: Denies new lymphadenopathy or easy bruising Neurological:Denies numbness, tingling or new weaknesses Behavioral/Psych: Mood is stable, no new changes  MSK: (+) arthritic pain in back, knees  All other systems were reviewed with the patient and are negative.  MEDICAL HISTORY:  Past Medical History:  Diagnosis Date  . Anxiety   . Asthma   . Cancer ([place])  left breast cancer  . Hemorrhoids   . Lipoma of left shoulder   . Migraine   . Migraine   . 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 ABDOMINAL HYSTERECTOMY    . APPENDECTOMY    .  CESAREAN SECTION    . CHOLECYSTECTOMY    . ERCP    . IR FLUORO GUIDE PORT INSERTION RIGHT  10/05/2017  . IR US GUIDE VASC ACCESS RIGHT  10/05/2017  . LIVER SURGERY    . PARTIAL MASTECTOMY WITH AXILLARY SENTINEL LYMPH NODE BIOPSY Left 07/31/2017   Procedure: LEFT PARTIAL MASTECTOMY WITH AXILLARY SENTINEL LYMPH NODE BIOPSY;  Surgeon: Virl Cagey, MD;  Location: [facility];  Service: General;  Laterality: Left;    I have reviewed the social history and family history with the patient and they are unchanged from previous note.  ALLERGIES:  is allergic to onion; penicillins; sulfa antibiotics; yellow dye; and clindamycin/lincomycin.  MEDICATIONS:  Current Outpatient Medications  Medication Sig Dispense Refill  . albuterol (PROVENTIL HFA;VENTOLIN HFA) 108 (90 Base) MCG/ACT inhaler Inhale 2 puffs into the lungs every 6 (six) hours as needed for wheezing or shortness of breath. 6.7 g 2  . ALPRAZolam (XANAX) 1 MG tablet Take 1 tablet (1 mg total) by mouth daily as needed for anxiety. 30 tablet 0  . Bioflavonoid Products (BIOFLEX) TABS Take 1 tablet by mouth daily.    Marland Kitchen HYDROcodone-acetaminophen (NORCO) 5-325 MG tablet Take 1 tablet by mouth every 6 (six) hours as needed for moderate pain. 30 tablet 0  . Hypromellose (ARTIFICIAL TEARS OP) Apply to eye 2 (two) times daily. Doesn't do drops in eyes does as a wash to flush eyes out    . ibuprofen (ADVIL,MOTRIN) 800 MG tablet Take 800 mg by mouth every 8 (eight) hours as needed.    . lidocaine (LIDODERM) 5 % Place 0.5-1 patches onto the skin daily as needed (pain). Remove & Discard patch within 12 hours or as directed by MD 30 patch 2  . lidocaine-prilocaine (EMLA) cream Apply 1 application topically as needed. 30 g 2  . Menthol, Topical Analgesic, (BIOFREEZE EX) Apply 1 application topically as needed (pain).    . mirtazapine (REMERON) 7.5 MG tablet Take 1 tablet (7.5 mg total) by mouth at bedtime. 30 tablet 0  . ondansetron (ZOFRAN) 8 MG tablet Take 1  tablet (8 mg total) by mouth 2 (two) times daily as needed. Start on the third day after chemotherapy. 30 tablet 3  . oxyCODONE (OXY IR/ROXICODONE) 5 MG immediate release tablet Take 5 mg by mouth every 4 (four) hours as needed for severe pain.    Marland Kitchen prochlorperazine (COMPAZINE) 10 MG tablet Take 1 tablet (10 mg total) by mouth every 6 (six) hours as needed for nausea or vomiting. 30 tablet 2   No current facility-administered medications for this visit.     PHYSICAL EXAMINATION: ECOG PERFORMANCE STATUS: 2 - Symptomatic, <50% confined to bed  Vitals:   12/08/17 0837  BP: (!) 141/74  Pulse: 88  Resp: 18  Temp: 98.2 F (36.8 C)  SpO2: 97%   Filed Weights   12/08/17 0837  Weight: 186 lb 6.4 oz (84.6 kg)    GENERAL:alert, no distress and comfortable SKIN: skin color, texture, turgor are normal, no rashes or significant lesions EYES: normal, Conjunctiva are pink and non-injected, sclera clear OROPHARYNX:no exudate, no erythema and lips, buccal mucosa, and tongue  normal  LYMPH:  no palpable cervical, supraclavicular, or axillary lymphadenopathy LUNGS: clear to auscultation with normal breathing effort HEART: regular rate & rhythm and no murmurs and no lower extremity edema ABDOMEN:abdomen soft, non-tender and normal bowel sounds Musculoskeletal:no cyanosis of digits and no clubbing. Large LUE mass  NEURO: alert & oriented x 3 with fluent speech, no focal motor/sensory deficits Breasts: left breast surgical incision is well healed  PAC without erythema   LABORATORY DATA:  I have reviewed the data as listed CBC Latest Ref Rng & Units 12/08/2017 11/23/2017 11/09/2017  WBC 3.9 - 10.3 K/uL 4.0 5.7 7.2  Hemoglobin 11.6 - 15.9 g/dL 10.4(L) 10.7(L) 11.3(L)  Hematocrit 34.8 - 46.6 % 31.0(L) 31.9(L) 34.0(L)  Platelets 145 - 400 K/uL 185 288 264     CMP Latest Ref Rng & Units 12/08/2017 11/23/2017 11/09/2017  Glucose 70 - 140 mg/dL 93 103 109  BUN 7 - 26 mg/dL 6(L) 9 9  Creatinine 0.60 - 1.10  mg/dL 0.74 0.72 0.76  Sodium 136 - 145 mmol/L 140 140 140  Potassium 3.5 - 5.1 mmol/L 3.7 3.8 3.7  Chloride 98 - 109 mmol/L 106 106 105  CO2 22 - 29 mmol/L '27 24 27  ' Calcium 8.4 - 10.4 mg/dL 9.0 9.0 8.9  Total Protein 6.4 - 8.3 g/dL 6.5 6.9 6.8  Total Bilirubin 0.2 - 1.2 mg/dL <0.2(L) <0.2(L) <0.2(L)  Alkaline Phos 40 - 150 U/L 111 106 111  AST 5 - 34 U/L '30 24 19  ' ALT 0 - 55 U/L 32 25 22   PROCEDURE  Echo 10/08/17 Study Conclusions - Left ventricle: The cavity size was normal. There was moderate concentric hypertrophy. Systolic function was normal. The estimated ejection fraction was in the range of 55% to 60%. Wall motion was normal; there were no regional wall motion abnormalities. Doppler parameters are consistent with abnormal left ventricular relaxation (grade 1 diastolic dysfunction). There was no evidence of elevated ventricular filling pressure by Doppler parameters. - Aortic valve: Trileaflet; normal thickness leaflets. There was no regurgitation. - Aortic root: The aortic root was normal in size. - Mitral valve: There was no regurgitation. - Right ventricle: Systolic function was normal. - Right atrium: The atrium was normal in size. - Tricuspid valve: There was no regurgitation. - Pulmonic valve: There was no regurgitation. - Pulmonary arteries: Systolic pressure could not be accurately estimated. - Inferior vena cava: The vessel was normal in size. - Pericardium, extracardiac: There was no pericardial effusion.  PATHOLOGY  Initial biopsy  Diagnosis 07/02/17 Breast, left, needle core biopsy, 2:00 - INVASIVE DUCTAL CARCINOMA. - SEE COMMENT. Microscopic Comment The carcinoma appears grade III. A breast prognostic profile will be performed and the results reported separately. The results were called to [facility] on 06/24/2017. (JBK:ecj 06/24/2017) Results: HER2 - NEGATIVE RATIO OF HER2/CEP17 SIGNALS 1.60 AVERAGE HER2  COPY NUMBER PER CELL 3.60 Results: IMMUNOHISTOCHEMICAL AND MORPHOMETRIC ANALYSIS PERFORMED MANUALLY Estrogen Receptor: 0%, NEGATIVE Progesterone Receptor: 0%, NEGATIVE Proliferation Marker Ki67: 90%  Surgical pathology  Diagnosis 07/31/18 1. Lymph node, sentinel, biopsy, left - ONE OF ONE LYMPH NODES NEGATIVE FOR CARCINOMA (0/1). 2. 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 - INVASIVE CARCINOMA IS <0.1 CM OF THE ANTERIOR MARGIN BROADLY. - SEE ONCOLOGY TABLE. 3. Breast, excision, left deep - BENIGN BREAST TISSUE. 4. Breast, excision, left superior - BENIGN BREAST TISSUE. 5. Breast, excision, left medial - BENIGN BREAST TISSUE. 6. Breast, excision, left inferior - BENIGN  BREAST TISSUE. 7. Breast, excision, left lateral - BENIGN BREAST TISSUE. 8. Lymph node, sentinel, biopsy - ONE OF ONE LYMPH NODES NEGATIVE FOR CARCINOMA (0/1). 9. Lymph node, sentinel, biopsy - ONE OF ONE LYMPH NODES NEGATIVE FOR CARCINOMA (0/1). 10. Lymph node, sentinel, biopsy - ONE OF ONE LYMPH NODES NEGATIVE FOR CARCINOMA (0/1). 11. Lymph node, sentinel, biopsy - ONE OF ONE LYMPH NODES NEGATIVE FOR CARCINOMA (0/1). Microscopic Comment 2. BREAST, INVASIVE TUMOR Procedure: Left partial mastectomy with additional margins and left sentinel lymph node biopsies. Laterality: Left. 1 of 4 FINAL for CIANA, SIMMON ([id number]) Microscopic Comment(continued) Tumor Size: 4.5 cm. Histologic Type: Invasive ductal carcinoma. Grade: 3 Tubular Differentiation: 2 Nuclear Pleomorphism: 3 Mitotic Count: 3 Ductal Carcinoma in Situ (DCIS): Not identified. Extent of Tumor: Confined to breast parenchyma. Margins: Invasive carcinoma, distance from closest margin: <0.1 cm of anterior margin broadly. Remaining final margins (parts 3-7) are >1 cm. DCIS, distance from closest margin: N/A. Regional Lymph Nodes: Number of Lymph Nodes Examined: 5 Number of Sentinel Lymph Nodes  Examined: 5 Lymph Nodes with Macrometastases: 0 Lymph Nodes with Micrometastases: 0 Lymph Nodes with Isolated Tumor Cells: 0 Breast Prognostic Profile: Performed on biopsy [id number], see below. Will not be repeated. Estrogen Receptor: Negative. Progesterone Receptor: Negative. Her2: Negative (ratio 1.60). Ki-67: 90%. Best tumor block for sendout testing: 2D Pathologic Stage Classification (pTNM, AJCC 8th Edition): Primary Tumor (pT): pT2 Regional Lymph Nodes (pN): pN0 Distant Metastases (pM): pMX Comments: None.    RADIOGRAPHIC STUDIES: I have personally reviewed the radiological images as listed and agreed with the findings in the report. No results found.   ASSESSMENT & PLAN: VERGENE MARLAND 67 y.o. 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  1. 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  Proliferation marker Ki67 at 90% -Ms. Notarianni appears stable. She completed 4 cycles adjuvant AC with onpro on 4/22. She tolerated well with prolonged fatigue with the final cycle. She has recovered well. I encouraged her to eat well, increase water intake, and be active to help with chemotherapy-related fatigue. Labs reviewed. Proceed with cycle 1 taxol today. We reviewed potential side effects and symptom management. Continue q2 weeks for total 4 cycles. F/u in 2 weeks with cycle 2.  2. Anxiety -BP slightly elevated today, she attributes to mild anxiety related to difficulty obtaining blood return from [facility]. Stable.   3. Large left upper arm lipoma  -stable. Pending surgical resection at [facility] after chemotherapy   4. Genetics  -Has been referred to genetics, rescheduled appointment various times.   5. Insomnia -Takes remeron PRN, few times per week   6. Nausea, constipation/diarrhea, secondary to chemotherapy  -Overall stable and controlled. She will call pharmacy  for refills on zofran and compazine. Takes laxative and stool softener PRN to maintain regular BM  7. Knee pain, right ankle pain  -I refilled lidoderm patch for arthritic pain.    PLAN -Labs reviewed, proceed with cycle 1 taxol, continue q2 weeks for total 4 cycles -Refilled albuterol, lidoderm patch -f/u 2 weeks with Dr. Burr Medico prior to cycle 2 taxol   All questions were answered. The patient knows to call the clinic with any problems, questions or concerns. No barriers to learning was detected. I spent 20 minutes counseling the patient face to face. The total time spent in the appointment was 25 minutes and more than 50% was on  counseling and review of test results     Alla Feeling, NP 12/08/17

## 2017-12-11 ENCOUNTER — Telehealth: Payer: Self-pay | Admitting: *Deleted

## 2017-12-11 ENCOUNTER — Other Ambulatory Visit: Payer: Self-pay | Admitting: Hematology

## 2017-12-11 DIAGNOSIS — C50412 Malignant neoplasm of upper-outer quadrant of left female breast: Secondary | ICD-10-CM

## 2017-12-11 DIAGNOSIS — Z171 Estrogen receptor negative status [ER-]: Principal | ICD-10-CM

## 2017-12-11 MED ORDER — OXYCODONE HCL 5 MG PO TABS: 5.0000 mg | ORAL_TABLET | ORAL | 0 refills | Status: DC | PRN

## 2017-12-11 NOTE — Telephone Encounter (Signed)
Call from pt's daughter reporting pt is aching all over her body. She has tried tylenol and ibuprofen without any relief. Spoke with pt she reports her legs are jumping and she is having arm spasms. Pt rates pain at 15/10. She states she "can't do anything but cry."  Message to providers.

## 2017-12-11 NOTE — Telephone Encounter (Signed)
I have called patient back.  Patient has tried tramadol and hydrocodone, did not help his pain.  She used oxycodone before, I refilled oxycodone 5 mg #20, and will see her back next week.  She previously took narcotics intermittently for her chronic back pain, discussed narcotics addiction, she is aware, and knows to take minimal on month, and to stop after her body pain improves.  Truitt Merle MD

## 2017-12-14 NOTE — Progress Notes (Signed)
Prior auth for Lidocaine 5% patches has been submitted. Status is pending.

## 2017-12-17 ENCOUNTER — Other Ambulatory Visit: Payer: Self-pay | Admitting: Hematology

## 2017-12-21 ENCOUNTER — Encounter: Payer: Self-pay | Admitting: Hematology

## 2017-12-21 ENCOUNTER — Inpatient Hospital Stay (HOSPITAL_BASED_OUTPATIENT_CLINIC_OR_DEPARTMENT_OTHER): Payer: Medicare Other | Admitting: Hematology

## 2017-12-21 ENCOUNTER — Inpatient Hospital Stay: Payer: Medicare Other

## 2017-12-21 VITALS — BP 132/84 | HR 82 | Temp 97.7°F | Resp 18 | Ht 68.0 in | Wt 188.6 lb

## 2017-12-21 DIAGNOSIS — M7989 Other specified soft tissue disorders: Secondary | ICD-10-CM

## 2017-12-21 DIAGNOSIS — Z171 Estrogen receptor negative status [ER-]: Secondary | ICD-10-CM

## 2017-12-21 DIAGNOSIS — C50412 Malignant neoplasm of upper-outer quadrant of left female breast: Secondary | ICD-10-CM

## 2017-12-21 DIAGNOSIS — F419 Anxiety disorder, unspecified: Secondary | ICD-10-CM

## 2017-12-21 DIAGNOSIS — I10 Essential (primary) hypertension: Secondary | ICD-10-CM

## 2017-12-21 DIAGNOSIS — Z5111 Encounter for antineoplastic chemotherapy: Secondary | ICD-10-CM | POA: Diagnosis not present

## 2017-12-21 DIAGNOSIS — Z95828 Presence of other vascular implants and grafts: Secondary | ICD-10-CM

## 2017-12-21 LAB — CMP (CANCER CENTER ONLY)
ALK PHOS: 101 U/L (ref 40–150)
ALT: 32 U/L (ref 0–55)
AST: 22 U/L (ref 5–34)
Albumin: 3.6 g/dL (ref 3.5–5.0)
Anion gap: 8 (ref 3–11)
BILIRUBIN TOTAL: 0.2 mg/dL (ref 0.2–1.2)
BUN: 8 mg/dL (ref 7–26)
CALCIUM: 9.2 mg/dL (ref 8.4–10.4)
CO2: 26 mmol/L (ref 22–29)
CREATININE: 0.69 mg/dL (ref 0.60–1.10)
Chloride: 108 mmol/L (ref 98–109)
Glucose, Bld: 90 mg/dL (ref 70–140)
Potassium: 3.4 mmol/L — ABNORMAL LOW (ref 3.5–5.1)
SODIUM: 142 mmol/L (ref 136–145)
TOTAL PROTEIN: 6.7 g/dL (ref 6.4–8.3)

## 2017-12-21 LAB — CBC WITH DIFFERENTIAL (CANCER CENTER ONLY)
Basophils Absolute: 0.1 10*3/uL (ref 0.0–0.1)
Basophils Relative: 2 %
EOS ABS: 0.1 10*3/uL (ref 0.0–0.5)
Eosinophils Relative: 3 %
HCT: 32.4 % — ABNORMAL LOW (ref 34.8–46.6)
HEMOGLOBIN: 10.5 g/dL — AB (ref 11.6–15.9)
LYMPHS ABS: 0.9 10*3/uL (ref 0.9–3.3)
LYMPHS PCT: 30 %
MCH: 30.3 pg (ref 25.1–34.0)
MCHC: 32.4 g/dL (ref 31.5–36.0)
MCV: 93.6 fL (ref 79.5–101.0)
MONOS PCT: 16 %
Monocytes Absolute: 0.5 10*3/uL (ref 0.1–0.9)
NEUTROS PCT: 49 %
Neutro Abs: 1.5 10*3/uL (ref 1.5–6.5)
Platelet Count: 336 10*3/uL (ref 145–400)
RBC: 3.46 MIL/uL — ABNORMAL LOW (ref 3.70–5.45)
RDW: 16.9 % — ABNORMAL HIGH (ref 11.2–14.5)
WBC Count: 3 10*3/uL — ABNORMAL LOW (ref 3.9–10.3)

## 2017-12-21 MED ORDER — SODIUM CHLORIDE 0.9 % IV SOLN
Freq: Once | INTRAVENOUS | Status: AC
  Administered 2017-12-21: 11:00:00 via INTRAVENOUS

## 2017-12-21 MED ORDER — DIPHENHYDRAMINE HCL 50 MG/ML IJ SOLN
50.0000 mg | Freq: Once | INTRAMUSCULAR | Status: AC
  Administered 2017-12-21: 50 mg via INTRAVENOUS

## 2017-12-21 MED ORDER — FAMOTIDINE IN NACL 20-0.9 MG/50ML-% IV SOLN
20.0000 mg | Freq: Once | INTRAVENOUS | Status: AC
  Administered 2017-12-21: 20 mg via INTRAVENOUS

## 2017-12-21 MED ORDER — DIPHENHYDRAMINE HCL 50 MG/ML IJ SOLN
INTRAMUSCULAR | Status: AC
  Filled 2017-12-21: qty 1

## 2017-12-21 MED ORDER — FAMOTIDINE IN NACL 20-0.9 MG/50ML-% IV SOLN
INTRAVENOUS | Status: AC
  Filled 2017-12-21: qty 50

## 2017-12-21 MED ORDER — DEXAMETHASONE SODIUM PHOSPHATE 100 MG/10ML IJ SOLN
20.0000 mg | Freq: Once | INTRAMUSCULAR | Status: AC
  Administered 2017-12-21: 20 mg via INTRAVENOUS
  Filled 2017-12-21: qty 2

## 2017-12-21 MED ORDER — HEPARIN SOD (PORK) LOCK FLUSH 100 UNIT/ML IV SOLN
500.0000 [IU] | Freq: Once | INTRAVENOUS | Status: AC | PRN
  Administered 2017-12-21: 500 [IU]
  Filled 2017-12-21: qty 5

## 2017-12-21 MED ORDER — SODIUM CHLORIDE 0.9% FLUSH
10.0000 mL | INTRAVENOUS | Status: DC | PRN
  Administered 2017-12-21: 10 mL
  Filled 2017-12-21: qty 10

## 2017-12-21 MED ORDER — SODIUM CHLORIDE 0.9 % IV SOLN
175.0000 mg/m2 | Freq: Once | INTRAVENOUS | Status: AC
  Administered 2017-12-21: 360 mg via INTRAVENOUS
  Filled 2017-12-21: qty 60

## 2017-12-21 MED ORDER — SODIUM CHLORIDE 0.9% FLUSH
10.0000 mL | Freq: Once | INTRAVENOUS | Status: AC
  Administered 2017-12-21: 10 mL
  Filled 2017-12-21: qty 10

## 2017-12-21 NOTE — Patient Instructions (Signed)
[facility] Discharge Instructions for Patients Receiving Chemotherapy  Today you received the following chemotherapy agents:  Taxol.  To help prevent nausea and vomiting after your treatment, we encourage you to take your nausea medication as directed.   If you develop nausea and vomiting that is not controlled by your nausea medication, call the clinic.   BELOW ARE SYMPTOMS THAT SHOULD BE REPORTED IMMEDIATELY:  *FEVER GREATER THAN 100.5 F  *CHILLS WITH OR WITHOUT FEVER  NAUSEA AND VOMITING THAT IS NOT CONTROLLED WITH YOUR NAUSEA MEDICATION  *UNUSUAL SHORTNESS OF BREATH  *UNUSUAL BRUISING OR BLEEDING  TENDERNESS IN MOUTH AND THROAT WITH OR WITHOUT PRESENCE OF ULCERS  *URINARY PROBLEMS  *BOWEL PROBLEMS  UNUSUAL RASH Items with * indicate a potential emergency and should be followed up as soon as possible.  Feel free to call the clinic should you have any questions or concerns. The clinic phone number is [phone].  Please show the CHEMO ALERT CARD at check-in to the Emergency Department and triage nurse.   

## 2017-12-21 NOTE — Progress Notes (Signed)
Prior auth for Lidocaine 5% patch has been approved. 

## 2017-12-21 NOTE — Progress Notes (Signed)
[place]  [phone] [phone]  Clinic Follow up Note   Patient Care Team: [name], MD as PCP - General (Family Medicine)   Date of Service:  12/21/2017   CHIEF COMPLAINT: F/u triple negative breast cancer   SUMMARY OF ONCOLOGIC HISTORY: Oncology History   Cancer Staging Malignant neoplasm of upper-outer quadrant of left breast in female, estrogen receptor negative ([place]) Staging form: Breast, AJCC 8th Edition - Pathologic stage from 08/19/2017: Stage IIA (pT2, pN0(sn), cM0, G3, ER: Negative, PR: Negative, HER2: Negative) - Signed by Ardath Sax, MD on 09/01/2017       Malignant neoplasm of upper-outer quadrant of left breast in female, estrogen receptor negative ([place])   06/16/2017 Initial Diagnosis    Malignant neoplasm of upper-outer quadrant of left breast in female: Initially presented with left deltoid mass which was consistent with lipoma.  During examination, a mass in the left breast was appreciated by palpation.      06/16/2017 Mammogram    BL Diagnostic Study: Irregular hyperdense mass in the upper outer quadrant of the left breast measuring 3.6 cm without radiographically detectable lymphadenopathy.  Normal appearance of the right breast.      06/16/2017 Breast US    3.2 cm irregular mass located at 2:30 o'clock, no pathological lymphadenopathy.      06/23/2017 Pathology Results    Ultrasound-guided biopsy: Positive for invasive ductal carcinoma, grade 3, ER 0%, PR 0%, HER-2 negative by [facility]      07/31/2017 Surgery    Partial left mastectomy with sentinel lymph node biopsy --Pathology: Invasive ductal carcinoma, measuring 4.5 cm, grade 3.  All margins negative, but the anterior margin less than 0.1 cm.  No surrounding DCIS.  0/5 lymph nodes positive for malignancy      10/08/2017 PET scan    IMPRESSION: 1. No evidence breast cancer metastasis on FDG PET-CT scan. 2. Postsurgical change in the LEFT lateral breast  and LEFT axilla.      10/11/2017 -  Chemotherapy    The patient had DOXOrubicin (ADRIAMYCIN) chemo injection 124 mg, 60 mg/m2 = 124 mg, Intravenous,  Once, 4 of 4 cycles Administration: 124 mg (10/12/2017), 124 mg (10/26/2017), 124 mg (11/09/2017), 124 mg (11/23/2017) palonosetron (ALOXI) injection 0.25 mg, 0.25 mg, Intravenous,  Once, 4 of 4 cycles Administration: 0.25 mg (10/12/2017), 0.25 mg (10/26/2017), 0.25 mg (11/09/2017), 0.25 mg (11/23/2017) pegfilgrastim (NEULASTA ONPRO KIT) injection 6 mg, 6 mg, Subcutaneous, Once, 4 of 4 cycles Administration: 6 mg (10/12/2017), 6 mg (10/26/2017), 6 mg (11/09/2017), 6 mg (11/23/2017) cyclophosphamide (CYTOXAN) 1,240 mg in sodium chloride 0.9 % 250 mL chemo infusion, 600 mg/m2 = 1,240 mg, Intravenous,  Once, 4 of 4 cycles Administration: 1,240 mg (10/12/2017), 1,240 mg (10/26/2017), 1,240 mg (11/09/2017), 1,240 mg (11/23/2017) PACLitaxel (TAXOL) 360 mg in sodium chloride 0.9 % 500 mL chemo infusion (</= 19m/m2), 175 mg/m2 = 360 mg (100 % of original dose 175 mg/m2), Intravenous,  Once, 2 of 4 cycles Dose modification: 175 mg/m2 (original dose 175 mg/m2, Cycle 6, Reason: Provider Judgment) Administration: 360 mg (12/08/2017), 360 mg (12/21/2017) ondansetron (ZOFRAN) 8 mg in sodium chloride 0.9 % 50 mL IVPB, , Intravenous,  Once, 1 of 3 cycles fosaprepitant (EMEND) 150 mg, dexamethasone (DECADRON) 12 mg in sodium chloride 0.9 % 145 mL IVPB, , Intravenous,  Once, 4 of 4 cycles Administration:  (10/12/2017),  (10/26/2017),  (11/09/2017),  (11/23/2017)  for chemotherapy treatment.       HISTORY OF PRESENT ILLNESS: Mary Wise  67 y.o. female presents to the [facility] for follow up her left breast cancer that has recently been surgically resected. The patient was referred by Dr. Isidore Moos.  She was previously seen by my partner Dr. Lebron Conners at [facility].  Patient states she noticed a lump in her left breast after she had knocked into her bed and felt pain in  the area. 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 She saw her PCP and during the evaluation, a mass was discovered in the lateral aspect of the left breast leading to additional evaluation. She had been getting regular mammograms yearly before this with one abnormal result in the past that revealed fatty tissue.  Her diagnostic mammogram from 06/16/17 revealed a suspicious mass in the 230 o'clock location of the left breast with her initial biopsy confirming invasive ductal carcinoma. Prognostic indicators significant for: ER, 0% negative and PR, 0% negative. Proliferation marker Ki67 at 90%. HER2 negative. Pt underwent a left partial mastectomy with sentinel node biopsy with Dr. Constance Haw on 07/31/17. Surgical pathology results revealed invasive ductal carcinoma and DCIS, grade III, spanning 4.5 cm. Lymph nodes biopsied were negative.  She met Dr. Lebron Conners one month ago, adjuvant chemotherapy was recommended, but she declined.  She was seen by radiation oncologist Dr. Isidore Moos yesterday, who encouraged her to consider adjuvant chemotherapy, and referred her to me.  In the past she was diagnosed with medical conditions of anxiety, asthma, seizure and migraines. She has had gallstones that traveled to liver that required surgery, appendectomy, and complete hysterectomy.   She has a FHx of breast cancer, brain cancer, ovarian cancer, throat cancer, and lung cancer. She has a Hx of tobacco use for 9 years over 30 years ago. She  GYN HISTORY  Menarchal: 12 LMP: 70, complete hysterectomy  Contraceptive: HRT:  GP: 1 daughter, Joelene Millin, she works in the ED at [employer] she is divorced and lives by herself in an assisted living facility in [place]. She states she is able to do everything for herself. She enjoys the company of people and she is able to get meals there.   CURRENT THERAPY:  Adjuvant chemotherapy AC every 2 weeks for 4 cycles  10/12/17-11/23/17, followed by bi-weekly Taxol for 4 cycles starting 12/08/17.   INTERVAL HISTORY: Mary Wise is here for follow up and Cycle 2 Taxol. She presents to the clinic today accompanied by her friend. She notes she has swelling likely due to her salt intake. She notes she was able to tolerate Taxol better. She notes weepy tears for 2-3 days. No change in vision. She notes bone pain. She takes oxycodone as needed but pain continued for 6 days but improved.   On review of symptoms, pt notes she is swelling in her face, legs and feet. This has improved today. She denies current bone pain. She denies nausea.    REVIEW OF SYSTEMS:    Constitutional: Denies fevers, chills or abnormal weight loss   Eyes: Denies blurriness of vision Ears, nose, mouth, throat, and face: Denies mucositis or sore throat Respiratory: Denies cough, dyspnea or wheezes Cardiovascular: Denies palpitation, chest discomfort (+) Facial and lower extremity swelling Gastrointestinal:  Denies heartburn or change in bowel habits  Skin: Denies abnormal skin rashes MSK: (+) Arthritic pain in knee and low back Lymphatics: Denies new lymphadenopathy or easy bruising Neurological:Denies numbness, tingling or new weaknesses Behavioral/Psych:  (+) anxiety  Extremity: (+) lipoma of the upper left arm  All other systems were reviewed with the patient and are negative.  MEDICAL HISTORY:  Past Medical History:  Diagnosis Date  . Anxiety   . Asthma   . Cancer [facility])    left breast cancer  . Hemorrhoids   . Lipoma of left shoulder   . Migraine   . Migraine   . 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 ABDOMINAL HYSTERECTOMY    . APPENDECTOMY    . CESAREAN SECTION    . CHOLECYSTECTOMY    . ERCP    . IR FLUORO GUIDE PORT INSERTION RIGHT  10/05/2017  . IR US GUIDE VASC ACCESS RIGHT  10/05/2017  . LIVER SURGERY    . PARTIAL MASTECTOMY WITH  AXILLARY SENTINEL LYMPH NODE BIOPSY Left 07/31/2017   Procedure: LEFT PARTIAL MASTECTOMY WITH AXILLARY SENTINEL LYMPH NODE BIOPSY;  Surgeon: Virl Cagey, MD;  Location: [facility];  Service: General;  Laterality: Left;    I have reviewed the social history and family history with the patient and they are unchanged from previous note.  ALLERGIES:  is allergic to onion; penicillins; sulfa antibiotics; yellow dye; and clindamycin/lincomycin.  MEDICATIONS:  Current Outpatient Medications  Medication Sig Dispense Refill  . albuterol (PROVENTIL HFA;VENTOLIN HFA) 108 (90 Base) MCG/ACT inhaler Inhale 2 puffs into the lungs every 6 (six) hours as needed for wheezing or shortness of breath. 6.7 g 2  . ALPRAZolam (XANAX) 1 MG tablet Take 1 tablet (1 mg total) by mouth daily as needed for anxiety. 30 tablet 0  . Bioflavonoid Products (BIOFLEX) TABS Take 1 tablet by mouth daily.    Marland Kitchen HYDROcodone-acetaminophen (NORCO) 5-325 MG tablet Take 1 tablet by mouth every 6 (six) hours as needed for moderate pain. 30 tablet 0  . Hypromellose (ARTIFICIAL TEARS OP) Apply to eye 2 (two) times daily. Doesn't do drops in eyes does as a wash to flush eyes out    . ibuprofen (ADVIL,MOTRIN) 800 MG tablet Take 800 mg by mouth every 8 (eight) hours as needed.    . lidocaine (LIDODERM) 5 % Place 0.5-1 patches onto the skin daily as needed (pain). Remove & Discard patch within 12 hours or as directed by MD 30 patch 2  . lidocaine-prilocaine (EMLA) cream Apply 1 application topically as needed. 30 g 2  . Menthol, Topical Analgesic, (BIOFREEZE EX) Apply 1 application topically as needed (pain).    . mirtazapine (REMERON) 7.5 MG tablet Take 1 tablet (7.5 mg total) by mouth at bedtime. 30 tablet 0  . ondansetron (ZOFRAN) 8 MG tablet Take 1 tablet (8 mg total) by mouth 2 (two) times daily as needed. Start on the third day after chemotherapy. 30 tablet 3  . oxyCODONE (OXY IR/ROXICODONE) 5 MG immediate release tablet Take 1 tablet (5  mg total) by mouth every 4 (four) hours as needed for severe pain. 20 tablet 0  . prochlorperazine (COMPAZINE) 10 MG tablet Take 1 tablet (10 mg total) by mouth every 6 (six) hours as needed for nausea or vomiting. 30 tablet 2   No current facility-administered medications for this visit.    Facility-Administered Medications Ordered in Other Visits  Medication Dose Route Frequency Provider Last Rate Last Dose  . sodium chloride flush (NS) 0.9 % injection 10 mL  10 mL Intracatheter PRN Truitt Merle, MD   10 mL at 12/21/17 1443  PHYSICAL EXAMINATION: ECOG PERFORMANCE STATUS: 1 BP 132/84 (BP Location: Right Arm, Patient Position: Sitting)   Pulse 82   Temp 97.7 F (36.5 C) (Oral)   Resp 18   Ht _0  (1.727 m)   Wt 188 lb 9.6 oz (85.5 kg)   SpO2 100%   BMI 28.68 kg/m    GENERAL:alert, no distress and comfortable SKIN: skin color, texture, turgor are normal, no rashes or significant lesions EYES: normal, Conjunctiva are pink and non-injected, sclera clear OROPHARYNX:no exudate, no erythema and lips, buccal mucosa, and tongue normal  NECK: supple, thyroid normal size, non-tender, without nodularity LYMPH:  no palpable lymphadenopathy in the cervical, axillary or inguinal LUNGS: clear to auscultation and percussion with normal breathing effort HEART: regular rate & rhythm and no murmurs and no lower extremity edema ABDOMEN:abdomen soft, non-tender and normal bowel sounds. No hepatomegaly  Musculoskeletal:no cyanosis of digits and no clubbing  NEURO: alert & oriented x 3 with fluent speech, no focal motor/sensory deficits EXTREMITY: left upper extremity with 14 cm by 11 cm subcutaneous mass   LABORATORY DATA:  I have reviewed the data as listed CBC Latest Ref Rng & Units 12/21/2017 12/08/2017 11/23/2017  WBC 3.9 - 10.3 K/uL 3.0(L) 4.0 5.7  Hemoglobin 11.6 - 15.9 g/dL 10.5(L) 10.4(L) 10.7(L)  Hematocrit 34.8 - 46.6 % 32.4(L) 31.0(L) 31.9(L)  Platelets 145 - 400 K/uL 336 185 288      CMP Latest Ref Rng & Units 12/21/2017 12/08/2017 11/23/2017  Glucose 70 - 140 mg/dL 90 93 103  BUN 7 - 26 mg/dL 8 6(L) 9  Creatinine 0.60 - 1.10 mg/dL 0.69 0.74 0.72  Sodium 136 - 145 mmol/L 142 140 140  Potassium 3.5 - 5.1 mmol/L 3.4(L) 3.7 3.8  Chloride 98 - 109 mmol/L 108 106 106  CO2 22 - 29 mmol/L _1 Calcium 8.4 - 10.4 mg/dL 9.2 9.0 9.0  Total Protein 6.4 - 8.3 g/dL 6.7 6.5 6.9  Total Bilirubin 0.2 - 1.2 mg/dL 0.2 <0.2(L) <0.2(L)  Alkaline Phos 40 - 150 U/L 101 111 106  AST 5 - 34 U/L _2 ALT 0 - 55 U/L 32 32 25   PROCEDURE  Echo 10/08/17 Study Conclusions - Left ventricle: The cavity size was normal. There was moderate   concentric hypertrophy. Systolic function was normal. The   estimated ejection fraction was in the range of 55% to 60%. Wall   motion was normal; there were no regional wall motion   abnormalities. Doppler parameters are consistent with abnormal   left ventricular relaxation (grade 1 diastolic dysfunction).   There was no evidence of elevated ventricular filling pressure by   Doppler parameters. - Aortic valve: Trileaflet; normal thickness leaflets. There was no   regurgitation. - Aortic root: The aortic root was normal in size. - Mitral valve: There was no regurgitation. - Right ventricle: Systolic function was normal. - Right atrium: The atrium was normal in size. - Tricuspid valve: There was no regurgitation. - Pulmonic valve: There was no regurgitation. - Pulmonary arteries: Systolic pressure could not be accurately   estimated. - Inferior vena cava: The vessel was normal in size. - Pericardium, extracardiac: There was no pericardial effusion.  PATHOLOGY  Initial biopsy  Diagnosis 07/02/17 Breast, left, needle core biopsy, 2:00 - INVASIVE DUCTAL CARCINOMA. - SEE COMMENT. Microscopic Comment The carcinoma appears grade III. A breast prognostic profile will be performed and the results reported separately. The results were  called to [facility] on 06/24/2017. (JBK:ecj 06/24/2017) Results: HER2 - NEGATIVE RATIO OF HER2/CEP17 SIGNALS 1.60 AVERAGE HER2 COPY NUMBER PER CELL 3.60 Results: IMMUNOHISTOCHEMICAL AND MORPHOMETRIC ANALYSIS PERFORMED MANUALLY Estrogen Receptor: 0%, NEGATIVE Progesterone Receptor: 0%, NEGATIVE Proliferation Marker Ki67: 90%  Surgical pathology  Diagnosis 07/31/18 1. Lymph node, sentinel, biopsy, left - ONE OF ONE LYMPH NODES NEGATIVE FOR CARCINOMA (0/1). 2. 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 - INVASIVE CARCINOMA IS <0.1 CM OF THE ANTERIOR MARGIN BROADLY. - SEE ONCOLOGY TABLE. 3. Breast, excision, left deep - BENIGN BREAST TISSUE. 4. Breast, excision, left superior - BENIGN BREAST TISSUE. 5. Breast, excision, left medial - BENIGN BREAST TISSUE. 6. Breast, excision, left inferior - BENIGN BREAST TISSUE. 7. Breast, excision, left lateral - BENIGN BREAST TISSUE. 8. Lymph node, sentinel, biopsy - ONE OF ONE LYMPH NODES NEGATIVE FOR CARCINOMA (0/1). 9. Lymph node, sentinel, biopsy - ONE OF ONE LYMPH NODES NEGATIVE FOR CARCINOMA (0/1). 10. Lymph node, sentinel, biopsy - ONE OF ONE LYMPH NODES NEGATIVE FOR CARCINOMA (0/1). 11. Lymph node, sentinel, biopsy - ONE OF ONE LYMPH NODES NEGATIVE FOR CARCINOMA (0/1). Microscopic Comment 2. BREAST, INVASIVE TUMOR Procedure: Left partial mastectomy with additional margins and left sentinel lymph node biopsies. Laterality: Left. 1 of 4 FINAL for Mary Wise, Mary Wise ([id number]) Microscopic Comment(continued) Tumor Size: 4.5 cm. Histologic Type: Invasive ductal carcinoma. Grade: 3 Tubular Differentiation: 2 Nuclear Pleomorphism: 3 Mitotic Count: 3 Ductal Carcinoma in Situ (DCIS): Not identified. Extent of Tumor: Confined to breast parenchyma. Margins: Invasive carcinoma, distance from closest margin: <0.1 cm of anterior margin broadly. Remaining final  margins (parts 3-7) are >1 cm. DCIS, distance from closest margin: N/A. Regional Lymph Nodes: Number of Lymph Nodes Examined: 5 Number of Sentinel Lymph Nodes Examined: 5 Lymph Nodes with Macrometastases: 0 Lymph Nodes with Micrometastases: 0 Lymph Nodes with Isolated Tumor Cells: 0 Breast Prognostic Profile: Performed on biopsy [id number], see below. Will not be repeated. Estrogen Receptor: Negative. Progesterone Receptor: Negative. Her2: Negative (ratio 1.60). Ki-67: 90%. Best tumor block for sendout testing: 2D Pathologic Stage Classification (pTNM, AJCC 8th Edition): Primary Tumor (pT): pT2 Regional Lymph Nodes (pN): pN0 Distant Metastases (pM): pMX Comments: None.  RADIOGRAPHIC STUDIES: I have personally reviewed the radiological images as listed and agreed with the findings in the report. No results found.   PET Scan 10/08/17 IMPRESSION: 1. No evidence breast cancer metastasis on FDG PET-CT scan. 2. Postsurgical change in the LEFT lateral breast and LEFT axilla.  Diagnostic Mammogram and [place] 06/16/17 IMPRESSION: Suspicious mass in the 230 o'clock location of the left breast. Tissue diagnosis is recommended. No evidence for adenopathy.  ASSESSMENT & PLAN:  HAELEE BOLEN 67 y.o. 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  1. 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  Proliferation marker Ki67 at 90% -I previously discussed her breast imaging and surgical pathology results with patient and her daughter in great detail. Her diagnostic mammogram from 06/16/17 revealed a suspicious mass in the 230 o'clock location of the left breast with her initial biopsy confirming invasive ductal carcinoma.  - Pt underwent a left partial mastectomy with sentinel node biopsy with Dr. Constance Haw on 07/31/17. Surgical pathology results revealed invasive ductal  carcinoma, grade III, spanning 4.5 cm. 5 lymph nodes biopsied were negative.  -Given her strong family history of cancer, we recommend her to undergo genetic testing  to ruled out inheritable breast cancer. She agreed  - She is at high risk for recurrence due to her Stage II triple negative disease which is more aggressive than ER PR positive disease. Her estimated risk for distant recurrence is about 30-35% -I strongly recommended her to consider adjuvant chemotherapy to reduce her risk of recurrence. I recommend 2 months of dose dense adriamycin and cytoxan followed by bi-weekly taxol for 2 months. She started chemo AC on 10/12/17. -the goal of therapy is curative -PET from 10/08/17 showed no evidence of metastasis and her ECHO revealed normal heart function, discussed the results with patient. Will order another ECHO once she completes chemo. I previously discussed results with pt.  -She started adjuvant chemotherapy Adriamycin and Cytoxan on 10/12/17, tolerating well overall. She does have significant fatigue after chemo, but able to recover well after a few days.  She completed her 4 cycles of AC on 4/22.  -She started Taxol on 12/08/17. She tolerated moderately well except her weepy eyes and significant bone pain for 4-5 days. She was previously given oxycodone as needed which improved her pain. Will monitor her bone pain.  -Labs reviewed, her WBC dropped to 3 due to Taxol. Adequate for Taxol today. I suspect her counts will drop further. If so I will give growth factor to help her counts.  -I discussed her proceeding with surgery 3-4 weeks after chemo. She will contact Dr. Percival Spanish at [facility] last week of treatment.  -F/u with me on 6/17   2. Anxiety -She takes Xanax PRN but has found herself using it more often since her Breast Cancer diagnosis. -I refilled for her on 09/30/17  3. Large left upper arm lipoma  -She has a large lipoma in the upper arm/should area, she was seen by Dr. Sherlynn Stalls at [facility].  -Dr.  Sherlynn Stalls plan to have surgical resection after she completes chemo, and he will coordinate with her radiation with Dr. Isidore Moos.  4. Genetics -She has strong family history of breast, ovarian cancer and brain tumor, I strongly recommend her to have genetic testing to rule out inheritable genetic syndrome.  She agrees, I referred her -Appointment was rescheduled  5. Insomnia  -She has chronic insomnia, tried trazodone, melatonin, and benadryl in the past. She was historically on Ambien x30 years but a previous provider took her off it. She gets approx 2 hours of sleep with xanax. Regan Rakers reviewed sleep hygiene with her and prescribed remeron on 10/26/17.    6.  Arthralgia and back pain -She states she has been taking hydrocodone occasionally (she gets from ED visits) for 10 years for arthritis. She uses it as needed.  -She has new onset right ankle pain for a fall that she has been taking pain medication Norco 1-2 tabs/day for the past 5 days. She also reports that the chemo has been making her arthritis and body aches worse.  -pt requested refill of her norco on 11/09/2017. I advised her that I would refill for her 1 more time and that she should only use this medication as needed and no more than 2 per day. She voiced good understanding. We discussed narcotic addiction, and I discouraged her to use it for long-term. -Has tramadol at home, I encouraged her to use as needed for pain also. Regan Rakers previously filled Lidocaine patch but was denied for coverage. -I recommend EMLA cream as needed she can use for her knee and back pain, she has refills available.  -She has developed a severe diffuse  body pain after Taxol, resolved after for 5 days.  She required oxycodone as needed for the pain.  Plan -Labs reveiwed and adequate to proceed with Cycle 2 Taxol today  -Lab flush, f/u with Lacie and Taxol on 6/3 -Lab flush, f/u with me and Taxol on 6/17 (last cycle)    No orders of the defined types were  placed in this encounter.  All questions were answered. The patient knows to call the clinic with any problems, questions or concerns. No barriers to learning was detected.  I spent 20 minutes counseling the patient face to face. The total time spent in the appointment was 25 minutes and more than 50% was on counseling and review of test results  This document serves as a record of services personally performed by Truitt Merle, MD. It was created on her behalf by Joslyn Devon, a trained medical scribe. The creation of this record is based on the scribe's personal observations and the provider's statements to them.   I have reviewed the above documentation for accuracy and completeness, and I agree with the above.   Truitt Merle, MD 12/21/17

## 2017-12-21 NOTE — Progress Notes (Signed)
MD holding off on Neulasta at this time due to the bone pain experienced with previous Taxol.  MD adding Zofran to premeds for future cycles. Pt also given a home prescription for Zofran. Pt didn't receive Zofran IV premed today.  Hardie Pulley, PharmD, BCPS, BCOP

## 2017-12-22 ENCOUNTER — Telehealth: Payer: Self-pay | Admitting: Hematology

## 2017-12-22 NOTE — Telephone Encounter (Signed)
No LOS 5/21

## 2018-01-03 NOTE — Progress Notes (Signed)
[place]  [phone] [phone]  Clinic Follow up Note   Patient Care Team: [name], MD as PCP - General (Family Medicine) 01/04/2018  SUMMARY OF ONCOLOGIC HISTORY: Oncology History   Cancer Staging Malignant neoplasm of upper-outer quadrant of left breast in female, estrogen receptor negative ([place]) Staging form: Breast, AJCC 8th Edition - Pathologic stage from 08/19/2017: Stage IIA (pT2, pN0(sn), cM0, G3, ER: Negative, PR: Negative, HER2: Negative) - Signed by Ardath Sax, MD on 09/01/2017       Malignant neoplasm of upper-outer quadrant of left breast in female, estrogen receptor negative ([place])   06/16/2017 Initial Diagnosis    Malignant neoplasm of upper-outer quadrant of left breast in female: Initially presented with left deltoid mass which was consistent with lipoma.  During examination, a mass in the left breast was appreciated by palpation.      06/16/2017 Mammogram    BL Diagnostic Study: Irregular hyperdense mass in the upper outer quadrant of the left breast measuring 3.6 cm without radiographically detectable lymphadenopathy.  Normal appearance of the right breast.      06/16/2017 Breast US    3.2 cm irregular mass located at 2:30 o'clock, no pathological lymphadenopathy.      06/23/2017 Pathology Results    Ultrasound-guided biopsy: Positive for invasive ductal carcinoma, grade 3, ER 0%, PR 0%, HER-2 negative by [facility]      07/31/2017 Surgery    Partial left mastectomy with sentinel lymph node biopsy --Pathology: Invasive ductal carcinoma, measuring 4.5 cm, grade 3.  All margins negative, but the anterior margin less than 0.1 cm.  No surrounding DCIS.  0/5 lymph nodes positive for malignancy      10/08/2017 PET scan    IMPRESSION: 1. No evidence breast cancer metastasis on FDG PET-CT scan. 2. Postsurgical change in the LEFT lateral breast and LEFT axilla.      10/11/2017 -  Chemotherapy    The patient had  DOXOrubicin (ADRIAMYCIN) chemo injection 124 mg, 60 mg/m2 = 124 mg, Intravenous,  Once, 4 of 4 cycles Administration: 124 mg (10/12/2017), 124 mg (10/26/2017), 124 mg (11/09/2017), 124 mg (11/23/2017) palonosetron (ALOXI) injection 0.25 mg, 0.25 mg, Intravenous,  Once, 4 of 4 cycles Administration: 0.25 mg (10/12/2017), 0.25 mg (10/26/2017), 0.25 mg (11/09/2017), 0.25 mg (11/23/2017) pegfilgrastim (NEULASTA ONPRO KIT) injection 6 mg, 6 mg, Subcutaneous, Once, 4 of 4 cycles Administration: 6 mg (10/12/2017), 6 mg (10/26/2017), 6 mg (11/09/2017), 6 mg (11/23/2017) cyclophosphamide (CYTOXAN) 1,240 mg in sodium chloride 0.9 % 250 mL chemo infusion, 600 mg/m2 = 1,240 mg, Intravenous,  Once, 4 of 4 cycles Administration: 1,240 mg (10/12/2017), 1,240 mg (10/26/2017), 1,240 mg (11/09/2017), 1,240 mg (11/23/2017) PACLitaxel (TAXOL) 360 mg in sodium chloride 0.9 % 500 mL chemo infusion (</= 48m/m2), 175 mg/m2 = 360 mg (100 % of original dose 175 mg/m2), Intravenous,  Once, 3 of 4 cycles Dose modification: 175 mg/m2 (original dose 175 mg/m2, Cycle 6, Reason: Provider Judgment), 150 mg/m2 (original dose 175 mg/m2, Cycle 9, Reason: Provider Judgment, Comment: telephone order from Dr FBurr Medico Administration: 360 mg (12/08/2017), 360 mg (12/21/2017) ondansetron (ZOFRAN) 8 mg in sodium chloride 0.9 % 50 mL IVPB, , Intravenous,  Once, 2 of 3 cycles fosaprepitant (EMEND) 150 mg, dexamethasone (DECADRON) 12 mg in sodium chloride 0.9 % 145 mL IVPB, , Intravenous,  Once, 4 of 4 cycles Administration:  (10/12/2017),  (10/26/2017),  (11/09/2017),  (11/23/2017)  for chemotherapy treatment.       CURRENT THERAPY:  Adjuvant chemotherapy AC  every 2 weeks for 4 cycles 10/12/17-11/23/17, followed by bi-weekly Taxol for 4 cycles starting 12/08/17.    INTERVAL HISTORY: Ms. Bergevin returns for follow up as scheduled prior to cycle 3 chemotherapy. She was very busy cleaning and lifting at her house this weekend and developed what she worries is a tender lump in  her left upper breast. She has neuropathy with sensitivity in her fingers and toes that gradually improves the week after chemo. She has dropped a couple things but still able to put earrings in, button clothes, and function normally but requiring more concentration. Occasionally walks with a cane, takes more time to stand up and begin movement; no fall.   REVIEW OF SYSTEMS:   Constitutional: Denies fatigue, fevers, chills or abnormal weight loss Eyes: Denies blurriness of vision Ears, nose, mouth, throat, and face: Denies mucositis or sore throat Respiratory: Denies dyspnea or wheezes (+) asthma, stable  Cardiovascular: Denies palpitation, chest discomfort or lower extremity swelling Gastrointestinal:  Denies nausea, vomiting, diarrhea, heartburn or change in bowel habits (+) normal BM with laxatives PRN  Skin: Denies abnormal skin rashes Lymphatics: Denies new lymphadenopathy or easy bruising Neurological:Denies or new weaknesses (+) neuropathy with sensitivity to fingers and toes  Behavioral/Psych: Mood is stable, no new changes  Breast: (+) tender possible lump to left upper breast  All other systems were reviewed with the patient and are negative.  MEDICAL HISTORY:  Past Medical History:  Diagnosis Date  . Anxiety   . Asthma   . Cancer [facility])    left breast cancer  . Hemorrhoids   . Lipoma of left shoulder   . Migraine   . Migraine   . 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 ABDOMINAL HYSTERECTOMY    . APPENDECTOMY    . CESAREAN SECTION    . CHOLECYSTECTOMY    . ERCP    . IR FLUORO GUIDE PORT INSERTION RIGHT  10/05/2017  . IR US GUIDE VASC ACCESS RIGHT  10/05/2017  . LIVER SURGERY    . PARTIAL MASTECTOMY WITH AXILLARY SENTINEL LYMPH NODE BIOPSY Left 07/31/2017   Procedure: LEFT PARTIAL MASTECTOMY WITH AXILLARY SENTINEL LYMPH NODE BIOPSY;  Surgeon: Virl Cagey, MD;  Location: [facility];   Service: General;  Laterality: Left;    I have reviewed the social history and family history with the patient and they are unchanged from previous note.  ALLERGIES:  is allergic to onion; penicillins; sulfa antibiotics; yellow dye; and clindamycin/lincomycin.  MEDICATIONS:  Current Outpatient Medications  Medication Sig Dispense Refill  . albuterol (PROVENTIL HFA;VENTOLIN HFA) 108 (90 Base) MCG/ACT inhaler Inhale 2 puffs into the lungs every 6 (six) hours as needed for wheezing or shortness of breath. 6.7 g 2  . ALPRAZolam (XANAX) 1 MG tablet Take 1 tablet (1 mg total) by mouth daily as needed for anxiety. 30 tablet 0  . Bioflavonoid Products (BIOFLEX) TABS Take 1 tablet by mouth daily.    Marland Kitchen HYDROcodone-acetaminophen (NORCO) 5-325 MG tablet Take 1 tablet by mouth every 6 (six) hours as needed for moderate pain. 30 tablet 0  . Hypromellose (ARTIFICIAL TEARS OP) Apply to eye 2 (two) times daily. Doesn't do drops in eyes does as a wash to flush eyes out    . lidocaine (LIDODERM) 5 % Place 0.5-1 patches onto the skin daily as needed (pain). Remove & Discard patch within 12 hours or as directed by MD  30 patch 2  . lidocaine-prilocaine (EMLA) cream Apply 1 application topically as needed. 30 g 2  . Menthol, Topical Analgesic, (BIOFREEZE EX) Apply 1 application topically as needed (pain).    . mirtazapine (REMERON) 7.5 MG tablet Take 1 tablet (7.5 mg total) by mouth at bedtime. 30 tablet 0  . oxyCODONE (OXY IR/ROXICODONE) 5 MG immediate release tablet Take 1 tablet (5 mg total) by mouth every 4 (four) hours as needed for severe pain. 20 tablet 0  . ibuprofen (ADVIL,MOTRIN) 800 MG tablet Take 800 mg by mouth every 8 (eight) hours as needed.    . ondansetron (ZOFRAN) 8 MG tablet Take 1 tablet (8 mg total) by mouth 2 (two) times daily as needed. Start on the third day after chemotherapy. (Patient not taking: Reported on 01/04/2018) 30 tablet 3  . prochlorperazine (COMPAZINE) 10 MG tablet Take 1 tablet (10  mg total) by mouth every 6 (six) hours as needed for nausea or vomiting. (Patient not taking: Reported on 01/04/2018) 30 tablet 2   No current facility-administered medications for this visit.    Facility-Administered Medications Ordered in Other Visits  Medication Dose Route Frequency Provider Last Rate Last Dose  . diphenhydrAMINE (BENADRYL) injection 50 mg  50 mg Intravenous Once Truitt Merle, MD      . famotidine (PEPCID) IVPB 20 mg premix  20 mg Intravenous Once Truitt Merle, MD      . heparin lock flush 100 unit/mL  500 Units Intracatheter Once PRN Truitt Merle, MD      . ondansetron (ZOFRAN) 8 mg, dexamethasone (DECADRON) 20 mg in sodium chloride 0.9 % 50 mL IVPB   Intravenous Once Cira Rue K, NP      . PACLitaxel (TAXOL) 300 mg in sodium chloride 0.9 % 250 mL chemo infusion (</= 15m/m2)  300 mg Intravenous Once FTruitt Merle MD      . sodium chloride flush (NS) 0.9 % injection 10 mL  10 mL Intracatheter PRN FTruitt Merle MD        PHYSICAL EXAMINATION: ECOG PERFORMANCE STATUS: 1 - Symptomatic but completely ambulatory  Vitals:   01/04/18 1017  BP: 139/89  Pulse: 98  Resp: 19  Temp: 98 F (36.7 C)  SpO2: 99%   Filed Weights   01/04/18 1017  Weight: 188 lb (85.3 kg)    GENERAL:alert, no distress and comfortable SKIN: no rashes or significant lesions EYES: normal, Conjunctiva are pink and non-injected, sclera clear OROPHARYNX:no thrush or ulcers   LYMPH:  no palpable cervical, supraclavicular, or axillary lymphadenopathy LUNGS: clear to auscultation with normal breathing effort HEART: regular rate & rhythm and no murmurs and no lower extremity edema ABDOMEN: abdomen soft, non-tender and normal bowel sounds Musculoskeletal:no cyanosis of digits and no clubbing  NEURO: alert & oriented x 3 with fluent speech, no focal motor deficits. Moderately decreased vibratory sense to feet per tuning fork exam  BREASTS: inspection shows them to be symmetrical without nipple discharge or  inversion. (+) left breast s/p partial mastectomy. Axillary and breast incisions have healed well. No palpable mass in the left breast or axilla, especially at the area of concern at the upper-central to upper-inner portion of the left breast. PAC without erythema   LABORATORY DATA:  I have reviewed the data as listed CBC Latest Ref Rng & Units 01/04/2018 12/21/2017 12/08/2017  WBC 3.9 - 10.3 K/uL 2.7(L) 3.0(L) 4.0  Hemoglobin 11.6 - 15.9 g/dL 11.3(L) 10.5(L) 10.4(L)  Hematocrit 34.8 - 46.6 % 33.7(L) 32.4(L) 31.0(L)  Platelets 145 -  400 K/uL 294 336 185     CMP Latest Ref Rng & Units 01/04/2018 12/21/2017 12/08/2017  Glucose 70 - 140 mg/dL 107 90 93  BUN 7 - 26 mg/dL 12 8 6(L)  Creatinine 0.60 - 1.10 mg/dL 0.80 0.69 0.74  Sodium 136 - 145 mmol/L 142 142 140  Potassium 3.5 - 5.1 mmol/L 3.4(L) 3.4(L) 3.7  Chloride 98 - 109 mmol/L 107 108 106  CO2 22 - 29 mmol/L _0 Calcium 8.4 - 10.4 mg/dL 9.2 9.2 9.0  Total Protein 6.4 - 8.3 g/dL 7.1 6.7 6.5  Total Bilirubin 0.2 - 1.2 mg/dL 0.3 0.2 <0.2(L)  Alkaline Phos 40 - 150 U/L 108 101 111  AST 5 - 34 U/L _1 ALT 0 - 55 U/L 24 32 32    PATHOLOGY  Initial biopsy  Diagnosis 07/02/17 Breast, left, needle core biopsy, 2:00 - INVASIVE DUCTAL CARCINOMA. - SEE COMMENT. Microscopic Comment The carcinoma appears grade III. A breast prognostic profile will be performed and the results reported separately. The results were called to [facility] on 06/24/2017. (JBK:ecj 06/24/2017) Results: HER2 - NEGATIVE RATIO OF HER2/CEP17 SIGNALS 1.60 AVERAGE HER2 COPY NUMBER PER CELL 3.60 Results: IMMUNOHISTOCHEMICAL AND MORPHOMETRIC ANALYSIS PERFORMED MANUALLY Estrogen Receptor: 0%, NEGATIVE Progesterone Receptor: 0%, NEGATIVE Proliferation Marker Ki67: 90%  Surgical pathology  Diagnosis 07/31/18 1. Lymph node, sentinel, biopsy, left - ONE OF ONE LYMPH NODES NEGATIVE FOR CARCINOMA (0/1). 2. Breast, partial mastectomy, left -  INVASIVE DUCTAL CARCINOMA, GRADE 3, SPANNING 4.5 CM. - INVASIVE CARCINOMA IS <0.1 CM OF THE ANTERIOR MARGIN BROADLY. - SEE ONCOLOGY TABLE. 3. Breast, excision, left deep - BENIGN BREAST TISSUE. 4. Breast, excision, left superior - BENIGN BREAST TISSUE. 5. Breast, excision, left medial - BENIGN BREAST TISSUE. 6. Breast, excision, left inferior - BENIGN BREAST TISSUE. 7. Breast, excision, left lateral - BENIGN BREAST TISSUE. 8. Lymph node, sentinel, biopsy - ONE OF ONE LYMPH NODES NEGATIVE FOR CARCINOMA (0/1). 9. Lymph node, sentinel, biopsy - ONE OF ONE LYMPH NODES NEGATIVE FOR CARCINOMA (0/1). 10. Lymph node, sentinel, biopsy - ONE OF ONE LYMPH NODES NEGATIVE FOR CARCINOMA (0/1). 11. Lymph node, sentinel, biopsy - ONE OF ONE LYMPH NODES NEGATIVE FOR CARCINOMA (0/1). Microscopic Comment 2. BREAST, INVASIVE TUMOR Procedure: Left partial mastectomy with additional margins and left sentinel lymph node biopsies. Laterality: Left. 1 of 4 FINAL for JORDANNA, HENDRIE ([id number]) Microscopic Comment(continued) Tumor Size: 4.5 cm. Histologic Type: Invasive ductal carcinoma. Grade: 3 Tubular Differentiation: 2 Nuclear Pleomorphism: 3 Mitotic Count: 3 Ductal Carcinoma in Situ (DCIS): Not identified. Extent of Tumor: Confined to breast parenchyma. Margins: Invasive carcinoma, distance from closest margin: <0.1 cm of anterior margin broadly. Remaining final margins (parts 3-7) are >1 cm. DCIS, distance from closest margin: N/A. Regional Lymph Nodes: Number of Lymph Nodes Examined: 5 Number of Sentinel Lymph Nodes Examined: 5 Lymph Nodes with Macrometastases: 0 Lymph Nodes with Micrometastases: 0 Lymph Nodes with Isolated Tumor Cells: 0 Breast Prognostic Profile: Performed on biopsy [id number], see below. Will not be repeated. Estrogen Receptor: Negative. Progesterone Receptor: Negative. Her2: Negative (ratio 1.60). Ki-67: 90%. Best tumor block for sendout testing: 2D Pathologic  Stage Classification (pTNM, AJCC 8th Edition): Primary Tumor (pT): pT2 Regional Lymph Nodes (pN): pN0 Distant Metastases (pM): pMX Comments: None.    RADIOGRAPHIC STUDIES: I have personally reviewed the radiological images as listed and agreed with the findings in the report. No results found.   ASSESSMENT & PLAN: EVALINE WALTMAN  67 y.o. 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  1. 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  Proliferation marker Ki67 at 90% -Ms. Lohmann is doing well. She completed cycle 2 taxol 175 mg/m2 following 4 cycles adjuvant AC. She is tolerating chemotherapy moderately well, but has developed worsening peripheral neuropathy secondary to chemotherapy. She has some difficulty with tasks and moderate sensory deficit to her feet. I recommend taxol dose reduction. Labs reviewed, ANC 1.4; we reviewed neutropenic precautions. Mild hypokalemia K 3.4, we reviewed K-rich foods which she will increase in her diet. If she develops further or prolonged hypokalemia may requires oral replacement.  -Labs otherwise stable and adequate for treatment, proceed with cycle 3 adjuvant taxol 150 mg/m2 -Breast exam is unremarkable, possible she strained a muscle while moving/lifting this weekend. I recommend symptom management with heat/ice and NSAIDs.  -f/u in 2 weeks with final cycle 4  2. Peripheral neuropathy, G2, secondary to chemotherapy  -She developed worsening peripheral neuropathy after cycle 2, secondary to chemotherapy. She has some difficulty with tasks and moderate sensory deficit to her feet. I recommend taxol dose reduction vs change to weekly taxol at lower dose for 6 more cycles. We discussed neuropathy can be a chronic side effect and might be improved with weekly dosing, she understands.  -Patient prefers to stay on q2 week schedule, will dose  reduce.  -I encouraged her to do cryotherapy during taxol infusion and to begin B complex vitamin for nerve health. I briefly discussed other medications such as cymbalta and gabapentin, she declines for now.   2. Anxiety -Stable, controlled on xanax. I refilled today   3. Large left upper arm lipoma  -Pending surgery at [facility] after completion of adjuvant   4. Genetics -Appt has not been rescheduled   5. Insomnia  -Remeron has not been helpful, she discontinued. She reports getting good rest with xanax   6. Arthralgia and back pain  -Lidocain patch was recently approved.   PLAN -Labs reviewed, proceed with cycle 3 taxol with dose-reduction 150 mg/m2 -Reviewed neutropenic precautions  -Increase K-rich foods  -For neuropathy, start cryotherapy during infusions and B complex vitamin  -Refilled xanax  -Lab, f/u in 2 weeks with final cycle 4 taxol   All questions were answered. The patient knows to call the clinic with any problems, questions or concerns. No barriers to learning was detected. I spent 20 minutes counseling the patient face to face. The total time spent in the appointment was 25 minutes and more than 50% was on counseling and review of test results     Alla Feeling, NP 01/04/18

## 2018-01-04 ENCOUNTER — Inpatient Hospital Stay: Payer: Medicare Other

## 2018-01-04 ENCOUNTER — Inpatient Hospital Stay: Payer: Medicare Other | Attending: Hematology | Admitting: Nurse Practitioner

## 2018-01-04 ENCOUNTER — Telehealth: Payer: Self-pay | Admitting: Nurse Practitioner

## 2018-01-04 ENCOUNTER — Encounter: Payer: Self-pay | Admitting: Nurse Practitioner

## 2018-01-04 VITALS — BP 139/89 | HR 98 | Temp 98.0°F | Resp 19 | Ht 68.0 in | Wt 188.0 lb

## 2018-01-04 DIAGNOSIS — Z5111 Encounter for antineoplastic chemotherapy: Secondary | ICD-10-CM | POA: Insufficient documentation

## 2018-01-04 DIAGNOSIS — T451X5A Adverse effect of antineoplastic and immunosuppressive drugs, initial encounter: Secondary | ICD-10-CM

## 2018-01-04 DIAGNOSIS — Z171 Estrogen receptor negative status [ER-]: Secondary | ICD-10-CM | POA: Diagnosis not present

## 2018-01-04 DIAGNOSIS — Z452 Encounter for adjustment and management of vascular access device: Secondary | ICD-10-CM | POA: Diagnosis not present

## 2018-01-04 DIAGNOSIS — G620 Drug-induced polyneuropathy: Secondary | ICD-10-CM | POA: Diagnosis not present

## 2018-01-04 DIAGNOSIS — E876 Hypokalemia: Secondary | ICD-10-CM | POA: Diagnosis not present

## 2018-01-04 DIAGNOSIS — C50412 Malignant neoplasm of upper-outer quadrant of left female breast: Secondary | ICD-10-CM | POA: Diagnosis not present

## 2018-01-04 DIAGNOSIS — F419 Anxiety disorder, unspecified: Secondary | ICD-10-CM

## 2018-01-04 DIAGNOSIS — Z95828 Presence of other vascular implants and grafts: Secondary | ICD-10-CM

## 2018-01-04 LAB — CMP (CANCER CENTER ONLY)
ALT: 24 U/L (ref 0–55)
ANION GAP: 9 (ref 3–11)
AST: 22 U/L (ref 5–34)
Albumin: 3.8 g/dL (ref 3.5–5.0)
Alkaline Phosphatase: 108 U/L (ref 40–150)
BUN: 12 mg/dL (ref 7–26)
CHLORIDE: 107 mmol/L (ref 98–109)
CO2: 26 mmol/L (ref 22–29)
Calcium: 9.2 mg/dL (ref 8.4–10.4)
Creatinine: 0.8 mg/dL (ref 0.60–1.10)
Glucose, Bld: 107 mg/dL (ref 70–140)
POTASSIUM: 3.4 mmol/L — AB (ref 3.5–5.1)
Sodium: 142 mmol/L (ref 136–145)
Total Bilirubin: 0.3 mg/dL (ref 0.2–1.2)
Total Protein: 7.1 g/dL (ref 6.4–8.3)

## 2018-01-04 LAB — CBC WITH DIFFERENTIAL (CANCER CENTER ONLY)
Basophils Absolute: 0.1 10*3/uL (ref 0.0–0.1)
Basophils Relative: 3 %
Eosinophils Absolute: 0.2 10*3/uL (ref 0.0–0.5)
Eosinophils Relative: 6 %
HEMATOCRIT: 33.7 % — AB (ref 34.8–46.6)
HEMOGLOBIN: 11.3 g/dL — AB (ref 11.6–15.9)
LYMPHS PCT: 27 %
Lymphs Abs: 0.7 10*3/uL — ABNORMAL LOW (ref 0.9–3.3)
MCH: 31 pg (ref 25.1–34.0)
MCHC: 33.6 g/dL (ref 31.5–36.0)
MCV: 92.4 fL (ref 79.5–101.0)
Monocytes Absolute: 0.4 10*3/uL (ref 0.1–0.9)
Monocytes Relative: 13 %
NEUTROS ABS: 1.4 10*3/uL — AB (ref 1.5–6.5)
NEUTROS PCT: 51 %
Platelet Count: 294 10*3/uL (ref 145–400)
RBC: 3.65 MIL/uL — AB (ref 3.70–5.45)
RDW: 17.7 % — ABNORMAL HIGH (ref 11.2–14.5)
WBC: 2.7 10*3/uL — AB (ref 3.9–10.3)

## 2018-01-04 MED ORDER — SODIUM CHLORIDE 0.9 % IV SOLN
300.0000 mg | Freq: Once | INTRAVENOUS | Status: AC
  Administered 2018-01-04: 300 mg via INTRAVENOUS
  Filled 2018-01-04: qty 50

## 2018-01-04 MED ORDER — SODIUM CHLORIDE 0.9% FLUSH
10.0000 mL | INTRAVENOUS | Status: DC | PRN
Start: 2018-01-04 — End: 2018-01-04
  Administered 2018-01-04: 10 mL
  Filled 2018-01-04: qty 10

## 2018-01-04 MED ORDER — SODIUM CHLORIDE 0.9 % IV SOLN: 175.0000 mg/m2 | Freq: Once | INTRAVENOUS | Status: DC

## 2018-01-04 MED ORDER — SODIUM CHLORIDE 0.9 % IV SOLN
Freq: Once | INTRAVENOUS | Status: AC
  Administered 2018-01-04: 11:00:00 via INTRAVENOUS

## 2018-01-04 MED ORDER — ALPRAZOLAM 1 MG PO TABS: 1.0000 mg | ORAL_TABLET | Freq: Every day | ORAL | 0 refills | Status: DC | PRN

## 2018-01-04 MED ORDER — FAMOTIDINE IN NACL 20-0.9 MG/50ML-% IV SOLN
20.0000 mg | Freq: Once | INTRAVENOUS | Status: AC
  Administered 2018-01-04: 20 mg via INTRAVENOUS

## 2018-01-04 MED ORDER — HEPARIN SOD (PORK) LOCK FLUSH 100 UNIT/ML IV SOLN
500.0000 [IU] | Freq: Once | INTRAVENOUS | Status: AC | PRN
  Administered 2018-01-04: 500 [IU]
  Filled 2018-01-04: qty 5

## 2018-01-04 MED ORDER — SODIUM CHLORIDE 0.9% FLUSH
10.0000 mL | Freq: Once | INTRAVENOUS | Status: AC
  Administered 2018-01-04: 10 mL
  Filled 2018-01-04: qty 10

## 2018-01-04 MED ORDER — DIPHENHYDRAMINE HCL 50 MG/ML IJ SOLN
50.0000 mg | Freq: Once | INTRAMUSCULAR | Status: AC
  Administered 2018-01-04: 50 mg via INTRAVENOUS

## 2018-01-04 MED ORDER — SODIUM CHLORIDE 0.9 % IV SOLN
Freq: Once | INTRAVENOUS | Status: AC
  Administered 2018-01-04: 12:00:00 via INTRAVENOUS
  Filled 2018-01-04: qty 4

## 2018-01-04 MED ORDER — ONDANSETRON HCL 40 MG/20ML IJ SOLN: Freq: Once | INTRAMUSCULAR | Status: DC

## 2018-01-04 MED ORDER — SODIUM CHLORIDE 0.9 % IV SOLN: 20.0000 mg | Freq: Once | INTRAVENOUS | Status: DC

## 2018-01-04 MED ORDER — ONDANSETRON HCL 4 MG/2ML IJ SOLN
INTRAMUSCULAR | Status: AC
  Filled 2018-01-04: qty 4

## 2018-01-04 MED ORDER — DIPHENHYDRAMINE HCL 50 MG/ML IJ SOLN
INTRAMUSCULAR | Status: AC
  Filled 2018-01-04: qty 1

## 2018-01-04 MED ORDER — FAMOTIDINE IN NACL 20-0.9 MG/50ML-% IV SOLN
INTRAVENOUS | Status: AC
  Filled 2018-01-04: qty 50

## 2018-01-04 NOTE — Patient Instructions (Signed)

## 2018-01-04 NOTE — Progress Notes (Signed)
01/04/18 @ 1115  Received telephone call from Dr Burr Medico to decrease dose of Taxol from 175 mg/m2 to 150 mg/m2 for today's dose and the next dose.  Order read back to Dr Burr Medico for accuracy and confirmed.  Treatment plan updated to reflect new dose for both cycles. Today's ANC 1.4.  T.O. Dr Lavonda Jumbo, PharmD

## 2018-01-04 NOTE — Telephone Encounter (Signed)
No 6/3 los.  

## 2018-01-04 NOTE — Patient Instructions (Signed)
[facility] Discharge Instructions for Patients Receiving Chemotherapy  Today you received the following chemotherapy agents paclitaxel (Taxol) To help prevent nausea and vomiting after your treatment, we encourage you to take your nausea medication as directed   If you develop nausea and vomiting that is not controlled by your nausea medication, call the clinic.   BELOW ARE SYMPTOMS THAT SHOULD BE REPORTED IMMEDIATELY:  *FEVER GREATER THAN 100.5 F  *CHILLS WITH OR WITHOUT FEVER  NAUSEA AND VOMITING THAT IS NOT CONTROLLED WITH YOUR NAUSEA MEDICATION  *UNUSUAL SHORTNESS OF BREATH  *UNUSUAL BRUISING OR BLEEDING  TENDERNESS IN MOUTH AND THROAT WITH OR WITHOUT PRESENCE OF ULCERS  *URINARY PROBLEMS  *BOWEL PROBLEMS  UNUSUAL RASH Items with * indicate a potential emergency and should be followed up as soon as possible.  Feel free to call the clinic should you have any questions or concerns. The clinic phone number is [phone].  Please show the CHEMO ALERT CARD at check-in to the Emergency Department and triage nurse.   

## 2018-01-06 ENCOUNTER — Other Ambulatory Visit: Payer: Self-pay | Admitting: Hematology

## 2018-01-06 DIAGNOSIS — F419 Anxiety disorder, unspecified: Secondary | ICD-10-CM

## 2018-01-07 ENCOUNTER — Encounter: Payer: Self-pay | Admitting: Family Medicine

## 2018-01-08 ENCOUNTER — Encounter: Payer: Self-pay | Admitting: Family Medicine

## 2018-01-08 NOTE — Telephone Encounter (Signed)
Was refilled by Cira Rue NP on 01/07/2018

## 2018-01-12 ENCOUNTER — Other Ambulatory Visit: Payer: Self-pay | Admitting: Hematology

## 2018-01-12 DIAGNOSIS — F419 Anxiety disorder, unspecified: Secondary | ICD-10-CM

## 2018-01-13 ENCOUNTER — Other Ambulatory Visit: Payer: Self-pay | Admitting: Hematology

## 2018-01-13 DIAGNOSIS — F419 Anxiety disorder, unspecified: Secondary | ICD-10-CM

## 2018-01-15 ENCOUNTER — Other Ambulatory Visit: Payer: Self-pay | Admitting: Hematology

## 2018-01-15 DIAGNOSIS — F419 Anxiety disorder, unspecified: Secondary | ICD-10-CM

## 2018-01-18 ENCOUNTER — Inpatient Hospital Stay: Payer: Medicare Other

## 2018-01-18 ENCOUNTER — Telehealth: Payer: Self-pay | Admitting: *Deleted

## 2018-01-18 ENCOUNTER — Inpatient Hospital Stay (HOSPITAL_BASED_OUTPATIENT_CLINIC_OR_DEPARTMENT_OTHER): Payer: Medicare Other | Admitting: Nurse Practitioner

## 2018-01-18 ENCOUNTER — Telehealth: Payer: Self-pay | Admitting: Nurse Practitioner

## 2018-01-18 ENCOUNTER — Other Ambulatory Visit: Payer: Self-pay | Admitting: Hematology

## 2018-01-18 ENCOUNTER — Encounter: Payer: Self-pay | Admitting: Nurse Practitioner

## 2018-01-18 VITALS — BP 147/94 | HR 76 | Temp 98.2°F | Resp 18 | Ht 68.0 in | Wt 190.0 lb

## 2018-01-18 DIAGNOSIS — F419 Anxiety disorder, unspecified: Secondary | ICD-10-CM

## 2018-01-18 DIAGNOSIS — Z171 Estrogen receptor negative status [ER-]: Secondary | ICD-10-CM | POA: Diagnosis not present

## 2018-01-18 DIAGNOSIS — C50412 Malignant neoplasm of upper-outer quadrant of left female breast: Secondary | ICD-10-CM | POA: Diagnosis not present

## 2018-01-18 DIAGNOSIS — Z452 Encounter for adjustment and management of vascular access device: Secondary | ICD-10-CM | POA: Diagnosis not present

## 2018-01-18 DIAGNOSIS — D1722 Benign lipomatous neoplasm of skin and subcutaneous tissue of left arm: Secondary | ICD-10-CM

## 2018-01-18 DIAGNOSIS — Z5111 Encounter for antineoplastic chemotherapy: Secondary | ICD-10-CM | POA: Diagnosis not present

## 2018-01-18 DIAGNOSIS — Z95828 Presence of other vascular implants and grafts: Secondary | ICD-10-CM

## 2018-01-18 DIAGNOSIS — M898X9 Other specified disorders of bone, unspecified site: Secondary | ICD-10-CM

## 2018-01-18 DIAGNOSIS — G620 Drug-induced polyneuropathy: Secondary | ICD-10-CM

## 2018-01-18 LAB — CBC WITH DIFFERENTIAL (CANCER CENTER ONLY)
BASOS PCT: 2 %
Basophils Absolute: 0.1 10*3/uL (ref 0.0–0.1)
EOS ABS: 0.2 10*3/uL (ref 0.0–0.5)
EOS PCT: 5 %
HCT: 35.5 % (ref 34.8–46.6)
Hemoglobin: 11.7 g/dL (ref 11.6–15.9)
LYMPHS ABS: 1.3 10*3/uL (ref 0.9–3.3)
Lymphocytes Relative: 41 %
MCH: 30.8 pg (ref 25.1–34.0)
MCHC: 32.9 g/dL (ref 31.5–36.0)
MCV: 93.7 fL (ref 79.5–101.0)
Monocytes Absolute: 0.4 10*3/uL (ref 0.1–0.9)
Monocytes Relative: 14 %
Neutro Abs: 1.2 10*3/uL — ABNORMAL LOW (ref 1.5–6.5)
Neutrophils Relative %: 38 %
PLATELETS: 303 10*3/uL (ref 145–400)
RBC: 3.79 MIL/uL (ref 3.70–5.45)
RDW: 16.6 % — ABNORMAL HIGH (ref 11.2–14.5)
WBC: 3.2 10*3/uL — AB (ref 3.9–10.3)

## 2018-01-18 LAB — CMP (CANCER CENTER ONLY)
ALK PHOS: 106 U/L (ref 40–150)
ALT: 28 U/L (ref 0–55)
AST: 29 U/L (ref 5–34)
Albumin: 4 g/dL (ref 3.5–5.0)
Anion gap: 10 (ref 3–11)
BILIRUBIN TOTAL: 0.3 mg/dL (ref 0.2–1.2)
BUN: 10 mg/dL (ref 7–26)
CALCIUM: 9.8 mg/dL (ref 8.4–10.4)
CHLORIDE: 106 mmol/L (ref 98–109)
CO2: 26 mmol/L (ref 22–29)
CREATININE: 0.79 mg/dL (ref 0.60–1.10)
Glucose, Bld: 95 mg/dL (ref 70–140)
Potassium: 3.8 mmol/L (ref 3.5–5.1)
Sodium: 142 mmol/L (ref 136–145)
TOTAL PROTEIN: 7.4 g/dL (ref 6.4–8.3)

## 2018-01-18 MED ORDER — SODIUM CHLORIDE 0.9% FLUSH
10.0000 mL | INTRAVENOUS | Status: DC | PRN
  Administered 2018-01-18: 10 mL
  Filled 2018-01-18: qty 10

## 2018-01-18 MED ORDER — FAMOTIDINE IN NACL 20-0.9 MG/50ML-% IV SOLN
20.0000 mg | Freq: Once | INTRAVENOUS | Status: AC
  Administered 2018-01-18: 20 mg via INTRAVENOUS

## 2018-01-18 MED ORDER — DIPHENHYDRAMINE HCL 50 MG/ML IJ SOLN
50.0000 mg | Freq: Once | INTRAMUSCULAR | Status: AC
  Administered 2018-01-18: 50 mg via INTRAVENOUS

## 2018-01-18 MED ORDER — LORAZEPAM 1 MG PO TABS
ORAL_TABLET | ORAL | Status: AC
  Filled 2018-01-18: qty 1

## 2018-01-18 MED ORDER — SODIUM CHLORIDE 0.9 % IV SOLN
Freq: Once | INTRAVENOUS | Status: AC
  Administered 2018-01-18: 11:00:00 via INTRAVENOUS

## 2018-01-18 MED ORDER — SODIUM CHLORIDE 0.9% FLUSH
10.0000 mL | Freq: Once | INTRAVENOUS | Status: AC
  Administered 2018-01-18: 10 mL
  Filled 2018-01-18: qty 10

## 2018-01-18 MED ORDER — ALPRAZOLAM 1 MG PO TABS: ORAL_TABLET | ORAL | 0 refills | Status: DC

## 2018-01-18 MED ORDER — ALTEPLASE 2 MG IJ SOLR
2.0000 mg | Freq: Once | INTRAMUSCULAR | Status: AC
  Administered 2018-01-18: 2 mg
  Filled 2018-01-18: qty 2

## 2018-01-18 MED ORDER — DIPHENHYDRAMINE HCL 50 MG/ML IJ SOLN
INTRAMUSCULAR | Status: AC
  Filled 2018-01-18: qty 1

## 2018-01-18 MED ORDER — SODIUM CHLORIDE 0.9 % IV SOLN
Freq: Once | INTRAVENOUS | Status: AC
  Administered 2018-01-18: 11:00:00 via INTRAVENOUS
  Filled 2018-01-18: qty 4

## 2018-01-18 MED ORDER — SODIUM CHLORIDE 0.9 % IV SOLN
147.0000 mg/m2 | Freq: Once | INTRAVENOUS | Status: AC
  Administered 2018-01-18: 300 mg via INTRAVENOUS
  Filled 2018-01-18: qty 50

## 2018-01-18 MED ORDER — LORAZEPAM 1 MG PO TABS
0.5000 mg | ORAL_TABLET | Freq: Once | ORAL | Status: AC
  Administered 2018-01-18: 0.5 mg via ORAL

## 2018-01-18 MED ORDER — FAMOTIDINE IN NACL 20-0.9 MG/50ML-% IV SOLN
INTRAVENOUS | Status: AC
  Filled 2018-01-18: qty 50

## 2018-01-18 MED ORDER — HEPARIN SOD (PORK) LOCK FLUSH 100 UNIT/ML IV SOLN
500.0000 [IU] | Freq: Once | INTRAVENOUS | Status: AC | PRN
  Administered 2018-01-18: 500 [IU]
  Filled 2018-01-18: qty 5

## 2018-01-18 MED ORDER — OXYCODONE HCL 5 MG PO TABS: 5.0000 mg | ORAL_TABLET | Freq: Four times a day (QID) | ORAL | 0 refills | Status: DC | PRN

## 2018-01-18 NOTE — Progress Notes (Signed)
Per Cira Rue, NP it is ok to treat with ANC of 1.2

## 2018-01-18 NOTE — Progress Notes (Signed)
Mary Wise  [phone] [phone]  Clinic Follow up Note   Patient Care Team: [name], MD as PCP - General (Family Medicine) 01/18/2018  SUMMARY OF ONCOLOGIC HISTORY: Oncology History   Cancer Staging Malignant neoplasm of upper-outer quadrant of left breast in female, estrogen receptor negative ([place]) Staging form: Breast, AJCC 8th Edition - Pathologic stage from 08/19/2017: Stage IIA (pT2, pN0(sn), cM0, G3, ER: Negative, PR: Negative, HER2: Negative) - Signed by Ardath Sax, MD on 09/01/2017       Malignant neoplasm of upper-outer quadrant of left breast in female, estrogen receptor negative ([place])   06/16/2017 Initial Diagnosis    Malignant neoplasm of upper-outer quadrant of left breast in female: Initially presented with left deltoid mass which was consistent with lipoma.  During examination, a mass in the left breast was appreciated by palpation.      06/16/2017 Mammogram    BL Diagnostic Study: Irregular hyperdense mass in the upper outer quadrant of the left breast measuring 3.6 cm without radiographically detectable lymphadenopathy.  Normal appearance of the right breast.      06/16/2017 Breast US    3.2 cm irregular mass located at 2:30 o'clock, no pathological lymphadenopathy.      06/23/2017 Pathology Results    Ultrasound-guided biopsy: Positive for invasive ductal carcinoma, grade 3, ER 0%, PR 0%, HER-2 negative by [facility]      07/31/2017 Surgery    Partial left mastectomy with sentinel lymph node biopsy --Pathology: Invasive ductal carcinoma, measuring 4.5 cm, grade 3.  All margins negative, but the anterior margin less than 0.1 cm.  No surrounding DCIS.  0/5 lymph nodes positive for malignancy      10/08/2017 PET scan    IMPRESSION: 1. No evidence breast cancer metastasis on FDG PET-CT scan. 2. Postsurgical change in the LEFT lateral breast and LEFT axilla.      10/11/2017 -  Chemotherapy    The patient had  DOXOrubicin (ADRIAMYCIN) chemo injection 124 mg, 60 mg/m2 = 124 mg, Intravenous,  Once, 4 of 4 cycles Administration: 124 mg (10/12/2017), 124 mg (10/26/2017), 124 mg (11/09/2017), 124 mg (11/23/2017) palonosetron (ALOXI) injection 0.25 mg, 0.25 mg, Intravenous,  Once, 4 of 4 cycles Administration: 0.25 mg (10/12/2017), 0.25 mg (10/26/2017), 0.25 mg (11/09/2017), 0.25 mg (11/23/2017) pegfilgrastim (NEULASTA ONPRO KIT) injection 6 mg, 6 mg, Subcutaneous, Once, 4 of 4 cycles Administration: 6 mg (10/12/2017), 6 mg (10/26/2017), 6 mg (11/09/2017), 6 mg (11/23/2017) cyclophosphamide (CYTOXAN) 1,240 mg in sodium chloride 0.9 % 250 mL chemo infusion, 600 mg/m2 = 1,240 mg, Intravenous,  Once, 4 of 4 cycles Administration: 1,240 mg (10/12/2017), 1,240 mg (10/26/2017), 1,240 mg (11/09/2017), 1,240 mg (11/23/2017) PACLitaxel (TAXOL) 360 mg in sodium chloride 0.9 % 500 mL chemo infusion (</= 24m/m2), 175 mg/m2 = 360 mg (100 % of original dose 175 mg/m2), Intravenous,  Once, 4 of 4 cycles Dose modification: 175 mg/m2 (original dose 175 mg/m2, Cycle 6, Reason: Provider Judgment), 150 mg/m2 (original dose 175 mg/m2, Cycle 9, Reason: Provider Judgment, Comment: telephone order from Dr FBurr Medico Administration: 360 mg (12/08/2017), 360 mg (12/21/2017), 300 mg (01/04/2018) ondansetron (ZOFRAN) 8 mg in sodium chloride 0.9 % 50 mL IVPB, , Intravenous,  Once, 3 of 3 cycles Administration:  (01/04/2018) fosaprepitant (EMEND) 150 mg, dexamethasone (DECADRON) 12 mg in sodium chloride 0.9 % 145 mL IVPB, , Intravenous,  Once, 4 of 4 cycles Administration:  (10/12/2017),  (10/26/2017),  (11/09/2017),  (11/23/2017)  for chemotherapy treatment.      CURRENT  THERAPY: Adjuvant chemotherapy AC every 2 weeks for 4 cycles3/11/19-4/22/19, followed by bi-weekly Taxol for 4 cycles starting5/7/19.   INTERVAL HISTORY: Mary Wise returns for final cycle taxol as scheduled. She completed cycle 3 taxol 150 mg/m2 on 01/04/18. She did cryotherapy during and after  infusion. She notes her neuropathy and bone pain improved with dose reduction. She is very anxious today and has a "stress headache." Had to TPA her port and ran out of xanax. Appetite is good. No n/v/c/d. Asthma-related cough is stable, no dyspnea or chest pain.   REVIEW OF SYSTEMS:   Constitutional: Denies fevers, chills or abnormal weight loss Eyes: Denies blurriness of vision Ears, nose, mouth, throat, and face: Denies mucositis or sore throat Respiratory: Denies dyspnea or wheezes (+) stable cough (+) asthma  Cardiovascular: Denies palpitation, chest discomfort or lower extremity swelling Gastrointestinal:  Denies nausea, vomiting, constipation, diarrhea, heartburn or change in bowel habits Skin: Denies abnormal skin rashes Lymphatics: Denies new lymphadenopathy or easy bruising Neurological:Denies new weaknesses (+) neuropathy, improved (+) headache Behavioral/Psych: (+) anxious (+) restless  MSK: (+) bone pain secondary to taxol, improved  All other systems were reviewed with the patient and are negative.  MEDICAL HISTORY:  Past Medical History:  Diagnosis Date  . Anxiety   . Asthma   . Cancer [facility])    left breast cancer  . Hemorrhoids   . Lipoma of left shoulder   . Migraine   . Migraine   . 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 ABDOMINAL HYSTERECTOMY    . APPENDECTOMY    . CESAREAN SECTION    . CHOLECYSTECTOMY    . ERCP    . IR FLUORO GUIDE PORT INSERTION RIGHT  10/05/2017  . IR US GUIDE VASC ACCESS RIGHT  10/05/2017  . LIVER SURGERY    . PARTIAL MASTECTOMY WITH AXILLARY SENTINEL LYMPH NODE BIOPSY Left 07/31/2017   Procedure: LEFT PARTIAL MASTECTOMY WITH AXILLARY SENTINEL LYMPH NODE BIOPSY;  Surgeon: Virl Cagey, MD;  Location: [facility];  Service: General;  Laterality: Left;    I have reviewed the social history and family history with the patient and they are unchanged from  previous note.  ALLERGIES:  is allergic to onion; penicillins; sulfa antibiotics; yellow dye; and clindamycin/lincomycin.  MEDICATIONS:  Current Outpatient Medications  Medication Sig Dispense Refill  . albuterol (PROVENTIL HFA;VENTOLIN HFA) 108 (90 Base) MCG/ACT inhaler Inhale 2 puffs into the lungs every 6 (six) hours as needed for wheezing or shortness of breath. 6.7 g 2  . ALPRAZolam (XANAX) 1 MG tablet TAKE 1 TABLET BY MOUTH ONCE DAILY AS NEEDED FOR ANXIETY 30 tablet 0  . Bioflavonoid Products (BIOFLEX) TABS Take 1 tablet by mouth daily.    Marland Kitchen HYDROcodone-acetaminophen (NORCO) 5-325 MG tablet Take 1 tablet by mouth every 6 (six) hours as needed for moderate pain. 30 tablet 0  . Hypromellose (ARTIFICIAL TEARS OP) Apply to eye 2 (two) times daily. Doesn't do drops in eyes does as a wash to flush eyes out    . ibuprofen (ADVIL,MOTRIN) 800 MG tablet Take 800 mg by mouth every 8 (eight) hours as needed.    . lidocaine (LIDODERM) 5 % Place 0.5-1 patches onto the skin daily as needed (pain). Remove & Discard patch within 12 hours or as directed by MD 30 patch 2  . lidocaine-prilocaine (EMLA) cream Apply 1 application topically as needed. 30 g 2  .  Menthol, Topical Analgesic, (BIOFREEZE EX) Apply 1 application topically as needed (pain).    . mirtazapine (REMERON) 7.5 MG tablet Take 1 tablet (7.5 mg total) by mouth at bedtime. 30 tablet 0  . ondansetron (ZOFRAN) 8 MG tablet Take 1 tablet (8 mg total) by mouth 2 (two) times daily as needed. Start on the third day after chemotherapy. (Patient not taking: Reported on 01/04/2018) 30 tablet 3  . oxyCODONE (OXY IR/ROXICODONE) 5 MG immediate release tablet Take 1 tablet (5 mg total) by mouth every 6 (six) hours as needed for severe pain. 10 tablet 0  . prochlorperazine (COMPAZINE) 10 MG tablet Take 1 tablet (10 mg total) by mouth every 6 (six) hours as needed for nausea or vomiting. (Patient not taking: Reported on 01/04/2018) 30 tablet 2   No current  facility-administered medications for this visit.    Facility-Administered Medications Ordered in Other Visits  Medication Dose Route Frequency Provider Last Rate Last Dose  . heparin lock flush 100 unit/mL  500 Units Intracatheter Once PRN Truitt Merle, MD      . PACLitaxel (TAXOL) 300 mg in sodium chloride 0.9 % 250 mL chemo infusion (</= 39m/m2)  147 mg/m2 (Treatment Plan Recorded) Intravenous Once FTruitt Merle MD 300 mL/hr at 01/18/18 1212 300 mg at 01/18/18 1212  . sodium chloride flush (NS) 0.9 % injection 10 mL  10 mL Intracatheter PRN FTruitt Merle MD        PHYSICAL EXAMINATION: ECOG PERFORMANCE STATUS: 1 - Symptomatic but completely ambulatory  Vitals:   01/18/18 1003  BP: (!) 147/94  Pulse: 76  Resp: 18  Temp: 98.2 F (36.8 C)  SpO2: 100%   Filed Weights   01/18/18 1003  Weight: 190 lb (86.2 kg)    GENERAL:alert, no distress and comfortable SKIN: no rashes or significant lesions EYES: normal, Conjunctiva are pink and non-injected, sclera clear OROPHARYNX:no thrush or ulcers  LYMPH:  no palpable cervical or supraclavicular lymphadenopathy LUNGS: clear to auscultation with normal breathing effort HEART: regular rate & rhythm and no murmurs and no lower extremity edema ABDOMEN:abdomen soft, non-tender and normal bowel sounds Musculoskeletal:no cyanosis of digits and no clubbing. Large left upper arm lipoma NEURO: alert & oriented x 3 with fluent speech Breast: axillary and breast incisions are well healed, complete breast exam deferred  PAC without erythema   LABORATORY DATA:  I have reviewed the data as listed CBC Latest Ref Rng & Units 01/18/2018 01/04/2018 12/21/2017  WBC 3.9 - 10.3 K/uL 3.2(L) 2.7(L) 3.0(L)  Hemoglobin 11.6 - 15.9 g/dL 11.7 11.3(L) 10.5(L)  Hematocrit 34.8 - 46.6 % 35.5 33.7(L) 32.4(L)  Platelets 145 - 400 K/uL 303 294 336     CMP Latest Ref Rng & Units 01/18/2018 01/04/2018 12/21/2017  Glucose 70 - 140 mg/dL 95 107 90  BUN 7 - 26 mg/dL _0 Creatinine 0.60 - 1.10 mg/dL 0.79 0.80 0.69  Sodium 136 - 145 mmol/L 142 142 142  Potassium 3.5 - 5.1 mmol/L 3.8 3.4(L) 3.4(L)  Chloride 98 - 109 mmol/L 106 107 108  CO2 22 - 29 mmol/L _1 Calcium 8.4 - 10.4 mg/dL 9.8 9.2 9.2  Total Protein 6.4 - 8.3 g/dL 7.4 7.1 6.7  Total Bilirubin 0.2 - 1.2 mg/dL 0.3 0.3 0.2  Alkaline Phos 40 - 150 U/L 106 108 101  AST 5 - 34 U/L _2 ALT 0 - 55 U/L 28 24 32   PATHOLOGY  Initial biopsy  Diagnosis 07/02/17  Breast, left, needle core biopsy, 2:00 - INVASIVE DUCTAL CARCINOMA. - SEE COMMENT. Microscopic Comment The carcinoma appears grade III. A breast prognostic profile will be performed and the results reported separately. The results were called to [facility] on 06/24/2017. (JBK:ecj 06/24/2017) Results: HER2 - NEGATIVE RATIO OF HER2/CEP17 SIGNALS 1.60 AVERAGE HER2 COPY NUMBER PER CELL 3.60 Results: IMMUNOHISTOCHEMICAL AND MORPHOMETRIC ANALYSIS PERFORMED MANUALLY Estrogen Receptor: 0%, NEGATIVE Progesterone Receptor: 0%, NEGATIVE Proliferation Marker Ki67: 90%  Surgical pathology  Diagnosis 07/31/18 1. Lymph node, sentinel, biopsy, left - ONE OF ONE LYMPH NODES NEGATIVE FOR CARCINOMA (0/1). 2. 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 - INVASIVE CARCINOMA IS <0.1 CM OF THE ANTERIOR MARGIN BROADLY. - SEE ONCOLOGY TABLE. 3. Breast, excision, left deep - BENIGN BREAST TISSUE. 4. Breast, excision, left superior - BENIGN BREAST TISSUE. 5. Breast, excision, left medial - BENIGN BREAST TISSUE. 6. Breast, excision, left inferior - BENIGN BREAST TISSUE. 7. Breast, excision, left lateral - BENIGN BREAST TISSUE. 8. Lymph node, sentinel, biopsy - ONE OF ONE LYMPH NODES NEGATIVE FOR CARCINOMA (0/1). 9. Lymph node, sentinel, biopsy - ONE OF ONE LYMPH NODES NEGATIVE FOR CARCINOMA (0/1). 10. Lymph node, sentinel, biopsy - ONE OF ONE LYMPH NODES NEGATIVE FOR  CARCINOMA (0/1). 11. Lymph node, sentinel, biopsy - ONE OF ONE LYMPH NODES NEGATIVE FOR CARCINOMA (0/1). Microscopic Comment 2. BREAST, INVASIVE TUMOR Procedure: Left partial mastectomy with additional margins and left sentinel lymph node biopsies. Laterality: Left. 1 of 4 FINAL for CARLEY, GLENDENNING ([id number]) Microscopic Comment(continued) Tumor Size: 4.5 cm. Histologic Type: Invasive ductal carcinoma. Grade: 3 Tubular Differentiation: 2 Nuclear Pleomorphism: 3 Mitotic Count: 3 Ductal Carcinoma in Situ (DCIS): Not identified. Extent of Tumor: Confined to breast parenchyma. Margins: Invasive carcinoma, distance from closest margin: <0.1 cm of anterior margin broadly. Remaining final margins (parts 3-7) are >1 cm. DCIS, distance from closest margin: N/A. Regional Lymph Nodes: Number of Lymph Nodes Examined: 5 Number of Sentinel Lymph Nodes Examined: 5 Lymph Nodes with Macrometastases: 0 Lymph Nodes with Micrometastases: 0 Lymph Nodes with Isolated Tumor Cells: 0 Breast Prognostic Profile: Performed on biopsy [id number], see below. Will not be repeated. Estrogen Receptor: Negative. Progesterone Receptor: Negative. Her2: Negative (ratio 1.60). Ki-67: 90%. Best tumor block for sendout testing: 2D Pathologic Stage Classification (pTNM, AJCC 8th Edition): Primary Tumor (pT): pT2 Regional Lymph Nodes (pN): pN0 Distant Metastases (pM): pMX Comments: None.   RADIOGRAPHIC STUDIES: I have personally reviewed the radiological images as listed and agreed with the findings in the report. No results found.   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  1. 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 Proliferation marker Ki67 at 90% 2. Peripheral neuropathy, G2, secondary to chemotherapy  3. Anxiety 3. Large left  upper arm lipoma 4. Genetics 5. Insomnia 6. Arthralgia and back pain   Mary Wise appears anxious during today's exam but medically stable. She completed 3 cycles bi-weekly taxol on 01/04/18 following 4 cycles adjuvant AC with onpro. She tolerated reduced-dose taxol cycle 3 moderately well, with improved bone pain and neuropathy. However, she has slightly worsened neutropenia, ANC 1.2. She is afebrile, physical exam is unremarkable. Labs otherwise adequate to proceed with 4th final cycle taxol. I recommend granix x1 in 1 week, given her upcoming surgery for left arm lipoma. We reviewed potential  for bone pain; she has had severe bone pain with taxol in general. I refilled oxycodone #10 tabs for short term use for anticipated GCSF-related pain. I discussed with her I do not plan to refill, she understands.   She does not have a surgical date yet. Will communicate with the team to plan accordingly. In the meantime, I will refer her back to Dr. Isidore Moos to plan adjuvant radiation. She has been referred to genetics but has not been seen yet, I encouraged her to schedule that appointment.   For her anxiety, she ran out of xanax and had difficulty with PAC today. She is visibly anxious. I will give 0.5 mg ativan po x1 in infusion and refill xanax. She plans to call new PCP near her home, I encouraged her to establish with new PCP to continue these refills.   PLAN: -Labs reviewed, ANC 1.2; proceed with final cycle 4 taxol 150 mg/m2 -Ativan 0.5 mg po once in infusion for anxiety  -Lab, granix in 1 week -R/s genetics appointment, consolidate with other appointments if possible  -Refill xanax for anxiety, oxycodone for bone pain -Lipoma surgery in 3-4 weeks per Dr. Sherlynn Stalls at [facility] -Refer back to Dr. Isidore Moos for adjuvant radiation -Discuss plan of care with multidisciplinary team   All questions were answered. The patient knows to call the clinic with any problems, questions or concerns. No barriers to learning  was detected. I spent 20 minutes counseling the patient face to face. The total time spent in the appointment was 25 minutes and more than 50% was on counseling and review of test results     Alla Feeling, NP 01/18/18

## 2018-01-18 NOTE — Telephone Encounter (Signed)
Gave pt avs and calendar with appts per 6/17 los.  °

## 2018-01-18 NOTE — Telephone Encounter (Signed)
-----   Message from Eppie Gibson, MD sent at 01/18/2018  2:09 PM EDT ----- Threasa Beards, please arrange f/u with me in 79mo. Sande Rives, please let me know if you get word re: when surgery will be. Hopefully this will be complete by the time she sees me so we can figure out timing of RT... Thanks, Sarah ----- Message ----- From: Alla Feeling, NP Sent: 01/18/2018   1:10 PM To: Eppie Gibson, MD, Jarvis Morgan, RN, #  Dr. Isidore Moos,  This patient completed adjuvant chemo today. I plan to bring her back in 1 week for granix. Could you please see her back for RT.   She wants to have large left arm lipoma removed asap, per Dr. Sher[facility]nn Stalls at [facility]. She does not have a surgical date yet. Dr. Burr Medico previously recommended waiting 3-4 weeks after chemo, patient is aware.   Finnian Husted, can you please reach out to Dr. Norman Herrlich office at [facility] and let them know she has completed chemo. Perhaps they can give you a tentative date so we can plan the next step in her care.   Thanks, Regan Rakers NP

## 2018-01-18 NOTE — Telephone Encounter (Signed)
Spoke with Dr. Wilhelmenia Blase secretary to inform her of below info.  Informed her that both Dr. Burr Medico and Dr. Isidore Moos would like to know the surgery date for pt.    Gave Dr. Ernestina Penna  Phone number as well as nurses direct phone number for return call. Dr. Norman Herrlich    Phone      [phone].

## 2018-01-18 NOTE — Patient Instructions (Signed)
[facility] Discharge Instructions for Patients Receiving Chemotherapy  Today you received the following chemotherapy agents:  Taxol.  To help prevent nausea and vomiting after your treatment, we encourage you to take your nausea medication as directed.   If you develop nausea and vomiting that is not controlled by your nausea medication, call the clinic.   BELOW ARE SYMPTOMS THAT SHOULD BE REPORTED IMMEDIATELY:  *FEVER GREATER THAN 100.5 F  *CHILLS WITH OR WITHOUT FEVER  NAUSEA AND VOMITING THAT IS NOT CONTROLLED WITH YOUR NAUSEA MEDICATION  *UNUSUAL SHORTNESS OF BREATH  *UNUSUAL BRUISING OR BLEEDING  TENDERNESS IN MOUTH AND THROAT WITH OR WITHOUT PRESENCE OF ULCERS  *URINARY PROBLEMS  *BOWEL PROBLEMS  UNUSUAL RASH Items with * indicate a potential emergency and should be followed up as soon as possible.  Feel free to call the clinic should you have any questions or concerns. The clinic phone number is [phone].  Please show the CHEMO ALERT CARD at check-in to the Emergency Department and triage nurse.   

## 2018-01-25 ENCOUNTER — Inpatient Hospital Stay (HOSPITAL_BASED_OUTPATIENT_CLINIC_OR_DEPARTMENT_OTHER): Payer: Medicare Other | Admitting: Genetic Counselor

## 2018-01-25 ENCOUNTER — Encounter: Payer: Self-pay | Admitting: Genetic Counselor

## 2018-01-25 ENCOUNTER — Inpatient Hospital Stay: Payer: Medicare Other

## 2018-01-25 ENCOUNTER — Ambulatory Visit: Payer: Medicare Other | Admitting: Hematology

## 2018-01-25 DIAGNOSIS — Z803 Family history of malignant neoplasm of breast: Secondary | ICD-10-CM

## 2018-01-25 DIAGNOSIS — Z315 Encounter for genetic counseling: Secondary | ICD-10-CM | POA: Diagnosis not present

## 2018-01-25 DIAGNOSIS — Z171 Estrogen receptor negative status [ER-]: Principal | ICD-10-CM

## 2018-01-25 DIAGNOSIS — Z809 Family history of malignant neoplasm, unspecified: Secondary | ICD-10-CM

## 2018-01-25 DIAGNOSIS — Z801 Family history of malignant neoplasm of trachea, bronchus and lung: Secondary | ICD-10-CM | POA: Diagnosis not present

## 2018-01-25 DIAGNOSIS — Z800 Family history of malignant neoplasm of digestive organs: Secondary | ICD-10-CM

## 2018-01-25 DIAGNOSIS — C50412 Malignant neoplasm of upper-outer quadrant of left female breast: Secondary | ICD-10-CM

## 2018-01-25 DIAGNOSIS — Z808 Family history of malignant neoplasm of other organs or systems: Secondary | ICD-10-CM

## 2018-01-25 DIAGNOSIS — Z8041 Family history of malignant neoplasm of ovary: Secondary | ICD-10-CM | POA: Diagnosis not present

## 2018-01-25 DIAGNOSIS — Z5111 Encounter for antineoplastic chemotherapy: Secondary | ICD-10-CM | POA: Diagnosis not present

## 2018-01-25 DIAGNOSIS — Z452 Encounter for adjustment and management of vascular access device: Secondary | ICD-10-CM | POA: Diagnosis not present

## 2018-01-25 DIAGNOSIS — Z95828 Presence of other vascular implants and grafts: Secondary | ICD-10-CM

## 2018-01-25 LAB — CBC WITH DIFFERENTIAL (CANCER CENTER ONLY)
Basophils Absolute: 0.1 10*3/uL (ref 0.0–0.1)
Basophils Relative: 2 %
EOS ABS: 0.2 10*3/uL (ref 0.0–0.5)
EOS PCT: 5 %
HCT: 33.4 % — ABNORMAL LOW (ref 34.8–46.6)
HEMOGLOBIN: 11 g/dL — AB (ref 11.6–15.9)
LYMPHS ABS: 0.9 10*3/uL (ref 0.9–3.3)
LYMPHS PCT: 24 %
MCH: 31.2 pg (ref 25.1–34.0)
MCHC: 32.9 g/dL (ref 31.5–36.0)
MCV: 94.6 fL (ref 79.5–101.0)
MONOS PCT: 5 %
Monocytes Absolute: 0.2 10*3/uL (ref 0.1–0.9)
Neutro Abs: 2.3 10*3/uL (ref 1.5–6.5)
Neutrophils Relative %: 64 %
PLATELETS: 306 10*3/uL (ref 145–400)
RBC: 3.53 MIL/uL — ABNORMAL LOW (ref 3.70–5.45)
RDW: 14.6 % — ABNORMAL HIGH (ref 11.2–14.5)
WBC Count: 3.6 10*3/uL — ABNORMAL LOW (ref 3.9–10.3)

## 2018-01-25 LAB — CMP (CANCER CENTER ONLY)
ALBUMIN: 3.9 g/dL (ref 3.5–5.0)
ALT: 30 U/L (ref 0–55)
AST: 23 U/L (ref 5–34)
Alkaline Phosphatase: 100 U/L (ref 40–150)
Anion gap: 10 (ref 3–11)
BUN: 12 mg/dL (ref 7–26)
CHLORIDE: 103 mmol/L (ref 98–109)
CO2: 27 mmol/L (ref 22–29)
CREATININE: 0.77 mg/dL (ref 0.60–1.10)
Calcium: 9.3 mg/dL (ref 8.4–10.4)
GFR, Est AFR Am: >60 mL/min (ref >60)
GFR, Estimated: >60 mL/min (ref >60)
Glucose, Bld: 99 mg/dL (ref 70–140)
POTASSIUM: 3.6 mmol/L (ref 3.5–5.1)
SODIUM: 140 mmol/L (ref 136–145)
Total Bilirubin: <0.2 mg/dL — ABNORMAL LOW (ref 0.2–1.2)
Total Protein: 7.1 g/dL (ref 6.4–8.3)

## 2018-01-25 MED ORDER — TBO-FILGRASTIM 300 MCG/0.5ML ~~LOC~~ SOSY
300.0000 ug | PREFILLED_SYRINGE | Freq: Once | SUBCUTANEOUS | Status: AC
  Administered 2018-01-25: 300 ug via SUBCUTANEOUS

## 2018-01-25 MED ORDER — TBO-FILGRASTIM 300 MCG/0.5ML ~~LOC~~ SOSY
PREFILLED_SYRINGE | SUBCUTANEOUS | Status: AC
  Filled 2018-01-25: qty 0.5

## 2018-01-25 NOTE — Patient Instructions (Signed)
Tbo-Filgrastim injection What is this medicine? TBO-FILGRASTIM (T B O fil GRA stim) is a granulocyte colony-stimulating factor that stimulates the growth of neutrophils, a type of white blood cell important in the body's fight against infection. It is used to reduce the incidence of fever and infection in patients with certain types of cancer who are receiving chemotherapy that affects the bone marrow. This medicine may be used for other purposes; ask your health care provider or pharmacist if you have questions. COMMON BRAND NAME(S): Granix What should I tell my health care provider before I take this medicine? They need to know if you have any of these conditions: -bone scan or tests planned -kidney disease -sickle cell anemia -an unusual or allergic reaction to tbo-filgrastim, filgrastim, pegfilgrastim, other medicines, foods, dyes, or preservatives -pregnant or trying to get pregnant -breast-feeding How should I use this medicine? This medicine is for injection under the skin. If you get this medicine at home, you will be taught how to prepare and give this medicine. Refer to the Instructions for Use that come with your medication packaging. Use exactly as directed. Take your medicine at regular intervals. Do not take your medicine more often than directed. It is important that you put your used needles and syringes in a special sharps container. Do not put them in a trash can. If you do not have a sharps container, call your pharmacist or healthcare provider to get one. Talk to your pediatrician regarding the use of this medicine in children. Special care may be needed. Overdosage: If you think you have taken too much of this medicine contact a poison control center or emergency room at once. NOTE: This medicine is only for you. Do not share this medicine with others. What if I miss a dose? It is important not to miss your dose. Call your doctor or health care professional if you miss a  dose. What may interact with this medicine? This medicine may interact with the following medications: -medicines that may cause a release of neutrophils, such as lithium This list may not describe all possible interactions. Give your health care provider a list of all the medicines, herbs, non-prescription drugs, or dietary supplements you use. Also tell them if you smoke, drink alcohol, or use illegal drugs. Some items may interact with your medicine. What should I watch for while using this medicine? You may need blood work done while you are taking this medicine. What side effects may I notice from receiving this medicine? Side effects that you should report to your doctor or health care professional as soon as possible: -allergic reactions like skin rash, itching or hives, swelling of the face, lips, or tongue -blood in the urine -dark urine -dizziness -fast heartbeat -feeling faint -shortness of breath or breathing problems -signs and symptoms of infection like fever or chills; cough; or sore throat -signs and symptoms of kidney injury like trouble passing urine or change in the amount of urine -stomach or side pain, or pain at the shoulder -sweating -swelling of the legs, ankles, or abdomen -tiredness Side effects that usually do not require medical attention (report to your doctor or health care professional if they continue or are bothersome): -bone pain -headache -muscle pain -vomiting This list may not describe all possible side effects. Call your doctor for medical advice about side effects. You may report side effects to FDA at 1-800-FDA-1088. Where should I keep my medicine? Keep out of the reach of children. Store in a refrigerator between   2 and 8 degrees C (36 and 46 degrees F). Keep in carton to protect from light. Throw away this medicine if it is left out of the refrigerator for more than 5 consecutive days. Throw away any unused medicine after the expiration  date. NOTE: This sheet is a summary. It may not cover all possible information. If you have questions about this medicine, talk to your doctor, pharmacist, or health care provider.  2018 Elsevier/Gold Standard (2015-09-10 19:07:04)  

## 2018-01-25 NOTE — Progress Notes (Signed)
      Cancer Genetics Clinic      Initial Visit   Patient Name: Mary Wise Patient DOB: 03/02/1951 Patient Age: 67 y.o. Encounter Date: 01/25/2018  Referring Provider: Yan Feng, MD  Primary Care Provider: Dixon, Mary B, PA-C  Reason for Visit: Evaluate for hereditary susceptibility to cancer    Assessment and Plan:  . [name]'s personal history of triple negative breast cancer warrants a genetics evaluation. While she has many cancers in her family, she also has a very large family with many unaffected individuals.  . Testing is recommended to determine whether she has a pathogenic mutation that will impact her screening and risk-reduction for cancer. A negative result will be reassuring.  . [name] wished to pursue genetic testing and a blood sample will be sent for analysis of the 83 genes on [facility]'s Multi-Cancer panel (ALK, APC, ATM, AXIN2, BAP1, BARD1, BLM, BMPR1A, BRCA1, BRCA2, BRIP1, CASR, CDC73, CDH1, CDK4, CDKN1B, CDKN1C, CDKN2A, CEBPA, CHEK2, CTNNA1, DICER1, DIS3L2, EGFR, EPCAM, FH, FLCN, GATA2, GPC3, GREM1, HOXB13, HRAS, KIT, MAX, MEN1, MET, MITF, MLH1, MSH2, MSH3, MSH6, MUTYH, NBN, NF1, NF2, NTHL1, PALB2, PDGFRA, PHOX2B, PMS2, POLD1, POLE, POT1, PRKAR1A, PTCH1, PTEN, RAD50, RAD51C, RAD51D, RB1, RECQL4, RET, RUNX1, SDHA, SDHAF2, SDHB, SDHC, SDHD, SMAD4, SMARCA4, SMARCB1, SMARCE1, STK11, SUFU, TERC, TERT, TMEM127, TP53, TSC1, TSC2, VHL, WRN, WT1).   . Results should be available in approximately 2-3 weeks, at which point we will contact her and address implications for her as well as address genetic testing for at-risk family members, if needed.     Dr. Feng was available for questions concerning this case. Total time spent by me in face-to-face counseling was approximately 30 minutes.   _____________________________________________________________________   History of Present Illness: Ms. Mary Wise, a 67 y.o. female, is being seen at the [facility] due to a personal and family history of cancer. She presents to clinic today with a friend to discuss the possibility of a hereditary predisposition to cancer and discuss whether genetic testing is warranted.  Ms. Besancon was diagnosed with breast cancer at the age of 66. She is s/p lumpectomy and chemotherapy.  The breast tumor was ER negative, PR negative, and HER2 negative.  Oncology History   Cancer Staging Malignant neoplasm of upper-outer quadrant of left breast in female, estrogen receptor negative (HCC) Staging form: Breast, AJCC 8th Edition - Pathologic stage from 08/19/2017: Stage IIA (pT2, pN0(sn), cM0, G3, ER: Negative, PR: Negative, HER2: Negative) - Signed by Perlov, Mikhail G, MD on 09/01/2017       Malignant neoplasm of upper-outer quadrant of left breast in female, estrogen receptor negative (HCC)   06/16/2017 Initial Diagnosis    Malignant neoplasm of upper-outer quadrant of left breast in female: Initially presented with left deltoid mass which was consistent with lipoma.  During examination, a mass in the left breast was appreciated by palpation.      06/16/2017 Mammogram    BL Diagnostic Study: Irregular hyperdense mass in the upper outer quadrant of the left breast measuring 3.6 cm without radiographically detectable lymphadenopathy.  Normal appearance of the right breast.      06/16/2017 Breast US    3.2 cm irregular mass located at 2:30 o'clock, no pathological lymphadenopathy.      06/23/2017 Pathology Results    Ultrasound-guided biopsy: Positive for invasive ductal carcinoma, grade 3, ER 0%, PR 0%, HER-2 negative by IHC      07/31/2017 Surgery      Partial left mastectomy with sentinel lymph node biopsy --Pathology: Invasive ductal carcinoma, measuring 4.5 cm, grade 3.  All margins negative, but the anterior margin less than 0.1 cm.  No surrounding DCIS.  0/5 lymph nodes positive for malignancy      10/08/2017 PET scan    IMPRESSION: 1.  No evidence breast cancer metastasis on FDG PET-CT scan. 2. Postsurgical change in the LEFT lateral breast and LEFT axilla.      10/11/2017 -  Chemotherapy    The patient had DOXOrubicin (ADRIAMYCIN) chemo injection 124 mg, 60 mg/m2 = 124 mg, Intravenous,  Once, 4 of 4 cycles Administration: 124 mg (10/12/2017), 124 mg (10/26/2017), 124 mg (11/09/2017), 124 mg (11/23/2017) palonosetron (ALOXI) injection 0.25 mg, 0.25 mg, Intravenous,  Once, 4 of 4 cycles Administration: 0.25 mg (10/12/2017), 0.25 mg (10/26/2017), 0.25 mg (11/09/2017), 0.25 mg (11/23/2017) pegfilgrastim (NEULASTA ONPRO KIT) injection 6 mg, 6 mg, Subcutaneous, Once, 4 of 4 cycles Administration: 6 mg (10/12/2017), 6 mg (10/26/2017), 6 mg (11/09/2017), 6 mg (11/23/2017) cyclophosphamide (CYTOXAN) 1,240 mg in sodium chloride 0.9 % 250 mL chemo infusion, 600 mg/m2 = 1,240 mg, Intravenous,  Once, 4 of 4 cycles Administration: 1,240 mg (10/12/2017), 1,240 mg (10/26/2017), 1,240 mg (11/09/2017), 1,240 mg (11/23/2017) PACLitaxel (TAXOL) 360 mg in sodium chloride 0.9 % 500 mL chemo infusion (</= 67m/m2), 175 mg/m2 = 360 mg (100 % of original dose 175 mg/m2), Intravenous,  Once, 4 of 4 cycles Dose modification: 175 mg/m2 (original dose 175 mg/m2, Cycle 6, Reason: Provider Judgment), 150 mg/m2 (original dose 175 mg/m2, Cycle 9, Reason: Provider Judgment, Comment: telephone order from Dr FBurr Medico Administration: 360 mg (12/08/2017), 360 mg (12/21/2017), 300 mg (01/04/2018), 300 mg (01/18/2018) ondansetron (ZOFRAN) 8 mg in sodium chloride 0.9 % 50 mL IVPB, , Intravenous,  Once, 3 of 3 cycles Administration:  (01/04/2018),  (01/18/2018) fosaprepitant (EMEND) 150 mg, dexamethasone (DECADRON) 12 mg in sodium chloride 0.9 % 145 mL IVPB, , Intravenous,  Once, 4 of 4 cycles Administration:  (10/12/2017),  (10/26/2017),  (11/09/2017),  (11/23/2017)  for chemotherapy treatment.        Past Medical History:  Diagnosis Date  . Anxiety   . Asthma   . Cancer ([facility]    left breast  cancer  . Family history of cancer   . Hemorrhoids   . Lipoma of left shoulder   . Migraine   . Migraine   . 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 ABDOMINAL HYSTERECTOMY    . APPENDECTOMY    . CESAREAN SECTION    . CHOLECYSTECTOMY    . ERCP    . IR FLUORO GUIDE PORT INSERTION RIGHT  10/05/2017  . IR [place] VASC ACCESS RIGHT  10/05/2017  . LIVER SURGERY    . PARTIAL MASTECTOMY WITH AXILLARY SENTINEL LYMPH NODE BIOPSY Left 07/31/2017   Procedure: LEFT PARTIAL MASTECTOMY WITH AXILLARY SENTINEL LYMPH NODE BIOPSY;  Surgeon: BVirl Cagey MD;  Location: [facility];  Service: General;  Laterality: Left;    Social History   Socioeconomic History  . Marital status: Single    Spouse name: Not on file  . Number of children: Not on file  . Years of education: Not on file  . Highest education level: Not on file  Occupational History  . Not on file  Social Needs  . Financial resource strain: Not on file  . Food insecurity:    Worry: Not on  file    Inability: Not on file  . Transportation needs:    Medical: Not on file    Non-medical: Not on file  Tobacco Use  . Smoking status: Former Smoker    Packs/day: 0.25    Years: 9.00    Pack years: 2.25    Last attempt to quit: 1982    Years since quitting: 37.5  . Smokeless tobacco: Never Used  Substance and Sexual Activity  . Alcohol use: No    Comment: social   . Drug use: No  . Sexual activity: Never  Lifestyle  . Physical activity:    Days per week: Not on file    Minutes per session: Not on file  . Stress: Not on file  Relationships  . Social connections:    Talks on phone: Not on file    Gets together: Not on file    Attends religious service: Not on file    Active member of club or organization: Not on file    Attends meetings of clubs or organizations: Not on file    Relationship status: Not on file  Other Topics Concern  . Not on file  Social  History Narrative   Lives in Assisted Living on [address].      Family History:  During the visit, a 4-generation pedigree was obtained. Family tree will be scanned in the Media tab in Epic  Significant diagnoses include the following:  Family History  Problem Relation Age of Onset  . Cancer Mother 70       unk. primary; deceased 81  . Cancer Father 81       brain cancer; deceased 85  . Ovarian cancer Sister 61       deceased 61  . Diabetes Brother   . Breast cancer Maternal Grandmother        dx 40s; deceased 84  . Cancer Other        "female ca"  . Diabetes Sister   . Diabetes Sister   . Diabetes Sister   . Cancer Paternal Uncle        brain cancer   . Cancer Paternal Grandmother        "female ca"  . Cancer Paternal Grandfather        lung cancer  . Cancer Paternal Uncle        brain tumor   . Cancer Paternal Uncle        brain tumor   . Stomach cancer Maternal Aunt 87       deceased 89  . Cancer Paternal Aunt        ink. primary in 2 aunts  . Melanoma Other   . Lung cancer Maternal Aunt   . Stomach cancer Cousin        2 sons of an unaffected maternal uncle    Additionally, Ms. Alguire has one daughter (age 42). She has 3 sisters and a brother in addition to her sister noted above. Her mother had 17 siblings. Her father had 13 siblings.  Ms. Cashman's ancestry is Caucasian - NOS. There is no known Jewish ancestry and no consanguinity.  Discussion: We reviewed the characteristics, features and inheritance patterns of hereditary cancer syndromes. We discussed her risk of harboring a mutation in the context of her personal and family history. We discussed the process of genetic testing, insurance coverage and implications of results: positive, negative and variant of unknown significance (VUS).    Ms. Ostrovsky's questions were answered to   her satisfaction today and she is welcome to call with any additional questions or concerns. Thank you for the referral and  allowing [place] to share in the care of your patient.    Steele Berg, MS, [place] Certified Genetic Counselor phone: [phone] [email or web address]

## 2018-01-29 ENCOUNTER — Telehealth: Payer: Self-pay | Admitting: Hematology

## 2018-01-29 NOTE — Telephone Encounter (Signed)
Left message for patient to call back and r/s appts per 6/27 sch message.

## 2018-02-01 ENCOUNTER — Encounter: Payer: Self-pay | Admitting: Physician Assistant

## 2018-02-01 ENCOUNTER — Ambulatory Visit (INDEPENDENT_AMBULATORY_CARE_PROVIDER_SITE_OTHER): Payer: Medicare Other | Admitting: Physician Assistant

## 2018-02-01 ENCOUNTER — Other Ambulatory Visit: Payer: Self-pay

## 2018-02-01 ENCOUNTER — Encounter: Payer: Self-pay | Admitting: Genetic Counselor

## 2018-02-01 ENCOUNTER — Ambulatory Visit: Payer: Self-pay | Admitting: Genetic Counselor

## 2018-02-01 VITALS — BP 134/96 | HR 105 | Temp 98.0°F | Resp 20 | Ht 66.5 in | Wt 191.0 lb

## 2018-02-01 DIAGNOSIS — Z171 Estrogen receptor negative status [ER-]: Secondary | ICD-10-CM | POA: Diagnosis not present

## 2018-02-01 DIAGNOSIS — D1722 Benign lipomatous neoplasm of skin and subcutaneous tissue of left arm: Secondary | ICD-10-CM | POA: Diagnosis not present

## 2018-02-01 DIAGNOSIS — Z0000 Encounter for general adult medical examination without abnormal findings: Secondary | ICD-10-CM

## 2018-02-01 DIAGNOSIS — G43011 Migraine without aura, intractable, with status migrainosus: Secondary | ICD-10-CM | POA: Diagnosis not present

## 2018-02-01 DIAGNOSIS — G4700 Insomnia, unspecified: Secondary | ICD-10-CM

## 2018-02-01 DIAGNOSIS — Z1379 Encounter for other screening for genetic and chromosomal anomalies: Secondary | ICD-10-CM

## 2018-02-01 DIAGNOSIS — C50412 Malignant neoplasm of upper-outer quadrant of left female breast: Secondary | ICD-10-CM | POA: Diagnosis not present

## 2018-02-01 DIAGNOSIS — F419 Anxiety disorder, unspecified: Secondary | ICD-10-CM

## 2018-02-01 DIAGNOSIS — G43909 Migraine, unspecified, not intractable, without status migrainosus: Secondary | ICD-10-CM | POA: Insufficient documentation

## 2018-02-01 HISTORY — DX: Encounter for other screening for genetic and chromosomal anomalies: Z13.79

## 2018-02-01 MED ORDER — ZOLPIDEM TARTRATE 10 MG PO TABS: 10.0000 mg | ORAL_TABLET | Freq: Every evening | ORAL | 5 refills | Status: DC | PRN

## 2018-02-01 MED ORDER — KETOROLAC TROMETHAMINE 60 MG/2ML IM SOLN
60.0000 mg | Freq: Once | INTRAMUSCULAR | Status: AC
Start: 2018-02-01 — End: 2018-02-01
  Administered 2018-02-01: 60 mg via INTRAMUSCULAR

## 2018-02-01 NOTE — Progress Notes (Signed)
Cancer Genetics Clinic       Genetic Test Results    Patient Name: Mary Wise Patient DOB: 08/02/1951 Patient Age: 67 y.o. Encounter Date: 02/01/2018  Referring Provider: Truitt Merle, MD  Primary Care Provider: Orlena Sheldon, PA-C   Mary Wise was called today to discuss genetic test results. Please see the Genetics note from her visit on 01/25/2018 for a detailed discussion of her personal and family history.  Genetic Testing: At the time of Mary Wise's visit, she decided to pursue genetic testing of multiple genes associated with hereditary susceptibility to cancer. Testing included sequencing and deletion/duplication analysis. Testing did not reveal a pathogenic mutation in any of the genes analyzed.  A copy of the genetic test report will be scanned into Epic under the Media tab.  The genes analyzed were the 83 genes on [facility]'s Multi-Cancer panel (ALK, APC, ATM, AXIN2, BAP1, BARD1, BLM, BMPR1A, BRCA1, BRCA2, BRIP1, CASR, CDC73, CDH1, CDK4, CDKN1B, CDKN1C, CDKN2A, CEBPA, CHEK2, CTNNA1, DICER1, DIS3L2, EGFR, EPCAM, FH, FLCN, GATA2, GPC3, GREM1, HOXB13, HRAS, KIT, MAX, MEN1, MET, MITF, MLH1, MSH2, MSH3, MSH6, MUTYH, NBN, NF1, NF2, NTHL1, PALB2, PDGFRA, PHOX2B, PMS2, POLD1, POLE, POT1, PRKAR1A, PTCH1, PTEN, RAD50, RAD51C, RAD51D, RB1, RECQL4, RET, RUNX1, SDHA, SDHAF2, SDHB, SDHC, SDHD, SMAD4, SMARCA4, SMARCB1, SMARCE1, STK11, SUFU, TERC, TERT, TMEM127, TP53, TSC1, TSC2, VHL, WRN, WT1).  Since the current test is not perfect, it is possible that there may be a gene mutation that current testing cannot detect, but that chance is small. It is possible that a different genetic factor, which has not yet been discovered or is not on this panel, is responsible for the cancer diagnoses in the family. Again, the likelihood of this is low. No additional testing is recommended at this time for Mary Wise.  A Variant of Uncertain Significance was detected: PTCH1 [id number]  (p.[id number]). This is still considered a normal result. While at this time, it is unknown if this finding is associated with increased cancer risk, the majority of these variants get reclassified to be inconsequential. Medical management should not be based on this finding. With time, we suspect the lab will determine the significance, if any. If we do learn more about it, we will try to contact Mary Wise to discuss it further. It is important to stay in touch with [place] periodically and keep the address and phone number up to date.  Cancer Screening: These results suggest that Mary Wise's cancer was most likely not due to an inherited predisposition. Most cancers happen by chance and this test, along with details of her family history, suggests that her cancer falls into this category. She is recommended to follow the cancer screening guidelines provided by her physician.   Family Members: Family members are at some increased risk of developing cancer, over the general population risk, simply due to the family history. They are recommended to speak with their own providers about appropriate cancer screenings.   Any relative who had cancer at a young age or had a particularly rare cancer may also wish to pursue genetic testing. Genetic counselors can be located in other cities, by visiting the website of the [employer] (ArtistMovie.se) and Field seismologist for a Dietitian by zip code.   Family members are not recommended to get tested for the above VUS outside of a research protocol as this finding has no implications for their medical  management.    Lastly, cancer genetics is a rapidly advancing field and it is possible that new genetic tests will be appropriate for Mary Wise in the future. Mary Wise is encouraged to remain in contact with Genetics on an annual basis so we can update her personal and family histories, and let her know of advances in cancer genetics that may  benefit the family. Mary Wise questions were answered to her satisfaction today, and she knows she is welcome to call anytime with additional questions.     Steele Berg, MS, [place] Certified Genetic Counselor phone: [phone]

## 2018-02-01 NOTE — Addendum Note (Signed)
Addended by: Vonna Kotyk A on: 02/01/2018 04:24 PM   Modules accepted: Orders

## 2018-02-01 NOTE — Progress Notes (Addendum)
Patient ID: ELAJAH KUNSMAN MRN: [id number], DOB: [birth year], 67 y.o. Date of Encounter: @DATE @  Chief Complaint:  Chief Complaint  Patient presents with  . New Patient (Initial Visit)    HPI: 67 y.o. year old female  presents as a New patient to Establish Care.   Reviewed her note in epic from 07/01/2017 from [facility].  That note discusses insomnia and that she was using Ambien at that point.  Note also states that she had been having migraines multiple times per month.  At that she had had them since she was a child.  Also saw in epic that there are multiple notes with oncology.  At the beginning of the visit with her she pulls out different medicine bottles that she has been using recently. Famotidine 20 mg--- she states that that works well and that she currently is taste takes that daily. She states that the rest of the medications she does not use on a daily basis. Lidocaine patch--- shows me that she currently has 1 patch cut into 3 pieces and is put one piece on each knee and one on her low back. Other medicine bottles include oxycodone 5 mg, hydrocodone, and alprazolam 1 mg. She also shows me a medicine bottle for mirtazapine but says that was given to her to help with insomnia but that it did not help the insomnia at all and that it was causing ringing in her ears" does not work ". She also has a whole old box that contained Midrin and said that in the past that worked well for her migraines but they no longer make that medicine can no longer use that.  Today states that she is not getting good sleep and is only getting a couple hours asleep at night.  States that the Ambien 10 mg had worked great and needs to get another prescription of that.  She also states that she has been having a migraine for 3 to 4 days straight now.  States that she has to be in a quiet dark room or else symptoms get even worse but even with being in a quiet and dark migraine is not  resolving.  Pulls down the sleeve of her left arm of her shirt and shows me large mass at her left shoulder.  Reviewed in her chart that this is a lipoma.  She states that she is scheduled to have surgery to remove this at [facility] on August 5.  She has been seeing the local cancer doctors for breast cancer and has been going undergoing treatments for that.  States that some of those treatments have caused "blisters on her feet ".  States that she has not driven for the past year.  A friend did come with her to today's visit. She states that those treatments also affected her teeth.  No other concerns or history address today.     Past Medical History:  Diagnosis Date  . Anxiety   . Asthma   . Cancer [facility])    left breast cancer  . Family history of cancer   . Hemorrhoids   . Lipoma of left shoulder   . Migraine   . Migraine   . 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 albuterol (PROVENTIL HFA;VENTOLIN HFA) 108 (90 Base) MCG/ACT inhaler Inhale 2 puffs into the lungs every 6 (six)  hours as needed for wheezing or shortness of breath. 6.7 g 2  . ALPRAZolam (XANAX) 1 MG tablet TAKE 1 TABLET BY MOUTH ONCE DAILY AS NEEDED FOR ANXIETY 30 tablet 0  . Bioflavonoid Products (BIOFLEX) TABS Take 1 tablet by mouth daily.    Marland Kitchen HYDROcodone-acetaminophen (NORCO) 5-325 MG tablet Take 1 tablet by mouth every 6 (six) hours as needed for moderate pain. 30 tablet 0  . Hypromellose (ARTIFICIAL TEARS OP) Apply to eye 2 (two) times daily. Doesn't do drops in eyes does as a wash to flush eyes out    . ibuprofen (ADVIL,MOTRIN) 800 MG tablet Take 800 mg by mouth every 8 (eight) hours as needed.    . lidocaine (LIDODERM) 5 % Place 0.5-1 patches onto the skin daily as needed (pain). Remove & Discard patch within 12 hours or as directed by MD 30 patch 2  . lidocaine-prilocaine (EMLA) cream Apply 1 application  topically as needed. 30 g 2  . mirtazapine (REMERON) 7.5 MG tablet Take 1 tablet (7.5 mg total) by mouth at bedtime. 30 tablet 0  . ondansetron (ZOFRAN) 8 MG tablet Take 1 tablet (8 mg total) by mouth 2 (two) times daily as needed. Start on the third day after chemotherapy. 30 tablet 3  . oxyCODONE (OXY IR/ROXICODONE) 5 MG immediate release tablet Take 1 tablet (5 mg total) by mouth every 6 (six) hours as needed for severe pain. 10 tablet 0  . prochlorperazine (COMPAZINE) 10 MG tablet Take 1 tablet (10 mg total) by mouth every 6 (six) hours as needed for nausea or vomiting. 30 tablet 2  . Menthol, Topical Analgesic, (BIOFREEZE EX) Apply 1 application topically as needed (pain).    Marland Kitchen ALPRAZolam (XANAX) 1 MG tablet TAKE 1 TABLET BY MOUTH ONCE DAILY AS NEEDED FOR ANXIETY 30 tablet 0   No facility-administered medications prior to visit.     Allergies:  Allergies  Allergen Reactions  . Onion Other (See Comments)    REACTION: results in coma state  . Penicillins Hives, Shortness Of Breath and Swelling    Throat swelling Has patient had a PCN reaction causing immediate rash, facial/tongue/throat swelling, SOB or lightheadedness with hypotension: Yes Has patient had a PCN reaction causing severe rash involving mucus membranes or skin necrosis: No Has patient had a PCN reaction that required hospitalization: Yes Has patient had a PCN reaction occurring within the last 10 years: No If all of the above answers are "NO", then may proceed with Cephalosporin use.   . Sulfa Antibiotics Hives and Shortness Of Breath  . Yellow Dye     REACTION: #5 and #90 "will kill me"  . Clindamycin/Lincomycin Hives and Nausea And Vomiting    Social History   Socioeconomic History  . Marital status: Single    Spouse name: Not on file  . Number of children: Not on file  . Years of education: Not on file  . Highest education level: Not on file  Occupational History  . Not on file  Social Needs  . Financial  resource strain: Not on file  . Food insecurity:    Worry: Not on file    Inability: Not on file  . Transportation needs:    Medical: Not on file    Non-medical: Not on file  Tobacco Use  . Smoking status: Former Smoker    Packs/day: 0.25    Years: 9.00    Pack years: 2.25    Last attempt to quit: 1982    Years since  quitting: 37.5  . Smokeless tobacco: Never Used  Substance and Sexual Activity  . Alcohol use: No    Comment: social   . Drug use: No  . Sexual activity: Never  Lifestyle  . Physical activity:    Days per week: Not on file    Minutes per session: Not on file  . Stress: Not on file  Relationships  . Social connections:    Talks on phone: Not on file    Gets together: Not on file    Attends religious service: Not on file    Active member of club or organization: Not on file    Attends meetings of clubs or organizations: Not on file    Relationship status: Not on file  . Intimate partner violence:    Fear of current or ex partner: Not on file    Emotionally abused: Not on file    Physically abused: Not on file    Forced sexual activity: Not on file  Other Topics Concern  . Not on file  Social History Narrative   Lives in [place] on [address].     Family History  Problem Relation Age of Onset  . Cancer Mother 92       unk. primary; deceased 3  . Cancer Father 3       brain cancer; deceased 61  . Ovarian cancer Sister 39       deceased 78  . Diabetes Brother   . Breast cancer Maternal Grandmother        dx 85s; deceased 66  . Cancer Other        "female ca"  . Diabetes Sister   . Diabetes Sister   . Diabetes Sister   . Cancer Paternal Uncle        brain cancer   . Cancer Paternal Grandmother        "female ca"  . Cancer Paternal Grandfather        lung cancer  . Cancer Paternal Uncle        brain tumor   . Cancer Paternal Uncle        brain tumor   . Stomach cancer Maternal Aunt 87       deceased 69  . Cancer Paternal Aunt          ink. primary in 2 aunts  . Melanoma Other   . Lung cancer Maternal Aunt   . Stomach cancer Cousin        2 sons of an unaffected maternal uncle     Review of Systems:  See HPI for pertinent ROS. All other ROS negative.    Physical Exam: Blood pressure (!) 134/96, pulse (!) 105, temperature 98 F (36.7 C), temperature source Oral, resp. rate 20, height 5' 6.5" (1.689 m), weight 86.6 kg (191 lb), SpO2 94 %., Body mass index is 30.37 kg/m. General: WNWD WF. Appears in no acute distress. Neck: Supple. No thyromegaly. No lymphadenopathy. Lungs: Clear bilaterally to auscultation without wheezes, rales, or rhonchi. Breathing is unlabored. Heart: RRR with S1 S2. No murmurs, rubs, or gallops. Abdomen: Soft, non-tender, non-distended with normoactive bowel sounds. No hepatomegaly. No rebound/guarding. No obvious abdominal masses. Musculoskeletal:  Strength and tone normal for age.Large mass left shoulder.  Extremities/Skin: Warm and dry.  Neuro: Alert and oriented X 3. Moves all extremities spontaneously. Gait is normal. CNII-XII grossly in tact. Psych:  Responds to questions appropriately with a normal affect.     ASSESSMENT AND PLAN:  67 y.o. year old female with   1. Encounter for medical examination to establish care   2. Intractable migraine without aura and with status migrainosus Will give Toradol 60mg  IM for migraine  3. Insomnia, unspecified type Will prescribe Ambien 10mg  -- this worked well in past  4. Lipoma of left shoulder States that she is scheduled for surgery to remove this at [facility] on August 5.  5. Anxiety She has alprazolam 1 mg available to use as needed for this.  6. Malignant neoplasm of upper-outer quadrant of left breast in female, estrogen receptor negative ([place]) Is managed by the oncologist locally.  At the end of visit, she states that she will need refills on medicines from PCP in future.  I said I thought Oncology was prescribing pain  meds--- she says that she will not see them back for a while since she is going to [facility] for surgery of lipoma on left shoulder and that she is to get med Rxes from PCP in interim.   Told her to have pharmacy inform us when she needs refills.   Marin Olp [place], [place], [facility] 02/01/2018 2:55 PM

## 2018-02-02 ENCOUNTER — Telehealth: Payer: Self-pay | Admitting: Hematology

## 2018-02-02 ENCOUNTER — Telehealth: Payer: Self-pay

## 2018-02-02 NOTE — Telephone Encounter (Signed)
tried to reach patient regarding 7/8 I did leave a  message

## 2018-02-02 NOTE — Telephone Encounter (Signed)
Prior authorIzation is needed for Zolpidem Tartrate. I called patent to get insurance information for her prescriptions. [place]

## 2018-02-08 ENCOUNTER — Other Ambulatory Visit: Payer: Medicare Other

## 2018-02-08 ENCOUNTER — Ambulatory Visit: Payer: Medicare Other | Admitting: Hematology

## 2018-02-09 ENCOUNTER — Other Ambulatory Visit (HOSPITAL_COMMUNITY): Payer: Self-pay | Admitting: Orthopedic Surgery

## 2018-02-09 DIAGNOSIS — D1720 Benign lipomatous neoplasm of skin and subcutaneous tissue of unspecified limb: Secondary | ICD-10-CM | POA: Diagnosis not present

## 2018-02-09 DIAGNOSIS — D1722 Benign lipomatous neoplasm of skin and subcutaneous tissue of left arm: Secondary | ICD-10-CM

## 2018-02-09 NOTE — Telephone Encounter (Signed)
Patient called and provided insurance information. Prior Authorization form faxed over to [place]. Prior authorization has been approved for for Zolpidem  Tartrate referral number [id number] effective 08/02/17-08/03/2018. Pharmacy notified

## 2018-02-10 ENCOUNTER — Encounter: Payer: Self-pay | Admitting: Radiation Oncology

## 2018-02-10 ENCOUNTER — Telehealth: Payer: Self-pay | Admitting: Hematology

## 2018-02-10 NOTE — Telephone Encounter (Signed)
Returned patients call to r/s upcoming appts due to transportation.

## 2018-02-11 ENCOUNTER — Ambulatory Visit (HOSPITAL_COMMUNITY)
Admission: RE | Admit: 2018-02-11 | Discharge: 2018-02-11 | Disposition: A | Discharge status: Home / Self Care | Payer: Medicare Other | Source: Ambulatory Visit | Attending: Orthopedic Surgery | Admitting: Orthopedic Surgery

## 2018-02-11 DIAGNOSIS — D1720 Benign lipomatous neoplasm of skin and subcutaneous tissue of unspecified limb: Secondary | ICD-10-CM | POA: Diagnosis present

## 2018-02-11 DIAGNOSIS — M19012 Primary osteoarthritis, left shoulder: Secondary | ICD-10-CM | POA: Diagnosis not present

## 2018-02-11 DIAGNOSIS — M75102 Unspecified rotator cuff tear or rupture of left shoulder, not specified as traumatic: Secondary | ICD-10-CM | POA: Diagnosis not present

## 2018-02-11 DIAGNOSIS — D1722 Benign lipomatous neoplasm of skin and subcutaneous tissue of left arm: Secondary | ICD-10-CM | POA: Diagnosis not present

## 2018-02-11 MED ORDER — GADOBENATE DIMEGLUMINE 529 MG/ML IV SOLN
17.0000 mL | Freq: Once | INTRAVENOUS | Status: AC | PRN
  Administered 2018-02-11: 17 mL via INTRAVENOUS

## 2018-02-17 ENCOUNTER — Ambulatory Visit: Payer: Medicare Other

## 2018-02-17 ENCOUNTER — Ambulatory Visit: Payer: Medicare Other | Admitting: Radiation Oncology

## 2018-02-18 NOTE — Progress Notes (Signed)
[place]  [phone] [phone]  Clinic Follow up Note   Patient Care Team: Rennis Golden as PCP - General (Physician Assistant)   Date of Service:  02/23/2018   CHIEF COMPLAINT: F/u triple negative breast cancer   SUMMARY OF ONCOLOGIC HISTORY: Oncology History   Cancer Staging Malignant neoplasm of upper-outer quadrant of left breast in female, estrogen receptor negative ([place]) Staging form: Breast, AJCC 8th Edition - Pathologic stage from 08/19/2017: Stage IIA (pT2, pN0(sn), cM0, G3, ER: Negative, PR: Negative, HER2: Negative) - Signed by Ardath Sax, MD on 09/01/2017       Malignant neoplasm of upper-outer quadrant of left breast in female, estrogen receptor negative ([place])   06/16/2017 Initial Diagnosis    Malignant neoplasm of upper-outer quadrant of left breast in female: Initially presented with left deltoid mass which was consistent with lipoma.  During examination, a mass in the left breast was appreciated by palpation.      06/16/2017 Mammogram    BL Diagnostic Study: Irregular hyperdense mass in the upper outer quadrant of the left breast measuring 3.6 cm without radiographically detectable lymphadenopathy.  Normal appearance of the right breast.      06/16/2017 Breast US    3.2 cm irregular mass located at 2:30 o'clock, no pathological lymphadenopathy.      06/23/2017 Pathology Results    Ultrasound-guided biopsy: Positive for invasive ductal carcinoma, grade 3, ER 0%, PR 0%, HER-2 negative by [facility]      07/31/2017 Surgery    Partial left mastectomy with sentinel lymph node biopsy --Pathology: Invasive ductal carcinoma, measuring 4.5 cm, grade 3.  All margins negative, but the anterior margin less than 0.1 cm.  No surrounding DCIS.  0/5 lymph nodes positive for malignancy      10/08/2017 PET scan    IMPRESSION: 1. No evidence breast cancer metastasis on FDG PET-CT scan. 2. Postsurgical change in the LEFT lateral  breast and LEFT axilla.      10/11/2017 -  Chemotherapy    Adjuvant chemotherapy AC every 2 weeks for 4 cycles 10/12/17-11/23/17, followed by bi-weekly Taxol for 4 cycles starting 12/08/17.       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 Uncertain Significance The following genes were evaluated for sequence changes and exonic deletions/duplications: ALK, APC, ATM, AXIN2, BAP1, BARD1, BLM, BMPR1A, BRCA1, BRCA2, BRIP1, CASR, CDC73, CDH1, CDK4, CDKN1B, CDKN1C, CDKN2A (p14ARF), CDKN2A (p16INK4a), CEBPA, CHEK2, CTNNA1, DICER1, DIS3L2, EPCAM*, FH, FLCN, GATA2, GPC3, GREM1*, HRAS, KIT, MAX, MEN1, MET, MLH1, MSH2, MSH3, MSH6, MUTYH, NBN, NF1, NF2, PALB2, PDGFRA, PHOX2B*, PMS2, POLD1, POLE, POT1, PRKAR1A, PTCH1, PTEN, RAD50, RAD51C, RAD51D, RB1, RECQL4, RET, RUNX1, SDHAF2, SDHB, SDHC, SDHD, SMAD4, SMARCA4, SMARCB1, SMARCE1, STK11, SUFU, TERC, TERT, TMEM127, TP53, TSC1, TSC2, VHL, WRN*, WT1 The following genes were evaluated for sequence changes only: EGFR*, HOXB13*, MITF*, NTHL1*, SDHA Results are negative unless otherwise indicated      HISTORY OF PRESENT ILLNESS: DINITA MIGLIACCIO 67 y.o. female presents to the [facility] for follow up her left breast cancer that has recently been surgically resected. The patient was referred by Dr. Isidore Moos.  She was previously seen by my partner Dr. Lebron Conners at [facility].  Patient states she noticed a lump in her left breast after she had knocked into her bed and felt pain in the area. 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 She saw her PCP and during the evaluation, a mass was discovered in the lateral aspect of the left breast leading to additional evaluation. She had been getting regular mammograms yearly before this with one abnormal result in the past that revealed fatty tissue.  Her diagnostic  mammogram from 06/16/17 revealed a suspicious mass in the 230 o'clock location of the left breast with her initial biopsy confirming invasive ductal carcinoma. Prognostic indicators significant for: ER, 0% negative and PR, 0% negative. Proliferation marker Ki67 at 90%. HER2 negative. Pt underwent a left partial mastectomy with sentinel node biopsy with Dr. Constance Haw on 07/31/17. Surgical pathology results revealed invasive ductal carcinoma and DCIS, grade III, spanning 4.5 cm. Lymph nodes biopsied were negative.  She met Dr. Lebron Conners one month ago, adjuvant chemotherapy was recommended, but she declined.  She was seen by radiation oncologist Dr. Isidore Moos yesterday, who encouraged her to consider adjuvant chemotherapy, and referred her to me.  In the past she was diagnosed with medical conditions of anxiety, asthma, seizure and migraines. She has had gallstones that traveled to liver that required surgery, appendectomy, and complete hysterectomy.   She has a FHx of breast cancer, brain cancer, ovarian cancer, throat cancer, and lung cancer. She has a Hx of tobacco use for 9 years over 30 years ago. She  GYN HISTORY  Menarchal: 12 LMP: 89, complete hysterectomy  Contraceptive: HRT:  GP: 1 daughter, Joelene Millin, she works in the ED at [employer] she is divorced and lives by herself in an assisted living facility in [place]. She states she is able to do everything for herself. She enjoys the company of people and she is able to get meals there.   CURRENT THERAPY:  Pending lipoma surgery and adjuvant breast radiation   INTERVAL HISTORY: ROSIE TORREZ is here for follow up. She presents to the clinic today accompanied by her friend. She is complaining of severe shooting pain in her feet. She said that she uses heating pads at night to help. She has milder similar symptoms in her fingers. She tried vitamin B supplements, but it makes her nauseated.  Her gait is stable and she has no walking or balance  issues. She does yoga and would like to try PT after her surgery. She complains of panic attacks. She had a panic attack today, and thinks that it's because she was not informed that some blood tests will be done today.  She is planning to go for a resection of a left shoulder lipoma soon. She would like to have her port removed as soon as possible. Her port was flushed today.   REVIEW OF SYSTEMS:   Constitutional: Denies fevers, chills or abnormal weight loss   Eyes: Denies blurriness of vision Ears, nose, mouth, throat, and face: Denies mucositis or sore throat Respiratory: Denies cough, dyspnea or wheezes Cardiovascular: Denies palpitation, chest discomfort Gastrointestinal:  Denies heartburn or change in bowel habits  Skin: Denies abnormal skin rashes MSK: (+) Arthritic pain in knee and low back Lymphatics: Denies new lymphadenopathy or easy bruising Neurological: (+) stabbing pain in all extremities, worse in LLs Behavioral/Psych:  (+) anxiety and panic attacks Extremity: (+) lipoma of the upper left arm and shoulder All other systems were reviewed with the patient and are negative.  MEDICAL HISTORY:  Past Medical History:  Diagnosis Date  . Anxiety   . Asthma   . Cancer [facility])    left breast cancer  . Family  history of cancer   . Genetic testing 02/01/2018   Multi-Cancer panel (83 genes) @ [facility] - No pathogenic mutations detected  . Hemorrhoids   . Lipoma of left shoulder   . Migraine   . Migraine   . 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 ABDOMINAL HYSTERECTOMY    . APPENDECTOMY    . CESAREAN SECTION    . CHOLECYSTECTOMY    . ERCP    . IR FLUORO GUIDE PORT INSERTION RIGHT  10/05/2017  . IR US GUIDE VASC ACCESS RIGHT  10/05/2017  . LIVER SURGERY    . PARTIAL MASTECTOMY WITH AXILLARY SENTINEL LYMPH NODE BIOPSY Left 07/31/2017   Procedure: LEFT PARTIAL MASTECTOMY WITH AXILLARY  SENTINEL LYMPH NODE BIOPSY;  Surgeon: Virl Cagey, MD;  Location: [facility];  Service: General;  Laterality: Left;    I have reviewed the social history and family history with the patient and they are unchanged from previous note.  ALLERGIES:  is allergic to onion; penicillins; sulfa antibiotics; yellow dye; and clindamycin/lincomycin.  MEDICATIONS:  Current Outpatient Medications  Medication Sig Dispense Refill  . albuterol (PROVENTIL HFA;VENTOLIN HFA) 108 (90 Base) MCG/ACT inhaler Inhale 2 puffs into the lungs every 6 (six) hours as needed for wheezing or shortness of breath. 6.7 g 2  . ALPRAZolam (XANAX) 1 MG tablet TAKE 1 TABLET BY MOUTH ONCE DAILY AS NEEDED FOR ANXIETY 30 tablet 0  . Bioflavonoid Products (BIOFLEX) TABS Take 1 tablet by mouth daily.    Marland Kitchen HYDROcodone-acetaminophen (NORCO) 5-325 MG tablet Take 1 tablet by mouth every 6 (six) hours as needed for moderate pain. 30 tablet 0  . Hypromellose (ARTIFICIAL TEARS OP) Apply to eye 2 (two) times daily. Doesn't do drops in eyes does as a wash to flush eyes out    . ibuprofen (ADVIL,MOTRIN) 800 MG tablet Take 800 mg by mouth every 8 (eight) hours as needed.    . lidocaine (LIDODERM) 5 % Place 0.5-1 patches onto the skin daily as needed (pain). Remove & Discard patch within 12 hours or as directed by MD 30 patch 2  . lidocaine-prilocaine (EMLA) cream Apply 1 application topically as needed. 30 g 2  . Menthol, Topical Analgesic, (BIOFREEZE EX) Apply 1 application topically as needed (pain).    . mirtazapine (REMERON) 7.5 MG tablet Take 1 tablet (7.5 mg total) by mouth at bedtime. 30 tablet 0  . ondansetron (ZOFRAN) 8 MG tablet Take 1 tablet (8 mg total) by mouth 2 (two) times daily as needed. Start on the third day after chemotherapy. 30 tablet 3  . oxyCODONE (OXY IR/ROXICODONE) 5 MG immediate release tablet Take 1 tablet (5 mg total) by mouth every 6 (six) hours as needed for severe pain. 10 tablet 0  . prochlorperazine (COMPAZINE)  10 MG tablet Take 1 tablet (10 mg total) by mouth every 6 (six) hours as needed for nausea or vomiting. 30 tablet 2  . zolpidem (AMBIEN) 10 MG tablet Take 1 tablet (10 mg total) by mouth at bedtime as needed for sleep. 30 tablet 5  . gabapentin (NEURONTIN) 100 MG capsule Take 1 capsule (100 mg total) by mouth 3 (three) times daily. 90 capsule 0  . SUMAtriptan (IMITREX) 100 MG tablet Take 1 tablet 143m  By mouth at onset of migraine, If symptoms persist 2 hours later then take additional dose.Maximum 2 per 24 hours 9 tablet 5   No  current facility-administered medications for this visit.     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  Pulse 100   Temp 98.1 F (36.7 C) (Oral)   Resp 20   Ht 5' 6.5" (1.689 m)   Wt 191 lb 9.6 oz (86.9 kg)   SpO2 98%   BMI 30.46 kg/m  GENERAL:alert, no distress and comfortable (+) hair loss SKIN: skin color, texture, turgor are normal, no rashes or significant lesions EYES: normal, Conjunctiva are pink and non-injected, sclera clear OROPHARYNX:no exudate, no erythema and lips, buccal mucosa, and tongue normal  NECK: supple, thyroid normal size, non-tender, without nodularity LYMPH:  no palpable lymphadenopathy in the cervical, axillary or inguinal LUNGS: clear to auscultation and percussion with normal breathing effort HEART: regular rate & rhythm and no murmurs and no lower extremity edema ABDOMEN:abdomen soft, non-tender and normal bowel sounds. No hepatomegaly  Musculoskeletal:no cyanosis of digits and no clubbing  NEURO: alert & oriented x 3 with fluent speech, no focal motor/sensory deficits  EXTREMITY: left upper extremity with 14 cm by 11 cm subcutaneous mass/ (+) RLL swelling   LABORATORY DATA:  I have reviewed the data as listed CBC Latest Ref Rng & Units 02/23/2018 01/25/2018 01/18/2018  WBC 3.9 - 10.3 K/uL 5.4 3.6(L) 3.2(L)  Hemoglobin 11.6 -  15.9 g/dL 12.9 11.0(L) 11.7  Hematocrit 34.8 - 46.6 % 39.1 33.4(L) 35.5  Platelets 145 - 400 K/uL 313 306 303     CMP Latest Ref Rng & Units 02/23/2018 01/25/2018 01/18/2018  Glucose 70 - 99 mg/dL 81 99 95  BUN 8 - 23 mg/dL '12 12 10  ' Creatinine 0.44 - 1.00 mg/dL 0.72 0.77 0.79  Sodium 135 - 145 mmol/L 140 140 142  Potassium 3.5 - 5.1 mmol/L 4.0 3.6 3.8  Chloride 98 - 111 mmol/L 105 103 106  CO2 22 - 32 mmol/L '24 27 26  ' Calcium 8.9 - 10.3 mg/dL 9.3 9.3 9.8  Total Protein 6.5 - 8.1 g/dL 7.6 7.1 7.4  Total Bilirubin 0.3 - 1.2 mg/dL <0.2(L) <0.2(L) 0.3  Alkaline Phos 38 - 126 U/L 111 100 106  AST 15 - 41 U/L '26 23 29  ' ALT 0 - 44 U/L '26 30 28   ' PROCEDURE  Echo 10/08/17 Study Conclusions - Left ventricle: The cavity size was normal. There was moderate   concentric hypertrophy. Systolic function was normal. The   estimated ejection fraction was in the range of 55% to 60%. Wall   motion was normal; there were no regional wall motion   abnormalities. Doppler parameters are consistent with abnormal   left ventricular relaxation (grade 1 diastolic dysfunction).   There was no evidence of elevated ventricular filling pressure by   Doppler parameters. - Aortic valve: Trileaflet; normal thickness leaflets. There was no   regurgitation. - Aortic root: The aortic root was normal in size. - Mitral valve: There was no regurgitation. - Right ventricle: Systolic function was normal. - Right atrium: The atrium was normal in size. - Tricuspid valve: There was no regurgitation. - Pulmonic valve: There was no regurgitation. - Pulmonary arteries: Systolic pressure could not be accurately   estimated. - Inferior vena cava: The vessel was normal in size. - Pericardium, extracardiac: There was no pericardial effusion.  PATHOLOGY  01/25/2018 Molecular Pathology   Initial biopsy  Diagnosis 07/02/17 Breast, left, needle core biopsy, 2:00 - INVASIVE DUCTAL CARCINOMA. - SEE COMMENT. Microscopic  Comment  The carcinoma appears grade III. A breast prognostic profile will be performed and the results reported separately. The results were called to [facility] on 06/24/2017. (JBK:ecj 06/24/2017) Results: HER2 - NEGATIVE RATIO OF HER2/CEP17 SIGNALS 1.60 AVERAGE HER2 COPY NUMBER PER CELL 3.60 Results: IMMUNOHISTOCHEMICAL AND MORPHOMETRIC ANALYSIS PERFORMED MANUALLY Estrogen Receptor: 0%, NEGATIVE Progesterone Receptor: 0%, NEGATIVE Proliferation Marker Ki67: 90%  Surgical pathology  Diagnosis 07/31/18 1. Lymph node, sentinel, biopsy, left - ONE OF ONE LYMPH NODES NEGATIVE FOR CARCINOMA (0/1). 2. 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 - INVASIVE CARCINOMA IS <0.1 CM OF THE ANTERIOR MARGIN BROADLY. - SEE ONCOLOGY TABLE. 3. Breast, excision, left deep - BENIGN BREAST TISSUE. 4. Breast, excision, left superior - BENIGN BREAST TISSUE. 5. Breast, excision, left medial - BENIGN BREAST TISSUE. 6. Breast, excision, left inferior - BENIGN BREAST TISSUE. 7. Breast, excision, left lateral - BENIGN BREAST TISSUE. 8. Lymph node, sentinel, biopsy - ONE OF ONE LYMPH NODES NEGATIVE FOR CARCINOMA (0/1). 9. Lymph node, sentinel, biopsy - ONE OF ONE LYMPH NODES NEGATIVE FOR CARCINOMA (0/1). 10. Lymph node, sentinel, biopsy - ONE OF ONE LYMPH NODES NEGATIVE FOR CARCINOMA (0/1). 11. Lymph node, sentinel, biopsy - ONE OF ONE LYMPH NODES NEGATIVE FOR CARCINOMA (0/1). Microscopic Comment 2. BREAST, INVASIVE TUMOR Procedure: Left partial mastectomy with additional margins and left sentinel lymph node biopsies. Laterality: Left. 1 of 4 FINAL for JESSICIA, [place] ([id number]) Microscopic Comment(continued) Tumor Size: 4.5 cm. Histologic Type: Invasive ductal carcinoma. Grade: 3 Tubular Differentiation: 2 Nuclear Pleomorphism: 3 Mitotic Count: 3 Ductal Carcinoma in Situ (DCIS): Not identified. Extent of Tumor: Confined to  breast parenchyma. Margins: Invasive carcinoma, distance from closest margin: <0.1 cm of anterior margin broadly. Remaining final margins (parts 3-7) are >1 cm. DCIS, distance from closest margin: N/A. Regional Lymph Nodes: Number of Lymph Nodes Examined: 5 Number of Sentinel Lymph Nodes Examined: 5 Lymph Nodes with Macrometastases: 0 Lymph Nodes with Micrometastases: 0 Lymph Nodes with Isolated Tumor Cells: 0 Breast Prognostic Profile: Performed on biopsy [id number], see below. Will not be repeated. Estrogen Receptor: Negative. Progesterone Receptor: Negative. Her2: Negative (ratio 1.60). Ki-67: 90%. Best tumor block for sendout testing: 2D Pathologic Stage Classification (pTNM, AJCC 8th Edition): Primary Tumor (pT): pT2 Regional Lymph Nodes (pN): pN0 Distant Metastases (pM): pMX Comments: None.  RADIOGRAPHIC STUDIES: I have personally reviewed the radiological images as listed and agreed with the findings in the report. No results found.    PET Scan 10/08/17 IMPRESSION: 1. No evidence breast cancer metastasis on FDG PET-CT scan. 2. Postsurgical change in the LEFT lateral breast and LEFT axilla.  Diagnostic Mammogram and [place] 06/16/17 IMPRESSION: Suspicious mass in the 230 o'clock location of the left breast. Tissue diagnosis is recommended. No evidence for adenopathy.  ASSESSMENT & PLAN:  Mary Wise 67 y.o. 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  1. 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  Proliferation marker Ki67 at 90% -I previously discussed her breast imaging and surgical pathology results with patient and her daughter in great detail. Her diagnostic mammogram from 06/16/17 revealed a suspicious mass in the 230 o'clock location of the left breast with her initial biopsy confirming invasive ductal carcinoma.  - Pt underwent a left  partial mastectomy with sentinel node biopsy with Dr. Constance Haw on 07/31/17. Surgical pathology results revealed invasive ductal  carcinoma, grade III, spanning 4.5 cm. 5 lymph nodes biopsied were negative.  -Given her strong family history of cancer, we recommend her to undergo genetic testing to ruled out inheritable breast cancer. She agreed  - She is at high risk for recurrence due to her Stage II triple negative disease which is more aggressive than ER PR positive disease. Her estimated risk for distant recurrence is about 30-35% -She has completed adjuvant chemotherapy with AC-T, tolerated moderately well overall.   -She has recovered well from chemotherapy, but has had a worsening peripheral neuropathy, I called in Neurontin for her today. -She is scheduled to have left upper extremity lipoma surgery by Dr. Percival Spanish at [facility] on 03/15/18  -She has appointment with Dr. Isidore Moos in mid August for adjuvant breast radiation. -IR port removal in 1-2 weeks by IR -We discussed breast cancer surveillance after she completes radiation. -Lab and f/u in 2-3 months    2. Anxiety -She takes Xanax PRN but has found herself using it more often since her Breast Cancer diagnosis. -I refilled for her on 09/30/17  3. Large left upper arm lipoma  -She has a large lipoma in the upper arm/should area, she was seen by Dr. Sherlynn Stalls at [facility].  -Dr. Sherlynn Stalls plan to have surgical resection after she completes chemo, and he will coordinate with her radiation with Dr. Isidore Moos.  4. Genetics -She has strong family history of breast, ovarian cancer and brain tumor, I strongly recommend her to have genetic testing to rule out inheritable genetic syndrome.  She agrees, I referred her -Appointment was rescheduled  5. Insomnia  -She has chronic insomnia, tried trazodone, melatonin, and benadryl in the past. She was historically on Ambien x30 years but a previous provider took her off it. She gets approx 2 hours of sleep with  xanax. Regan Rakers reviewed sleep hygiene with her and prescribed remeron on 10/26/17.  -I refilled xanax today   6.  Arthralgia and back pain -She states she has been taking hydrocodone occasionally (she gets from ED visits) for 10 years for arthritis. She uses it as needed.  -She has new onset right ankle pain for a fall that she has been taking pain medication Norco 1-2 tabs/day for the past 5 days. She also reports that the chemo has been making her arthritis and body aches worse.  -pt requested refill of her norco on 11/09/2017. I advised her that I would refill for her 1 more time and that she should only use this medication as needed and no more than 2 per day. She voiced good understanding. We discussed narcotic addiction, and I discouraged her to use it for long-term. -Has tramadol at home, I encouraged her to use as needed for pain also. Regan Rakers previously filled Lidocaine patch but was denied for coverage. -I recommend EMLA cream as needed she can use for her knee and back pain, she has refills available.  -She has developed a severe diffuse body pain after Taxol, resolved after for 5 days.  She required oxycodone as needed for the pain. This is resolved now -I do not plan to refill her narcotics in the future   7.  Peripheral neuropathy, G2 -She has developed worsening peripheral neuropathy lately, especially on her feet -I prescribed Neurontin today, she will start 100 mg at night, and gradually increase to 300 mg.  If she tolerates it well, she can take 100 to 200 mg 1-2 times during the day also   Plan -I refilled gabapentin and xanax  today -IR port removal in 1-2 weeks  -Lab and f/u in 2-3 months  -she will have lipoma surgery at [facility] on 8/12 -she will see Dr. Isidore Moos in mid August for adjuvant radiation     No orders of the defined types were placed in this encounter.  All questions were answered. The patient knows to call the clinic with any problems, questions or concerns. No  barriers to learning was detected.  I spent 25 minutes counseling the patient face to face. The total time spent in the appointment was 30 minutes and more than 50% was on counseling and review of test results  I, Noor Dweik am acting as scribe for Dr. Truitt Merle.  I have reviewed the above documentation for accuracy and completeness, and I agree with the above.    Truitt Merle, MD 02/23/18

## 2018-02-22 ENCOUNTER — Other Ambulatory Visit: Payer: Self-pay

## 2018-02-22 DIAGNOSIS — F419 Anxiety disorder, unspecified: Secondary | ICD-10-CM

## 2018-02-22 NOTE — Telephone Encounter (Signed)
Patient also is requesting rx to help with her migraines

## 2018-02-22 NOTE — Telephone Encounter (Signed)
Last OV 02/01/2018 Last refill 01/22/2018 Ok to refill?

## 2018-02-23 ENCOUNTER — Encounter: Payer: Self-pay | Admitting: Hematology

## 2018-02-23 ENCOUNTER — Inpatient Hospital Stay: Payer: Medicare Other

## 2018-02-23 ENCOUNTER — Telehealth: Payer: Self-pay | Admitting: Hematology

## 2018-02-23 ENCOUNTER — Inpatient Hospital Stay (HOSPITAL_BASED_OUTPATIENT_CLINIC_OR_DEPARTMENT_OTHER): Payer: Medicare Other | Admitting: Hematology

## 2018-02-23 ENCOUNTER — Inpatient Hospital Stay: Payer: Medicare Other | Attending: Hematology

## 2018-02-23 VITALS — BP 152/100 | HR 100 | Temp 98.1°F | Resp 20 | Ht 66.5 in | Wt 191.6 lb

## 2018-02-23 DIAGNOSIS — G620 Drug-induced polyneuropathy: Secondary | ICD-10-CM

## 2018-02-23 DIAGNOSIS — Z171 Estrogen receptor negative status [ER-]: Secondary | ICD-10-CM | POA: Insufficient documentation

## 2018-02-23 DIAGNOSIS — C50412 Malignant neoplasm of upper-outer quadrant of left female breast: Secondary | ICD-10-CM | POA: Diagnosis not present

## 2018-02-23 DIAGNOSIS — D1722 Benign lipomatous neoplasm of skin and subcutaneous tissue of left arm: Secondary | ICD-10-CM

## 2018-02-23 DIAGNOSIS — F410 Panic disorder [episodic paroxysmal anxiety] without agoraphobia: Secondary | ICD-10-CM | POA: Insufficient documentation

## 2018-02-23 DIAGNOSIS — Z95828 Presence of other vascular implants and grafts: Secondary | ICD-10-CM

## 2018-02-23 DIAGNOSIS — G4700 Insomnia, unspecified: Secondary | ICD-10-CM | POA: Diagnosis not present

## 2018-02-23 DIAGNOSIS — F419 Anxiety disorder, unspecified: Secondary | ICD-10-CM

## 2018-02-23 LAB — CBC WITH DIFFERENTIAL (CANCER CENTER ONLY)
Basophils Absolute: 0.1 10*3/uL (ref 0.0–0.1)
Basophils Relative: 2 %
Eosinophils Absolute: 0.3 10*3/uL (ref 0.0–0.5)
Eosinophils Relative: 6 %
HEMATOCRIT: 39.1 % (ref 34.8–46.6)
HEMOGLOBIN: 12.9 g/dL (ref 11.6–15.9)
LYMPHS ABS: 2 10*3/uL (ref 0.9–3.3)
LYMPHS PCT: 37 %
MCH: 30.2 pg (ref 25.1–34.0)
MCHC: 33 g/dL (ref 31.5–36.0)
MCV: 91.6 fL (ref 79.5–101.0)
MONO ABS: 0.4 10*3/uL (ref 0.1–0.9)
Monocytes Relative: 8 %
NEUTROS ABS: 2.6 10*3/uL (ref 1.5–6.5)
NEUTROS PCT: 47 %
Platelet Count: 313 10*3/uL (ref 145–400)
RBC: 4.27 MIL/uL (ref 3.70–5.45)
RDW: 13.6 % (ref 11.2–14.5)
WBC Count: 5.4 10*3/uL (ref 3.9–10.3)

## 2018-02-23 LAB — CMP (CANCER CENTER ONLY)
ALK PHOS: 111 U/L (ref 38–126)
ALT: 26 U/L (ref 0–44)
ANION GAP: 11 (ref 5–15)
AST: 26 U/L (ref 15–41)
Albumin: 4.2 g/dL (ref 3.5–5.0)
BUN: 12 mg/dL (ref 8–23)
CALCIUM: 9.3 mg/dL (ref 8.9–10.3)
CHLORIDE: 105 mmol/L (ref 98–111)
CO2: 24 mmol/L (ref 22–32)
Creatinine: 0.72 mg/dL (ref 0.44–1.00)
GFR, Est AFR Am: >60 mL/min (ref >60)
GFR, Estimated: >60 mL/min (ref >60)
Glucose, Bld: 81 mg/dL (ref 70–99)
Potassium: 4 mmol/L (ref 3.5–5.1)
SODIUM: 140 mmol/L (ref 135–145)
Total Bilirubin: <0.2 mg/dL — ABNORMAL LOW (ref 0.3–1.2)
Total Protein: 7.6 g/dL (ref 6.5–8.1)

## 2018-02-23 MED ORDER — SODIUM CHLORIDE 0.9% FLUSH
10.0000 mL | Freq: Once | INTRAVENOUS | Status: AC
  Administered 2018-02-23: 10 mL
  Filled 2018-02-23: qty 10

## 2018-02-23 MED ORDER — SUMATRIPTAN SUCCINATE 100 MG PO TABS: ORAL_TABLET | ORAL | 5 refills | Status: DC

## 2018-02-23 MED ORDER — GABAPENTIN 100 MG PO CAPS: 100.0000 mg | ORAL_CAPSULE | Freq: Three times a day (TID) | ORAL | 0 refills | Status: DC

## 2018-02-23 MED ORDER — ALPRAZOLAM 1 MG PO TABS: ORAL_TABLET | ORAL | 2 refills | Status: DC

## 2018-02-23 MED ORDER — HEPARIN SOD (PORK) LOCK FLUSH 100 UNIT/ML IV SOLN
500.0000 [IU] | Freq: Once | INTRAVENOUS | Status: AC
  Administered 2018-02-23: 500 [IU]
  Filled 2018-02-23: qty 5

## 2018-02-23 MED ORDER — ALPRAZOLAM 1 MG PO TABS: ORAL_TABLET | ORAL | 0 refills | Status: DC

## 2018-02-23 NOTE — Telephone Encounter (Signed)
Gave patient avs and calendar of upcoming appts.  °

## 2018-02-23 NOTE — Telephone Encounter (Signed)
For Migraines---- Rx---Imitrex 100mg  1 po at onset of migraine symptoms. If symptoms persist 2 hours later, then may take additional dose. Maximum 2 per 24 hours # 9 + 5.  Inform her that this medication works best if it is taken at the very first sign that a migraine is coming on .  Also may call in refill for xanax for current quantity + 2

## 2018-02-23 NOTE — Addendum Note (Signed)
Addended by: Vonna Kotyk A on: 02/23/2018 01:07 PM   Modules accepted: Orders

## 2018-03-01 ENCOUNTER — Other Ambulatory Visit: Payer: Medicare Other

## 2018-03-01 ENCOUNTER — Ambulatory Visit: Payer: Medicare Other | Admitting: Hematology

## 2018-03-15 DIAGNOSIS — D1722 Benign lipomatous neoplasm of skin and subcutaneous tissue of left arm: Secondary | ICD-10-CM | POA: Diagnosis not present

## 2018-03-15 DIAGNOSIS — Z79899 Other long term (current) drug therapy: Secondary | ICD-10-CM | POA: Diagnosis not present

## 2018-03-15 DIAGNOSIS — D179 Benign lipomatous neoplasm, unspecified: Secondary | ICD-10-CM | POA: Diagnosis not present

## 2018-03-15 DIAGNOSIS — Z9221 Personal history of antineoplastic chemotherapy: Secondary | ICD-10-CM | POA: Diagnosis not present

## 2018-03-15 DIAGNOSIS — J45909 Unspecified asthma, uncomplicated: Secondary | ICD-10-CM | POA: Diagnosis not present

## 2018-03-15 DIAGNOSIS — D2112 Benign neoplasm of connective and other soft tissue of left upper limb, including shoulder: Secondary | ICD-10-CM | POA: Diagnosis not present

## 2018-03-15 DIAGNOSIS — C50912 Malignant neoplasm of unspecified site of left female breast: Secondary | ICD-10-CM | POA: Diagnosis not present

## 2018-03-16 HISTORY — PX: LIPOMA EXCISION: SHX5283

## 2018-03-17 NOTE — Progress Notes (Signed)
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  Receptor Status: ER(NEG), PR (NEG), Her2-neu (NEG), Ki-(90%) 07/31/17 Diagnosis 1. Lymph node, sentinel, biopsy, left - ONE OF ONE LYMPH NODES NEGATIVE FOR CARCINOMA (0/1). 2. 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 - INVASIVE CARCINOMA IS <0.1 CM OF THE ANTERIOR MARGIN BROADLY. - SEE ONCOLOGY TABLE. 3. Breast, excision, left deep - BENIGN BREAST TISSUE. 4. Breast, excision, left superior - BENIGN BREAST TISSUE. 5. Breast, excision, left medial - BENIGN BREAST TISSUE. 6. Breast, excision, left inferior - BENIGN BREAST TISSUE. 7. Breast, excision, left lateral - BENIGN BREAST TISSUE. 8. Lymph node, sentinel, biopsy - ONE OF ONE LYMPH NODES NEGATIVE FOR CARCINOMA (0/1). 9. Lymph node, sentinel, biopsy - ONE OF ONE LYMPH NODES NEGATIVE FOR CARCINOMA (0/1). 10. Lymph node, sentinel, biopsy - ONE OF ONE LYMPH NODES NEGATIVE FOR CARCINOMA (0/1). 11. Lymph node, sentinel, biopsy - ONE OF ONE LYMPH NODES NEGATIVE FOR CARCINOMA (0/1).   Did patient present with symptoms or was this found on screening mammography?: She "bumped" her Left breast lightly and then a second time. Her Breast turned darker after this injury and she presented to her PCP.   Past/Anticipated interventions by surgeon, if any: 07/31/17 [facility]. Procedure(s) Performed:Partial mastectomy with sentinel lymph node biopsy following Radiotracer injection by Radiology  Surgeon: Lanell Matar. Constance Haw, MD  Past/Anticipated interventions by medical oncology, if any:   10/11/2017 -  Chemotherapy    Adjuvant chemotherapy AC every 2 weeks for 4 cycles 10/12/17-11/23/17, followed by bi-weekly Taxol for 4 cycles starting 12/08/17.   02/23/18 Dr. Burr Medico Plan -I refilled gabapentin and xanax  today -IR port removal in 1-2 weeks  -Lab and f/u in 2-3 months  -she will have lipoma surgery at [facility] on 8/12 -she will see Dr. Isidore Moos in mid August for adjuvant radiation     Lymphedema issues, if any: She denies.   Pain issues, if any:  She reports chronic pain to her bilateral legs and knees. 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 She is unable to move her left arm. She is attending PT to improve left arm mobility.   SAFETY ISSUES:  Prior radiation? No  Pacemaker/ICD? No  Possible current pregnancy? No  Is the patient on methotrexate? No  Current Complaints / other details:  BP 133/83   Pulse 97   Temp 97.6 F (36.4 C) (Oral)   Resp 18   Ht 5' 6.5" (1.689 m)   Wt 191 lb 3.2 oz (86.7 kg)   SpO2 96%   BMI 30.40 kg/m    Wt Readings from Last 3 Encounters:  03/23/18 191 lb 3.2 oz (86.7 kg)  02/23/18 191 lb 9.6 oz (86.9 kg)  02/01/18 191 lb (86.6 kg)

## 2018-03-23 ENCOUNTER — Other Ambulatory Visit: Payer: Self-pay

## 2018-03-23 ENCOUNTER — Ambulatory Visit
Admission: RE | Admit: 2018-03-23 | Discharge: 2018-03-23 | Disposition: A | Discharge status: Home / Self Care | Payer: Medicare Other | Source: Ambulatory Visit | Attending: Radiation Oncology | Admitting: Radiation Oncology

## 2018-03-23 ENCOUNTER — Encounter: Payer: Self-pay | Admitting: Radiation Oncology

## 2018-03-23 ENCOUNTER — Other Ambulatory Visit: Payer: Self-pay | Admitting: Hematology

## 2018-03-23 VITALS — BP 133/83 | HR 97 | Temp 97.6°F | Resp 18 | Ht 66.5 in | Wt 191.2 lb

## 2018-03-23 DIAGNOSIS — Z7982 Long term (current) use of aspirin: Secondary | ICD-10-CM | POA: Insufficient documentation

## 2018-03-23 DIAGNOSIS — Z171 Estrogen receptor negative status [ER-]: Secondary | ICD-10-CM | POA: Insufficient documentation

## 2018-03-23 DIAGNOSIS — Z9221 Personal history of antineoplastic chemotherapy: Secondary | ICD-10-CM | POA: Insufficient documentation

## 2018-03-23 DIAGNOSIS — Z880 Allergy status to penicillin: Secondary | ICD-10-CM | POA: Insufficient documentation

## 2018-03-23 DIAGNOSIS — C50412 Malignant neoplasm of upper-outer quadrant of left female breast: Secondary | ICD-10-CM | POA: Diagnosis not present

## 2018-03-23 DIAGNOSIS — Z87891 Personal history of nicotine dependence: Secondary | ICD-10-CM | POA: Insufficient documentation

## 2018-03-23 DIAGNOSIS — D1722 Benign lipomatous neoplasm of skin and subcutaneous tissue of left arm: Secondary | ICD-10-CM | POA: Diagnosis not present

## 2018-03-23 DIAGNOSIS — Z881 Allergy status to other antibiotic agents status: Secondary | ICD-10-CM | POA: Diagnosis not present

## 2018-03-23 DIAGNOSIS — Z882 Allergy status to sulfonamides status: Secondary | ICD-10-CM | POA: Diagnosis not present

## 2018-03-23 DIAGNOSIS — Z79899 Other long term (current) drug therapy: Secondary | ICD-10-CM | POA: Insufficient documentation

## 2018-03-23 NOTE — Progress Notes (Signed)
Radiation Oncology         [phone] ________________________________  Name: Mary Wise MRN: [id number]  Date: 03/23/2018  DOB: [birth year]  Follow-Up Visit Note  Outpatient  CC: Monia Pouch, MD  Diagnosis:      ICD-10-CM   1. 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 She completed chemotherapy. She is accompanied by her friend Coleen, "CoCo" who was previously treated here.    Since consultation date, she underwent the following imaging: PET scan on 10/08/2017 with results of No evidence breast cancer metastasis on FDG PET-CT scan. Postsurgical change in the LEFT lateral breast and LEFT axilla.  She underwent a MR left shoulder on 02/11/2018 with results showing: A well encapsulated stable 10.8 x 7.9 x 12.3 cm fatty mass without worrisome features is identified within the left deltoid muscle. 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 No thickened septations or nodular components identified to suggest a liposarcoma. AC joint osteoarthritis. Partial articular surface tear of the supraspinatus with the remainder of the rotator cuff tendons largely intact.  Systemic therapy, if applicable, involved: Adjuvant chemotherapy AC every 2 weeks for 4 cycles 10/12/2017-11/23/2017, followed by bi-weekly Taxol for 4 cycles  starting 12/08/2017.   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  Earlier this month, she underwent surgical resection of lipomatous mass from her left shoulder at [facility] by Dr. Sherlynn Stalls on 03/15/2018 with pathology pending.   Symptomatically, the patient reports: that she did well following her lipoma removal procedure on 03/15/2018. She does have pain to the surgical site still. She denies having an appointment to follow up for suture removal. She will be following up with Physical Therapy in [place], [place] once Dr. Sherlynn Stalls approves. She is unable to raise her left arm due to chronic immobility and left shoulder pain following the lipoma removal procedure. She reports that the chemotherapy started rough with some nausea and panic attacks, however, she did well with chemotherapy overall. She stopped smoking in 1982.         ALLERGIES:  is allergic to onion; penicillins; sulfa antibiotics; yellow dye; and clindamycin/lincomycin.  Meds: Current Outpatient Medications  Medication Sig Dispense Refill  . albuterol (PROVENTIL HFA;VENTOLIN HFA) 108 (90 Base) MCG/ACT inhaler Inhale 2 puffs into the lungs every 6 (six) hours as needed for wheezing or shortness of breath. 6.7 g 2  . ALPRAZolam (XANAX) 1 MG tablet TAKE 1 TABLET BY MOUTH ONCE DAILY AS NEEDED FOR ANXIETY 30 tablet 0  . aspirin EC 81 MG tablet Take by mouth.    . gabapentin (NEURONTIN) 100 MG capsule Take 1 capsule (100 mg total) by mouth 3 (three) times daily. 90 capsule 0  . Hypromellose (ARTIFICIAL TEARS OP) Apply to eye 2 (two) times daily. Doesn't do drops in eyes does as a wash to flush eyes  out    . ibuprofen (ADVIL,MOTRIN) 800 MG tablet Take 800 mg by mouth every 8 (eight) hours as needed.    . lidocaine (LIDODERM) 5 % Place 0.5-1 patches onto the skin daily as needed (pain). Remove & Discard patch within 12 hours or as directed by MD 30 patch 2  . lidocaine-prilocaine (EMLA)  cream Apply 1 application topically as needed. 30 g 2  . ondansetron (ZOFRAN) 8 MG tablet Take 1 tablet (8 mg total) by mouth 2 (two) times daily as needed. Start on the third day after chemotherapy. 30 tablet 3  . oxyCODONE (OXY IR/ROXICODONE) 5 MG immediate release tablet Take 1 tablet (5 mg total) by mouth every 6 (six) hours as needed for severe pain. 10 tablet 0  . polyethylene glycol (MIRALAX / GLYCOLAX) packet Take by mouth.    . SUMAtriptan (IMITREX) 100 MG tablet Take 1 tablet 135m  By mouth at onset of migraine, If symptoms persist 2 hours later then take additional dose.Maximum 2 per 24 hours 9 tablet 5  . zolpidem (AMBIEN) 10 MG tablet Take by mouth.    .Marland KitchenHYDROcodone-acetaminophen (NORCO) 5-325 MG tablet Take 1 tablet by mouth every 6 (six) hours as needed for moderate pain. (Patient not taking: Reported on 03/23/2018) 30 tablet 0  . Menthol, Topical Analgesic, (BIOFREEZE EX) Apply 1 application topically as needed (pain).    . mirtazapine (REMERON) 7.5 MG tablet Take 1 tablet (7.5 mg total) by mouth at bedtime. (Patient not taking: Reported on 03/23/2018) 30 tablet 0  . prochlorperazine (COMPAZINE) 10 MG tablet Take 1 tablet (10 mg total) by mouth every 6 (six) hours as needed for nausea or vomiting. (Patient not taking: Reported on 03/23/2018) 30 tablet 2  . zolpidem (AMBIEN) 10 MG tablet Take 1 tablet (10 mg total) by mouth at bedtime as needed for sleep. 30 tablet 5   No current facility-administered medications for this encounter.     Physical Findings:  height is 5' 6.5" (1.689 m) and weight is 191 lb 3.2 oz (86.7 kg). Her oral temperature is 97.6 F (36.4 C). Her blood pressure is 133/83 and her pulse is 97. Her respiration is 18 and oxygen saturation is 96%. .     General: Alert and oriented, in no acute distress Neurologic: No obvious focalities. Speech is fluent.  Psychiatric: Judgment and insight are intact. Affect is appropriate. Breast exam reveals surgical scar well  healed in axillary region of UOQ of left breast. Further PE was deferred due to patient left shoulder immobility/ pain following recent lipoma removal.   Lab Findings: Lab Results  Component Value Date   WBC 5.4 02/23/2018   HGB 12.9 02/23/2018   HCT 39.1 02/23/2018   MCV 91.6 02/23/2018   PLT 313 02/23/2018      Radiographic Findings: As above  Impression/Plan:  It was a pleasure seeing the patient today. We discussed the risks, benefits, and side effects of radiotherapy. I recommend radiotherapy to the left breast to reduce her risk of locoregional recurrence by 2/3.  We discussed that radiation would take approximately 4 weeks to complete and that I would give the patient a few weeks to heal following surgery before starting treatment planning. If chemotherapy were to be given, this would precede radiotherapy. We spoke about acute effects including skin irritation and fatigue as well as much less common late effects including internal organ injury or irritation. We spoke about the latest technology that is used to minimize the risk of late effects  for patients undergoing radiotherapy to the breast or chest wall. No guarantees of treatment were given. The patient is enthusiastic about proceeding with treatment. I look forward to participating in the patient's care.  I will await her to call me for postoperative follow-up and eventual CT simulation/treatment planning.  Discussed with the patient that once she is able to raise her left arm to at least 90 degrees to call for a follow up CT simulation appointment. I gave her my business card today, so that she is able to call and inform me of her progress.  PT referral pending surgical followup.  Advised that the patient is able to use her albuterol inhaler or use a nebulizer treatment prior to her radiation treatments to aid with the breath-hold technique to better shield her heart.    I spent 25 minutes minutes face to face withat time was  spent in counseling and/or coordination of care.    _____________________________________   Eppie Gibson, MD   This document serves as a record of services personally performed by Eppie Gibson, MD. It was created on her behalf by Steva Colder, a trained medical scribe. The creation of this record is based on the scribe's personal observations and the provider's statements to them. This document has been checked and approved by the attending provider.

## 2018-03-24 ENCOUNTER — Encounter: Payer: Self-pay | Admitting: General Practice

## 2018-03-24 ENCOUNTER — Telehealth: Payer: Self-pay

## 2018-03-24 NOTE — Progress Notes (Signed)
[place] Psychosocial Distress Screening Clinical Social Work  Clinical Social Work was referred by distress screening protocol.  The patient scored a 9 on the Psychosocial Distress Thermometer which indicates severe distress. Clinical Social Worker contacted patient by phone to assess for distress and other psychosocial needs. "Things are going wonderful" at this time - immediately post surgery at [facility].  Hopes to start radiation in early October.  Does not have car to drive at this time, is brought for treatment by neighbor.Has support from church family, multiple family members have dealt w cancer w several deaths in the past two years.  Unsure of needs at this time - will wait til treatment plan is established to determine needs for help w transport.  Mailed information packet w [facility] resources, interested in upcoming oncology cooking class.    ONCBCN DISTRESS SCREENING 03/23/2018  Screening Type Initial Screening  Distress experienced in past week (1-10) 9  Emotional problem type Nervousness/Anxiety  Physical Problem type Pain;Tingling hands/feet;Swollen arms/legs    Clinical Social Worker follow up needed: No.  If yes, follow up plan:  Beverely Pace, [place], LCSW Clinical Social Worker Phone:  [phone]

## 2018-03-24 NOTE — Telephone Encounter (Signed)
Left voice message for Tiffany in IR to schedule port removal for this patient.

## 2018-03-29 ENCOUNTER — Other Ambulatory Visit: Payer: Self-pay

## 2018-03-29 DIAGNOSIS — Z171 Estrogen receptor negative status [ER-]: Principal | ICD-10-CM

## 2018-03-29 DIAGNOSIS — C50412 Malignant neoplasm of upper-outer quadrant of left female breast: Secondary | ICD-10-CM

## 2018-03-29 MED ORDER — GABAPENTIN 100 MG PO CAPS: 100.0000 mg | ORAL_CAPSULE | Freq: Three times a day (TID) | ORAL | 3 refills | Status: DC

## 2018-03-30 NOTE — Progress Notes (Signed)
PA for Gabapentin 100 mg capsules has been submitted.

## 2018-04-27 ENCOUNTER — Other Ambulatory Visit: Payer: Self-pay | Admitting: Radiation Oncology

## 2018-04-27 DIAGNOSIS — Z171 Estrogen receptor negative status [ER-]: Principal | ICD-10-CM

## 2018-04-27 DIAGNOSIS — C50412 Malignant neoplasm of upper-outer quadrant of left female breast: Secondary | ICD-10-CM

## 2018-05-04 ENCOUNTER — Other Ambulatory Visit: Payer: Self-pay | Admitting: *Deleted

## 2018-05-04 DIAGNOSIS — M25562 Pain in left knee: Principal | ICD-10-CM

## 2018-05-04 DIAGNOSIS — M25561 Pain in right knee: Secondary | ICD-10-CM

## 2018-05-04 MED ORDER — LIDOCAINE 5 % EX PTCH: 0.5000 | MEDICATED_PATCH | Freq: Every day | CUTANEOUS | 2 refills | Status: DC | PRN

## 2018-05-07 ENCOUNTER — Telehealth: Payer: Self-pay

## 2018-05-07 NOTE — Telephone Encounter (Signed)
I called and informed Mary Wise that Dr. Isidore Moos has spoken with Dr. Sherlynn Stalls and they both wanted to encourage her to continue the shoulder exercises that Dr. Sherlynn Stalls recommended. I also let her know that Dr. Isidore Moos will see her on 10/21 for an appointment and then a simulation appointment afterwards to plan her radiation. Radiation would then start several days afterwards and she would receive those appointments during her simulation. I left my direct contact number if she had any questions or concerns.

## 2018-05-13 ENCOUNTER — Ambulatory Visit (HOSPITAL_COMMUNITY)
Admission: RE | Admit: 2018-05-13 | Discharge: 2018-05-13 | Disposition: A | Discharge status: Home / Self Care | Payer: Medicare Other | Source: Ambulatory Visit | Attending: Hematology | Admitting: Hematology

## 2018-05-13 ENCOUNTER — Encounter (HOSPITAL_COMMUNITY): Payer: Self-pay

## 2018-05-13 DIAGNOSIS — F419 Anxiety disorder, unspecified: Secondary | ICD-10-CM | POA: Insufficient documentation

## 2018-05-13 DIAGNOSIS — Z8041 Family history of malignant neoplasm of ovary: Secondary | ICD-10-CM | POA: Insufficient documentation

## 2018-05-13 DIAGNOSIS — G43909 Migraine, unspecified, not intractable, without status migrainosus: Secondary | ICD-10-CM | POA: Insufficient documentation

## 2018-05-13 DIAGNOSIS — Z801 Family history of malignant neoplasm of trachea, bronchus and lung: Secondary | ICD-10-CM | POA: Insufficient documentation

## 2018-05-13 DIAGNOSIS — Z171 Estrogen receptor negative status [ER-]: Secondary | ICD-10-CM | POA: Diagnosis not present

## 2018-05-13 DIAGNOSIS — J45909 Unspecified asthma, uncomplicated: Secondary | ICD-10-CM | POA: Diagnosis not present

## 2018-05-13 DIAGNOSIS — Z808 Family history of malignant neoplasm of other organs or systems: Secondary | ICD-10-CM | POA: Diagnosis not present

## 2018-05-13 DIAGNOSIS — C50412 Malignant neoplasm of upper-outer quadrant of left female breast: Secondary | ICD-10-CM | POA: Diagnosis not present

## 2018-05-13 DIAGNOSIS — Z452 Encounter for adjustment and management of vascular access device: Secondary | ICD-10-CM | POA: Insufficient documentation

## 2018-05-13 DIAGNOSIS — Z888 Allergy status to other drugs, medicaments and biological substances status: Secondary | ICD-10-CM | POA: Insufficient documentation

## 2018-05-13 DIAGNOSIS — Z9889 Other specified postprocedural states: Secondary | ICD-10-CM | POA: Diagnosis not present

## 2018-05-13 DIAGNOSIS — Z881 Allergy status to other antibiotic agents status: Secondary | ICD-10-CM | POA: Insufficient documentation

## 2018-05-13 DIAGNOSIS — Z809 Family history of malignant neoplasm, unspecified: Secondary | ICD-10-CM | POA: Insufficient documentation

## 2018-05-13 DIAGNOSIS — Z880 Allergy status to penicillin: Secondary | ICD-10-CM | POA: Insufficient documentation

## 2018-05-13 DIAGNOSIS — Z882 Allergy status to sulfonamides status: Secondary | ICD-10-CM | POA: Insufficient documentation

## 2018-05-13 DIAGNOSIS — Z803 Family history of malignant neoplasm of breast: Secondary | ICD-10-CM | POA: Insufficient documentation

## 2018-05-13 DIAGNOSIS — Z800 Family history of malignant neoplasm of digestive organs: Secondary | ICD-10-CM | POA: Insufficient documentation

## 2018-05-13 DIAGNOSIS — Z79899 Other long term (current) drug therapy: Secondary | ICD-10-CM | POA: Diagnosis not present

## 2018-05-13 DIAGNOSIS — R569 Unspecified convulsions: Secondary | ICD-10-CM | POA: Insufficient documentation

## 2018-05-13 DIAGNOSIS — C50912 Malignant neoplasm of unspecified site of left female breast: Secondary | ICD-10-CM | POA: Diagnosis not present

## 2018-05-13 DIAGNOSIS — Z87891 Personal history of nicotine dependence: Secondary | ICD-10-CM | POA: Insufficient documentation

## 2018-05-13 HISTORY — PX: IR REMOVAL TUN ACCESS W/ PORT W/O FL MOD SED: IMG2290

## 2018-05-13 LAB — CBC
HCT: 39.3 % (ref 36.0–46.0)
HEMOGLOBIN: 12.7 g/dL (ref 12.0–15.0)
MCH: 28.4 pg (ref 26.0–34.0)
MCHC: 32.3 g/dL (ref 30.0–36.0)
MCV: 87.9 fL (ref 80.0–100.0)
NRBC: 0 % (ref 0.0–0.2)
Platelets: 299 10*3/uL (ref 150–400)
RBC: 4.47 MIL/uL (ref 3.87–5.11)
RDW: 13.1 % (ref 11.5–15.5)
WBC: 5.6 10*3/uL (ref 4.0–10.5)

## 2018-05-13 LAB — APTT: APTT: 25 s (ref 24–36)

## 2018-05-13 LAB — PROTIME-INR
INR: 0.88
Prothrombin Time: 11.9 seconds (ref 11.4–15.2)

## 2018-05-13 MED ORDER — MIDAZOLAM HCL 2 MG/2ML IJ SOLN
INTRAMUSCULAR | Status: AC | PRN
  Administered 2018-05-13 (×2): 1 mg via INTRAVENOUS

## 2018-05-13 MED ORDER — FENTANYL CITRATE (PF) 100 MCG/2ML IJ SOLN
INTRAMUSCULAR | Status: AC
  Filled 2018-05-13: qty 4

## 2018-05-13 MED ORDER — FENTANYL CITRATE (PF) 100 MCG/2ML IJ SOLN
INTRAMUSCULAR | Status: AC | PRN
  Administered 2018-05-13 (×3): 50 ug via INTRAVENOUS

## 2018-05-13 MED ORDER — LIDOCAINE HCL 1 % IJ SOLN
INTRAMUSCULAR | Status: AC
  Filled 2018-05-13: qty 20

## 2018-05-13 MED ORDER — SODIUM CHLORIDE 0.9 % IV SOLN
INTRAVENOUS | Status: DC
  Administered 2018-05-13: 13:00:00 via INTRAVENOUS

## 2018-05-13 MED ORDER — MIDAZOLAM HCL 2 MG/2ML IJ SOLN
INTRAMUSCULAR | Status: AC
  Filled 2018-05-13: qty 4

## 2018-05-13 MED ORDER — VANCOMYCIN HCL IN DEXTROSE 1-5 GM/200ML-% IV SOLN
1000.0000 mg | Freq: Once | INTRAVENOUS | Status: AC
  Administered 2018-05-13: 1000 mg via INTRAVENOUS
  Filled 2018-05-13: qty 200

## 2018-05-13 NOTE — Sedation Documentation (Signed)
Patient is resting comfortably with eyes closed in NAD. 

## 2018-05-13 NOTE — Discharge Instructions (Addendum)
Moderate Conscious Sedation, Adult, Care After These instructions provide you with information about caring for yourself after your procedure. Your health care provider may also give you more specific instructions. Your treatment has been planned according to current medical practices, but problems sometimes occur. Call your health care provider if you have any problems or questions after your procedure. What can I expect after the procedure? After your procedure, it is common:  To feel sleepy for several hours.  To feel clumsy and have poor balance for several hours.  To have poor judgment for several hours.  To vomit if you eat too soon.  Follow these instructions at home: For at least 24 hours after the procedure:   Do not: ? Participate in activities where you could fall or become injured. ? Drive. ? Use heavy machinery. ? Drink alcohol. ? Take sleeping pills or medicines that cause drowsiness. ? Make important decisions or sign legal documents. ? Take care of children on your own.  Rest. Eating and drinking  Follow the diet recommended by your health care provider.  If you vomit: ? Drink water, juice, or soup when you can drink without vomiting. ? Make sure you have little or no nausea before eating solid foods. General instructions  Have a responsible adult stay with you until you are awake and alert.  Take over-the-counter and prescription medicines only as told by your health care provider.  If you smoke, do not smoke without supervision.  Keep all follow-up visits as told by your health care provider. This is important. Contact a health care provider if:  You keep feeling nauseous or you keep vomiting.  You feel light-headed.  You develop a rash.  You have a fever. Get help right away if:  You have trouble breathing. This information is not intended to replace advice given to you by your health care provider. Make sure you discuss any questions you have  with your health care provider. Document Released: 05/11/2013 Document Revised: 12/24/2015 Document Reviewed: 11/10/2015 Elsevier Interactive Patient Education  2018 [employer].  May remove dressing tomorrow after 4:00 pm. Shower.

## 2018-05-13 NOTE — H&P (Signed)
Chief Complaint: Patient was seen in consultation today for breast cancer.  Referring Physician(s): Feng,Yan  Supervising Physician: Marybelle Killings  Patient Status: [facility] - Out-pt  History of Present Illness: Mary Wise is a 67 y.o. female with a past medical history of seizures, migraines, asthma, hemorrhoids, breast cancer, and anxiety. Unfortunately, patient was diagnosed with left breast cancer in 06/2017. Her cancer has been managed by Dr. Burr Medico She had a Port-a-cath placed in IR 10/05/2017 by Dr. Anselm Pancoast. Since, she has completed chemotherapy.  IR requested by Dr. Burr Medico for possible image-guided Port-a-cath removal. Patient awake and alert sitting in bed. Complains of dyspnea. States that she has allergies that cause shortness of breath. States this is normal for her and stable at this time. Denies fever, chills, chest pain, abdominal pain, N/V, dizziness, or headache.   Past Medical History:  Diagnosis Date  . Anxiety   . Asthma   . Cancer [facility])    left breast cancer  . Family history of cancer   . Genetic testing 02/01/2018   Multi-Cancer panel (83 genes) @ [facility] - No pathogenic mutations detected  . Hemorrhoids   . Lipoma of left shoulder   . Migraine   . Migraine   . 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 ABDOMINAL HYSTERECTOMY    . APPENDECTOMY    . CESAREAN SECTION    . CHOLECYSTECTOMY    . ERCP    . IR FLUORO GUIDE PORT INSERTION RIGHT  10/05/2017  . IR US GUIDE VASC ACCESS RIGHT  10/05/2017  . LIPOMA EXCISION Left 03/16/2018   left shoulder  . LIVER SURGERY    . PARTIAL MASTECTOMY WITH AXILLARY SENTINEL LYMPH NODE BIOPSY Left 07/31/2017   Procedure: LEFT PARTIAL MASTECTOMY WITH AXILLARY SENTINEL LYMPH NODE BIOPSY;  Surgeon: Virl Cagey, MD;  Location: [facility];  Service: General;  Laterality: Left;    Allergies: Onion; Penicillins; Sulfa antibiotics; Yellow dye; Tape; and  Clindamycin/lincomycin  Medications: Prior to Admission medications   Medication Sig Start Date End Date Taking? Authorizing Provider  albuterol (PROVENTIL HFA;VENTOLIN HFA) 108 (90 Base) MCG/ACT inhaler Inhale 2 puffs into the lungs every 6 (six) hours as needed for wheezing or shortness of breath. 12/08/17   Alla Feeling, NP  ALPRAZolam Duanne Moron) 1 MG tablet TAKE 1 TABLET BY MOUTH ONCE DAILY AS NEEDED FOR ANXIETY 02/23/18   Truitt Merle, MD  gabapentin (NEURONTIN) 100 MG capsule Take 1 capsule (100 mg total) by mouth 3 (three) times daily. Take 1 capsule in am, 2 capsules in pm and 2 capsules at bedtime. 03/29/18   Truitt Merle, MD  HYDROcodone-acetaminophen (NORCO) 5-325 MG tablet Take 1 tablet by mouth every 6 (six) hours as needed for moderate pain. Patient not taking: Reported on 03/23/2018 11/09/17   Truitt Merle, MD  Hypromellose (ARTIFICIAL TEARS OP) Apply to eye 2 (two) times daily. Doesn't do drops in eyes does as a wash to flush eyes out    [provider]  ibuprofen (ADVIL,MOTRIN) 800 MG tablet Take 800 mg by mouth every 8 (eight) hours as needed.    [provider]  lidocaine (LIDODERM) 5 % Place 0.5-1 patches onto the skin daily as needed (pain). Remove & Discard patch within 12 hours or as directed by MD 05/04/18   Alla Feeling, NP  lidocaine-prilocaine (EMLA) cream Apply 1 application topically as needed. 10/02/17   Truitt Merle, MD  Menthol, Topical Analgesic, (BIOFREEZE EX) Apply 1 application topically as needed (pain).    [provider]  mirtazapine (REMERON) 7.5 MG tablet Take 1 tablet (7.5 mg total) by mouth at bedtime. Patient not taking: Reported on 03/23/2018 10/26/17   Alla Feeling, NP  ondansetron (ZOFRAN) 8 MG tablet Take 1 tablet (8 mg total) by mouth 2 (two) times daily as needed. Start on the third day after chemotherapy. 11/09/17   Truitt Merle, MD  oxyCODONE (OXY IR/ROXICODONE) 5 MG immediate release tablet Take 1 tablet (5 mg total) by mouth every 6 (six)  hours as needed for severe pain. 01/18/18   Alla Feeling, NP  prochlorperazine (COMPAZINE) 10 MG tablet Take 1 tablet (10 mg total) by mouth every 6 (six) hours as needed for nausea or vomiting. Patient not taking: Reported on 03/23/2018 11/23/17   Truitt Merle, MD  SUMAtriptan (IMITREX) 100 MG tablet Take 1 tablet 100mg   By mouth at onset of migraine, If symptoms persist 2 hours later then take additional dose.Maximum 2 per 24 hours 02/23/18   Dena Billet B, PA-C  zolpidem (AMBIEN) 10 MG tablet Take 1 tablet (10 mg total) by mouth at bedtime as needed for sleep. 02/01/18 03/03/18  Dena Billet B, PA-C  zolpidem (AMBIEN) 10 MG tablet Take by mouth.    [provider]     Family History  Problem Relation Age of Onset  . Cancer Mother 43       unk. primary; deceased 50  . Cancer Father 23       brain cancer; deceased 29  . Ovarian cancer Sister 14       deceased 110  . Diabetes Brother   . Breast cancer Maternal Grandmother        dx 75s; deceased 13  . Cancer Other        "female ca"  . Diabetes Sister   . Diabetes Sister   . Diabetes Sister   . Cancer Paternal Uncle        brain cancer   . Cancer Paternal Grandmother        "female ca"  . Cancer Paternal Grandfather        lung cancer  . Cancer Paternal Uncle        brain tumor   . Cancer Paternal Uncle        brain tumor   . Stomach cancer Maternal Aunt 87       deceased 37  . Cancer Paternal Aunt        ink. primary in 2 aunts  . Melanoma Other   . Lung cancer Maternal Aunt   . Stomach cancer Cousin        2 sons of an unaffected maternal uncle    Social History   Socioeconomic History  . Marital status: Single    Spouse name: Not on file  . Number of children: Not on file  . Years of education: Not on file  . Highest education level: Not on file  Occupational History  . Not on file  Social Needs  . Financial resource strain: Not on file  . Food insecurity:    Worry: Not on file    Inability: Not on file    . Transportation needs:    Medical: Not on file    Non-medical: Not on file  Tobacco Use  . Smoking status: Former Smoker    Packs/day: 0.25    Years: 9.00    Pack years: 2.25  Last attempt to quit: 1982    Years since quitting: 37.7  . Smokeless tobacco: Never Used  Substance and Sexual Activity  . Alcohol use: No  . Drug use: No  . Sexual activity: Never  Lifestyle  . Physical activity:    Days per week: Not on file    Minutes per session: Not on file  . Stress: Not on file  Relationships  . Social connections:    Talks on phone: Not on file    Gets together: Not on file    Attends religious service: Not on file    Active member of club or organization: Not on file    Attends meetings of clubs or organizations: Not on file    Relationship status: Not on file  Other Topics Concern  . Not on file  Social History Narrative   Lives in [place].      Review of Systems: A 12 point ROS discussed and pertinent positives are indicated in the HPI above.  All other systems are negative.  Review of Systems  Constitutional: Negative for chills and fever.  Respiratory: Positive for shortness of breath. Negative for wheezing.   Cardiovascular: Negative for chest pain and palpitations.  Gastrointestinal: Negative for abdominal pain, nausea and vomiting.  Neurological: Negative for dizziness and headaches.  Psychiatric/Behavioral: Negative for behavioral problems and confusion.    Vital Signs: BP (!) 158/95   Pulse 80   Temp 98.2 F (36.8 C) (Oral)   Resp 18   SpO2 99%   Physical Exam  Constitutional: She is oriented to person, place, and time. She appears well-developed and well-nourished. No distress.  Cardiovascular: Normal rate, regular rhythm and normal heart sounds.  No murmur heard. Pulmonary/Chest: Effort normal and breath sounds normal. No respiratory distress. She has no wheezes.  Neurological: She is alert and oriented to person, place,  and time.  Skin: Skin is warm and dry.  Psychiatric: She has a normal mood and affect. Her behavior is normal. Judgment and thought content normal.  Nursing note and vitals reviewed.    MD Evaluation Airway: WNL Heart: WNL Abdomen: WNL Chest/ Lungs: WNL ASA  Classification: 3 Mallampati/Airway Score: Two   Imaging: No results found.  Labs:  CBC: Recent Labs    01/04/18 0928 01/18/18 0941 01/25/18 0950 02/23/18 1215  WBC 2.7* 3.2* 3.6* 5.4  HGB 11.3* 11.7 11.0* 12.9  HCT 33.7* 35.5 33.4* 39.1  PLT 294 303 306 313    COAGS: Recent Labs    10/05/17 0953  INR 0.98  APTT 26    BMP: Recent Labs    01/04/18 0928 01/18/18 0941 01/25/18 0950 02/23/18 1215  NA 142 142 140 140  K 3.4* 3.8 3.6 4.0  CL 107 106 103 105  CO2 26 26 27 24   GLUCOSE 107 95 99 81  BUN 12 10 12 12   CALCIUM 9.2 9.8 9.3 9.3  CREATININE 0.80 0.79 0.77 0.72  GFRNONAA >60 >60 >60 >60  GFRAA >60 >60 >60 >60    LIVER FUNCTION TESTS: Recent Labs    01/04/18 0928 01/18/18 0941 01/25/18 0950 02/23/18 1215  BILITOT 0.3 0.3 <0.2* <0.2*  AST 22 29 23 26   ALT 24 28 30 26   ALKPHOS 108 106 100 111  PROT 7.1 7.4 7.1 7.6  ALBUMIN 3.8 4.0 3.9 4.2    TUMOR MARKERS: No results for input(s): AFPTM, CEA, CA199, CHROMGRNA in the last 8760 hours.  Assessment and Plan:  Breast cancer. Completion  of chemotherapy. Plan for image-guided Port-a-cath removal today with Dr. Barbie Banner. Patient is NPO. Afebrile. She does not take blood thinners. INR pending.  Risks and benefits of image guided port-a-catheter removal was discussed with the patient including, but not limited to bleeding, infection,or pneumothorax. All of the patient's questions were answered, patient is agreeable to proceed. Consent signed and in chart.   Thank you for this interesting consult.  I greatly enjoyed meeting Mary Wise and look forward to participating in their care.  A copy of this report was sent to the requesting  provider on this date.  Electronically Signed: Earley Abide, PA-C 05/13/2018, 1:22 PM   I spent a total of 25 Minutes in face to face in clinical consultation, greater than 50% of which was counseling/coordinating care for breast cancer.

## 2018-05-13 NOTE — Procedures (Signed)
RIJV PAC removal EBL 0 Comp 0 

## 2018-05-14 NOTE — Progress Notes (Signed)
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  Receptor Status: ER(NEG), PR (NEG), Her2-neu (NEG), Ki-(90%) 07/31/17 Diagnosis 1. Lymph node, sentinel, biopsy, left - ONE OF ONE LYMPH NODES NEGATIVE FOR CARCINOMA (0/1). 2. 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 - INVASIVE CARCINOMA IS <0.1 CM OF THE ANTERIOR MARGIN BROADLY. - SEE ONCOLOGY TABLE. 3. Breast, excision, left deep - BENIGN BREAST TISSUE. 4. Breast, excision, left superior - BENIGN BREAST TISSUE. 5. Breast, excision, left medial - BENIGN BREAST TISSUE. 6. Breast, excision, left inferior - BENIGN BREAST TISSUE. 7. Breast, excision, left lateral - BENIGN BREAST TISSUE. 8. Lymph node, sentinel, biopsy - ONE OF ONE LYMPH NODES NEGATIVE FOR CARCINOMA (0/1). 9. Lymph node, sentinel, biopsy - ONE OF ONE LYMPH NODES NEGATIVE FOR CARCINOMA (0/1). 10. Lymph node, sentinel, biopsy - ONE OF ONE LYMPH NODES NEGATIVE FOR CARCINOMA (0/1). 11. Lymph node, sentinel, biopsy - ONE OF ONE LYMPH NODES NEGATIVE FOR CARCINOMA (0/1).   Did patient present with symptoms or was this found on screening mammography?:She "bumped" her Left breast lightly and then a second time. Her Breast turned darker after this injury and she presented to her PCP.  Past/Anticipated interventions by surgeon, if any: 07/31/17 [facility]. Procedure(s) Performed:Partial mastectomy with sentinel lymph node biopsy following Radiotracer injection by Radiology  Surgeon: Lanell Matar. Constance Haw, MD  Past/Anticipated interventions by medical oncology, if any:  10/11/2017 -  Chemotherapy    Adjuvant chemotherapy AC every 2 weeks for 4 cycles 10/12/17-11/23/17, followed by bi-weekly Taxol for 4 cycles starting 12/08/17.   02/23/18 Dr. Burr Medico Plan -I refilled gabapentin and xanax  today -IR port removal in 1-2 weeks(done 05/13/18) -Lab and f/u in 2-3 months -she will have lipoma surgery at [facility] on 8/12 -she will see Dr. Isidore Moos in mid August for adjuvant radiation  --She will see Dr. Burr Medico today for follow up.  Dr. Burr Medico feels like she is ready to start radiation.   Lymphedema issues, if any: She denies.   Pain issues, if any:She reports chronic pain to her bilateral legs and knees.She had a left shoulder lipoma removed 03/16/18. She is unable to move her left arm. She is attending PT to improve left arm mobility.   SAFETY ISSUES:  Prior radiation?No  Pacemaker/ICD?No  Possiblecurrent pregnancy?No  Is the patient on methotrexate?No  Current Complaints / other details:  PAC removal 05/13/18  Vital signs from Dr. Ernestina Penna visit.  97.8, 156/97, 76 heart rate, 20 respirations, 100 room air.   Wt Readings from Last 3 Encounters:  05/24/18 192 lb 8 oz (87.3 kg)  03/23/18 191 lb 3.2 oz (86.7 kg)  02/23/18 191 lb 9.6 oz (86.9 kg)

## 2018-05-24 ENCOUNTER — Ambulatory Visit
Admission: RE | Admit: 2018-05-24 | Discharge: 2018-05-24 | Disposition: A | Discharge status: Home / Self Care | Payer: Medicare Other | Source: Ambulatory Visit | Attending: Radiation Oncology | Admitting: Radiation Oncology

## 2018-05-24 ENCOUNTER — Other Ambulatory Visit: Payer: Self-pay

## 2018-05-24 ENCOUNTER — Telehealth: Payer: Self-pay | Admitting: Hematology

## 2018-05-24 ENCOUNTER — Inpatient Hospital Stay (HOSPITAL_BASED_OUTPATIENT_CLINIC_OR_DEPARTMENT_OTHER): Payer: Medicare Other | Admitting: Hematology

## 2018-05-24 ENCOUNTER — Inpatient Hospital Stay: Payer: Medicare Other | Attending: Hematology

## 2018-05-24 ENCOUNTER — Encounter: Payer: Self-pay | Admitting: Hematology

## 2018-05-24 VITALS — BP 156/97 | HR 76 | Temp 97.8°F | Resp 20 | Ht 66.5 in | Wt 192.5 lb

## 2018-05-24 DIAGNOSIS — F419 Anxiety disorder, unspecified: Secondary | ICD-10-CM

## 2018-05-24 DIAGNOSIS — Z510 Encounter for antineoplastic radiation therapy: Secondary | ICD-10-CM | POA: Diagnosis not present

## 2018-05-24 DIAGNOSIS — D1722 Benign lipomatous neoplasm of skin and subcutaneous tissue of left arm: Secondary | ICD-10-CM

## 2018-05-24 DIAGNOSIS — Z171 Estrogen receptor negative status [ER-]: Secondary | ICD-10-CM

## 2018-05-24 DIAGNOSIS — C50412 Malignant neoplasm of upper-outer quadrant of left female breast: Secondary | ICD-10-CM | POA: Insufficient documentation

## 2018-05-24 DIAGNOSIS — Z87891 Personal history of nicotine dependence: Secondary | ICD-10-CM | POA: Insufficient documentation

## 2018-05-24 DIAGNOSIS — Z79899 Other long term (current) drug therapy: Secondary | ICD-10-CM | POA: Insufficient documentation

## 2018-05-24 DIAGNOSIS — Z882 Allergy status to sulfonamides status: Secondary | ICD-10-CM | POA: Diagnosis not present

## 2018-05-24 DIAGNOSIS — G4700 Insomnia, unspecified: Secondary | ICD-10-CM

## 2018-05-24 DIAGNOSIS — Z888 Allergy status to other drugs, medicaments and biological substances status: Secondary | ICD-10-CM | POA: Diagnosis not present

## 2018-05-24 DIAGNOSIS — Z880 Allergy status to penicillin: Secondary | ICD-10-CM | POA: Diagnosis not present

## 2018-05-24 DIAGNOSIS — Z9221 Personal history of antineoplastic chemotherapy: Secondary | ICD-10-CM | POA: Diagnosis not present

## 2018-05-24 DIAGNOSIS — L7682 Other postprocedural complications of skin and subcutaneous tissue: Secondary | ICD-10-CM | POA: Diagnosis not present

## 2018-05-24 DIAGNOSIS — Z881 Allergy status to other antibiotic agents status: Secondary | ICD-10-CM | POA: Insufficient documentation

## 2018-05-24 DIAGNOSIS — G620 Drug-induced polyneuropathy: Secondary | ICD-10-CM

## 2018-05-24 LAB — CBC WITH DIFFERENTIAL (CANCER CENTER ONLY)
ABS IMMATURE GRANULOCYTES: 0.02 10*3/uL (ref 0.00–0.07)
BASOS PCT: 1 %
Basophils Absolute: 0.1 10*3/uL (ref 0.0–0.1)
Eosinophils Absolute: 0.2 10*3/uL (ref 0.0–0.5)
Eosinophils Relative: 3 %
HCT: 40 % (ref 36.0–46.0)
Hemoglobin: 12.9 g/dL (ref 12.0–15.0)
IMMATURE GRANULOCYTES: 0 %
Lymphocytes Relative: 39 %
Lymphs Abs: 2.5 10*3/uL (ref 0.7–4.0)
MCH: 28.7 pg (ref 26.0–34.0)
MCHC: 32.3 g/dL (ref 30.0–36.0)
MCV: 88.9 fL (ref 80.0–100.0)
Monocytes Absolute: 0.4 10*3/uL (ref 0.1–1.0)
Monocytes Relative: 7 %
NEUTROS ABS: 3.2 10*3/uL (ref 1.7–7.7)
NEUTROS PCT: 50 %
PLATELETS: 306 10*3/uL (ref 150–400)
RBC: 4.5 MIL/uL (ref 3.87–5.11)
RDW: 13.2 % (ref 11.5–15.5)
WBC: 6.4 10*3/uL (ref 4.0–10.5)
nRBC: 0 % (ref 0.0–0.2)

## 2018-05-24 LAB — CMP (CANCER CENTER ONLY)
ALBUMIN: 4 g/dL (ref 3.5–5.0)
ALT: 17 U/L (ref 0–44)
ANION GAP: 11 (ref 5–15)
AST: 18 U/L (ref 15–41)
Alkaline Phosphatase: 123 U/L (ref 38–126)
BUN: 10 mg/dL (ref 8–23)
CHLORIDE: 105 mmol/L (ref 98–111)
CO2: 26 mmol/L (ref 22–32)
Calcium: 9.7 mg/dL (ref 8.9–10.3)
Creatinine: 0.78 mg/dL (ref 0.44–1.00)
GFR, Est AFR Am: >60 mL/min (ref >60)
GFR, Estimated: >60 mL/min (ref >60)
GLUCOSE: 88 mg/dL (ref 70–99)
Potassium: 3.7 mmol/L (ref 3.5–5.1)
Sodium: 142 mmol/L (ref 135–145)
TOTAL PROTEIN: 7.8 g/dL (ref 6.5–8.1)
Total Bilirubin: 0.3 mg/dL (ref 0.3–1.2)

## 2018-05-24 MED ORDER — ALPRAZOLAM 1 MG PO TABS: ORAL_TABLET | ORAL | 1 refills | Status: DC

## 2018-05-24 MED ORDER — ZOLPIDEM TARTRATE 10 MG PO TABS: 10.0000 mg | ORAL_TABLET | Freq: Every evening | ORAL | 3 refills | Status: DC | PRN

## 2018-05-24 MED ORDER — ALBUTEROL SULFATE (2.5 MG/3ML) 0.083% IN NEBU: 2.5000 mg | INHALATION_SOLUTION | Freq: Four times a day (QID) | RESPIRATORY_TRACT | 3 refills | Status: DC | PRN

## 2018-05-24 NOTE — Progress Notes (Signed)
  Radiation Oncology         (336) [phone] ________________________________  Name: Mary Wise MRN: [id number]  Date: 05/24/2018  DOB: 09/26/1950  SIMULATION AND TREATMENT PLANNING NOTE    outpatient  DIAGNOSIS:     ICD-10-CM   1. Malignant neoplasm of upper-outer quadrant of left breast in female, estrogen receptor negative ([place]) C50.412    Z17.1     NARRATIVE:  The patient was brought to the [facility].  Identity was confirmed.  All relevant records and images related to the planned course of therapy were reviewed.  The patient freely provided informed written consent to proceed with treatment after reviewing the details related to the planned course of therapy. The consent form was witnessed and verified by the simulation staff.    Then, the patient was set-up in a stable reproducible supine position for radiation therapy with her ipsilateral arm over her head, and her upper body secured in a custom-made Vac-lok device.  CT images were obtained.  Surface markings were placed.  The CT images were loaded into the planning software.    TREATMENT PLANNING NOTE: Treatment planning then occurred.  The radiation prescription was entered and confirmed.     A total of 3 medically necessary complex treatment devices were fabricated and supervised by me: 2 fields with MLCs for custom blocks to protect heart, and lungs;  and, a Vac-lok. MORE COMPLEX DEVICES MAY BE MADE IN DOSIMETRY FOR FIELD IN FIELD BEAMS FOR DOSE HOMOGENEITY.  I have requested : 3D Simulation which is medically necessary to give adequate dose to at risk tissues while sparing lungs and heart.  I have requested a DVH of the following structures: lungs, heart, left lumpectomy cavity.    The patient will receive 40.05 Gy in 15 fractions to the left breast with 2 tangential fields.  This will be followed by a boost.  Optical Surface Tracking Plan:  Since intensity modulated radiotherapy (IMRT) and 3D conformal  radiation treatment methods are predicated on accurate and precise positioning for treatment, intrafraction motion monitoring is medically necessary to ensure accurate and safe treatment delivery. The ability to quantify intrafraction motion without excessive ionizing radiation dose can only be performed with optical surface tracking. Accordingly, surface imaging offers the opportunity to obtain 3D measurements of patient position throughout IMRT and 3D treatments without excessive radiation exposure. I am ordering optical surface tracking for this patient's upcoming course of radiotherapy.  ________________________________   Reference:  Ursula Alert, J, et al. Surface imaging-based analysis of intrafraction motion for breast radiotherapy patients.Journal of [place], n. 6, nov. 2014. ISSN [id number].  Available at: <http://www.jacmp.org/index.php/jacmp/article/view/4957>.    -----------------------------------  Eppie Gibson, MD

## 2018-05-24 NOTE — Progress Notes (Signed)
Radiation Oncology         (336) [phone] ________________________________  Name: Mary Wise MRN: [id number]  Date: 05/24/2018  DOB: 28-Dec-1950  Follow-Up Visit Note  Outpatient  CC: Monia Pouch, MD  Diagnosis:      ICD-10-CM   1. Malignant neoplasm of upper-outer quadrant of left breast in female, estrogen receptor negative ([place]) C50.412    Z17.1    Cancer Staging Malignant neoplasm of upper-outer quadrant of left breast in female, estrogen receptor negative ([place]) Staging form: Breast, AJCC 8th Edition - Pathologic stage from 08/19/2017: Stage IIA (pT2, pN0(sn), cM0, G3, ER: Negative, PR: Negative, HER2: Negative) - Signed by Ardath Sax, MD on 09/01/2017    CHIEF COMPLAINT: Here to discuss management of left breast cancer  Narrative:  The patient returns today for follow-up.   Lymphedema issues, if any: She denies.  Pain issues, if any:She reports chronic pain to her bilateral legs and knees.She had a left shoulder lipoma removed 03/16/18. I have conferred with her orthopedic surgeon and she is now cleared to plan RT for her left breast cancer.  She is attending PT to improve left arm mobility and has made excellent progress in abducting her left arm. She completed chemotherapy months ago.          ALLERGIES:  is allergic to onion; penicillins; sulfa antibiotics; yellow dye; tape; and clindamycin/lincomycin.  Meds: Current Outpatient Medications  Medication Sig Dispense Refill  . albuterol (PROVENTIL HFA;VENTOLIN HFA) 108 (90 Base) MCG/ACT inhaler Inhale 2 puffs into the lungs every 6 (six) hours as needed for wheezing or shortness of breath. 6.7 g 2  . albuterol (PROVENTIL) (2.5 MG/3ML) 0.083% nebulizer solution Take 3 mLs (2.5 mg total) by nebulization every 6 (six) hours as needed for wheezing or shortness of breath. 90 mL 3  . ALPRAZolam (XANAX) 1 MG tablet TAKE 1 TABLET BY MOUTH TWICE DAILY AS NEEDED FOR ANXIETY 60 tablet 1  . gabapentin  (NEURONTIN) 100 MG capsule Take 1 capsule (100 mg total) by mouth 3 (three) times daily. Take 1 capsule in am, 2 capsules in pm and 2 capsules at bedtime. 150 capsule 3  . HYDROcodone-acetaminophen (NORCO) 5-325 MG tablet Take 1 tablet by mouth every 6 (six) hours as needed for moderate pain. (Patient not taking: Reported on 03/23/2018) 30 tablet 0  . Hypromellose (ARTIFICIAL TEARS OP) Apply to eye 2 (two) times daily. Doesn't do drops in eyes does as a wash to flush eyes out    . ibuprofen (ADVIL,MOTRIN) 800 MG tablet Take 800 mg by mouth every 8 (eight) hours as needed.    . lidocaine (LIDODERM) 5 % Place 0.5-1 patches onto the skin daily as needed (pain). Remove & Discard patch within 12 hours or as directed by MD (Patient not taking: Reported on 05/24/2018) 30 patch 2  . lidocaine-prilocaine (EMLA) cream Apply 1 application topically as needed. (Patient not taking: Reported on 05/24/2018) 30 g 2  . Menthol, Topical Analgesic, (BIOFREEZE EX) Apply 1 application topically as needed (pain).    . mirtazapine (REMERON) 7.5 MG tablet Take 1 tablet (7.5 mg total) by mouth at bedtime. (Patient not taking: Reported on 03/23/2018) 30 tablet 0  . ondansetron (ZOFRAN) 8 MG tablet Take 1 tablet (8 mg total) by mouth 2 (two) times daily as needed. Start on the third day after chemotherapy. (Patient not taking: Reported on 05/24/2018) 30 tablet 3  . oxyCODONE (OXY IR/ROXICODONE) 5 MG immediate release tablet Take 1  tablet (5 mg total) by mouth every 6 (six) hours as needed for severe pain. (Patient not taking: Reported on 05/24/2018) 10 tablet 0  . prochlorperazine (COMPAZINE) 10 MG tablet Take 1 tablet (10 mg total) by mouth every 6 (six) hours as needed for nausea or vomiting. (Patient not taking: Reported on 03/23/2018) 30 tablet 2  . SUMAtriptan (IMITREX) 100 MG tablet Take 1 tablet 18m  By mouth at onset of migraine, If symptoms persist 2 hours later then take additional dose.Maximum 2 per 24 hours 9 tablet 5    . zolpidem (AMBIEN) 10 MG tablet Take by mouth.    . zolpidem (AMBIEN) 10 MG tablet Take 1 tablet (10 mg total) by mouth at bedtime as needed for sleep. 30 tablet 3   No current facility-administered medications for this encounter.     Physical Findings:   Vitals with Age-Percentiles 05/24/2018  Length 1470.9cm  Systolic 1628 Diastolic 97  MAP   Pulse 76  Respiration 20  Weight 87.317 kg  BMI 30.61  VISIT REPORT     General: Alert and oriented, in no acute distress HEENT: Head is normocephalic  Musculoskeletal: able to abduct both arms well, and raise both arms over her head Psychiatric: Judgment and insight are intact. Affect is appropriate. Breast exam reveals well healed surgical scars, left breast/axilla  Lab Findings: Lab Results  Component Value Date   WBC 6.4 05/24/2018   HGB 12.9 05/24/2018   HCT 40.0 05/24/2018   MCV 88.9 05/24/2018   PLT 306 05/24/2018       Radiographic Findings: Ir Removal Tun Access W/ Port W/o Fl  Result Date: 05/13/2018 INDICATION: Breast cancer EXAM: REMOVAL RIGHT IJ VEIN PORT-A-CATH MEDICATIONS: Vancomycin 1 g ANESTHESIA/SEDATION: None FLUOROSCOPY TIME:  None COMPLICATIONS: None immediate. PROCEDURE: Informed written consent was obtained from the patient after a thorough discussion of the procedural risks, benefits and alternatives. All questions were addressed. Maximal Sterile Barrier Technique was utilized including caps, mask, sterile gowns, sterile gloves, sterile drape, hand hygiene and skin antiseptic. A timeout was performed prior to the initiation of the procedure. The right chest was prepped and draped in a sterile fashion. Lidocaine was utilized for local anesthesia. An incision was made over the previously healed surgical incision. Utilizing blunt dissection, the port catheter and reservoir were removed from the underlying subcutaneous tissue in their entirety. Securing sutures were also removed. The pocket was irrigated with a  copious amount of sterile normal saline. The pocket was closed with interrupted 3-0 Vicryl stitches. The subcutaneous tissue was closed with 3-0 Vicryl interrupted subcutaneous stitches. A 4-0 Vicryl running subcuticular stitch was utilized to approximate the skin. Dermabond was applied. IMPRESSION: Successful right IJ vein Port-A-Cath explant. Electronically Signed   By: AMarybelle KillingsM.D.   On: 05/13/2018 16:12    Impression/Plan: We again discussed adjuvant radiotherapy today. She is now ready for RT planning. This was delayed due to mobility issues in her left arm after lipoma surgery.  I'll expedite her RT planning so she can start RT this week.   I recommend radiotherapy to the left breast in order to reduce risk of locoregional recurrence by 2/3.  The risks, benefits and side effects of this treatment were discussed in detail.  She understands that radiotherapy is associated with skin irritation and fatigue in the acute setting. Late effects can include cosmetic changes and rare injury to internal organs.   She is enthusiastic about proceeding with treatment. A consent form has been signed  and placed in her chart.  A total of 3 medically necessary complex treatment devices will be fabricated and supervised by me: 2 fields with MLCs for custom blocks to protect heart, and lungs;  and, a Vac-lok. MORE COMPLEX DEVICES MAY BE MADE IN DOSIMETRY FOR FIELD IN FIELD BEAMS FOR DOSE HOMOGENEITY.  I have requested : 3D Simulation which is medically necessary to give adequate dose to at risk tissues while sparing lungs and heart.  I have requested a DVH of the following structures: lungs, heart, left lumpectomy cavity.    The patient will receive 40.05 Gy in 15 fractions to the left breast with 2 fields.  This will be followed by a boost.  I spent 15 minutes minutes face to face with the patient and more than 50% of that time was spent in counseling and/or coordination of  care. _____________________________________   Eppie Gibson, MD

## 2018-05-24 NOTE — Telephone Encounter (Signed)
Scheduled appt per 10/21 los - pt is aware of appt date and time.  - per patient no print out wanted.

## 2018-05-24 NOTE — Progress Notes (Signed)
[place]  [phone] [phone]  Clinic Follow up Note   Patient Care Team: Rennis Golden as PCP - General (Physician Assistant)   Date of Service:  05/24/2018   CHIEF COMPLAINT: F/u triple negative breast cancer   SUMMARY OF ONCOLOGIC HISTORY: Oncology History   Cancer Staging Malignant neoplasm of upper-outer quadrant of left breast in female, estrogen receptor negative ([place]) Staging form: Breast, AJCC 8th Edition - Pathologic stage from 08/19/2017: Stage IIA (pT2, pN0(sn), cM0, G3, ER: Negative, PR: Negative, HER2: Negative) - Signed by Ardath Sax, MD on 09/01/2017       Malignant neoplasm of upper-outer quadrant of left breast in female, estrogen receptor negative ([place])   06/16/2017 Initial Diagnosis    Malignant neoplasm of upper-outer quadrant of left breast in female: Initially presented with left deltoid mass which was consistent with lipoma.  During examination, a mass in the left breast was appreciated by palpation.    06/16/2017 Mammogram    BL Diagnostic Study: Irregular hyperdense mass in the upper outer quadrant of the left breast measuring 3.6 cm without radiographically detectable lymphadenopathy.  Normal appearance of the right breast.    06/16/2017 Breast US    3.2 cm irregular mass located at 2:30 o'clock, no pathological lymphadenopathy.    06/23/2017 Pathology Results    Ultrasound-guided biopsy: Positive for invasive ductal carcinoma, grade 3, ER 0%, PR 0%, HER-2 negative by [facility]    07/31/2017 Surgery    Partial left mastectomy with sentinel lymph node biopsy --Pathology: Invasive ductal carcinoma, measuring 4.5 cm, grade 3.  All margins negative, but the anterior margin less than 0.1 cm.  No surrounding DCIS.  0/5 lymph nodes positive for malignancy    10/08/2017 PET scan    IMPRESSION: 1. No evidence breast cancer metastasis on FDG PET-CT scan. 2. Postsurgical change in the LEFT lateral breast and LEFT  axilla.    10/11/2017 -  Chemotherapy    Adjuvant chemotherapy AC every 2 weeks for 4 cycles 10/12/17-11/23/17, followed by bi-weekly Taxol for 4 cycles starting 12/08/17.     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 Uncertain Significance The following genes were evaluated for sequence changes and exonic deletions/duplications: ALK, APC, ATM, AXIN2, BAP1, BARD1, BLM, BMPR1A, BRCA1, BRCA2, BRIP1, CASR, CDC73, CDH1, CDK4, CDKN1B, CDKN1C, CDKN2A (p14ARF), CDKN2A (p16INK4a), CEBPA, CHEK2, CTNNA1, DICER1, DIS3L2, EPCAM*, FH, FLCN, GATA2, GPC3, GREM1*, HRAS, KIT, MAX, MEN1, MET, MLH1, MSH2, MSH3, MSH6, MUTYH, NBN, NF1, NF2, PALB2, PDGFRA, PHOX2B*, PMS2, POLD1, POLE, POT1, PRKAR1A, PTCH1, PTEN, RAD50, RAD51C, RAD51D, RB1, RECQL4, RET, RUNX1, SDHAF2, SDHB, SDHC, SDHD, SMAD4, SMARCA4, SMARCB1, SMARCE1, STK11, SUFU, TERC, TERT, TMEM127, TP53, TSC1, TSC2, VHL, WRN*, WT1 The following genes were evaluated for sequence changes only: EGFR*, HOXB13*, MITF*, NTHL1*, SDHA Results are negative unless otherwise indicated    HISTORY OF PRESENT ILLNESS: Mary Wise 67 y.o. female presents to the [facility] for follow up her left breast cancer that has recently been surgically resected. The patient was referred by Dr. Isidore Moos.  She was previously seen by my partner Dr. Lebron Conners at [facility].  Patient states she noticed a lump in her left breast after she had knocked into her bed and felt pain in the area. 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 She saw her PCP and during the evaluation, a mass  was discovered in the lateral aspect of the left breast leading to additional evaluation. She had been getting regular mammograms yearly before this with one abnormal result in the past that revealed fatty tissue.  Her diagnostic mammogram from 06/16/17  revealed a suspicious mass in the 230 o'clock location of the left breast with her initial biopsy confirming invasive ductal carcinoma. Prognostic indicators significant for: ER, 0% negative and PR, 0% negative. Proliferation marker Ki67 at 90%. HER2 negative. Pt underwent a left partial mastectomy with sentinel node biopsy with Dr. Constance Haw on 07/31/17. Surgical pathology results revealed invasive ductal carcinoma and DCIS, grade III, spanning 4.5 cm. Lymph nodes biopsied were negative.  She met Dr. Lebron Conners one month ago, adjuvant chemotherapy was recommended, but she declined.  She was seen by radiation oncologist Dr. Isidore Moos yesterday, who encouraged her to consider adjuvant chemotherapy, and referred her to me.  In the past she was diagnosed with medical conditions of anxiety, asthma, seizure and migraines. She has had gallstones that traveled to liver that required surgery, appendectomy, and complete hysterectomy.   She has a FHx of breast cancer, brain cancer, ovarian cancer, throat cancer, and lung cancer. She has a Hx of tobacco use for 9 years over 30 years ago. She  GYN HISTORY  Menarchal: 12 LMP: 42, complete hysterectomy  Contraceptive: HRT:  GP: 1 daughter, Joelene Millin, she works in the ED at [employer] she is divorced and lives by herself in an assisted living facility in [place]. She states she is able to do everything for herself. She enjoys the company of people and she is able to get meals there.   CURRENT THERAPY:  Pending adjuvant breast radiation   INTERVAL HISTORY:  Mary Wise is here for follow up after her lipoma surgry on 03/15/18. She presents to the clinic today accompanied by her friend. She is doing well and recovering well from surgery. She states that she felt warmth at the site of surgery for 2 days, but is better now. She states that she recovered well from chemo, but still experiences numbness and tingling her her bilateral fingers and legs. She takes  Gabapentin for relief. She used albuterol inhaler during spring and fall.  She states that she experiences severe anxiety and insomnia. She used to see a PCP for this, but her PCP moved and she no longer has a PCP. She uses Xanax and Ambien. She is asking for a handicap sticker.   REVIEW OF SYSTEMS:   Constitutional: Denies fevers, chills or abnormal weight loss (+0 hair growth Eyes: Denies blurriness of vision Ears, nose, mouth, throat, and face: Denies mucositis or sore throat Respiratory: Denies cough, dyspnea or wheezes Cardiovascular: Denies palpitation, chest discomfort Gastrointestinal:  Denies heartburn or change in bowel habits  Skin: Denies abnormal skin rashes MSK: No new joint pain or myalgia Lymphatics: Denies new lymphadenopathy or easy bruising Neurological: (+) numbness and tingling in fingers and LLs Behavioral/Psych:  No new mood changes Extremity: (+) s/p surgical removal of lipoma of the upper left arm and shoulder All other systems were reviewed with the patient and are negative.  MEDICAL HISTORY:  Past Medical History:  Diagnosis Date  . Anxiety   . Asthma   . Cancer [facility])    left breast cancer  . Family history of cancer   . Genetic testing 02/01/2018   Multi-Cancer panel (83 genes) @ [facility] - No pathogenic mutations detected  . Hemorrhoids   . Lipoma of left shoulder   .  Migraine   . Migraine   . 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 ABDOMINAL HYSTERECTOMY    . APPENDECTOMY    . CESAREAN SECTION    . CHOLECYSTECTOMY    . ERCP    . IR FLUORO GUIDE PORT INSERTION RIGHT  10/05/2017  . IR REMOVAL TUN ACCESS W/ PORT W/O FL MOD SED  05/13/2018  . IR US GUIDE VASC ACCESS RIGHT  10/05/2017  . LIPOMA EXCISION Left 03/16/2018   left shoulder  . LIVER SURGERY    . PARTIAL MASTECTOMY WITH AXILLARY SENTINEL LYMPH NODE BIOPSY Left 07/31/2017   Procedure: LEFT PARTIAL  MASTECTOMY WITH AXILLARY SENTINEL LYMPH NODE BIOPSY;  Surgeon: Virl Cagey, MD;  Location: [facility];  Service: General;  Laterality: Left;    I have reviewed the social history and family history with the patient and they are unchanged from previous note.  ALLERGIES:  is allergic to onion; penicillins; sulfa antibiotics; yellow dye; tape; and clindamycin/lincomycin.  MEDICATIONS:  Current Outpatient Medications  Medication Sig Dispense Refill  . albuterol (PROVENTIL HFA;VENTOLIN HFA) 108 (90 Base) MCG/ACT inhaler Inhale 2 puffs into the lungs every 6 (six) hours as needed for wheezing or shortness of breath. 6.7 g 2  . ALPRAZolam (XANAX) 1 MG tablet TAKE 1 TABLET BY MOUTH TWICE DAILY AS NEEDED FOR ANXIETY 60 tablet 1  . gabapentin (NEURONTIN) 100 MG capsule Take 1 capsule (100 mg total) by mouth 3 (three) times daily. Take 1 capsule in am, 2 capsules in pm and 2 capsules at bedtime. 150 capsule 3  . SUMAtriptan (IMITREX) 100 MG tablet Take 1 tablet 168m  By mouth at onset of migraine, If symptoms persist 2 hours later then take additional dose.Maximum 2 per 24 hours 9 tablet 5  . zolpidem (AMBIEN) 10 MG tablet Take by mouth.    .Marland Kitchenalbuterol (PROVENTIL) (2.5 MG/3ML) 0.083% nebulizer solution Take 3 mLs (2.5 mg total) by nebulization every 6 (six) hours as needed for wheezing or shortness of breath. 90 mL 3  . HYDROcodone-acetaminophen (NORCO) 5-325 MG tablet Take 1 tablet by mouth every 6 (six) hours as needed for moderate pain. (Patient not taking: Reported on 03/23/2018) 30 tablet 0  . Hypromellose (ARTIFICIAL TEARS OP) Apply to eye 2 (two) times daily. Doesn't do drops in eyes does as a wash to flush eyes out    . ibuprofen (ADVIL,MOTRIN) 800 MG tablet Take 800 mg by mouth every 8 (eight) hours as needed.    . lidocaine (LIDODERM) 5 % Place 0.5-1 patches onto the skin daily as needed (pain). Remove & Discard patch within 12 hours or as directed by MD (Patient not taking: Reported on  05/24/2018) 30 patch 2  . lidocaine-prilocaine (EMLA) cream Apply 1 application topically as needed. (Patient not taking: Reported on 05/24/2018) 30 g 2  . Menthol, Topical Analgesic, (BIOFREEZE EX) Apply 1 application topically as needed (pain).    . mirtazapine (REMERON) 7.5 MG tablet Take 1 tablet (7.5 mg total) by mouth at bedtime. (Patient not taking: Reported on 03/23/2018) 30 tablet 0  . ondansetron (ZOFRAN) 8 MG tablet Take 1 tablet (8 mg total) by mouth 2 (two) times daily as needed. Start on the third day after chemotherapy. (Patient not taking: Reported on 05/24/2018) 30 tablet 3  . oxyCODONE (OXY IR/ROXICODONE) 5 MG immediate release tablet Take 1 tablet (5 mg total) by mouth every 6 (six)  hours as needed for severe pain. (Patient not taking: Reported on 05/24/2018) 10 tablet 0  . prochlorperazine (COMPAZINE) 10 MG tablet Take 1 tablet (10 mg total) by mouth every 6 (six) hours as needed for nausea or vomiting. (Patient not taking: Reported on 03/23/2018) 30 tablet 2  . zolpidem (AMBIEN) 10 MG tablet Take 1 tablet (10 mg total) by mouth at bedtime as needed for sleep. 30 tablet 3   No current facility-administered medications for this visit.     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  Pulse 76   Temp 97.8 F (36.6 C) (Oral)   Resp 20   Ht 5' 6.5" (1.689 m)   Wt 192 lb 8 oz (87.3 kg)   SpO2 100%   BMI 30.60 kg/m    GENERAL:alert, no distress and comfortable (+) hair growth SKIN: skin color, texture, turgor are normal, no rashes or significant lesions EYES: normal, Conjunctiva are pink and non-injected, sclera clear OROPHARYNX:no exudate, no erythema and lips, buccal mucosa, and tongue normal  NECK: supple, thyroid normal size, non-tender, without nodularity LYMPH:  no palpable lymphadenopathy in the cervical, axillary or inguinal LUNGS: clear to auscultation and percussion  with normal breathing effort HEART: regular rate & rhythm and no murmurs and no lower extremity edema ABDOMEN:abdomen soft, non-tender and normal bowel sounds. No hepatomegaly  Musculoskeletal:no cyanosis of digits and no clubbing  NEURO: alert & oriented x 3 with fluent speech, no focal motor/sensory deficits  EXTREMITY: (+) s/p removal lipoma of left upper arm, incision has healed very well. Breasts: Breast inspection showed them to be symmetrical with no nipple discharge.  Surgical scar in the upper outer quadrant of left breast and axilla have healed very well.  Palpation of the breasts and axilla revealed no obvious mass that I could appreciate.   LABORATORY DATA:  I have reviewed the data as listed CBC Latest Ref Rng & Units 05/24/2018 05/13/2018 02/23/2018  WBC 4.0 - 10.5 K/uL 6.4 5.6 5.4  Hemoglobin 12.0 - 15.0 g/dL 12.9 12.7 12.9  Hematocrit 36.0 - 46.0 % 40.0 39.3 39.1  Platelets 150 - 400 K/uL 306 299 313     CMP Latest Ref Rng & Units 05/24/2018 02/23/2018 01/25/2018  Glucose 70 - 99 mg/dL 88 81 99  BUN 8 - 23 mg/dL '10 12 12  ' Creatinine 0.44 - 1.00 mg/dL 0.78 0.72 0.77  Sodium 135 - 145 mmol/L 142 140 140  Potassium 3.5 - 5.1 mmol/L 3.7 4.0 3.6  Chloride 98 - 111 mmol/L 105 105 103  CO2 22 - 32 mmol/L '26 24 27  ' Calcium 8.9 - 10.3 mg/dL 9.7 9.3 9.3  Total Protein 6.5 - 8.1 g/dL 7.8 7.6 7.1  Total Bilirubin 0.3 - 1.2 mg/dL 0.3 <0.2(L) <0.2(L)  Alkaline Phos 38 - 126 U/L 123 111 100  AST 15 - 41 U/L '18 26 23  ' ALT 0 - 44 U/L '17 26 30   ' PROCEDURE  Echo 10/08/17 Study Conclusions - Left ventricle: The cavity size was normal. There was moderate   concentric hypertrophy. Systolic function was normal. The   estimated ejection fraction was in the range of 55% to 60%. Wall   motion was normal; there were no regional wall motion   abnormalities. Doppler parameters are consistent with abnormal   left ventricular relaxation (grade 1 diastolic dysfunction).   There was no evidence  of elevated ventricular filling pressure by  Doppler parameters. - Aortic valve: Trileaflet; normal thickness leaflets. There was no   regurgitation. - Aortic root: The aortic root was normal in size. - Mitral valve: There was no regurgitation. - Right ventricle: Systolic function was normal. - Right atrium: The atrium was normal in size. - Tricuspid valve: There was no regurgitation. - Pulmonic valve: There was no regurgitation. - Pulmonary arteries: Systolic pressure could not be accurately   estimated. - Inferior vena cava: The vessel was normal in size. - Pericardium, extracardiac: There was no pericardial effusion.  PATHOLOGY  Surgical Patholgoy 03/15/18 at [facility]: Soft tissue, left shoulder, excision - Lipoma with degenerative type changes (see note) - 12.6 cm maximum tumor dimension. - Negative for malignancy.  This electronic signature is attestation that the pathologist personally reviewed the submitted material(s) and the final diagnosis reflects that evaluation.   MDM2 FISH was performed at [facility] and is negative for amplification, lending no support for an atypical lipomatous tumor. Full report is available in the Epic Media tab.    01/25/2018 Molecular Pathology   Initial biopsy  Diagnosis 07/02/17 Breast, left, needle core biopsy, 2:00 - INVASIVE DUCTAL CARCINOMA. - SEE COMMENT. Microscopic Comment The carcinoma appears grade III. A breast prognostic profile will be performed and the results reported separately. The results were called to [facility] on 06/24/2017. (JBK:ecj 06/24/2017) Results: HER2 - NEGATIVE RATIO OF HER2/CEP17 SIGNALS 1.60 AVERAGE HER2 COPY NUMBER PER CELL 3.60 Results: IMMUNOHISTOCHEMICAL AND MORPHOMETRIC ANALYSIS PERFORMED MANUALLY Estrogen Receptor: 0%, NEGATIVE Progesterone Receptor: 0%, NEGATIVE Proliferation Marker Ki67: 90%  Surgical pathology  Diagnosis 07/31/18 1. Lymph node, sentinel, biopsy,  left - ONE OF ONE LYMPH NODES NEGATIVE FOR CARCINOMA (0/1). 2. 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 - INVASIVE CARCINOMA IS <0.1 CM OF THE ANTERIOR MARGIN BROADLY. - SEE ONCOLOGY TABLE. 3. Breast, excision, left deep - BENIGN BREAST TISSUE. 4. Breast, excision, left superior - BENIGN BREAST TISSUE. 5. Breast, excision, left medial - BENIGN BREAST TISSUE. 6. Breast, excision, left inferior - BENIGN BREAST TISSUE. 7. Breast, excision, left lateral - BENIGN BREAST TISSUE. 8. Lymph node, sentinel, biopsy - ONE OF ONE LYMPH NODES NEGATIVE FOR CARCINOMA (0/1). 9. Lymph node, sentinel, biopsy - ONE OF ONE LYMPH NODES NEGATIVE FOR CARCINOMA (0/1). 10. Lymph node, sentinel, biopsy - ONE OF ONE LYMPH NODES NEGATIVE FOR CARCINOMA (0/1). 11. Lymph node, sentinel, biopsy - ONE OF ONE LYMPH NODES NEGATIVE FOR CARCINOMA (0/1). Microscopic Comment 2. BREAST, INVASIVE TUMOR Procedure: Left partial mastectomy with additional margins and left sentinel lymph node biopsies. Laterality: Left. 1 of 4 FINAL for Mary Wise, Mary Wise ([id number]) Microscopic Comment(continued) Tumor Size: 4.5 cm. Histologic Type: Invasive ductal carcinoma. Grade: 3 Tubular Differentiation: 2 Nuclear Pleomorphism: 3 Mitotic Count: 3 Ductal Carcinoma in Situ (DCIS): Not identified. Extent of Tumor: Confined to breast parenchyma. Margins: Invasive carcinoma, distance from closest margin: <0.1 cm of anterior margin broadly. Remaining final margins (parts 3-7) are >1 cm. DCIS, distance from closest margin: N/A. Regional Lymph Nodes: Number of Lymph Nodes Examined: 5 Number of Sentinel Lymph Nodes Examined: 5 Lymph Nodes with Macrometastases: 0 Lymph Nodes with Micrometastases: 0 Lymph Nodes with Isolated Tumor Cells: 0 Breast Prognostic Profile: Performed on biopsy [id number], see below. Will not be repeated. Estrogen Receptor: Negative. Progesterone Receptor:  Negative. Her2: Negative (ratio 1.60). Ki-67: 90%. Best tumor block for sendout testing: 2D Pathologic Stage Classification (pTNM, AJCC 8th Edition): Primary Tumor (pT): pT2 Regional Lymph Nodes (  pN): pN0 Distant Metastases (pM): pMX Comments: None.  RADIOGRAPHIC STUDIES: I have personally reviewed the radiological images as listed and agreed with the findings in the report. No results found.    PET Scan 10/08/17 IMPRESSION: 1. No evidence breast cancer metastasis on FDG PET-CT scan. 2. Postsurgical change in the LEFT lateral breast and LEFT axilla.  Diagnostic Mammogram and [place] 06/16/17 IMPRESSION: Suspicious mass in the 230 o'clock location of the left breast. Tissue diagnosis is recommended. No evidence for adenopathy.  ASSESSMENT & PLAN:  Mary Wise 67 y.o. 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  1. 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  Proliferation marker Ki67 at 90% -I previously discussed her breast imaging and surgical pathology results with patient and her daughter in great detail. Her diagnostic mammogram from 06/16/17 revealed a suspicious mass in the 230 o'clock location of the left breast with her initial biopsy confirming invasive ductal carcinoma.  - Pt underwent a left partial mastectomy with sentinel node biopsy with Dr. Constance Haw on 07/31/17. Surgical pathology results revealed invasive ductal carcinoma, grade III, spanning 4.5 cm. 5 lymph nodes biopsied were negative.  -Given her strong family history of cancer, we recommend her to undergo genetic testing to ruled out inheritable breast cancer. She agreed  -She is at high risk for recurrence due to her Stage II triple negative disease which is more aggressive than ER PR positive disease. Her estimated risk for distant recurrence is about 30-35% -She has completed adjuvant  chemotherapy with AC-T, tolerated moderately well overall.  She is overall recovered well, still has mild to moderate peripheral neuropathy, on neurontin -She left upper extremity lipoma surgery by Dr. Percival Spanish at [facility] on 03/15/18  -She has appointment with Dr. Isidore Moos today to discuss adjuvant breast radiation. -We discussed breast cancer surveillance after she completes radiation. -I filled a handicap form for 6 months. I advised her to see her PCP to have it refilled later on.  -Labs reviewed, Cbc is WNLs and CMP pending.  -Mammogram in 07/2018. -Lab and f/u in 4 months   2. Anxiety -She takes Xanax PRN but has found herself using it more often since her Breast Cancer diagnosis. -I refilled xanax and ambien today. -I dicussed seeing a psychiatrist or counselor. She declined.   3. Large left upper arm lipoma  -She has a large lipoma in the upper arm/should area, she was seen by Dr. Sherlynn Stalls at [facility].  -She underwent Lipoma surgery on 03/15/18 with Dr. Sherlynn Stalls   4. Genetics -She has strong family history of breast, ovarian cancer and brain tumor, I strongly recommend her to have genetic testing to rule out inheritable genetic syndrome.  She agrees, I referred her -Genetic testing was negative  5. Insomnia  -She has chronic insomnia, tried trazodone, melatonin, and benadryl in the past. She was historically on Ambien x30 years but a previous provider took her off it. She gets approx 2 hours of sleep with xanax. Regan Rakers previously reviewed sleep hygiene with her and prescribed remeron on 10/26/17.  -She states that she experiences severe anxiety. She uses xanax and [place], I refilled today. I also advised her to f.u with a psychiatrist or counselor, she declined.   6.  Arthralgia and back pain -She states she has been taking hydrocodone occasionally (she gets from ED visits) for 10 years for arthritis. She uses it as needed.  --  pt requested refill of her norco on 11/09/2017. I advised her that I would  refill for her 1 more time and that she should only use this medication as needed and no more than 2 per day. She voiced good understanding. We previously discussed narcotic addiction, and I discouraged her to use it for long-term. -Has tramadol at home, I encouraged her to use as needed for pain also. Regan Rakers previously filled Lidocaine patch but was denied for coverage. -I recommend EMLA cream as needed she can use for her knee and back pain, she has refills available.  -I do not plan to refill her narcotics in the future   7. Peripheral neuropathy, G2 -She has developed worsening peripheral neuropathy lately, especially on her feet -I previously prescribed Neurontin 02/23/18, she will start 100 mg at night, and gradually increase to 300 mg.  If she tolerates it well, she can take 100 to 200 mg 1-2 times during the day also   Plan -I refilled albuterol and xanax and ambien today, she has an appointment with her new PCP in Jan 2020 -I filled her handicap form for 6 months, I do not plan to repeat again in future  -Mammogram at [facility] in 07/2018 -Lab and f/u in 4 months -she is going to start adjuvant breast radiation soon     No orders of the defined types were placed in this encounter.  All questions were answered. The patient knows to call the clinic with any problems, questions or concerns. No barriers to learning was detected.  I spent 25 minutes counseling the patient face to face. The total time spent in the appointment was 30 minutes and more than 50% was on counseling and review of test results  I, Noor Dweik am acting as scribe for Dr. Truitt Merle.  I have reviewed the above documentation for accuracy and completeness, and I agree with the above.     Truitt Merle, MD 05/24/18

## 2018-05-25 ENCOUNTER — Encounter: Payer: Self-pay | Admitting: Radiation Oncology

## 2018-05-25 DIAGNOSIS — C50412 Malignant neoplasm of upper-outer quadrant of left female breast: Secondary | ICD-10-CM | POA: Diagnosis not present

## 2018-05-25 DIAGNOSIS — Z510 Encounter for antineoplastic radiation therapy: Secondary | ICD-10-CM | POA: Diagnosis not present

## 2018-05-25 DIAGNOSIS — Z171 Estrogen receptor negative status [ER-]: Secondary | ICD-10-CM | POA: Diagnosis not present

## 2018-05-26 ENCOUNTER — Telehealth: Payer: Self-pay | Admitting: Hematology

## 2018-05-26 NOTE — Telephone Encounter (Signed)
Scheduled apt per 10/23  sch message - left message for patient with appt date and time.

## 2018-05-27 ENCOUNTER — Ambulatory Visit
Admission: RE | Admit: 2018-05-27 | Discharge: 2018-05-27 | Disposition: A | Discharge status: Home / Self Care | Payer: Medicare Other | Source: Ambulatory Visit | Attending: Radiation Oncology | Admitting: Radiation Oncology

## 2018-05-27 DIAGNOSIS — Z510 Encounter for antineoplastic radiation therapy: Secondary | ICD-10-CM | POA: Diagnosis not present

## 2018-05-27 DIAGNOSIS — Z171 Estrogen receptor negative status [ER-]: Secondary | ICD-10-CM | POA: Diagnosis not present

## 2018-05-27 DIAGNOSIS — C50412 Malignant neoplasm of upper-outer quadrant of left female breast: Secondary | ICD-10-CM | POA: Diagnosis not present

## 2018-05-28 ENCOUNTER — Ambulatory Visit
Admission: RE | Admit: 2018-05-28 | Discharge: 2018-05-28 | Disposition: A | Discharge status: Home / Self Care | Payer: Medicare Other | Source: Ambulatory Visit | Attending: Radiation Oncology | Admitting: Radiation Oncology

## 2018-05-28 DIAGNOSIS — Z171 Estrogen receptor negative status [ER-]: Principal | ICD-10-CM

## 2018-05-28 DIAGNOSIS — C50412 Malignant neoplasm of upper-outer quadrant of left female breast: Secondary | ICD-10-CM

## 2018-05-28 DIAGNOSIS — Z510 Encounter for antineoplastic radiation therapy: Secondary | ICD-10-CM | POA: Diagnosis not present

## 2018-05-28 MED ORDER — RADIAPLEXRX EX GEL
Freq: Once | CUTANEOUS | Status: AC
  Administered 2018-05-28: 10:00:00 via TOPICAL

## 2018-05-28 MED ORDER — ALRA NON-METALLIC DEODORANT (RAD-ONC)
1.0000 "application " | Freq: Once | TOPICAL | Status: AC
  Administered 2018-05-28: 1 via TOPICAL

## 2018-05-28 NOTE — Progress Notes (Signed)

## 2018-05-31 ENCOUNTER — Ambulatory Visit
Admission: RE | Admit: 2018-05-31 | Discharge: 2018-05-31 | Disposition: A | Discharge status: Home / Self Care | Payer: Medicare Other | Source: Ambulatory Visit | Attending: Radiation Oncology | Admitting: Radiation Oncology

## 2018-05-31 ENCOUNTER — Ambulatory Visit: Payer: Medicare Other | Admitting: Radiation Oncology

## 2018-05-31 DIAGNOSIS — Z510 Encounter for antineoplastic radiation therapy: Secondary | ICD-10-CM | POA: Diagnosis not present

## 2018-05-31 DIAGNOSIS — C50412 Malignant neoplasm of upper-outer quadrant of left female breast: Secondary | ICD-10-CM | POA: Diagnosis not present

## 2018-05-31 DIAGNOSIS — Z171 Estrogen receptor negative status [ER-]: Secondary | ICD-10-CM | POA: Diagnosis not present

## 2018-06-01 ENCOUNTER — Ambulatory Visit
Admission: RE | Admit: 2018-06-01 | Discharge: 2018-06-01 | Disposition: A | Discharge status: Home / Self Care | Payer: Medicare Other | Source: Ambulatory Visit | Attending: Radiation Oncology | Admitting: Radiation Oncology

## 2018-06-01 DIAGNOSIS — Z171 Estrogen receptor negative status [ER-]: Secondary | ICD-10-CM | POA: Diagnosis not present

## 2018-06-01 DIAGNOSIS — Z510 Encounter for antineoplastic radiation therapy: Secondary | ICD-10-CM | POA: Diagnosis not present

## 2018-06-01 DIAGNOSIS — C50412 Malignant neoplasm of upper-outer quadrant of left female breast: Secondary | ICD-10-CM | POA: Diagnosis not present

## 2018-06-02 ENCOUNTER — Ambulatory Visit
Admission: RE | Admit: 2018-06-02 | Discharge: 2018-06-02 | Disposition: A | Discharge status: Home / Self Care | Payer: Medicare Other | Source: Ambulatory Visit | Attending: Radiation Oncology | Admitting: Radiation Oncology

## 2018-06-02 DIAGNOSIS — C50412 Malignant neoplasm of upper-outer quadrant of left female breast: Secondary | ICD-10-CM | POA: Diagnosis not present

## 2018-06-02 DIAGNOSIS — Z171 Estrogen receptor negative status [ER-]: Secondary | ICD-10-CM | POA: Diagnosis not present

## 2018-06-02 DIAGNOSIS — Z510 Encounter for antineoplastic radiation therapy: Secondary | ICD-10-CM | POA: Diagnosis not present

## 2018-06-03 ENCOUNTER — Ambulatory Visit
Admission: RE | Admit: 2018-06-03 | Discharge: 2018-06-03 | Disposition: A | Discharge status: Home / Self Care | Payer: Medicare Other | Source: Ambulatory Visit | Attending: Radiation Oncology | Admitting: Radiation Oncology

## 2018-06-03 DIAGNOSIS — Z510 Encounter for antineoplastic radiation therapy: Secondary | ICD-10-CM | POA: Diagnosis not present

## 2018-06-03 DIAGNOSIS — C50412 Malignant neoplasm of upper-outer quadrant of left female breast: Secondary | ICD-10-CM | POA: Diagnosis not present

## 2018-06-03 DIAGNOSIS — Z171 Estrogen receptor negative status [ER-]: Secondary | ICD-10-CM | POA: Diagnosis not present

## 2018-06-04 ENCOUNTER — Ambulatory Visit
Admission: RE | Admit: 2018-06-04 | Discharge: 2018-06-04 | Disposition: A | Discharge status: Home / Self Care | Payer: Medicare Other | Source: Ambulatory Visit | Attending: Radiation Oncology | Admitting: Radiation Oncology

## 2018-06-04 DIAGNOSIS — Z171 Estrogen receptor negative status [ER-]: Secondary | ICD-10-CM | POA: Insufficient documentation

## 2018-06-04 DIAGNOSIS — C50412 Malignant neoplasm of upper-outer quadrant of left female breast: Secondary | ICD-10-CM | POA: Diagnosis not present

## 2018-06-07 ENCOUNTER — Ambulatory Visit
Admission: RE | Admit: 2018-06-07 | Discharge: 2018-06-07 | Disposition: A | Discharge status: Home / Self Care | Payer: Medicare Other | Source: Ambulatory Visit | Attending: Radiation Oncology | Admitting: Radiation Oncology

## 2018-06-07 DIAGNOSIS — C50412 Malignant neoplasm of upper-outer quadrant of left female breast: Secondary | ICD-10-CM | POA: Diagnosis not present

## 2018-06-07 DIAGNOSIS — Z171 Estrogen receptor negative status [ER-]: Principal | ICD-10-CM

## 2018-06-07 MED ORDER — SONAFINE EX EMUL
1.0000 "application " | Freq: Two times a day (BID) | CUTANEOUS | Status: DC
  Administered 2018-06-07: 1 via TOPICAL

## 2018-06-08 ENCOUNTER — Ambulatory Visit
Admission: RE | Admit: 2018-06-08 | Discharge: 2018-06-08 | Disposition: A | Discharge status: Home / Self Care | Payer: Medicare Other | Source: Ambulatory Visit | Attending: Radiation Oncology | Admitting: Radiation Oncology

## 2018-06-08 DIAGNOSIS — Z171 Estrogen receptor negative status [ER-]: Secondary | ICD-10-CM | POA: Diagnosis not present

## 2018-06-08 DIAGNOSIS — C50412 Malignant neoplasm of upper-outer quadrant of left female breast: Secondary | ICD-10-CM | POA: Diagnosis not present

## 2018-06-09 ENCOUNTER — Ambulatory Visit
Admission: RE | Admit: 2018-06-09 | Discharge: 2018-06-09 | Disposition: A | Discharge status: Home / Self Care | Payer: Medicare Other | Source: Ambulatory Visit | Attending: Radiation Oncology | Admitting: Radiation Oncology

## 2018-06-09 DIAGNOSIS — C50412 Malignant neoplasm of upper-outer quadrant of left female breast: Secondary | ICD-10-CM | POA: Diagnosis not present

## 2018-06-09 DIAGNOSIS — Z171 Estrogen receptor negative status [ER-]: Secondary | ICD-10-CM | POA: Diagnosis not present

## 2018-06-10 ENCOUNTER — Ambulatory Visit
Admission: RE | Admit: 2018-06-10 | Discharge: 2018-06-10 | Disposition: A | Discharge status: Home / Self Care | Payer: Medicare Other | Source: Ambulatory Visit | Attending: Radiation Oncology | Admitting: Radiation Oncology

## 2018-06-10 DIAGNOSIS — C50412 Malignant neoplasm of upper-outer quadrant of left female breast: Secondary | ICD-10-CM | POA: Diagnosis not present

## 2018-06-10 DIAGNOSIS — Z171 Estrogen receptor negative status [ER-]: Secondary | ICD-10-CM | POA: Diagnosis not present

## 2018-06-11 ENCOUNTER — Ambulatory Visit
Admission: RE | Admit: 2018-06-11 | Discharge: 2018-06-11 | Disposition: A | Discharge status: Home / Self Care | Payer: Medicare Other | Source: Ambulatory Visit | Attending: Radiation Oncology | Admitting: Radiation Oncology

## 2018-06-11 DIAGNOSIS — Z171 Estrogen receptor negative status [ER-]: Secondary | ICD-10-CM | POA: Diagnosis not present

## 2018-06-11 DIAGNOSIS — C50412 Malignant neoplasm of upper-outer quadrant of left female breast: Secondary | ICD-10-CM | POA: Diagnosis not present

## 2018-06-14 ENCOUNTER — Ambulatory Visit
Admission: RE | Admit: 2018-06-14 | Discharge: 2018-06-14 | Disposition: A | Discharge status: Home / Self Care | Payer: Medicare Other | Source: Ambulatory Visit | Attending: Radiation Oncology | Admitting: Radiation Oncology

## 2018-06-14 ENCOUNTER — Ambulatory Visit: Payer: Medicare Other | Admitting: Radiation Oncology

## 2018-06-14 DIAGNOSIS — C50412 Malignant neoplasm of upper-outer quadrant of left female breast: Secondary | ICD-10-CM | POA: Diagnosis not present

## 2018-06-14 DIAGNOSIS — Z171 Estrogen receptor negative status [ER-]: Secondary | ICD-10-CM | POA: Diagnosis not present

## 2018-06-15 ENCOUNTER — Ambulatory Visit
Admission: RE | Admit: 2018-06-15 | Discharge: 2018-06-15 | Disposition: A | Discharge status: Home / Self Care | Payer: Medicare Other | Source: Ambulatory Visit | Attending: Radiation Oncology | Admitting: Radiation Oncology

## 2018-06-15 DIAGNOSIS — Z171 Estrogen receptor negative status [ER-]: Secondary | ICD-10-CM | POA: Diagnosis not present

## 2018-06-15 DIAGNOSIS — C50412 Malignant neoplasm of upper-outer quadrant of left female breast: Secondary | ICD-10-CM | POA: Diagnosis not present

## 2018-06-16 ENCOUNTER — Ambulatory Visit
Admission: RE | Admit: 2018-06-16 | Discharge: 2018-06-16 | Disposition: A | Discharge status: Home / Self Care | Payer: Medicare Other | Source: Ambulatory Visit | Attending: Radiation Oncology | Admitting: Radiation Oncology

## 2018-06-16 DIAGNOSIS — C50412 Malignant neoplasm of upper-outer quadrant of left female breast: Secondary | ICD-10-CM | POA: Diagnosis not present

## 2018-06-16 DIAGNOSIS — Z171 Estrogen receptor negative status [ER-]: Secondary | ICD-10-CM | POA: Diagnosis not present

## 2018-06-17 ENCOUNTER — Inpatient Hospital Stay: Payer: Medicare Other | Admitting: Hematology

## 2018-06-17 ENCOUNTER — Ambulatory Visit
Admission: RE | Admit: 2018-06-17 | Discharge: 2018-06-17 | Disposition: A | Discharge status: Home / Self Care | Payer: Medicare Other | Source: Ambulatory Visit | Attending: Radiation Oncology | Admitting: Radiation Oncology

## 2018-06-17 DIAGNOSIS — Z171 Estrogen receptor negative status [ER-]: Secondary | ICD-10-CM | POA: Diagnosis not present

## 2018-06-17 DIAGNOSIS — C50412 Malignant neoplasm of upper-outer quadrant of left female breast: Secondary | ICD-10-CM | POA: Diagnosis not present

## 2018-06-18 ENCOUNTER — Ambulatory Visit (INDEPENDENT_AMBULATORY_CARE_PROVIDER_SITE_OTHER): Payer: Medicare Other | Admitting: Family Medicine

## 2018-06-18 ENCOUNTER — Encounter: Payer: Self-pay | Admitting: Family Medicine

## 2018-06-18 ENCOUNTER — Other Ambulatory Visit: Payer: Self-pay

## 2018-06-18 ENCOUNTER — Ambulatory Visit
Admission: RE | Admit: 2018-06-18 | Discharge: 2018-06-18 | Disposition: A | Discharge status: Home / Self Care | Payer: Medicare Other | Source: Ambulatory Visit | Attending: Radiation Oncology | Admitting: Radiation Oncology

## 2018-06-18 ENCOUNTER — Ambulatory Visit: Payer: Medicare Other | Admitting: Hematology

## 2018-06-18 VITALS — BP 138/82 | HR 114 | Temp 98.9°F | Resp 24 | Ht 66.5 in | Wt 196.0 lb

## 2018-06-18 DIAGNOSIS — C50412 Malignant neoplasm of upper-outer quadrant of left female breast: Secondary | ICD-10-CM | POA: Diagnosis not present

## 2018-06-18 DIAGNOSIS — Z171 Estrogen receptor negative status [ER-]: Secondary | ICD-10-CM | POA: Diagnosis not present

## 2018-06-18 DIAGNOSIS — J4521 Mild intermittent asthma with (acute) exacerbation: Secondary | ICD-10-CM

## 2018-06-18 MED ORDER — PREDNISONE 20 MG PO TABS: ORAL_TABLET | ORAL | 0 refills | Status: DC

## 2018-06-18 MED ORDER — METHYLPREDNISOLONE ACETATE 80 MG/ML IJ SUSP
80.0000 mg | Freq: Once | INTRAMUSCULAR | Status: AC
  Administered 2018-06-18: 80 mg via INTRAMUSCULAR

## 2018-06-18 NOTE — Progress Notes (Signed)
Acute Office Visit/Walk in for triage  Subjective:    Patient ID: Mary Wise, female    DOB: 22-Oct-1950, 67 y.o.   MRN: [id number]  Chief Complaint  Patient presents with  . Illness    x2 days- semi prodcutive cough with green mucus- no fever, rib/ chedst pain with cough   Patient walked in without an appointment complaining of shortness of breath, chest pain, rib pain, coughing, states that she cannot eat and she cannot sleep and she cannot breathe.  Was triaged by [facility] LPN -vital signs significant for tachycardia.  She has history of active breast cancer - states she has appt later today for her radiation therapy and she just wants a "shot".  She also states that she gets severe bronchitis just like this at least 2 times a year.  Her main complaint is that the coughing is so irritating and triggered so easily that she is having hard time eating, drinking, sleeping and she does feel wheezy and short of breath. Symptoms started 2 days ago and gradually worsened, cough productive with green mucus, she denies fever, night sweats.   Outpatient Medications Prior to Visit  Medication Sig Dispense Refill  . albuterol (PROVENTIL HFA;VENTOLIN HFA) 108 (90 Base) MCG/ACT inhaler Inhale 2 puffs into the lungs every 6 (six) hours as needed for wheezing or shortness of breath. 6.7 g 2  . albuterol (PROVENTIL) (2.5 MG/3ML) 0.083% nebulizer solution Take 3 mLs (2.5 mg total) by nebulization every 6 (six) hours as needed for wheezing or shortness of breath. 90 mL 3  . ALPRAZolam (XANAX) 1 MG tablet TAKE 1 TABLET BY MOUTH TWICE DAILY AS NEEDED FOR ANXIETY 60 tablet 1  . gabapentin (NEURONTIN) 100 MG capsule Take 1 capsule (100 mg total) by mouth 3 (three) times daily. Take 1 capsule in am, 2 capsules in pm and 2 capsules at bedtime. 150 capsule 3  . zolpidem (AMBIEN) 10 MG tablet Take by mouth.    Marland Kitchen HYDROcodone-acetaminophen (NORCO) 5-325 MG tablet Take 1 tablet by mouth every 6 (six) hours as  needed for moderate pain. (Patient not taking: Reported on 06/18/2018) 30 tablet 0  . Hypromellose (ARTIFICIAL TEARS OP) Apply to eye 2 (two) times daily. Doesn't do drops in eyes does as a wash to flush eyes out    . ibuprofen (ADVIL,MOTRIN) 800 MG tablet Take 800 mg by mouth every 8 (eight) hours as needed.    . lidocaine (LIDODERM) 5 % Place 0.5-1 patches onto the skin daily as needed (pain). Remove & Discard patch within 12 hours or as directed by MD (Patient not taking: Reported on 06/18/2018) 30 patch 2  . lidocaine-prilocaine (EMLA) cream Apply 1 application topically as needed. (Patient not taking: Reported on 06/18/2018) 30 g 2  . Menthol, Topical Analgesic, (BIOFREEZE EX) Apply 1 application topically as needed (pain).    . mirtazapine (REMERON) 7.5 MG tablet Take 1 tablet (7.5 mg total) by mouth at bedtime. (Patient not taking: Reported on 06/18/2018) 30 tablet 0  . ondansetron (ZOFRAN) 8 MG tablet Take 1 tablet (8 mg total) by mouth 2 (two) times daily as needed. Start on the third day after chemotherapy. (Patient not taking: Reported on 06/18/2018) 30 tablet 3  . oxyCODONE (OXY IR/ROXICODONE) 5 MG immediate release tablet Take 1 tablet (5 mg total) by mouth every 6 (six) hours as needed for severe pain. (Patient not taking: Reported on 06/18/2018) 10 tablet 0  . prochlorperazine (COMPAZINE) 10 MG tablet Take 1 tablet (  10 mg total) by mouth every 6 (six) hours as needed for nausea or vomiting. (Patient not taking: Reported on 06/18/2018) 30 tablet 2  . SUMAtriptan (IMITREX) 100 MG tablet Take 1 tablet 100mg   By mouth at onset of migraine, If symptoms persist 2 hours later then take additional dose.Maximum 2 per 24 hours (Patient not taking: Reported on 06/18/2018) 9 tablet 5  . zolpidem (AMBIEN) 10 MG tablet Take 1 tablet (10 mg total) by mouth at bedtime as needed for sleep. (Patient not taking: Reported on 06/18/2018) 30 tablet 3   No facility-administered medications prior to visit.      Allergies  Allergen Reactions  . Onion Other (See Comments)    REACTION: results in coma state  . Penicillins Hives, Shortness Of Breath and Swelling    Throat swelling Has patient had a PCN reaction causing immediate rash, facial/tongue/throat swelling, SOB or lightheadedness with hypotension: Yes Has patient had a PCN reaction causing severe rash involving mucus membranes or skin necrosis: No Has patient had a PCN reaction that required hospitalization: Yes Has patient had a PCN reaction occurring within the last 10 years: No If all of the above answers are "NO", then may proceed with Cephalosporin use.   . Sulfa Antibiotics Hives and Shortness Of Breath  . Yellow Dye     REACTION: #5 and #90 "will kill me"  . Tape Swelling    Pt states she had tape and Tegaderm used on her in [place]. This caused swelling to her face and neck. Wounds and bleeding from area tape/tegaderm was used.   . Clindamycin/Lincomycin Hives and Nausea And Vomiting    Review of Systems  Constitutional: Negative.   HENT: Negative.   Eyes: Negative.   Respiratory: Negative.   Cardiovascular: Negative.   Gastrointestinal: Negative.   Endocrine: Negative.   Genitourinary: Negative.   Musculoskeletal: Negative.   Skin: Negative.   Allergic/Immunologic: Negative.   Neurological: Negative.   Hematological: Negative.   Psychiatric/Behavioral: Negative.   All other systems reviewed and are negative.        Objective:      BP 138/82   Pulse (!) 114   Temp 98.9 F (37.2 C) (Oral)   Resp (!) 24   Ht 5' 6.5" (1.689 m)   Wt 196 lb (88.9 kg)   SpO2 97%   BMI 31.16 kg/m  Wt Readings from Last 3 Encounters:  06/18/18 196 lb (88.9 kg)  05/24/18 192 lb 8 oz (87.3 kg)  03/23/18 191 lb 3.2 oz (86.7 kg)     Physical Exam  Constitutional: She appears well-developed and well-nourished. No distress.  HENT:  Head: Normocephalic and atraumatic.  Nose: Mucosal edema and rhinorrhea present. No  sinus tenderness. Right sinus exhibits no maxillary sinus tenderness and no frontal sinus tenderness. Left sinus exhibits no maxillary sinus tenderness.  Mouth/Throat: Uvula is midline, oropharynx is clear and moist and mucous membranes are normal. Mucous membranes are not pale, not dry and not cyanotic. No oropharyngeal exudate, posterior oropharyngeal edema, posterior oropharyngeal erythema or tonsillar abscesses.  Eyes: Pupils are equal, round, and reactive to light. Conjunctivae are normal. Right eye exhibits no discharge. Left eye exhibits no discharge.  Neck: Normal range of motion. No tracheal deviation present.  Cardiovascular: Normal rate, regular rhythm, normal heart sounds and intact distal pulses. Exam reveals no gallop and no friction rub.  No murmur heard. Pulmonary/Chest: Effort normal. No accessory muscle usage or stridor. No respiratory distress. She has no decreased breath sounds.  She has wheezes (wheeze only auscultated with coughing). She has no rhonchi. She has no rales. She exhibits no tenderness.  Frequent coughing fits triggered by deep breaths  Abdominal: Soft. Bowel sounds are normal. She exhibits no distension and no mass. There is no tenderness. There is no guarding.  Musculoskeletal: Normal range of motion.  Neurological: She is alert. She exhibits normal muscle tone. Coordination normal.  Skin: Skin is warm and dry. No rash noted. She is not diaphoretic.  Psychiatric: She has a normal mood and affect. Her behavior is normal.  Nursing note and vitals reviewed.        Assessment & Plan:   Pt is a 66 y/o female presents to clinic in lobby for 2 days of cough, wheeze, SOB, she endorses hx of asthma, says this is consistant with asthma exacerbation, usually happens 2x a year.  Started with some nasal sx.    She also has hx of CA, currently getting radiation tx.  Visit Diagnoses    Bronchitis, asthmatic, mild intermittent, with acute exacerbation    -  Primary    Relevant Medications   methylPREDNISolone acetate (DEPO-MEDROL) injection 80 mg (Start on 06/18/2018  9:30 AM)   predniSONE (DELTASONE) 20 MG tablet (Start on 06/19/2018)     Pts exam was suggestive of asthma exacerbation, nasal mucosa was pale, enlarged - consistent with allergic rhinitis, suspect asthma exacerbation/bronchitis was triggered by allergies and weather changes, she states the hot and cold temperatures also cause her to have asthma attacks  Did review prednisone with her she is able to take this and does not have a dye allergy contraindication per the patient   She was instructed to call radiation oncology and inquire whether or not she is able to come with her symptoms today  She was also given strict follow-up and ER precautions that if she does not improve the way she normally does with treatment of asthma exacerbation that she needs to go to the ER or go to urgent care for evaluation of other possible causes of her symptoms which could be worsening infection due to her immunocompromise state, blood clots, did discuss the risk of delay of evaluation which could lead to morbidity mortality.  Patient was mildly tachycardic when she arrived but had just done a nebulizer treatment 40 minutes prior to arrival  Meds ordered this encounter  Medications  . methylPREDNISolone acetate (DEPO-MEDROL) injection 80 mg  . predniSONE (DELTASONE) 20 MG tablet    Sig: 2 tabs poqday 1-3, 1 tabs poqday 4-6    Dispense:  9 tablet    Refill:  0    Order Specific Question:   Supervising Provider    Answer:   Vic Blackbird F [[id number]]    Pt was in agreement with plan, she left in good condition, no respiratory distress  Delsa Grana, PA-C

## 2018-06-18 NOTE — Patient Instructions (Addendum)
Call Rad-Onc and see if they will allow you to come with your symptoms.  If you continue to have coughing, shortness of breath and any pain that does not improve like it normally does with steroids (if it doesn't get better like your asthma usually does) you need to go to the ER or an Urgent care to get evaluated.  Your high risk for infections and blood clots all of these things can cause similar symptoms, and ignoring the symptoms or not getting rechecked could lead to serious conditions, morbidity or mortality

## 2018-06-21 ENCOUNTER — Ambulatory Visit
Admission: RE | Admit: 2018-06-21 | Discharge: 2018-06-21 | Disposition: A | Discharge status: Home / Self Care | Payer: Medicare Other | Source: Ambulatory Visit | Attending: Radiation Oncology | Admitting: Radiation Oncology

## 2018-06-21 DIAGNOSIS — Z171 Estrogen receptor negative status [ER-]: Secondary | ICD-10-CM | POA: Diagnosis not present

## 2018-06-21 DIAGNOSIS — C50412 Malignant neoplasm of upper-outer quadrant of left female breast: Secondary | ICD-10-CM

## 2018-06-21 MED ORDER — RADIAPLEXRX EX GEL
Freq: Once | CUTANEOUS | Status: AC
  Administered 2018-06-21: 10:00:00 via TOPICAL

## 2018-06-22 ENCOUNTER — Ambulatory Visit
Admission: RE | Admit: 2018-06-22 | Discharge: 2018-06-22 | Disposition: A | Discharge status: Home / Self Care | Payer: Medicare Other | Source: Ambulatory Visit | Attending: Radiation Oncology | Admitting: Radiation Oncology

## 2018-06-22 ENCOUNTER — Encounter: Payer: Self-pay | Admitting: *Deleted

## 2018-06-22 ENCOUNTER — Telehealth: Payer: Self-pay | Admitting: Adult Health

## 2018-06-22 DIAGNOSIS — C50412 Malignant neoplasm of upper-outer quadrant of left female breast: Secondary | ICD-10-CM | POA: Diagnosis not present

## 2018-06-22 DIAGNOSIS — Z171 Estrogen receptor negative status [ER-]: Secondary | ICD-10-CM | POA: Diagnosis not present

## 2018-06-22 NOTE — Telephone Encounter (Signed)
Scheduled appt per 11/19 sch message - sent reminder letter in the mail with appt date and time.

## 2018-06-23 ENCOUNTER — Encounter: Payer: Self-pay | Admitting: Radiation Oncology

## 2018-06-23 ENCOUNTER — Encounter: Payer: Self-pay | Admitting: Physician Assistant

## 2018-06-23 ENCOUNTER — Ambulatory Visit
Admission: RE | Admit: 2018-06-23 | Discharge: 2018-06-23 | Disposition: A | Discharge status: Home / Self Care | Payer: Medicare Other | Source: Ambulatory Visit | Attending: Radiation Oncology | Admitting: Radiation Oncology

## 2018-06-23 DIAGNOSIS — C50412 Malignant neoplasm of upper-outer quadrant of left female breast: Secondary | ICD-10-CM | POA: Diagnosis not present

## 2018-06-23 DIAGNOSIS — Z171 Estrogen receptor negative status [ER-]: Secondary | ICD-10-CM | POA: Diagnosis not present

## 2018-06-29 NOTE — Progress Notes (Signed)
  Radiation Oncology         [phone] ________________________________  Name: Mary Wise MRN: [id number]  Date: 06/23/2018  DOB: 12/31/1950  End of Treatment Note  Diagnosis:   67 y.o. female with Malignant neoplasm of upper-outer quadrant of left breast in female, estrogen receptor negative ([place]) Staging form: Breast, AJCC 8th Edition - Pathologic stage from 08/19/2017: Stage IIA (pT2, pN0(sn), cM0, G3, ER: Negative, PR: Negative, HER2: Negative) - Signed by Ardath Sax, MD on 09/01/2017     Indication for treatment:  Curative       Radiation treatment dates:   05/27/2018 - 06/23/2018  Site/dose:    1. Left Breast / 40.05 Gy in 15 fractions 2. Left Breast Boost / 10 Gy in 5 fractions - Total dose of 50.05 Gy  Beams/energy:    1. 3D / 6X Photon 2. Isodose / 10X, 6X Photon  Narrative: The patient tolerated radiation treatment relatively well.  She experienced fatigue and some expected skin irritation with mild erythema and soreness to her breast/axilla. She is applying Radiaplex twice daily as directed. She also mentioned bilateral hand cramping with bruising. She was advised to discuss this with her PCP as it is not related to her radiation treatment.  Plan: The patient has completed radiation treatment. The patient will return to radiation oncology clinic for routine followup in one month. I advised them to call or return sooner if they have any questions or concerns related to their recovery or treatment.  -----------------------------------  Eppie Gibson, MD  This document serves as a record of services personally performed by Eppie Gibson, MD. It was created on her behalf by Rae Lips, a trained medical scribe. The creation of this record is based on the scribe's personal observations and the provider's statements to them. This document has been checked and approved by the attending provider.

## 2018-07-19 ENCOUNTER — Encounter: Payer: Self-pay | Admitting: Radiation Oncology

## 2018-07-19 NOTE — Progress Notes (Signed)
Ms. Lastinger presents for follow up of radiation completed 06/23/18 to her left breast. She saw Dr. Burr Medico last on 05/24/18 and has an appointment with her again on 09/20/18. She will see survivorship on 12/21/18. Her skin has healed well from radiation. She is currently using gold bond aloe to this area and is very pleased with how her skin has healed.   BP (!) 141/80 (BP Location: Right Arm, Patient Position: Sitting)   Pulse 75   Temp 97.7 F (36.5 C) (Oral)   Resp 18   Ht 5\' 8"  (1.727 m)   Wt 192 lb 6 oz (87.3 kg)   SpO2 98%   BMI 29.25 kg/m    Wt Readings from Last 3 Encounters:  07/23/18 192 lb 6 oz (87.3 kg)  06/18/18 196 lb (88.9 kg)  05/24/18 192 lb 8 oz (87.3 kg)

## 2018-07-23 ENCOUNTER — Ambulatory Visit
Admission: RE | Admit: 2018-07-23 | Discharge: 2018-07-23 | Disposition: A | Discharge status: Home / Self Care | Payer: Medicare Other | Source: Ambulatory Visit | Attending: Radiation Oncology | Admitting: Radiation Oncology

## 2018-07-23 ENCOUNTER — Encounter: Payer: Self-pay | Admitting: Radiation Oncology

## 2018-07-23 ENCOUNTER — Other Ambulatory Visit: Payer: Self-pay

## 2018-07-23 DIAGNOSIS — Z79899 Other long term (current) drug therapy: Secondary | ICD-10-CM | POA: Insufficient documentation

## 2018-07-23 DIAGNOSIS — Z171 Estrogen receptor negative status [ER-]: Secondary | ICD-10-CM

## 2018-07-23 DIAGNOSIS — C50412 Malignant neoplasm of upper-outer quadrant of left female breast: Secondary | ICD-10-CM

## 2018-07-23 DIAGNOSIS — Z923 Personal history of irradiation: Secondary | ICD-10-CM | POA: Insufficient documentation

## 2018-07-23 HISTORY — DX: Personal history of irradiation: Z92.3

## 2018-07-23 NOTE — Progress Notes (Signed)
Radiation Oncology         ([phone]) [phone] ________________________________  Name: Mary Wise MRN: [id number]  Date: 07/23/2018  DOB: 1951-02-14  Follow-Up Visit Note  Outpatient  CC: Alycia Rossetti, MD  Truitt Merle, MD  Diagnosis and Prior Radiotherapy:    ICD-10-CM   1. Malignant neoplasm of upper-outer quadrant of left breast in female, estrogen receptor negative ([place]) C50.412    Z17.1     CHIEF COMPLAINT: Here for follow-up and surveillance of left breast cancer  Radiation treatment dates:   05/27/2018 - 06/23/2018  Site/dose:    1. Left Breast / 40.05 Gy in 15 fractions 2. Left Breast Boost / 10 Gy in 5 fractions - Total dose of 50.05 Gy  Narrative:  The patient returns today for routine follow-up. She has been doing well overall. She has been using Gold-bond Aloe w/ vitamins to her radiation treatment sites.          She last saw Dr. Burr Medico on 05/24/2018 and has a follow up appointment with Dr. Burr Medico on 09/20/2018.                          ALLERGIES:  is allergic to onion; penicillins; sulfa antibiotics; yellow dye; tape; and clindamycin/lincomycin.  Meds: Current Outpatient Medications  Medication Sig Dispense Refill  . albuterol (PROVENTIL HFA;VENTOLIN HFA) 108 (90 Base) MCG/ACT inhaler Inhale 2 puffs into the lungs every 6 (six) hours as needed for wheezing or shortness of breath. 6.7 g 2  . albuterol (PROVENTIL) (2.5 MG/3ML) 0.083% nebulizer solution Take 3 mLs (2.5 mg total) by nebulization every 6 (six) hours as needed for wheezing or shortness of breath. 90 mL 3  . ALPRAZolam (XANAX) 1 MG tablet TAKE 1 TABLET BY MOUTH TWICE DAILY AS NEEDED FOR ANXIETY 60 tablet 1  . gabapentin (NEURONTIN) 100 MG capsule Take 1 capsule (100 mg total) by mouth 3 (three) times daily. Take 1 capsule in am, 2 capsules in pm and 2 capsules at bedtime. 150 capsule 3  . Hypromellose (ARTIFICIAL TEARS OP) Apply to eye 2 (two) times daily. Doesn't do drops in eyes does as a wash to flush  eyes out    . lidocaine (LIDODERM) 5 % Place 0.5-1 patches onto the skin daily as needed (pain). Remove & Discard patch within 12 hours or as directed by MD 30 patch 2  . Menthol, Topical Analgesic, (BIOFREEZE EX) Apply 1 application topically as needed (pain).    . SUMAtriptan (IMITREX) 100 MG tablet Take 1 tablet 100mg   By mouth at onset of migraine, If symptoms persist 2 hours later then take additional dose.Maximum 2 per 24 hours 9 tablet 5  . zolpidem (AMBIEN) 10 MG tablet Take by mouth.    Marland Kitchen HYDROcodone-acetaminophen (NORCO) 5-325 MG tablet Take 1 tablet by mouth every 6 (six) hours as needed for moderate pain. (Patient not taking: Reported on 06/18/2018) 30 tablet 0  . ibuprofen (ADVIL,MOTRIN) 800 MG tablet Take 800 mg by mouth every 8 (eight) hours as needed.    . lidocaine-prilocaine (EMLA) cream Apply 1 application topically as needed. (Patient not taking: Reported on 06/18/2018) 30 g 2  . mirtazapine (REMERON) 7.5 MG tablet Take 1 tablet (7.5 mg total) by mouth at bedtime. (Patient not taking: Reported on 06/18/2018) 30 tablet 0  . ondansetron (ZOFRAN) 8 MG tablet Take 1 tablet (8 mg total) by mouth 2 (two) times daily as needed. Start on the third day after chemotherapy. (  Patient not taking: Reported on 06/18/2018) 30 tablet 3  . oxyCODONE (OXY IR/ROXICODONE) 5 MG immediate release tablet Take 1 tablet (5 mg total) by mouth every 6 (six) hours as needed for severe pain. (Patient not taking: Reported on 06/18/2018) 10 tablet 0  . predniSONE (DELTASONE) 20 MG tablet 2 tabs poqday 1-3, 1 tabs poqday 4-6 (Patient not taking: Reported on 07/23/2018) 9 tablet 0  . prochlorperazine (COMPAZINE) 10 MG tablet Take 1 tablet (10 mg total) by mouth every 6 (six) hours as needed for nausea or vomiting. (Patient not taking: Reported on 06/18/2018) 30 tablet 2  . zolpidem (AMBIEN) 10 MG tablet Take 1 tablet (10 mg total) by mouth at bedtime as needed for sleep. (Patient not taking: Reported on 06/18/2018)  30 tablet 3   No current facility-administered medications for this encounter.     Physical Findings: The patient is in no acute distress. Patient is alert and oriented.  height is 5\' 8"  (1.727 m) and weight is 192 lb 6 oz (87.3 kg). Her oral temperature is 97.7 F (36.5 C). Her blood pressure is 141/80 (abnormal) and her pulse is 75. Her respiration is 18 and oxygen saturation is 98%. .    Satisfactory skin healing in radiotherapy fields.  Mild hyperpigmentation still over her left breast, but overall the skin has healed very nicely.    Lab Findings: Lab Results  Component Value Date   WBC 6.4 05/24/2018   HGB 12.9 05/24/2018   HCT 40.0 05/24/2018   MCV 88.9 05/24/2018   PLT 306 05/24/2018    Radiographic Findings: No results found.  Impression/Plan: Healing well from radiotherapy to the breast tissue.  Continue skin care with topical Vitamin E Oil and / or lotion for at least 2 more months for further healing.  I encouraged her to continue with yearly mammography and followup with medical oncology. I will see her back on an as-needed basis. I have encouraged her to call if she has any issues or concerns in the future. I wished her the very best.  _____________________________________   Eppie Gibson, MD  This document serves as a record of services personally performed by Eppie Gibson, MD. It was created on her behalf by Steva Colder, a trained medical scribe. The creation of this record is based on the scribe's personal observations and the provider's statements to them. This document has been checked and approved by the attending provider.

## 2018-07-29 ENCOUNTER — Other Ambulatory Visit: Payer: Self-pay | Admitting: Hematology

## 2018-07-29 DIAGNOSIS — F419 Anxiety disorder, unspecified: Secondary | ICD-10-CM

## 2018-07-29 DIAGNOSIS — C50412 Malignant neoplasm of upper-outer quadrant of left female breast: Secondary | ICD-10-CM

## 2018-07-29 DIAGNOSIS — Z171 Estrogen receptor negative status [ER-]: Principal | ICD-10-CM

## 2018-07-30 ENCOUNTER — Other Ambulatory Visit: Payer: Self-pay | Admitting: Hematology

## 2018-07-30 DIAGNOSIS — Z171 Estrogen receptor negative status [ER-]: Secondary | ICD-10-CM

## 2018-07-30 DIAGNOSIS — C50412 Malignant neoplasm of upper-outer quadrant of left female breast: Secondary | ICD-10-CM

## 2018-07-30 DIAGNOSIS — F419 Anxiety disorder, unspecified: Secondary | ICD-10-CM

## 2018-07-30 MED ORDER — GABAPENTIN 100 MG PO CAPS: 100.0000 mg | ORAL_CAPSULE | Freq: Three times a day (TID) | ORAL | 3 refills | Status: DC

## 2018-07-30 MED ORDER — ALPRAZOLAM 1 MG PO TABS: ORAL_TABLET | ORAL | 1 refills | Status: DC

## 2018-08-03 ENCOUNTER — Telehealth: Payer: Self-pay

## 2018-08-03 NOTE — Telephone Encounter (Signed)
Faxed script for Lidoderm patches to [facility], sent to HIM for scan to chart.

## 2018-08-09 ENCOUNTER — Ambulatory Visit: Payer: Medicare Other | Admitting: Physician Assistant

## 2018-09-02 ENCOUNTER — Other Ambulatory Visit: Payer: Self-pay | Admitting: Nurse Practitioner

## 2018-09-02 DIAGNOSIS — C50412 Malignant neoplasm of upper-outer quadrant of left female breast: Secondary | ICD-10-CM

## 2018-09-02 DIAGNOSIS — Z171 Estrogen receptor negative status [ER-]: Principal | ICD-10-CM

## 2018-09-06 ENCOUNTER — Other Ambulatory Visit: Payer: Self-pay | Admitting: Hematology

## 2018-09-06 DIAGNOSIS — M25562 Pain in left knee: Secondary | ICD-10-CM

## 2018-09-06 DIAGNOSIS — Z171 Estrogen receptor negative status [ER-]: Principal | ICD-10-CM

## 2018-09-06 DIAGNOSIS — C50412 Malignant neoplasm of upper-outer quadrant of left female breast: Secondary | ICD-10-CM

## 2018-09-06 DIAGNOSIS — M25561 Pain in right knee: Secondary | ICD-10-CM

## 2018-09-06 MED ORDER — LIDOCAINE 5 % EX PTCH: 0.5000 | MEDICATED_PATCH | Freq: Every day | CUTANEOUS | 0 refills | Status: DC | PRN

## 2018-09-06 MED ORDER — ALBUTEROL SULFATE HFA 108 (90 BASE) MCG/ACT IN AERS: 2.0000 | INHALATION_SPRAY | Freq: Four times a day (QID) | RESPIRATORY_TRACT | 2 refills | Status: DC | PRN

## 2018-09-09 ENCOUNTER — Telehealth: Payer: Self-pay | Admitting: Hematology

## 2018-09-09 NOTE — Telephone Encounter (Signed)
Tried to reach regarding cancellation per Dr. Burr Medico will reschedule upon Lacie return

## 2018-09-17 NOTE — Progress Notes (Signed)
[place]   [phone] [phone]   Clinic Follow up Note   Patient Care Team: Alycia Rossetti, MD as PCP - General (Family Medicine)  Date of Service:  09/20/2018  CHIEF COMPLAINT:  F/u triple negative left breast cancer   SUMMARY OF ONCOLOGIC HISTORY: Oncology History   Cancer Staging Malignant neoplasm of upper-outer quadrant of left breast in female, estrogen receptor negative ([place]) Staging form: Breast, AJCC 8th Edition - Pathologic stage from 08/19/2017: Stage IIA (pT2, pN0(sn), cM0, G3, ER: Negative, PR: Negative, HER2: Negative) - Signed by Ardath Sax, MD on 09/01/2017       Malignant neoplasm of upper-outer quadrant of left breast in female, estrogen receptor negative ([place])   06/16/2017 Initial Diagnosis    Malignant neoplasm of upper-outer quadrant of left breast in female: Initially presented with left deltoid mass which was consistent with lipoma.  During examination, a mass in the left breast was appreciated by palpation.    06/16/2017 Mammogram    BL Diagnostic Study: Irregular hyperdense mass in the upper outer quadrant of the left breast measuring 3.6 cm without radiographically detectable lymphadenopathy.  Normal appearance of the right breast.    06/16/2017 Breast US    3.2 cm irregular mass located at 2:30 o'clock, no pathological lymphadenopathy.    06/23/2017 Pathology Results    Ultrasound-guided biopsy: Positive for invasive ductal carcinoma, grade 3, ER 0%, PR 0%, HER-2 negative by [facility]    07/31/2017 Surgery    Partial left mastectomy with sentinel lymph node biopsy --Pathology: Invasive ductal carcinoma, measuring 4.5 cm, grade 3.  All margins negative, but the anterior margin less than 0.1 cm.  No surrounding DCIS.  0/5 lymph nodes positive for malignancy    10/08/2017 PET scan    IMPRESSION: 1. No evidence breast cancer metastasis on FDG PET-CT scan. 2. Postsurgical change in the LEFT lateral breast and  LEFT axilla.    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 Uncertain Significance The following genes were evaluated for sequence changes and exonic deletions/duplications: ALK, APC, ATM, AXIN2, BAP1, BARD1, BLM, BMPR1A, BRCA1, BRCA2, BRIP1, CASR, CDC73, CDH1, CDK4, CDKN1B, CDKN1C, CDKN2A (p14ARF), CDKN2A (p16INK4a), CEBPA, CHEK2, CTNNA1, DICER1, DIS3L2, EPCAM*, FH, FLCN, GATA2, GPC3, GREM1*, HRAS, KIT, MAX, MEN1, MET, MLH1, MSH2, MSH3, MSH6, MUTYH, NBN, NF1, NF2, PALB2, PDGFRA, PHOX2B*, PMS2, POLD1, POLE, POT1, PRKAR1A, PTCH1, PTEN, RAD50, RAD51C, RAD51D, RB1, RECQL4, RET, RUNX1, SDHAF2, SDHB, SDHC, SDHD, SMAD4, SMARCA4, SMARCB1, SMARCE1, STK11, SUFU, TERC, TERT, TMEM127, TP53, TSC1, TSC2, VHL, WRN*, WT1 The following genes were evaluated for sequence changes only: EGFR*, HOXB13*, MITF*, NTHL1*, SDHA Results are negative unless otherwise indicated    05/27/2018 - 06/23/2018 Radiation Therapy    Radiation treatment dates:   05/27/2018 - 06/23/2018  Site/dose:    1. Left Breast / 40.05 Gy in 15 fractions 2. Left Breast Boost / 10 Gy in 5 fractions - Total dose of 50.05 Gy  Beams/energy:    1. 3D / 6X Photon 2. Isodose / 10X, 6X Photon  Narrative: The patient tolerated radiation treatment relatively well.  She experienced fatigue and some expected skin irritation with mild erythema and soreness to her breast/axilla. She is applying Radiaplex twice daily as directed. She also mentioned bilateral hand cramping with bruising.  She was advised to discuss this with her PCP as it is not related to her radiation treatment.      CURRENT THERAPY:  Surveillance   INTERVAL HISTORY:  Mary Wise is here for a follow up of  left breast cancer. She presents to the clinic today with family member. She notes she has been in pain of her left side axilla, breast and flank areas and feels 3 lumps in her left breast with shooting pain around nipple. She notes she has not done PT after surgery. She has not taken anything for her pain.  She would like a prescription for bra to get it covered.     REVIEW OF SYSTEMS:   Constitutional: Denies fevers, chills or abnormal weight loss Eyes: Denies blurriness of vision Ears, nose, mouth, throat, and face: Denies mucositis or sore throat Respiratory: Denies cough, dyspnea or wheezes Cardiovascular: Denies palpitation, chest discomfort or lower extremity swelling Gastrointestinal:  Denies nausea, heartburn or change in bowel habits Skin: Denies abnormal skin rashes Lymphatics: Denies new lymphadenopathy or easy bruising Neurological:Denies numbness, tingling or new weaknesses Behavioral/Psych: Mood is stable, no new changes  All other systems were reviewed with the patient and are negative.  MEDICAL HISTORY:  Past Medical History:  Diagnosis Date  . Anxiety   . Asthma   . Cancer [facility])    left breast cancer  . Family history of cancer   . Genetic testing 02/01/2018   Multi-Cancer panel (83 genes) @ [facility] - No pathogenic mutations detected  . Hemorrhoids   . History of radiation therapy 05/27/18- 06/23/18   Left Breast 40.05 Gy in 15 fractions, left breast boost 10 Gy in 5 fractions.   . Lipoma of left shoulder   . Migraine   . Migraine   . 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 ABDOMINAL HYSTERECTOMY    . APPENDECTOMY    . CESAREAN SECTION    . CHOLECYSTECTOMY    . ERCP    . IR FLUORO GUIDE PORT INSERTION RIGHT  10/05/2017  . IR REMOVAL TUN ACCESS W/ PORT W/O FL MOD SED  05/13/2018  . IR US GUIDE VASC ACCESS RIGHT  10/05/2017  . LIPOMA EXCISION Left 03/16/2018   left  shoulder  . LIVER SURGERY    . PARTIAL MASTECTOMY WITH AXILLARY SENTINEL LYMPH NODE BIOPSY Left 07/31/2017   Procedure: LEFT PARTIAL MASTECTOMY WITH AXILLARY SENTINEL LYMPH NODE BIOPSY;  Surgeon: Virl Cagey, MD;  Location: [facility];  Service: General;  Laterality: Left;    I have reviewed the social history and family history with the patient and they are unchanged from previous note.  ALLERGIES:  is allergic to onion; penicillins; sulfa antibiotics; yellow dye; tape; and clindamycin/lincomycin.  MEDICATIONS:  Current Outpatient Medications  Medication Sig Dispense Refill  . albuterol (PROVENTIL HFA;VENTOLIN HFA) 108 (90 Base) MCG/ACT inhaler Inhale 2 puffs into the lungs every 6 (six) hours as needed for wheezing or shortness of breath. 6.7 g 2  . albuterol (PROVENTIL) (2.5 MG/3ML) 0.083% nebulizer solution Take 3 mLs (2.5 mg total) by nebulization every 6 (six) hours as needed for wheezing or shortness of breath. 90 mL 3  . ALPRAZolam (XANAX) 1 MG tablet TAKE 1 TABLET BY MOUTH TWICE DAILY AS NEEDED FOR ANXIETY 60 tablet 1  . gabapentin (NEURONTIN) 100 MG capsule Take 1 capsule (100 mg total) by mouth 3 (three) times daily.  Take 1 capsule in am, 2 capsules in pm and 2 capsules at bedtime. 150 capsule 3  . HYDROcodone-acetaminophen (NORCO) 5-325 MG tablet Take 1 tablet by mouth every 6 (six) hours as needed for moderate pain. 30 tablet 0  . Hypromellose (ARTIFICIAL TEARS OP) Apply to eye 2 (two) times daily. Doesn't do drops in eyes does as a wash to flush eyes out    . ibuprofen (ADVIL,MOTRIN) 800 MG tablet Take 800 mg by mouth every 8 (eight) hours as needed.    . lidocaine (LIDODERM) 5 % Place 0.5-1 patches onto the skin daily as needed (pain). Remove & Discard patch within 12 hours or as directed by MD 14 patch 0  . lidocaine-prilocaine (EMLA) cream Apply 1 application topically as needed. 30 g 2  . Menthol, Topical Analgesic, (BIOFREEZE EX) Apply 1 application topically as needed  (pain).    . mirtazapine (REMERON) 7.5 MG tablet Take 1 tablet (7.5 mg total) by mouth at bedtime. 30 tablet 0  . ondansetron (ZOFRAN) 8 MG tablet Take 1 tablet (8 mg total) by mouth 2 (two) times daily as needed. Start on the third day after chemotherapy. 30 tablet 3  . oxyCODONE (OXY IR/ROXICODONE) 5 MG immediate release tablet Take 1 tablet (5 mg total) by mouth every 6 (six) hours as needed for severe pain. 10 tablet 0  . predniSONE (DELTASONE) 20 MG tablet 2 tabs poqday 1-3, 1 tabs poqday 4-6 9 tablet 0  . prochlorperazine (COMPAZINE) 10 MG tablet Take 1 tablet (10 mg total) by mouth every 6 (six) hours as needed for nausea or vomiting. 30 tablet 2  . SUMAtriptan (IMITREX) 100 MG tablet Take 1 tablet 146m  By mouth at onset of migraine, If symptoms persist 2 hours later then take additional dose.Maximum 2 per 24 hours 9 tablet 5  . zolpidem (AMBIEN) 10 MG tablet Take by mouth.    . zolpidem (AMBIEN) 10 MG tablet Take 1 tablet (10 mg total) by mouth at bedtime as needed for sleep. (Patient not taking: Reported on 06/18/2018) 30 tablet 3   No current facility-administered medications for this visit.     PHYSICAL EXAMINATION: ECOG PERFORMANCE STATUS: 1 - Symptomatic but completely ambulatory  Vitals:   09/20/18 1003  BP: 133/86  Pulse: 80  Resp: 18  Temp: 97.7 F (36.5 C)  SpO2: 98%   Filed Weights   09/20/18 1003  Weight: 193 lb 6.4 oz (87.7 kg)    GENERAL:alert, no distress and comfortable SKIN: skin color, texture, turgor are normal, no rashes or significant lesions EYES: normal, Conjunctiva are pink and non-injected, sclera clear OROPHARYNX:no exudate, no erythema and lips, buccal mucosa, and tongue normal  NECK: supple, thyroid normal size, non-tender, without nodularity LYMPH:  no palpable lymphadenopathy in the cervical, axillary or inguinal LUNGS: clear to auscultation and percussion with normal breathing effort HEART: regular rate & rhythm and no murmurs and no lower  extremity edema ABDOMEN:abdomen soft, non-tender and normal bowel sounds Musculoskeletal:no cyanosis of digits and no clubbing  NEURO: alert & oriented x 3 with fluent speech, no focal motor/sensory deficits BREAST: S/p partial left mastectomy: Surgical incision healed well (+) mild left breast edema (+) a few 0.5cm subcutaneous nodules in low axilla/lateral breast with tenderness   LABORATORY DATA:  I have reviewed the data as listed CBC Latest Ref Rng & Units 09/20/2018 05/24/2018 05/13/2018  WBC 4.0 - 10.5 K/uL 5.2 6.4 5.6  Hemoglobin 12.0 - 15.0 g/dL 13.6 12.9 12.7  Hematocrit 36.0 -  46.0 % 41.6 40.0 39.3  Platelets 150 - 400 K/uL 301 306 299     CMP Latest Ref Rng & Units 09/20/2018 05/24/2018 02/23/2018  Glucose 70 - 99 mg/dL 83 88 81  BUN 8 - 23 mg/dL _0 Creatinine 0.44 - 1.00 mg/dL 0.87 0.78 0.72  Sodium 135 - 145 mmol/L 140 142 140  Potassium 3.5 - 5.1 mmol/L 3.7 3.7 4.0  Chloride 98 - 111 mmol/L 102 105 105  CO2 22 - 32 mmol/L _1 Calcium 8.9 - 10.3 mg/dL 9.6 9.7 9.3  Total Protein 6.5 - 8.1 g/dL 7.7 7.8 7.6  Total Bilirubin 0.3 - 1.2 mg/dL 0.4 0.3 <0.2(L)  Alkaline Phos 38 - 126 U/L 126 123 111  AST 15 - 41 U/L _2 ALT 0 - 44 U/L _3 RADIOGRAPHIC STUDIES: I have personally reviewed the radiological images as listed and agreed with the findings in the report. No results found.   ASSESSMENT & PLAN:  JALEA BRONAUGH is a 68 y.o. female with   1. 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  Proliferation marker Ki67 at 90% -She was diagnosed in 06/2017. She is s/p partial left mastectomy, Adjuvant chemo AC-T and adjuvant radiation.  -We previously discussed the breast cancer surveillance after her surgery. She will continue annual screening mammogram, self exam, and a routine office visit with lab and exam with [place]. -Labs revewied, CBC WNL, CMP also WNL. Breast exam showed  very tender left breast with a few nodules on lateral side of breast incision. I will get breast US to further evaluate. I suspect this is related to her surgery.  -Survivorship clinical with NP Laice in 3 months, F/u in 6 months with me    2. Left breast, axilla pain  -Based on exam she has a few nodules of her left breast. To further evaluate I will get breast US. I think her nodules are likely related to her surgery.  -Her shooting breast pain and left arm limited ROM is related to her surgery  -I recommend she follow up with her surgeon and that she start PT. She is agreeable.   3. Anxiety, insomnia  -She has chronic insomnia, tried trazodone, melatonin, and benadryl in the past for Insomnia -She takes Xanax PRN but has found herself using it more often since her Breast Cancer diagnosis.  -I dicussed seeing a psychiatrist or counselor. She declined.  -She remains on Ambien and Xanax, which help her anxiety and insomnia   4. Large left upper arm lipoma, s/p surgery on 03/15/18 with Dr. Sherlynn Stalls   5. Genetic testing was negative  6.  Arthralgia and back pain -She states she has been on hydrocodone occasionally (she gets from ED visits) and oxycodone as needed in the past  -I do not plan to refill her narcotics in the future  -Controlled with Lidocaine patches as needed.  7. Peripheral neuropathy, G2  -She has developed worsening peripheral neuropathy lately, especially on her feet -Controlled on Gabapentin 250m in AM, 3026min afternoon and 30041mn the evening  Plan -I gave prescription for surgical bra today  -Survivorship with Lacie in 3 months  -Lab and f/u in 6 months  -Left breast US [place] in 1-2 weeks  -she will call her breast surgeon for f/u appointment  -PT referral to [facility] re[facility]  No problem-specific Assessment & Plan notes found for this encounter.   Orders Placed This Encounter  Procedures  . US BREAST LTD UNI LEFT INC AXILLA    Standing Status:   Future     Standing Expiration Date:   11/19/2019    Scheduling Instructions:     Pt does not want mammogram    Order Specific Question:   Reason for Exam (SYMPTOM  OR DIAGNOSIS REQUIRED)    Answer:   small nodules below left axilla, rule out cancer recurrence    Order Specific Question:   Preferred imaging location?    Answer:   [facility]  . Ambulatory referral to Physical Therapy    Referral Priority:   Routine    Referral Type:   Physical Medicine    Referral Reason:   Specialty Services Required    Requested Specialty:   Physical Therapy    Number of Visits Requested:   1   All questions were answered. The patient knows to call the clinic with any problems, questions or concerns. No barriers to learning was detected. I spent 20 minutes counseling the patient face to face. The total time spent in the appointment was 25 minutes and more than 50% was on counseling and review of test results     Truitt Merle, MD 09/20/2018   I, Joslyn Devon, am acting as scribe for Truitt Merle, MD.   I have reviewed the above documentation for accuracy and completeness, and I agree with the above.

## 2018-09-20 ENCOUNTER — Encounter: Payer: Self-pay | Admitting: Hematology

## 2018-09-20 ENCOUNTER — Telehealth: Payer: Self-pay | Admitting: Hematology

## 2018-09-20 ENCOUNTER — Inpatient Hospital Stay: Payer: Medicare Other

## 2018-09-20 ENCOUNTER — Inpatient Hospital Stay: Payer: Medicare Other | Attending: Hematology | Admitting: Hematology

## 2018-09-20 VITALS — BP 133/86 | HR 80 | Temp 97.7°F | Resp 18 | Ht 68.0 in | Wt 193.4 lb

## 2018-09-20 DIAGNOSIS — G620 Drug-induced polyneuropathy: Secondary | ICD-10-CM

## 2018-09-20 DIAGNOSIS — C50412 Malignant neoplasm of upper-outer quadrant of left female breast: Secondary | ICD-10-CM

## 2018-09-20 DIAGNOSIS — N644 Mastodynia: Secondary | ICD-10-CM | POA: Insufficient documentation

## 2018-09-20 DIAGNOSIS — G4700 Insomnia, unspecified: Secondary | ICD-10-CM

## 2018-09-20 DIAGNOSIS — N6320 Unspecified lump in the left breast, unspecified quadrant: Secondary | ICD-10-CM | POA: Diagnosis not present

## 2018-09-20 DIAGNOSIS — I10 Essential (primary) hypertension: Secondary | ICD-10-CM

## 2018-09-20 DIAGNOSIS — Z171 Estrogen receptor negative status [ER-]: Secondary | ICD-10-CM | POA: Insufficient documentation

## 2018-09-20 DIAGNOSIS — F419 Anxiety disorder, unspecified: Secondary | ICD-10-CM | POA: Diagnosis not present

## 2018-09-20 LAB — CBC WITH DIFFERENTIAL (CANCER CENTER ONLY)
ABS IMMATURE GRANULOCYTES: 0.01 10*3/uL (ref 0.00–0.07)
BASOS PCT: 1 %
Basophils Absolute: 0.1 10*3/uL (ref 0.0–0.1)
EOS PCT: 3 %
Eosinophils Absolute: 0.2 10*3/uL (ref 0.0–0.5)
HCT: 41.6 % (ref 36.0–46.0)
Hemoglobin: 13.6 g/dL (ref 12.0–15.0)
Immature Granulocytes: 0 %
Lymphocytes Relative: 30 %
Lymphs Abs: 1.6 10*3/uL (ref 0.7–4.0)
MCH: 29.8 pg (ref 26.0–34.0)
MCHC: 32.7 g/dL (ref 30.0–36.0)
MCV: 91 fL (ref 80.0–100.0)
MONO ABS: 0.5 10*3/uL (ref 0.1–1.0)
MONOS PCT: 10 %
Neutro Abs: 2.9 10*3/uL (ref 1.7–7.7)
Neutrophils Relative %: 56 %
PLATELETS: 301 10*3/uL (ref 150–400)
RBC: 4.57 MIL/uL (ref 3.87–5.11)
RDW: 12.6 % (ref 11.5–15.5)
WBC: 5.2 10*3/uL (ref 4.0–10.5)
nRBC: 0 % (ref 0.0–0.2)

## 2018-09-20 LAB — CMP (CANCER CENTER ONLY)
ALK PHOS: 126 U/L (ref 38–126)
ALT: 24 U/L (ref 0–44)
ANION GAP: 9 (ref 5–15)
AST: 25 U/L (ref 15–41)
Albumin: 3.9 g/dL (ref 3.5–5.0)
BUN: 14 mg/dL (ref 8–23)
CALCIUM: 9.6 mg/dL (ref 8.9–10.3)
CHLORIDE: 102 mmol/L (ref 98–111)
CO2: 29 mmol/L (ref 22–32)
CREATININE: 0.87 mg/dL (ref 0.44–1.00)
Glucose, Bld: 83 mg/dL (ref 70–99)
Potassium: 3.7 mmol/L (ref 3.5–5.1)
SODIUM: 140 mmol/L (ref 135–145)
Total Bilirubin: 0.4 mg/dL (ref 0.3–1.2)
Total Protein: 7.7 g/dL (ref 6.5–8.1)

## 2018-09-20 NOTE — Addendum Note (Signed)
Addended by: Truitt Merle on: 09/20/2018 04:07 PM   Modules accepted: Orders

## 2018-09-20 NOTE — Telephone Encounter (Signed)
Scheduled appt per 2/17 los.  [name] will contact patient in regards to the left breast.  Printed and mailed calendar.

## 2018-09-27 ENCOUNTER — Other Ambulatory Visit: Payer: Self-pay | Admitting: Hematology

## 2018-09-27 ENCOUNTER — Telehealth: Payer: Self-pay

## 2018-09-27 NOTE — Telephone Encounter (Signed)
Patient calls stating she was supposed to have test on her left breast and shoulder done.  Doesn't want to go to [facility] in [place] after all.  She is requesting Dr. Burr Medico set up in [place].

## 2018-09-28 ENCOUNTER — Telehealth: Payer: Self-pay

## 2018-09-28 ENCOUNTER — Other Ambulatory Visit: Payer: Self-pay

## 2018-09-28 DIAGNOSIS — Z171 Estrogen receptor negative status [ER-]: Principal | ICD-10-CM

## 2018-09-28 DIAGNOSIS — C50412 Malignant neoplasm of upper-outer quadrant of left female breast: Secondary | ICD-10-CM

## 2018-09-28 NOTE — Telephone Encounter (Signed)
Spoke with patient regarding appointment for mammogram and breast US scheduled for 10/05/2018 at 10:00 at [facility]. Patient verbalized an understanding.

## 2018-10-07 ENCOUNTER — Other Ambulatory Visit: Payer: Self-pay | Admitting: Hematology

## 2018-10-07 DIAGNOSIS — C50412 Malignant neoplasm of upper-outer quadrant of left female breast: Secondary | ICD-10-CM

## 2018-10-07 DIAGNOSIS — Z171 Estrogen receptor negative status [ER-]: Principal | ICD-10-CM

## 2018-10-09 ENCOUNTER — Other Ambulatory Visit: Payer: Self-pay | Admitting: Hematology

## 2018-10-09 DIAGNOSIS — F419 Anxiety disorder, unspecified: Secondary | ICD-10-CM

## 2018-10-11 ENCOUNTER — Other Ambulatory Visit: Payer: Self-pay

## 2018-10-11 ENCOUNTER — Ambulatory Visit (INDEPENDENT_AMBULATORY_CARE_PROVIDER_SITE_OTHER): Payer: Medicare Other | Admitting: Family Medicine

## 2018-10-11 ENCOUNTER — Other Ambulatory Visit: Payer: Self-pay | Admitting: Hematology

## 2018-10-11 ENCOUNTER — Encounter: Payer: Self-pay | Admitting: Family Medicine

## 2018-10-11 ENCOUNTER — Telehealth (HOSPITAL_COMMUNITY): Payer: Self-pay

## 2018-10-11 VITALS — BP 138/76 | HR 76 | Temp 98.0°F | Resp 14 | Ht 68.0 in | Wt 198.0 lb

## 2018-10-11 DIAGNOSIS — I10 Essential (primary) hypertension: Secondary | ICD-10-CM

## 2018-10-11 DIAGNOSIS — Z0000 Encounter for general adult medical examination without abnormal findings: Secondary | ICD-10-CM | POA: Diagnosis not present

## 2018-10-11 DIAGNOSIS — C50412 Malignant neoplasm of upper-outer quadrant of left female breast: Secondary | ICD-10-CM | POA: Diagnosis not present

## 2018-10-11 DIAGNOSIS — G620 Drug-induced polyneuropathy: Secondary | ICD-10-CM | POA: Diagnosis not present

## 2018-10-11 DIAGNOSIS — Z171 Estrogen receptor negative status [ER-]: Secondary | ICD-10-CM

## 2018-10-11 DIAGNOSIS — N951 Menopausal and female climacteric states: Secondary | ICD-10-CM

## 2018-10-11 DIAGNOSIS — Z8349 Family history of other endocrine, nutritional and metabolic diseases: Secondary | ICD-10-CM | POA: Diagnosis not present

## 2018-10-11 DIAGNOSIS — Z13220 Encounter for screening for lipoid disorders: Secondary | ICD-10-CM | POA: Diagnosis not present

## 2018-10-11 DIAGNOSIS — F419 Anxiety disorder, unspecified: Secondary | ICD-10-CM | POA: Diagnosis not present

## 2018-10-11 DIAGNOSIS — M25562 Pain in left knee: Secondary | ICD-10-CM

## 2018-10-11 DIAGNOSIS — M25561 Pain in right knee: Secondary | ICD-10-CM | POA: Diagnosis not present

## 2018-10-11 DIAGNOSIS — R6889 Other general symptoms and signs: Secondary | ICD-10-CM | POA: Diagnosis not present

## 2018-10-11 DIAGNOSIS — G629 Polyneuropathy, unspecified: Secondary | ICD-10-CM | POA: Insufficient documentation

## 2018-10-11 DIAGNOSIS — G43709 Chronic migraine without aura, not intractable, without status migrainosus: Secondary | ICD-10-CM | POA: Diagnosis not present

## 2018-10-11 MED ORDER — LIDOCAINE 5 % EX PTCH: 0.5000 | MEDICATED_PATCH | Freq: Every day | CUTANEOUS | 2 refills | Status: DC | PRN

## 2018-10-11 MED ORDER — ALPRAZOLAM 1 MG PO TABS: ORAL_TABLET | ORAL | 1 refills | Status: DC

## 2018-10-11 MED ORDER — GABAPENTIN 400 MG PO CAPS: ORAL_CAPSULE | ORAL | 6 refills | Status: DC

## 2018-10-11 NOTE — Progress Notes (Signed)
Subjective:   Patient presents for Medicare Annual/Subsequent preventive examination.     Dr. 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       Migraine disorder- Uses imitrex - fills every 2 months or so       Asthma -has had entire life,[place], has nebuiuzer and albuterol inhaler    HTN- whie coat hypertension, [facility]     CBC/CMEET normal   Mild Hyperlipidemia  History of arthritis in hands, back, has scoliosis, uses salonpas / lidoderm   Using lidoderm patches for her neuropathy    Daughter Ileene Patrick is POA by default, has forms completed for where she lives, Full Code    History of B12 def   OA knees- planning to have surgery on knees as soon as she can    Review Past Medical/Family/Social: Per EMR    Risk Factors  Current exercise habits: Keeps active Dietary issues discussed: No major concerns   Cardiac risk factors: Obesity (BMI >= 30 kg/m2). HTN  Depression Screen  (Note: if answer to either of the following is "Yes", a more complete depression screening is indicated)  Over the past two weeks, have you felt down, depressed or hopeless? No Over the past two weeks, have you felt little interest or pleasure in doing things? No Have you lost interest or pleasure in daily life? No Do you often feel hopeless? No Do you cry easily over simple problems? No    Activities of Daily Living  In your present state of health, do you have any difficulty performing the following activities?:  Driving? No  Managing money? No  Feeding yourself? No  Getting from bed to chair? No  Climbing a flight of stairs? No  Preparing food and eating?: No  Bathing or showering? No  Getting dressed: No  Getting to the toilet? No  Using the toilet:No  Moving around from place to place: No  In the past year have you fallen or had a near fall?:No  Are you sexually active? No  Do you have more than one partner? No   Hearing Difficulties: No  Do you often ask people to speak up or repeat themselves? No  Do you experience ringing or noises in your ears? No Do you have difficulty understanding soft or whispered voices? No  Do you feel that you have a problem with memory? No Do you often misplace items? No  Do you feel safe at home? Yes  Cognitive Testing  Alert? Yes Normal Appearance?Yes  Oriented to person? Yes Place? Yes  Time? Yes  Recall of three objects? Yes  Can perform simple calculations? Yes  Displays appropriate judgment?Yes  Can read the correct time from a watch face?Yes   List the Names of Other Physician/Practitioners you currently use:  Oncology- [facility]   Screening Tests / Date Colonoscopy      Due Bon Density- Due                Zostavax -  Declines  Pneumonia- declines  Mammogram  Influenza Vaccine Declines  Tetanus/tdap Declines   ROS: GEN- denies fatigue, fever, weight loss,weakness, recent illness HEENT- denies eye drainage, change in vision, nasal discharge, CVS- denies chest pain, palpitations RESP- denies SOB, cough, wheeze ABD- denies N/V, change in stools, abd pain GU- denies dysuria, hematuria, dribbling, incontinence MSK- + joint pain, muscle aches, injury Neuro- denies headache, dizziness, syncope, seizure activity  GEN- NAD, alert and oriented ,vitals reviewed  HEENT- PERRL, EOMI, non injected sclera, pink  conjunctiva, MMM, oropharynx clear, TM clear no effusion, nares clear  Neck- Supple, no thryomegaly, no bruit  CVS- RRR, no murmur RESP-CTAB ABD-NABS,soft,NT,ND MSK- decreased ROM Lower ext, antalgic stiff gait  EXT- No edema Pulses- Radial, DP- 2+   Assessment:    Annual wellness medicare exam   Plan:    During the course of the visit the patient was educated and counseled about appropriate screening and preventive services including:     Pt is Full Code   Cologuard to be done for screening    Check fasting lipid     Neuropathy - change gabapentin 400mg  TID      Check b12, has history of B12 def   Fall /depression/Audit C screening negative   HTN- white coat per discussion no current meds  Chronic pain/OA- using lidoderm patches refilled today    Chronic insomnia- ambien prn   GAD- prn xanax  Migraines- imitrex prn   Bone Density to be scheduled        Diet review for nutrition referral? Yes ____ Not Indicated __x__  Patient Instructions (the written plan) was given to the patient.  Medicare Attestation  I have personally reviewed:  The patient's medical and social history  Their use of alcohol, tobacco or illicit drugs  Their current medications and supplements  The patient's functional ability including ADLs,fall risks, home safety risks, cognitive, and hearing and visual impairment  Diet and physical activities  Evidence for depression or mood disorders  The patient's weight, height, BMI, and visual acuity have been recorded in the chart. I have made referrals, counseling, and provided education to the patient based on review of the above and I have provided the patient with a written personalized care plan for preventive services.

## 2018-10-11 NOTE — Patient Instructions (Addendum)
Cologuard Testing  Set up bone density  Gabapentin increased to 400mg  tablet  F/U 6 months

## 2018-10-11 NOTE — Telephone Encounter (Signed)
L/m asking pt to call us back -Mary Wise was wanting to know if the patient had Lymphedema? LE is questioning should this be PT Lymphedema eval. NF 10/11/2018

## 2018-10-12 ENCOUNTER — Ambulatory Visit (HOSPITAL_COMMUNITY)
Admission: RE | Admit: 2018-10-12 | Discharge: 2018-10-12 | Disposition: A | Discharge status: Home / Self Care | Payer: Medicare Other | Source: Ambulatory Visit | Attending: Hematology | Admitting: Hematology

## 2018-10-12 ENCOUNTER — Encounter (HOSPITAL_COMMUNITY): Payer: Self-pay

## 2018-10-12 ENCOUNTER — Ambulatory Visit (HOSPITAL_COMMUNITY): Payer: Medicare Other

## 2018-10-12 ENCOUNTER — Ambulatory Visit (HOSPITAL_COMMUNITY): Payer: Medicare Other | Attending: Hematology

## 2018-10-12 ENCOUNTER — Other Ambulatory Visit: Payer: Self-pay

## 2018-10-12 DIAGNOSIS — Z171 Estrogen receptor negative status [ER-]: Principal | ICD-10-CM

## 2018-10-12 DIAGNOSIS — C50412 Malignant neoplasm of upper-outer quadrant of left female breast: Secondary | ICD-10-CM | POA: Diagnosis not present

## 2018-10-12 DIAGNOSIS — R928 Other abnormal and inconclusive findings on diagnostic imaging of breast: Secondary | ICD-10-CM | POA: Diagnosis not present

## 2018-10-12 DIAGNOSIS — R29898 Other symptoms and signs involving the musculoskeletal system: Secondary | ICD-10-CM | POA: Diagnosis not present

## 2018-10-12 DIAGNOSIS — M25612 Stiffness of left shoulder, not elsewhere classified: Secondary | ICD-10-CM

## 2018-10-12 DIAGNOSIS — M25512 Pain in left shoulder: Secondary | ICD-10-CM | POA: Diagnosis not present

## 2018-10-12 DIAGNOSIS — N6489 Other specified disorders of breast: Secondary | ICD-10-CM | POA: Diagnosis not present

## 2018-10-12 LAB — LIPID PANEL
CHOL/HDL RATIO: 4.2 (calc) (ref <5.0)
Cholesterol: 241 mg/dL — ABNORMAL HIGH (ref <200)
HDL: 57 mg/dL (ref >=50)
LDL Cholesterol (Calc): 149 mg/dL (calc) — ABNORMAL HIGH
NON-HDL CHOLESTEROL (CALC): 184 mg/dL — AB (ref <130)
Triglycerides: 211 mg/dL — ABNORMAL HIGH (ref <150)

## 2018-10-12 LAB — VITAMIN B12: Vitamin B-12: 287 pg/mL (ref 200–1100)

## 2018-10-12 NOTE — Therapy (Signed)
[place], [place], [address] Phone: [phone]   Fax:  [phone]  Occupational Therapy Evaluation  Patient Details  Name: Mary Wise MRN: [id number] Date of Birth: [birth year] Referring Provider (OT): Truitt Merle, MD   Encounter Date: 10/12/2018  OT End of Session - 10/12/18 1643    Visit Number  1    Number of Visits  12    Date for OT Re-Evaluation  11/23/18   mini reassess: 11/09/2018   Authorization Type  1) Medicare 2) medicaid    Authorization Time Period  No authorization requirements     OT Start Time  1350    OT Stop Time  1430    OT Time Calculation (min)  40 min    Activity Tolerance  Patient tolerated treatment well    Behavior During Therapy  [facility] for tasks assessed/performed       Past Medical History:  Diagnosis Date  . Anxiety   . Asthma   . Cancer [facility])    left breast cancer  . Family history of cancer   . Genetic testing 02/01/2018   Multi-Cancer panel (83 genes) @ [facility] - No pathogenic mutations detected  . Hemorrhoids   . History of radiation therapy 05/27/18- 06/23/18   Left Breast 40.05 Gy in 15 fractions, left breast boost 10 Gy in 5 fractions.   . Lipoma of left shoulder   . Migraine   . Migraine   . 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 ABDOMINAL HYSTERECTOMY    . APPENDECTOMY    . CESAREAN SECTION    . CHOLECYSTECTOMY    . ERCP    . IR FLUORO GUIDE PORT INSERTION RIGHT  10/05/2017  . IR REMOVAL TUN ACCESS W/ PORT W/O FL MOD SED  05/13/2018  . IR US GUIDE VASC ACCESS RIGHT  10/05/2017  . LIPOMA EXCISION Left 03/16/2018   left shoulder  . LIVER SURGERY    . PARTIAL MASTECTOMY WITH AXILLARY SENTINEL LYMPH NODE BIOPSY Left 07/31/2017   Procedure: LEFT PARTIAL MASTECTOMY WITH AXILLARY SENTINEL LYMPH NODE BIOPSY;  Surgeon: Virl Cagey, MD;  Location: [facility];  Service: General;  Laterality: Left;     There were no vitals filed for this visit.  Subjective Assessment - 10/12/18 1628    Subjective   S: i think I did something to this arm when I was getting groceries.     Pertinent History  Patient is a 68 y/o female S/P left arm pain,weakness, and decreased ROM S/P left breast lipoma removal which was completed on 07/31/18. Pt reports that she initially experienced her pain while grocery shopping. Due to the rain, patient heavy liquid items in her left hand while holding her keys and various other items in her right hand. Once she lifted her left arm to place items in the car she felt a pulling sensation and soon after her arm swelled. Pt reports her swelling has since decreased. She continues to experience strength, ROM, and pain deficits. Dr. Truitt Merle referred patient to occupational therapy for evaluation and treatment.     Patient Stated Goals  To return to the "[place]."    Currently in Pain?  Yes    Pain Score  6     Pain Location  Generalized    Pain Descriptors / Indicators  Sore    Pain Type  Chronic  pain    Pain Radiating Towards  N/A    Pain Onset  More than a month ago    Pain Frequency  Constant    Aggravating Factors   Unknown    Pain Relieving Factors  pain medication    Effect of Pain on Daily Activities  mod-max effect        [facility] OT Assessment - 10/12/18 1356      Assessment   Medical Diagnosis  left shoulder pain and decreased ROM.    Referring Provider (OT)  Truitt Merle, MD    Onset Date/Surgical Date  07/31/18   End of january 2020 felt a pulling in her arm.   Hand Dominance  Right    Next MD Visit  03/21/2019    Prior Therapy  None for this conition.       Precautions   Precautions  Other (comment)    Precaution Comments  History of cancer. No active cancer. Progress as tolerated per MD.       Restrictions   Weight Bearing Restrictions  No      Balance Screen   Has the patient fallen in the past 6 months  Yes    How many times?  --   Pt reports  several controlled falls from recliner   Has the patient had a decrease in activity level because of a fear of falling?   No    Is the patient reluctant to leave their home because of a fear of falling?   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 She enjoys being active and wishes to return to her previous activity level.       ADL   ADL comments  Difficulty with reaching overhead, behind her back, lifting heavy items, and reports decreased strength and activity tolerance overall.       Mobility   Mobility Status  Independent      Written Expression   Dominant Hand  Right      Vision - History   Baseline Vision  Wears glasses only for reading      Cognition   Overall Cognitive Status  Within Functional Limits for tasks assessed      Posture/Postural Control   Posture/Postural Control  Postural limitations    Postural Limitations  Rounded Shoulders      ROM / Strength   AROM / PROM / Strength  AROM;PROM;Strength      Palpation   Palpation comment  Max fascial restrictions in her left upper trapezius and latissimus dorsi region. Decreased muscle tone in area of lipoma removal. Healed incision.       AROM   Overall AROM Comments  Assessed seated. IR/er adducted    AROM Assessment Site  Shoulder    Right/Left Shoulder  Left    Left Shoulder Flexion  110 Degrees   Right Shoulder 150   Left Shoulder ABduction  140 Degrees   Right Shoulder 170   Left Shoulder Internal Rotation  90 Degrees   Right Shoulder 90   Left Shoulder External Rotation  73 Degrees   Right shoulder 90     PROM   Overall PROM Comments  Assessed supine. IR/er adducted    PROM Assessment Site  Shoulder    Right/Left Shoulder  Left    Left Shoulder Flexion  125 Degrees    Left Shoulder ABduction  106 Degrees     Left Shoulder Internal Rotation  90 Degrees    Left Shoulder External Rotation  40 Degrees      Strength   Overall Strength Comments  Assessed seated. IR/er adducted    Strength Assessment Site  Shoulder    Right/Left Shoulder  Left    Left Shoulder Flexion  3+/5   Right shoulder 5   Left Shoulder ABduction  4/5   Right 5   Left Shoulder Internal Rotation  5/5   Right 5   Left Shoulder External Rotation  3+/5   Right 3+      LYMPHEDEMA/ONCOLOGY QUESTIONNAIRE - 10/12/18 1400      Lymphedema Assessments   Lymphedema Assessments  Upper extremities      Right Upper Extremity Lymphedema   At Axilla   36 cm    15 cm Proximal to Olecranon Process  31.25 cm    Olecranon Process  26.5 cm    15 cm Proximal to Ulnar Styloid Process  24.5 cm    10 cm Proximal to Ulnar Styloid Process  21 cm    Just Proximal to Ulnar Styloid Process  17 cm    Across Hand at [employer]  20 cm      Left Upper Extremity Lymphedema   At Axilla   35.5 cm    15 cm Proximal to Olecranon Process  32.75 cm    Olecranon Process  26.5 cm    15 cm Proximal to Ulnar Styloid Process  23.5 cm    10 cm Proximal to Ulnar Styloid Process  19.5 cm    Just Proximal to Ulnar Styloid Process  17 cm    Across Hand at [employer]  20 cm                    OT Education - 10/12/18 1642    Education Details  table slides: flexion and abduction    Person(s) Educated  Patient    Methods  Explanation;Verbal cues;Handout    Comprehension  Verbalized understanding       OT Short Term Goals - 10/12/18 1651      OT SHORT TERM GOAL #1   Title  Patient will be educated and independent with HEP to faciliate progress in therapy and increase her ability to utilize her LUE as her non-dominant extremity 75% or more of the time.     Time  6    Period  Weeks    Status  New    Target Date  11/23/18      OT SHORT TERM GOAL #2   Title  Patient will increase A/ROM of the LUE to [facility] in order to increase ability  to reach above shoulder level and reach into cabinets with less difficulty.     Time  6    Period  Weeks    Status  New      OT SHORT TERM GOAL #3   Title  Patient will increase LUE strength to 4+/5 overall to increase ability to complete house cleaning tasks with more stability in her LUE.     Time  6    Period  Weeks    Status  New      OT SHORT TERM GOAL #4   Title  Patient will decrease fascial restrictions in her  LUE to min amount or less in order to increase functional mobility needed to complete reaching tasks.     Time  6    Period  Weeks    Status  New      OT SHORT TERM GOAL #5   Title  Patient will report a decreased pain level of 2/10 or less when utilizing her left UE for daily and leisure tasks.     Time  6    Period  Weeks    Status  New               Plan - 10/12/18 1644    Clinical Impression Statement  A: patient is a 68 y/o female S/P left lipoma removal causing increased pain, fascial restrictions, and decreased strength and ROM resulting in difficulty completing daily tasks using her LUE as her non-dominant. BUE measurements were obtained to assess possible lymphedema. No abnomal swelling noted in LUE. Patient with noticeable muscle atrophy.     OT Occupational Profile and History  Detailed Assessment- Review of Records and additional review of physical, cognitive, psychosocial history related to current functional performance    Occupational performance deficits (Please refer to evaluation for details):  ADL's;IADL's;Rest and Sleep;Work;Leisure    Body Structure / Function / Physical Skills  ADL;ROM;UE functional use;Scar mobility;Decreased knowledge of use of DME;Endurance;Pain;Strength;Fascial restriction    Rehab Potential  Excellent    Clinical Decision Making  Several treatment options, min-mod task modification necessary    Comorbidities Affecting Occupational Performance:  Presence of comorbidities impacting occupational performance     Comorbidities impacting occupational performance description:  History of cancer.     Modification or Assistance to Complete Evaluation   Min-Moderate modification of tasks or assist with assess necessary to complete eval    OT Frequency  2x / week    OT Duration  6 weeks    OT Treatment/Interventions  Self-care/ADL training;Electrical Stimulation;Therapeutic exercise;Patient/family education;Neuromuscular education;Moist Heat;Energy conservation;Therapeutic activities;Passive range of motion;Manual Therapy;DME and/or AE instruction;Ultrasound;Cryotherapy    Plan  P: Patient will benefit from skilled OT services to increase functional performance during daily tasks utilizing her LUE as her non-dominant extremity. Treatment Plan; Myofascial release, manual stretching, AA/ROM, A/ROM, general shoulder and scapular strengthening. Update HEP next session if needed.     Consulted and Agree with Plan of Care  Patient       Patient will benefit from skilled therapeutic intervention in order to improve the following deficits and impairments:  Body Structure / Function / Physical Skills  Visit Diagnosis: Other symptoms and signs involving the musculoskeletal system - Plan: Ot plan of care cert/re-cert  Stiffness of left shoulder, not elsewhere classified - Plan: Ot plan of care cert/re-cert  Acute pain of left shoulder - Plan: Ot plan of care cert/re-cert    Problem List Patient Active Problem List   Diagnosis Date Noted  . Peripheral neuropathy 10/11/2018  . Migraines 02/01/2018  . Insomnia 02/01/2018  . Genetic testing 02/01/2018  . Family history of cancer   . Port-A-Cath in place 10/12/2017  . Malignant neoplasm of upper-outer quadrant of left breast in female, estrogen receptor negative ([place])   . HTN, goal below 140/90 07/01/2017  . Anxiety 07/01/2017  . Lipoma of left shoulder 06/19/2017  . Left breast mass 06/19/2017     Ailene Ravel, OTR/L,CBIS  [phone]  10/12/2018,  5:00 PM  [place] [address] [place], [place], [address] Phone: [phone]   Fax:  [phone]  Name: Mary Wise MRN: [id number] Date of Birth: 1950-11-30

## 2018-10-12 NOTE — Patient Instructions (Addendum)
SHOULDER: Flexion On Table   Place hands on towel placed on table, elbows straight. Lean forward with you upper body, pushing towel away from body. Hold _2__ seconds. __10-15_ reps per set, __2_ sets per day  Abduction (Passive)   With arm out to side, resting on towel placed on table, keeping trunk away from table, lean to the side while pushing towel away from body. Hold __2__ seconds. Repeat _10-15___ times. Do ___2_ sessions per day.  Copyright  VHI. All rights reserved.

## 2018-10-13 ENCOUNTER — Telehealth: Payer: Self-pay

## 2018-10-13 NOTE — Telephone Encounter (Signed)
Faxed script for Lidocaine patches to [facility], sent to HIM for scan

## 2018-10-15 ENCOUNTER — Telehealth: Payer: Self-pay | Admitting: *Deleted

## 2018-10-15 NOTE — Telephone Encounter (Signed)
Received verbal orders for Cologuard.   Order placed via [facility].   Cologuard (Order [phone])

## 2018-10-19 ENCOUNTER — Other Ambulatory Visit: Payer: Self-pay

## 2018-10-19 ENCOUNTER — Encounter (HOSPITAL_COMMUNITY): Payer: Self-pay | Admitting: Occupational Therapy

## 2018-10-19 ENCOUNTER — Ambulatory Visit (HOSPITAL_COMMUNITY): Payer: Medicare Other | Admitting: Occupational Therapy

## 2018-10-19 DIAGNOSIS — M25612 Stiffness of left shoulder, not elsewhere classified: Secondary | ICD-10-CM

## 2018-10-19 DIAGNOSIS — M25512 Pain in left shoulder: Secondary | ICD-10-CM

## 2018-10-19 DIAGNOSIS — R29898 Other symptoms and signs involving the musculoskeletal system: Secondary | ICD-10-CM | POA: Diagnosis not present

## 2018-10-19 NOTE — Therapy (Signed)
[place], [place], [address] Phone: [phone]   Fax:  [phone]  Occupational Therapy Treatment  Patient Details  Name: Mary Wise MRN: [id number] Date of Birth: [birth year] Referring Provider (OT): Truitt Merle, MD   Encounter Date: 10/19/2018  OT End of Session - 10/19/18 1512    Visit Number  2    Number of Visits  12    Date for OT Re-Evaluation  11/23/18   mini reassess: 11/09/2018   Authorization Type  1) Medicare 2) medicaid    Authorization Time Period  No authorization requirements     OT Start Time  1432    OT Stop Time  1512    OT Time Calculation (min)  40 min    Activity Tolerance  Patient tolerated treatment well    Behavior During Therapy  [facility] for tasks assessed/performed       Past Medical History:  Diagnosis Date  . Anxiety   . Asthma   . Cancer [facility])    left breast cancer  . Family history of cancer   . Genetic testing 02/01/2018   Multi-Cancer panel (83 genes) @ [facility] - No pathogenic mutations detected  . Hemorrhoids   . History of radiation therapy 05/27/18- 06/23/18   Left Breast 40.05 Gy in 15 fractions, left breast boost 10 Gy in 5 fractions.   . Lipoma of left shoulder   . Migraine   . Migraine   . 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 ABDOMINAL HYSTERECTOMY    . APPENDECTOMY    . CESAREAN SECTION    . CHOLECYSTECTOMY    . ERCP    . IR FLUORO GUIDE PORT INSERTION RIGHT  10/05/2017  . IR REMOVAL TUN ACCESS W/ PORT W/O FL MOD SED  05/13/2018  . IR US GUIDE VASC ACCESS RIGHT  10/05/2017  . LIPOMA EXCISION Left 03/16/2018   left shoulder  . LIVER SURGERY    . PARTIAL MASTECTOMY WITH AXILLARY SENTINEL LYMPH NODE BIOPSY Left 07/31/2017   Procedure: LEFT PARTIAL MASTECTOMY WITH AXILLARY SENTINEL LYMPH NODE BIOPSY;  Surgeon: Virl Cagey, MD;  Location: [facility];  Service: General;  Laterality: Left;     There were no vitals filed for this visit.  Subjective Assessment - 10/19/18 1432    Subjective   S: I had a mammogram today so I'm a little sore today.     Currently in Pain?  Yes    Pain Score  3     Pain Location  Shoulder    Pain Orientation  Left    Pain Descriptors / Indicators  Sore    Pain Type  Chronic pain    Pain Radiating Towards  N/A    Pain Onset  In the past 7 days    Pain Frequency  Constant    Aggravating Factors   unknown    Pain Relieving Factors  pain medication    Effect of Pain on Daily Activities  min/mod effect on ADLs    Multiple Pain Sites  No         [facility] OT Assessment - 10/19/18 1432      Assessment   Medical Diagnosis  left shoulder pain and decreased ROM.      Precautions   Precautions  Other (comment)    Precaution Comments  History of cancer. No active cancer. Progress as tolerated  per MD.                OT Treatments/Exercises (OP) - 10/19/18 1437      Exercises   Exercises  Shoulder      Shoulder Exercises: Supine   Protraction  PROM;5 reps;AROM;10 reps    Horizontal ABduction  PROM;5 reps;AROM;10 reps    External Rotation  PROM;5 reps;AROM;10 reps    Internal Rotation  PROM;5 reps;AROM;10 reps    Flexion  PROM;5 reps;AROM;10 reps    ABduction  PROM;5 reps;AROM;10 reps      Shoulder Exercises: Seated   Protraction  AROM;10 reps    Horizontal ABduction  AROM;10 reps    External Rotation  AROM;10 reps    Internal Rotation  AROM;10 reps    Flexion  AROM;10 reps    Abduction  AROM;10 reps      Shoulder Exercises: ROM/Strengthening   Proximal Shoulder Strengthening, Seated  10X each no rest breaks      Manual Therapy   Manual Therapy  Myofascial release    Manual therapy comments  completed separately from therapeutic exercises    Myofascial Release  myofascial release to left upper arm, trapezius, and scapularis regions to decrease pain and fascial restrictions and increase joint ROM             OT  Education - 10/19/18 1500    Education Details  A/ROM exercises supine and standing    Person(s) Educated  Patient    Methods  Explanation;Verbal cues;Handout    Comprehension  Verbalized understanding;Returned demonstration       OT Short Term Goals - 10/19/18 1457      OT SHORT TERM GOAL #1   Title  Patient will be educated and independent with HEP to faciliate progress in therapy and increase her ability to utilize her LUE as her non-dominant extremity 75% or more of the time.     Time  6    Period  Weeks    Status  On-going    Target Date  11/23/18      OT SHORT TERM GOAL #2   Title  Patient will increase A/ROM of the LUE to [facility] in order to increase ability to reach above shoulder level and reach into cabinets with less difficulty.     Time  6    Period  Weeks    Status  On-going      OT SHORT TERM GOAL #3   Title  Patient will increase LUE strength to 4+/5 overall to increase ability to complete house cleaning tasks with more stability in her LUE.     Time  6    Period  Weeks    Status  On-going      OT SHORT TERM GOAL #4   Title  Patient will decrease fascial restrictions in her LUE to min amount or less in order to increase functional mobility needed to complete reaching tasks.     Time  6    Period  Weeks    Status  On-going      OT SHORT TERM GOAL #5   Title  Patient will report a decreased pain level of 2/10 or less when utilizing her left UE for daily and leisure tasks.     Time  6    Period  Weeks    Status  On-going               Plan - 10/19/18 1458    Clinical Impression Statement  A: Pt reports HEP is going well. Initiated myofascial release, passive stretching, and A/ROM this session. Also initiated proximal shoulder strengthening in sitting. Pt with ROM WNL both passive and active. Min fatigue during session, rest breaks provided as needed. Verbal cuing for form and technique.     Body Structure / Function / Physical Skills  ADL;ROM;UE functional  use;Scar mobility;Decreased knowledge of use of DME;Endurance;Pain;Strength;Fascial restriction    Plan  P: Follow up on HEP, continue with A/ROM, add scapular theraband       Patient will benefit from skilled therapeutic intervention in order to improve the following deficits and impairments:  Body Structure / Function / Physical Skills  Visit Diagnosis: Other symptoms and signs involving the musculoskeletal system  Stiffness of left shoulder, not elsewhere classified  Acute pain of left shoulder    Problem List Patient Active Problem List   Diagnosis Date Noted  . Peripheral neuropathy 10/11/2018  . Migraines 02/01/2018  . Insomnia 02/01/2018  . Genetic testing 02/01/2018  . Family history of cancer   . Port-A-Cath in place 10/12/2017  . Malignant neoplasm of upper-outer quadrant of left breast in female, estrogen receptor negative ([place])   . HTN, goal below 140/90 07/01/2017  . Anxiety 07/01/2017  . Lipoma of left shoulder 06/19/2017  . Left breast mass 06/19/2017   Guadelupe Sabin, OTR/L  [phone] 10/19/2018, 3:13 PM  [place] [address] [place], [place], [address] Phone: [phone]   Fax:  [phone]  Name: Mary Wise MRN: [id number] Date of Birth: [birth year]

## 2018-10-19 NOTE — Patient Instructions (Signed)

## 2018-10-21 ENCOUNTER — Telehealth (HOSPITAL_COMMUNITY): Payer: Self-pay | Admitting: Occupational Therapy

## 2018-10-21 ENCOUNTER — Ambulatory Visit (HOSPITAL_COMMUNITY): Payer: Medicare Other | Admitting: Occupational Therapy

## 2018-10-21 NOTE — Telephone Encounter (Signed)
She cx due to not feeling well, has a stuffy nose and can not breathe well. She plans to come in next week

## 2018-10-22 ENCOUNTER — Telehealth (HOSPITAL_COMMUNITY): Payer: Self-pay | Admitting: Occupational Therapy

## 2018-10-22 NOTE — Telephone Encounter (Signed)
Called and left message for pt regarding 2 week clinic closure for COVID-19 precautions. Instructed pt on HEP and asked to return call if has questions.    Guadelupe Sabin, OTR/L  [phone] 10/22/2018

## 2018-10-26 ENCOUNTER — Ambulatory Visit (HOSPITAL_COMMUNITY): Payer: Medicare Other | Admitting: Occupational Therapy

## 2018-10-26 DIAGNOSIS — Z1211 Encounter for screening for malignant neoplasm of colon: Secondary | ICD-10-CM | POA: Diagnosis not present

## 2018-10-26 DIAGNOSIS — Z1212 Encounter for screening for malignant neoplasm of rectum: Secondary | ICD-10-CM | POA: Diagnosis not present

## 2018-10-26 LAB — COLOGUARD

## 2018-10-28 ENCOUNTER — Ambulatory Visit (HOSPITAL_COMMUNITY): Payer: Medicare Other | Admitting: Occupational Therapy

## 2018-10-29 NOTE — Telephone Encounter (Signed)
Received the results of Cologuard screening.    Screening noted negative. Letter sent.   A negative result indicates a low likelihood of colorectal cancer is present. Following a negative Cologuard result, the American Cancer Society recommends a Cologuard re-screening interval of 3 years.

## 2018-11-01 ENCOUNTER — Encounter (HOSPITAL_COMMUNITY): Payer: Medicare Other

## 2018-11-02 ENCOUNTER — Encounter (HOSPITAL_COMMUNITY): Payer: Medicare Other

## 2018-11-04 ENCOUNTER — Telehealth (HOSPITAL_COMMUNITY): Payer: Self-pay | Admitting: Occupational Therapy

## 2018-11-04 ENCOUNTER — Encounter (HOSPITAL_COMMUNITY): Payer: Self-pay | Admitting: Occupational Therapy

## 2018-11-04 NOTE — Telephone Encounter (Signed)
Patient was contacted today regarding the temporary reduction of OP rehab services due to concerns for community transmission of Covid-19.   Left message asking pt to return call to discuss current functioning and potential for telehealth visits, as well as updated HEP.   Guadelupe Sabin, OTR/L  [phone] 11/04/2018

## 2018-11-04 NOTE — Telephone Encounter (Signed)
Pt returned call and would appreciated updated HEP-mailed. No access to internet for telehealth services, no email. Weekly follow-up call to discuss HEP.   The patient was offered and declined the continuation in their POC by using methods such as an e-visit, virtual check-in, or telehealth visit.  Outpatient Rehabilitation Services will follow up with this client when we are able to safely resume care at the [facility] clinic in person.  Patient is aware we can be reached by telephone during limited business hours in the meantime.    Guadelupe Sabin, OTR/L  [phone] 11/04/2018

## 2018-11-04 NOTE — Therapy (Signed)
[place], [place], [address] Phone: [phone]   Fax:  [phone]  Patient Details  Name: Mary Wise MRN: [id number] Date of Birth: 07/29/1951 Referring Provider:  No ref. provider found  Encounter Date: 11/04/2018  Mailed shoulder stretching HEP to pt at her home address. Will call to follow up next week.   Guadelupe Sabin, OTR/L  [phone] 11/04/2018, 3:18 PM  [place] [address] [place], [place], [address] Phone: [phone]   Fax:  [phone]

## 2018-11-09 ENCOUNTER — Encounter (HOSPITAL_COMMUNITY): Payer: Medicare Other

## 2018-11-10 ENCOUNTER — Encounter: Payer: Self-pay | Admitting: General Practice

## 2018-11-10 NOTE — Progress Notes (Signed)
[place] Team contacted patient to assess for food insecurity and other psychosocial needs during current COVID19 pandemic.  Has been staying in, "trying to keep myself amused w various things", has requested refills on medications (Xanax and pain patches).  Has called in request for refills to pharmacy that is reaching out to MD.  Has new PCP, "I have only met her once."  Family member is running errands for patient.    Patient/family expressed no needs at this time.  Support Team member encouraged patient to call if changes occur or they have any other questions/concerns.   Beverely Pace, [place]

## 2018-11-11 ENCOUNTER — Encounter (HOSPITAL_COMMUNITY): Payer: Medicare Other | Admitting: Occupational Therapy

## 2018-11-11 ENCOUNTER — Telehealth (HOSPITAL_COMMUNITY): Payer: Self-pay | Admitting: Occupational Therapy

## 2018-11-11 NOTE — Telephone Encounter (Signed)
Called pt to follow up on HEP mailed last week. Left message requesting return call if have not received or if has questions.    Guadelupe Sabin, OTR/L  [phone] 11/11/2018

## 2018-11-15 ENCOUNTER — Other Ambulatory Visit: Payer: Self-pay | Admitting: *Deleted

## 2018-11-15 ENCOUNTER — Encounter (HOSPITAL_COMMUNITY): Payer: Medicare Other

## 2018-11-15 DIAGNOSIS — F419 Anxiety disorder, unspecified: Secondary | ICD-10-CM

## 2018-11-15 MED ORDER — ALPRAZOLAM 1 MG PO TABS: ORAL_TABLET | ORAL | 1 refills | Status: DC

## 2018-11-15 NOTE — Telephone Encounter (Signed)
Received call from patient.   Requested refill on Xanax.   Ok to refill??  Last office visit/ refill 10/11/2018.

## 2018-11-16 ENCOUNTER — Encounter (HOSPITAL_COMMUNITY): Payer: Medicare Other

## 2018-11-19 ENCOUNTER — Telehealth (HOSPITAL_COMMUNITY): Payer: Self-pay | Admitting: Occupational Therapy

## 2018-11-19 NOTE — Telephone Encounter (Signed)
Weekly follow-up call with COVID-19 clinic closure: spoke with pt who did receive her new HEP, reports she has tried some exercise but not all due to gout flare up. Reports everything she has tried is going well, no questions at this time.   OT will follow up next week.    Mary Wise, OTR/L  [phone] 11/19/2018

## 2018-11-22 ENCOUNTER — Encounter (HOSPITAL_COMMUNITY): Payer: Medicare Other

## 2018-11-23 ENCOUNTER — Encounter (HOSPITAL_COMMUNITY): Payer: Medicare Other

## 2018-11-26 ENCOUNTER — Telehealth (HOSPITAL_COMMUNITY): Payer: Self-pay

## 2018-11-26 NOTE — Telephone Encounter (Signed)
Weekly follow-up call with COVID-19 clinic closure: No answer. Unable to leave a message. Number continued to ring without a voicemail pick up.   Ailene Ravel, OTR/L,CBIS  [phone]

## 2018-12-03 ENCOUNTER — Telehealth (HOSPITAL_COMMUNITY): Payer: Self-pay | Admitting: Occupational Therapy

## 2018-12-03 NOTE — Telephone Encounter (Signed)
Attempted to contact pt to follow up on HEP. No answer, left message on pt's voicemail.    Guadelupe Sabin, OTR/L  [phone] 12/03/2018

## 2018-12-10 ENCOUNTER — Telehealth (HOSPITAL_COMMUNITY): Payer: Self-pay | Admitting: Occupational Therapy

## 2018-12-10 NOTE — Telephone Encounter (Signed)
Left message for pt to follow up on shoulder functioning and offer in-clinic appointment. Asked pt to return call to schedule or discharge.    Guadelupe Sabin, OTR/L  [phone] 12/10/2018

## 2018-12-13 ENCOUNTER — Other Ambulatory Visit: Payer: Self-pay | Admitting: Hematology

## 2018-12-13 ENCOUNTER — Other Ambulatory Visit: Payer: Self-pay | Admitting: *Deleted

## 2018-12-13 DIAGNOSIS — Z171 Estrogen receptor negative status [ER-]: Secondary | ICD-10-CM

## 2018-12-13 DIAGNOSIS — C50412 Malignant neoplasm of upper-outer quadrant of left female breast: Secondary | ICD-10-CM

## 2018-12-13 MED ORDER — ZOLPIDEM TARTRATE 10 MG PO TABS: 10.0000 mg | ORAL_TABLET | Freq: Every evening | ORAL | 3 refills | Status: DC | PRN

## 2018-12-13 NOTE — Telephone Encounter (Signed)
Received call from patient.   Requested refill on Ambien.   Ok to refill??  Last office visit 3/9/220.  Last refill 05/24/2018, #3 refills.

## 2018-12-14 NOTE — Progress Notes (Signed)
CLINIC:  Survivorship   Date of service: 12/20/2018   REASON FOR VISIT:  Routine follow-up post-treatment for a recent history of breast cancer.  BRIEF ONCOLOGIC HISTORY:  Oncology History   Cancer Staging Malignant neoplasm of upper-outer quadrant of left breast in female, estrogen receptor negative ([place]) Staging form: Breast, AJCC 8th Edition - Pathologic stage from 08/19/2017: Stage IIA (pT2, pN0(sn), cM0, G3, ER: Negative, PR: Negative, HER2: Negative) - Signed by Ardath Sax, MD on 09/01/2017       Malignant neoplasm of upper-outer quadrant of left breast in female, estrogen receptor negative ([place])   06/16/2017 Initial Diagnosis    Malignant neoplasm of upper-outer quadrant of left breast in female: Initially presented with left deltoid mass which was consistent with lipoma.  During examination, a mass in the left breast was appreciated by palpation.    06/16/2017 Mammogram    BL Diagnostic Study: Irregular hyperdense mass in the upper outer quadrant of the left breast measuring 3.6 cm without radiographically detectable lymphadenopathy.  Normal appearance of the right breast.    06/16/2017 Breast US    3.2 cm irregular mass located at 2:30 o'clock, no pathological lymphadenopathy.    06/23/2017 Pathology Results    Ultrasound-guided biopsy: Positive for invasive ductal carcinoma, grade 3, ER 0%, PR 0%, HER-2 negative by [facility]    07/31/2017 Surgery    Partial left mastectomy with sentinel lymph node biopsy --Pathology: Invasive ductal carcinoma, measuring 4.5 cm, grade 3.  All margins negative, but the anterior margin less than 0.1 cm.  No surrounding DCIS.  0/5 lymph nodes positive for malignancy    10/08/2017 PET scan    IMPRESSION: 1. No evidence breast cancer metastasis on FDG PET-CT scan. 2. Postsurgical change in the LEFT lateral breast and LEFT axilla.    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 Uncertain Significance The following genes were evaluated for sequence changes and exonic deletions/duplications: ALK, APC, ATM, AXIN2, BAP1, BARD1, BLM, BMPR1A, BRCA1, BRCA2, BRIP1, CASR, CDC73, CDH1, CDK4, CDKN1B, CDKN1C, CDKN2A (p14ARF), CDKN2A (p16INK4a), CEBPA, CHEK2, CTNNA1, DICER1, DIS3L2, EPCAM*, FH, FLCN, GATA2, GPC3, GREM1*, HRAS, KIT, MAX, MEN1, MET, MLH1, MSH2, MSH3, MSH6, MUTYH, NBN, NF1, NF2, PALB2, PDGFRA, PHOX2B*, PMS2, POLD1, POLE, POT1, PRKAR1A, PTCH1, PTEN, RAD50, RAD51C, RAD51D, RB1, RECQL4, RET, RUNX1, SDHAF2, SDHB, SDHC, SDHD, SMAD4, SMARCA4, SMARCB1, SMARCE1, STK11, SUFU, TERC, TERT, TMEM127, TP53, TSC1, TSC2, VHL, WRN*, WT1 The following genes were evaluated for sequence changes only: EGFR*, HOXB13*, MITF*, NTHL1*, SDHA Results are negative unless otherwise indicated    05/27/2018 - 06/23/2018 Radiation Therapy    Radiation treatment dates:   05/27/2018 - 06/23/2018  Site/dose:    1. Left Breast / 40.05 Gy in 15 fractions 2. Left Breast Boost / 10 Gy in 5 fractions - Total dose of 50.05 Gy  Beams/energy:    1. 3D / 6X Photon 2. Isodose / 10X, 6X Photon  Narrative: The patient tolerated radiation treatment relatively well.  She experienced fatigue and some expected skin irritation with mild erythema and soreness to her breast/axilla. She is applying Radiaplex twice daily as directed. She also mentioned bilateral hand cramping with bruising. She was advised to discuss this with her PCP as it is not related to her radiation treatment.  Survivorship    Per Cira Rue, NP      INTERVAL HISTORY:  Mary Wise presents to the [facility] today for our initial meeting to review her survivorship care plan detailing her treatment course for breast cancer,  as well as monitoring long-term side effects of that treatment, education regarding health maintenance, screening, and overall wellness and health promotion.     Overall, Mary Wise reports feeling quite well since completing her radiation therapy approximately 6 months ago.  She feels "wonderful." She is staying at home and social distancing given COVID19 situation. Denies fever, chills, cough, dyspnea, chest pain. Appetite and weight are normal. Denies change in bowel habits. Still using laxative PRN. Denies abdominal pain. Denies bleeding. Her anxiety is stable, takes 0.5-1 tab xanax PRN. She has family support. She is finding spiritual fulfillment in books and TV since she has not gone to church yet. Her knee pain is stable, has occasional leg swelling due to arthritis. Denies new bone pain. She gets exercises mailed to her since she can't go to therapy for left shoulder. Has normal ROM. Peripheral neuropathy is gone in fingers, and nearly resolved in toes. Sometimes feels a lump in her neck but can't feel it lately. She denies changes in her breasts.     REVIEW OF SYSTEMS:  Review of Systems  Constitutional: Negative for appetite change, chills, fatigue, fever and unexpected weight change.  HENT:   Negative for sore throat.        Occasional right neck lump, not felt lately   Respiratory: Negative for chest tightness, cough and shortness of breath.   Cardiovascular: Negative for chest pain and leg swelling.  Gastrointestinal: Negative for abdominal pain, blood in stool, constipation, diarrhea, nausea and vomiting.  Genitourinary: Negative for vaginal bleeding.   Musculoskeletal: Positive for arthralgias.       Arthritis, knee pain at baseline   Neurological: Positive for headaches and numbness.       Chronic headaches; peripheral neuropathy - improved   Hematological: Positive for adenopathy.       Occasional lump in right neck, not felt lately   Psychiatric/Behavioral: Negative for  depression. The patient is nervous/anxious.        Anxiety is well controlled    Breast: Denies any new nodularity, masses, tenderness, nipple changes, or nipple discharge. Denies pain or lymphedema     ONCOLOGY TREATMENT TEAM:  1. Surgeon:  Dr. Constance Haw  2. Medical Oncologist: Dr. Burr Medico 3. Radiation Oncologist: Dr. Isidore Moos    PAST MEDICAL/SURGICAL HISTORY:  Past Medical History:  Diagnosis Date   Anxiety    Asthma    Cancer ([place])    left breast cancer   Family history of cancer    Genetic testing 02/01/2018   Multi-Cancer panel (83 genes) @ [facility] - No pathogenic mutations detected   Hemorrhoids    History of radiation therapy 05/27/18- 06/23/18   Left Breast 40.05 Gy in 15 fractions, left breast boost 10 Gy in 5 fractions.    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   Past Surgical History:  Procedure Laterality Date   ABDOMINAL HYSTERECTOMY     APPENDECTOMY     CESAREAN SECTION     CHOLECYSTECTOMY     ERCP     IR FLUORO GUIDE PORT INSERTION RIGHT  10/05/2017   IR REMOVAL TUN ACCESS W/ PORT W/O FL MOD SED  05/13/2018  IR US GUIDE VASC ACCESS RIGHT  10/05/2017   LIPOMA EXCISION Left 03/16/2018   left shoulder   LIVER SURGERY     PARTIAL MASTECTOMY WITH AXILLARY SENTINEL LYMPH NODE BIOPSY Left 07/31/2017   Procedure: LEFT PARTIAL MASTECTOMY WITH AXILLARY SENTINEL LYMPH NODE BIOPSY;  Surgeon: Virl Cagey, MD;  Location: [facility];  Service: General;  Laterality: Left;     ALLERGIES:  Allergies  Allergen Reactions   Onion Other (See Comments)    REACTION: results in coma state   Penicillins Hives, Shortness Of Breath and Swelling    Throat swelling Has patient had a PCN reaction causing immediate rash, facial/tongue/throat swelling, SOB or lightheadedness with hypotension: Yes Has patient had a PCN reaction causing severe rash involving mucus membranes or skin necrosis: No Has  patient had a PCN reaction that required hospitalization: Yes Has patient had a PCN reaction occurring within the last 10 years: No If all of the above answers are "NO", then may proceed with Cephalosporin use.    Sulfa Antibiotics Hives and Shortness Of Breath   Yellow Dye     REACTION: #5 and #90 "will kill me"   Tape Swelling    Pt states she had tape and Tegaderm used on her in [place]. This caused swelling to her face and neck. Wounds and bleeding from area tape/tegaderm was used.    Clindamycin/Lincomycin Hives and Nausea And Vomiting     CURRENT MEDICATIONS:  Outpatient Encounter Medications as of 12/20/2018  Medication Sig   albuterol (PROVENTIL) (2.5 MG/3ML) 0.083% nebulizer solution Take 3 mLs (2.5 mg total) by nebulization every 6 (six) hours as needed for wheezing or shortness of breath.   albuterol (VENTOLIN HFA) 108 (90 Base) MCG/ACT inhaler INHALE 2 PUFFS BY MOUTH EVERY 6 HOURS AS NEEDED FOR WHEEZING OR SHORTNESS OF BREATH   ALPRAZolam (XANAX) 1 MG tablet TAKE 1 TABLET BY MOUTH TWICE DAILY AS NEEDED FOR ANXIETY   gabapentin (NEURONTIN) 400 MG capsule Take 1 capsule three times a day   Hypromellose (ARTIFICIAL TEARS OP) Apply to eye 2 (two) times daily. Doesn't do drops in eyes does as a wash to flush eyes out   lidocaine (LIDODERM) 5 % Place 0.5-1 patches onto the skin daily as needed (pain). Remove & Discard patch within 12 hours or as directed by MD   SUMAtriptan (IMITREX) 100 MG tablet Take 1 tablet 122m  By mouth at onset of migraine, If symptoms persist 2 hours later then take additional dose.Maximum 2 per 24 hours   zolpidem (AMBIEN) 10 MG tablet Take 1 tablet (10 mg total) by mouth at bedtime as needed for sleep.   ibuprofen (ADVIL,MOTRIN) 800 MG tablet Take 800 mg by mouth every 8 (eight) hours as needed.   Menthol, Topical Analgesic, (BIOFREEZE EX) Apply 1 application topically as needed (pain).   No facility-administered encounter medications on  file as of 12/20/2018.      ONCOLOGIC FAMILY HISTORY:  Family History  Problem Relation Age of Onset   Cancer Mother 713      unk. primary; deceased 856  Cancer Father 873      brain cancer; deceased 8106  Ovarian cancer Sister 641      deceased 628  Diabetes Brother    Breast cancer Maternal Grandmother        dx 428s deceased 836  Cancer Other        "female ca"   Diabetes Sister  Diabetes Sister    Diabetes Sister    Cancer Paternal Uncle        brain cancer    Cancer Paternal Grandmother        "female ca"   Cancer Paternal Grandfather        lung cancer   Cancer Paternal Uncle        brain tumor    Cancer Paternal Uncle        brain tumor    Stomach cancer Maternal Aunt 87       deceased 27   Cancer Paternal Aunt        ink. 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 Uncertain Significance The following genes were evaluated for sequence changes and exonic deletions/duplications: ALK, APC, ATM, AXIN2, BAP1, BARD1, BLM, BMPR1A, BRCA1, BRCA2, BRIP1, CASR, CDC73, CDH1, CDK4, CDKN1B, CDKN1C, CDKN2A (p14ARF), CDKN2A (p16INK4a), CEBPA, CHEK2, CTNNA1, DICER1, DIS3L2, EPCAM*, FH, FLCN, GATA2, GPC3, GREM1*, HRAS, KIT, MAX, MEN1, MET, MLH1, MSH2, MSH3, MSH6, MUTYH, NBN, NF1, NF2, PALB2, PDGFRA, PHOX2B*, PMS2, POLD1, POLE, POT1, PRKAR1A, PTCH1, PTEN, RAD50, RAD51C, RAD51D, RB1, RECQL4, RET, RUNX1, SDHAF2, SDHB, SDHC, SDHD, SMAD4, SMARCA4, SMARCB1, SMARCE1, STK11, SUFU, TERC, TERT, TMEM127, TP53, TSC1, TSC2, VHL, WRN*, WT1 The following genes were evaluated for sequence changes only: EGFR*, HOXB13*, MITF*, NTHL1*, SDHA Results are negative unless otherwise indicated  SOCIAL HISTORY:  Mary Wise is single and lives alone.  She has extensive family history of cancer. She has no  updates to this history. She lost 4 family members this month, 3 from cancer, 1 from PE.  She denies any current or history of tobacco, alcohol, or illicit drug use.     PHYSICAL EXAMINATION:  Vital Signs:   Vitals:   12/20/18 1343  BP: 135/88  Pulse: 94  Resp: 18  Temp: 98.9 F (37.2 C)  SpO2: 98%   Filed Weights   12/20/18 1343  Weight: 202 lb 12.8 oz (92 kg)   General: Well-nourished, well-appearing female in no acute distress.   HEENT: Head is normocephalic.  Conjunctivae clear without exudate.  Sclerae anicteric.  Lymph: No palpable cervical, supraclavicular, or axillary lymphadenopathy  Cardiovascular: Regular rate and rhythm.  Respiratory: Clear to auscultation bilaterally.normal respiratory effort.  GI: Abdomen soft, non-tender, non-distended. Bowel sounds normal  Neuro: No focal deficits. Steady gait.  Psych: Mood and affect normal and appropriate for situation.  Extremities: No edema. MSK: Full range of motion in bilateral upper extremities Skin: Warm and dry. No rash Breast exam: s/p left partial mastectomy, incisions are well healed. skin mildly hyperpigmented to left axilla and breast.  Small non-tender subQ nodule noted to left low axilla/chest wall, corresponding with fluid collection on [place] PAC removed   LABORATORY DATA:   CBC Latest Ref Rng & Units 12/20/2018 09/20/2018 05/24/2018  WBC 4.0 - 10.5 K/uL 7.3 5.2 6.4  Hemoglobin 12.0 - 15.0 g/dL 13.0 13.6 12.9  Hematocrit 36.0 - 46.0 % 39.9 41.6 40.0  Platelets 150 - 400 K/uL 309 301 306   CMP Latest Ref Rng & Units 12/20/2018 09/20/2018 05/24/2018  Glucose 70 - 99 mg/dL 88 83 88  BUN 8 - 23 mg/dL '13 14 10  ' Creatinine 0.44 - 1.00 mg/dL 0.82 0.87 0.78  Sodium 135 - 145 mmol/L 142 140 142  Potassium 3.5 - 5.1 mmol/L 3.4(L) 3.7 3.7  Chloride 98 - 111 mmol/L 105 102 105  CO2 22 - 32 mmol/L '26 29 26  ' Calcium 8.9 - 10.3 mg/dL 8.9 9.6 9.7  Total Protein 6.5 - 8.1 g/dL 7.4 7.7 7.8  Total Bilirubin 0.3 - 1.2 mg/dL  0.2(L) 0.4 0.3  Alkaline Phos 38 - 126 U/L 119 126 123  AST 15 - 41 U/L '21 25 18  ' ALT 0 - 44 U/L '21 24 17    ' DIAGNOSTIC IMAGING:  None for this visit.      ASSESSMENT AND PLAN:  Ms.. Riches is a pleasant 68 y.o. female with Stage IIA left breast invasive ductal carcinoma, ER-/PR-/HER2-, diagnosed in 06/2017, treated with partial mastectomy, adjuvant chemotherapy and radiation therapy.  She presents to the Survivorship Clinic for our initial meeting and routine follow-up post-completion of treatment for breast cancer.    1. Stage II left breast cancer:  Ms. Kinn has recovered well from definitive treatment for breast cancer. She has no specific concerns today. Labs normal. Breast exam shows subcutaneous nodule that has been evaluated on mammo/US and is felt to represent a fluid collection, otherwise unremarkable. No concern for recurrence in the breast. Will continue breast cancer surveillance. She will follow-up with her medical oncologist, Burr Medico in 03/2019 with history and physical exam per surveillance protocol. Given her ER/PR negative tumor, she is not a candidate for anti-estrogen therapy. Her last bilateral mammogram in 10/2018 was negative for evidence of malignancy in either breast. She had a left breast US in 10/2018 which was also negative. She is due for repeat mammogram annually in 10/2019.  Breast density is category b. Today, a comprehensive survivorship care plan and treatment summary was reviewed with the patient today detailing her breast cancer diagnosis, treatment course, potential late/long-term effects of treatment, appropriate follow-up care with recommendations for the future, and patient education resources.  A copy of this summary, along with a letter will be sent to the patients primary care provider via mail/fax/In Basket message after todays visit.    2. Bone health:  Given Ms. Hollingworth's age/history of breast cancer and previous chemotherapy, she is at risk for bone  demineralization.  She has not had DEXA screening yet; her PCP has ordered it and is not scheduled yet. I encouraged her to schedule this when COVID19 situation allows. In the meantime, she was encouraged to increase her consumption of foods rich in calcium, as well as increase her weight-bearing activities.  She was given education on specific activities to promote bone health.  3. Cancer screening:  Due to Ms. Douthitt's history and her age, she should receive screening for skin cancers, colon cancer, and gynecologic cancers.  She completed cologuard testing in 10/2018 which was negative. I reviewed a colonoscopy is the gold standard for colon cancer screening. She has not had one, nor was she aware. I offered to refer her to GI to discuss this, however she prefers to speak with her PCP about it. The information and recommendations are listed on the patient's comprehensive care plan/treatment summary and were reviewed in detail with the patient.    #. Health maintenance and wellness promotion: Ms. Sanjuan was encouraged to consume 5-7 servings of fruits and vegetables per day. We reviewed the "Nutrition Rainbow" handout, as well as the handout "Take Control of Your Health and Reduce Your Cancer Risk" from the [place].  She was also encouraged to engage in moderate to vigorous exercise for 30 minutes  per day most days of the week. We discussed the [employer] fitness program, which is designed for cancer survivors to help them become more physically fit after cancer treatments.  She was instructed to limit her alcohol consumption and continue to abstain from tobacco use.     #. Support services/counseling: It is not uncommon for this period of the patient's cancer care trajectory to be one of many emotions and stressors.  We discussed an opportunity for her to participate in the next session of [facility] ("Finding Your New Normal") support group series designed for patients after they have completed  treatment.   Ms. Marton was encouraged to take advantage of our many other support services programs, support groups, and/or counseling in coping with her new life as a cancer survivor after completing anti-cancer treatment.  She was offered support today through active listening and expressive supportive counseling.  She was given information regarding our available services and encouraged to contact me with any questions or for help enrolling in any of our support group/programs.    Dispo:   -Return to cancer center 03/2019 -Mammogram due in 10/2019 -Schedule your DEXA scan  -Follow up with surgery as needed  -She is welcome to return back to the Survivorship Clinic at any time; no additional follow-up needed at this time.  -Consider referral back to survivorship as a long-term survivor for continued surveillance  A total of (40) minutes of face-to-face time was spent with this patient with greater than 50% of that time in counseling and care-coordination.   Cira Rue, NP  Survivorship Program [facility] [phone]   Note: PRIMARY CARE PROVIDER [place], [place] [phone]

## 2018-12-15 ENCOUNTER — Telehealth: Payer: Self-pay | Admitting: Hematology

## 2018-12-15 NOTE — Telephone Encounter (Signed)
LB off in the AM 5/18. Appointment moved to PM. Left message for patient. Schedule mailed.

## 2018-12-17 NOTE — Telephone Encounter (Signed)
Patient returned call yesterday and left message confirming she has received appointment time change for 5/18 and will be here.

## 2018-12-20 ENCOUNTER — Inpatient Hospital Stay: Payer: Medicare Other | Attending: Nurse Practitioner | Admitting: Nurse Practitioner

## 2018-12-20 ENCOUNTER — Inpatient Hospital Stay: Payer: Medicare Other

## 2018-12-20 ENCOUNTER — Other Ambulatory Visit: Payer: Self-pay

## 2018-12-20 VITALS — BP 135/88 | HR 94 | Temp 98.9°F | Resp 18 | Ht 68.0 in | Wt 202.8 lb

## 2018-12-20 DIAGNOSIS — Z171 Estrogen receptor negative status [ER-]: Secondary | ICD-10-CM

## 2018-12-20 DIAGNOSIS — M1710 Unilateral primary osteoarthritis, unspecified knee: Secondary | ICD-10-CM | POA: Insufficient documentation

## 2018-12-20 DIAGNOSIS — C50412 Malignant neoplasm of upper-outer quadrant of left female breast: Secondary | ICD-10-CM | POA: Insufficient documentation

## 2018-12-20 DIAGNOSIS — F419 Anxiety disorder, unspecified: Secondary | ICD-10-CM | POA: Insufficient documentation

## 2018-12-20 DIAGNOSIS — G620 Drug-induced polyneuropathy: Secondary | ICD-10-CM | POA: Diagnosis not present

## 2018-12-20 LAB — CMP (CANCER CENTER ONLY)
ALT: 21 U/L (ref 0–44)
AST: 21 U/L (ref 15–41)
Albumin: 3.7 g/dL (ref 3.5–5.0)
Alkaline Phosphatase: 119 U/L (ref 38–126)
Anion gap: 11 (ref 5–15)
BUN: 13 mg/dL (ref 8–23)
CO2: 26 mmol/L (ref 22–32)
Calcium: 8.9 mg/dL (ref 8.9–10.3)
Chloride: 105 mmol/L (ref 98–111)
Creatinine: 0.82 mg/dL (ref 0.44–1.00)
GFR, Est AFR Am: >60 mL/min (ref >60)
GFR, Estimated: >60 mL/min (ref >60)
Glucose, Bld: 88 mg/dL (ref 70–99)
Potassium: 3.4 mmol/L — ABNORMAL LOW (ref 3.5–5.1)
Sodium: 142 mmol/L (ref 135–145)
Total Bilirubin: 0.2 mg/dL — ABNORMAL LOW (ref 0.3–1.2)
Total Protein: 7.4 g/dL (ref 6.5–8.1)

## 2018-12-20 LAB — CBC WITH DIFFERENTIAL/PLATELET
Abs Immature Granulocytes: 0.02 10*3/uL (ref 0.00–0.07)
Basophils Absolute: 0.1 10*3/uL (ref 0.0–0.1)
Basophils Relative: 1 %
Eosinophils Absolute: 0.2 10*3/uL (ref 0.0–0.5)
Eosinophils Relative: 3 %
HCT: 39.9 % (ref 36.0–46.0)
Hemoglobin: 13 g/dL (ref 12.0–15.0)
Immature Granulocytes: 0 %
Lymphocytes Relative: 33 %
Lymphs Abs: 2.4 10*3/uL (ref 0.7–4.0)
MCH: 29.3 pg (ref 26.0–34.0)
MCHC: 32.6 g/dL (ref 30.0–36.0)
MCV: 90.1 fL (ref 80.0–100.0)
Monocytes Absolute: 0.6 10*3/uL (ref 0.1–1.0)
Monocytes Relative: 8 %
Neutro Abs: 4.1 10*3/uL (ref 1.7–7.7)
Neutrophils Relative %: 55 %
Platelets: 309 10*3/uL (ref 150–400)
RBC: 4.43 MIL/uL (ref 3.87–5.11)
RDW: 12.8 % (ref 11.5–15.5)
WBC: 7.3 10*3/uL (ref 4.0–10.5)
nRBC: 0 % (ref 0.0–0.2)

## 2018-12-21 ENCOUNTER — Encounter: Payer: Self-pay | Admitting: Nurse Practitioner

## 2018-12-21 ENCOUNTER — Encounter: Payer: Medicare Other | Admitting: Adult Health

## 2018-12-21 ENCOUNTER — Telehealth: Payer: Self-pay | Admitting: Nurse Practitioner

## 2018-12-21 NOTE — Telephone Encounter (Signed)
No los per 5/18. °

## 2019-01-10 ENCOUNTER — Ambulatory Visit (INDEPENDENT_AMBULATORY_CARE_PROVIDER_SITE_OTHER): Payer: Medicare Other | Admitting: Family Medicine

## 2019-01-10 ENCOUNTER — Other Ambulatory Visit: Payer: Self-pay

## 2019-01-10 ENCOUNTER — Encounter: Payer: Self-pay | Admitting: Family Medicine

## 2019-01-10 VITALS — BP 120/80 | HR 112 | Temp 98.1°F | Resp 15 | Ht 68.0 in | Wt 201.5 lb

## 2019-01-10 DIAGNOSIS — E785 Hyperlipidemia, unspecified: Secondary | ICD-10-CM | POA: Diagnosis not present

## 2019-01-10 DIAGNOSIS — I10 Essential (primary) hypertension: Secondary | ICD-10-CM | POA: Diagnosis not present

## 2019-01-10 DIAGNOSIS — F419 Anxiety disorder, unspecified: Secondary | ICD-10-CM | POA: Diagnosis not present

## 2019-01-10 DIAGNOSIS — C50412 Malignant neoplasm of upper-outer quadrant of left female breast: Secondary | ICD-10-CM | POA: Diagnosis not present

## 2019-01-10 DIAGNOSIS — Z1211 Encounter for screening for malignant neoplasm of colon: Secondary | ICD-10-CM | POA: Diagnosis not present

## 2019-01-10 DIAGNOSIS — E538 Deficiency of other specified B group vitamins: Secondary | ICD-10-CM

## 2019-01-10 DIAGNOSIS — R252 Cramp and spasm: Secondary | ICD-10-CM

## 2019-01-10 DIAGNOSIS — Z171 Estrogen receptor negative status [ER-]: Secondary | ICD-10-CM

## 2019-01-10 MED ORDER — CYCLOBENZAPRINE HCL 10 MG PO TABS: 10.0000 mg | ORAL_TABLET | Freq: Three times a day (TID) | ORAL | 0 refills | Status: DC | PRN

## 2019-01-10 MED ORDER — SUMATRIPTAN SUCCINATE 100 MG PO TABS: ORAL_TABLET | ORAL | 5 refills | Status: DC

## 2019-01-10 MED ORDER — ALBUTEROL SULFATE (2.5 MG/3ML) 0.083% IN NEBU: 2.5000 mg | INHALATION_SOLUTION | Freq: Four times a day (QID) | RESPIRATORY_TRACT | 3 refills | Status: DC | PRN

## 2019-01-10 MED ORDER — ALBUTEROL SULFATE HFA 108 (90 BASE) MCG/ACT IN AERS: INHALATION_SPRAY | RESPIRATORY_TRACT | 3 refills | Status: DC

## 2019-01-10 MED ORDER — ZOLPIDEM TARTRATE 10 MG PO TABS: 10.0000 mg | ORAL_TABLET | Freq: Every evening | ORAL | 3 refills | Status: DC | PRN

## 2019-01-10 NOTE — Patient Instructions (Addendum)
REferral for colonoscopy  Try the flexeril 5mg  at bedtime  Schedule your bone density  F/U 4 months

## 2019-01-10 NOTE — Progress Notes (Signed)
   Subjective:    Patient ID: Mary Wise, female    DOB: 1951-02-01, 68 y.o.   MRN: [id number]  Patient presents for Hyperlipidemia  Here to follow-up chronic medical problems and medications. Last visit was in March.   Generalized anxiety/chronic insomnia she is continued on Ambien and alprazolam as needed. Recently she noticed ambien pill was changed, since then she feels like she is awake in an nightmare, also only sleeps 3-4 hours a night and wakes with a headcahe She picked it up on May 11th  But continued to take despite these symptoms   Hypertension she monitors this at home she does have whitecoat hypertension  Hyperlipidemia- taking fish oil   B12 def- taking 1000mg    Severe muscle cramps for years, all over, shoulders, legs, arms.has used flexeril in the past request a script for this  Still followed by oncology , advised they recommend she have colonoscopy due to cancer history instead of using cologuard  Review Of Systems:  GEN- denies fatigue, fever, weight loss,weakness, recent illness HEENT- denies eye drainage, change in vision, nasal discharge, CVS- denies chest pain, palpitations RESP- denies SOB, cough, wheeze ABD- denies N/V, change in stools, abd pain GU- denies dysuria, hematuria, dribbling, incontinence MSK- + joint pain,+ muscle aches, injury Neuro- denies headache, dizziness, syncope, seizure activity       Objective:    BP 120/80   Pulse (!) 112   Temp 98.1 F (36.7 C) (Oral)   Resp 15   Ht 5\' 8"  (1.727 m)   Wt 201 lb 8 oz (91.4 kg)   SpO2 96%   BMI 30.64 kg/m  GEN- NAD, alert and oriented x3 HEENT- PERRL, EOMI, non injected sclera, pink conjunctiva, MMM, oropharynx clear Neck- Supple, no thyromegaly CVS- RR, HR 96 , no murmur RESP-CTAB ABD-NABS,soft,NT,ND PSYCH- A LITTLE anxious, not depressed,no SI  EXT- No edema, Fair ROM upper and lower ext, normal tone, strength in tact  Pulses- Radial, DP- 2+        Assessment & Plan:       Problem List Items Addressed This Visit      Unprioritized   Anxiety    Continue current meds      HTN, goal below 140/90 - Primary    Controlled no changes       Relevant Orders   CBC with Differential/Platelet (Completed)   Comprehensive metabolic panel (Completed)   Malignant neoplasm of upper-outer quadrant of left breast in female, estrogen receptor negative (HCC)   Relevant Medications   albuterol (VENTOLIN HFA) 108 (90 Base) MCG/ACT inhaler   Other Relevant Orders   Ambulatory referral to Gastroenterology   Muscle cramps    Prn flexeril given Keep hydrated Check Mag level      Relevant Orders   CK (Completed)   Magnesium (Completed)    Other Visit Diagnoses    B12 deficiency       recheck levels   Relevant Orders   Vitamin B12 (Completed)   Mild hyperlipidemia       check lipids, continue fish oil , dietary changes    Relevant Orders   Comprehensive metabolic panel (Completed)   Lipid panel (Completed)   Colon cancer screening       Relevant Orders   Ambulatory referral to Gastroenterology      Note: This dictation was prepared with Dragon dictation along with smaller phrase technology. Any transcriptional errors that result from this process are unintentional.

## 2019-01-11 ENCOUNTER — Encounter: Payer: Self-pay | Admitting: Family Medicine

## 2019-01-11 LAB — COMPREHENSIVE METABOLIC PANEL
AG Ratio: 1.4 (calc) (ref 1.0–2.5)
ALT: 61 U/L — ABNORMAL HIGH (ref 6–29)
AST: 58 U/L — ABNORMAL HIGH (ref 10–35)
Albumin: 4.4 g/dL (ref 3.6–5.1)
Alkaline phosphatase (APISO): 124 U/L (ref 37–153)
BUN: 11 mg/dL (ref 7–25)
CO2: 29 mmol/L (ref 20–32)
Calcium: 9.6 mg/dL (ref 8.6–10.4)
Chloride: 101 mmol/L (ref 98–110)
Creat: 0.81 mg/dL (ref 0.50–0.99)
Globulin: 3.1 g/dL (calc) (ref 1.9–3.7)
Glucose, Bld: 100 mg/dL — ABNORMAL HIGH (ref 65–99)
Potassium: 3.8 mmol/L (ref 3.5–5.3)
Sodium: 140 mmol/L (ref 135–146)
Total Bilirubin: 0.3 mg/dL (ref 0.2–1.2)
Total Protein: 7.5 g/dL (ref 6.1–8.1)

## 2019-01-11 LAB — CBC WITH DIFFERENTIAL/PLATELET
Absolute Monocytes: 543 cells/uL (ref 200–950)
Basophils Absolute: 92 cells/uL (ref 0–200)
Basophils Relative: 1.5 %
Eosinophils Absolute: 250 cells/uL (ref 15–500)
Eosinophils Relative: 4.1 %
HCT: 43.4 % (ref 35.0–45.0)
Hemoglobin: 14.5 g/dL (ref 11.7–15.5)
Lymphs Abs: 1970 cells/uL (ref 850–3900)
MCH: 29.5 pg (ref 27.0–33.0)
MCHC: 33.4 g/dL (ref 32.0–36.0)
MCV: 88.4 fL (ref 80.0–100.0)
MPV: 10.3 fL (ref 7.5–12.5)
Monocytes Relative: 8.9 %
Neutro Abs: 3245 cells/uL (ref 1500–7800)
Neutrophils Relative %: 53.2 %
Platelets: 340 10*3/uL (ref 140–400)
RBC: 4.91 10*6/uL (ref 3.80–5.10)
RDW: 13.2 % (ref 11.0–15.0)
Total Lymphocyte: 32.3 %
WBC: 6.1 10*3/uL (ref 3.8–10.8)

## 2019-01-11 LAB — VITAMIN B12: Vitamin B-12: 856 pg/mL (ref 200–1100)

## 2019-01-11 LAB — LIPID PANEL
Cholesterol: 237 mg/dL — ABNORMAL HIGH (ref <200)
HDL: 63 mg/dL (ref >=50)
LDL Cholesterol (Calc): 142 mg/dL (calc) — ABNORMAL HIGH
Non-HDL Cholesterol (Calc): 174 mg/dL (calc) — ABNORMAL HIGH (ref <130)
Total CHOL/HDL Ratio: 3.8 (calc) (ref <5.0)
Triglycerides: 184 mg/dL — ABNORMAL HIGH (ref <150)

## 2019-01-11 LAB — MAGNESIUM: Magnesium: 2.1 mg/dL (ref 1.5–2.5)

## 2019-01-11 LAB — CK: Total CK: 80 U/L (ref 29–143)

## 2019-01-11 NOTE — Assessment & Plan Note (Signed)
Controlled no changes 

## 2019-01-11 NOTE — Assessment & Plan Note (Signed)
Prn flexeril given Keep hydrated Check Mag level

## 2019-01-11 NOTE — Assessment & Plan Note (Signed)
Continue current meds 

## 2019-01-13 ENCOUNTER — Other Ambulatory Visit: Payer: Self-pay | Admitting: *Deleted

## 2019-01-13 DIAGNOSIS — M25562 Pain in left knee: Secondary | ICD-10-CM

## 2019-01-13 DIAGNOSIS — M25561 Pain in right knee: Secondary | ICD-10-CM

## 2019-01-13 MED ORDER — LIDOCAINE 5 % EX PTCH: 0.5000 | MEDICATED_PATCH | Freq: Every day | CUTANEOUS | 2 refills | Status: DC | PRN

## 2019-01-14 ENCOUNTER — Encounter (HOSPITAL_COMMUNITY): Payer: Self-pay | Admitting: Occupational Therapy

## 2019-01-14 NOTE — Therapy (Signed)
[place], [place], [address] Phone: [phone]   Fax:  [phone]  Patient Details  Name: Mary Wise MRN: [id number] Date of Birth: March 19, 1951 Referring Provider:  No ref. provider found  Encounter Date: 01/14/2019  OCCUPATIONAL THERAPY DISCHARGE SUMMARY  Visits from Start of Care: 2  Current functional level related to goals / functional outcomes: Unknown. Pt has not returned phone calls or called to schedule since clinic reopened after covid clinic closure. Pt will be discharged at this time.    Remaining deficits: unknown   Education / Equipment: HEP Plan: Patient agrees to discharge.  Patient goals were not met. Patient is being discharged due to not returning since the last visit.  ?????       Guadelupe Sabin, OTR/L  [phone] 01/14/2019, 11:04 AM  [place], [place], [address] Phone: [phone]   Fax:  [phone]

## 2019-01-19 ENCOUNTER — Encounter: Payer: Self-pay | Admitting: *Deleted

## 2019-01-19 DIAGNOSIS — R7989 Other specified abnormal findings of blood chemistry: Secondary | ICD-10-CM

## 2019-01-24 ENCOUNTER — Encounter (INDEPENDENT_AMBULATORY_CARE_PROVIDER_SITE_OTHER): Payer: Self-pay | Admitting: *Deleted

## 2019-02-02 ENCOUNTER — Other Ambulatory Visit: Payer: Self-pay

## 2019-02-02 ENCOUNTER — Encounter: Payer: Self-pay | Admitting: Family Medicine

## 2019-02-02 ENCOUNTER — Ambulatory Visit (HOSPITAL_COMMUNITY)
Admission: RE | Admit: 2019-02-02 | Discharge: 2019-02-02 | Disposition: A | Discharge status: Home / Self Care | Payer: Medicare Other | Source: Ambulatory Visit | Attending: Family Medicine | Admitting: Family Medicine

## 2019-02-02 DIAGNOSIS — M8588 Other specified disorders of bone density and structure, other site: Secondary | ICD-10-CM | POA: Diagnosis not present

## 2019-02-02 DIAGNOSIS — M8580 Other specified disorders of bone density and structure, unspecified site: Secondary | ICD-10-CM | POA: Insufficient documentation

## 2019-02-02 DIAGNOSIS — Z780 Asymptomatic menopausal state: Secondary | ICD-10-CM | POA: Diagnosis not present

## 2019-02-02 DIAGNOSIS — N951 Menopausal and female climacteric states: Secondary | ICD-10-CM | POA: Diagnosis not present

## 2019-02-03 ENCOUNTER — Encounter: Payer: Self-pay | Admitting: *Deleted

## 2019-02-05 IMAGING — MR MR SHOULDER*L* WO/W CM
4 of 8 series · 19 of 40 positions shown · IV contrast (multihance)
Comparison: 06/29/2017 MRI

CLINICAL DATA: Lipoma upper extremity.  History of lymphoma.

EXAM:
MRI OF THE LEFT SHOULDER WITHOUT AND WITH CONTRAST
TECHNIQUE: Multiplanar, multisequence MR imaging of the LEFT shoulder was
performed before and after the administration of intravenous
contrast.
CONTRAST:  17mL MULTIHANCE GADOBENATE DIMEGLUMINE 529 MG/ML IV SOLN

[Series 4: T1 · coronal · 4.0mm · 0.41mm/px · 3 of 26 slices shown (1 of 2)]
[im 1/26]
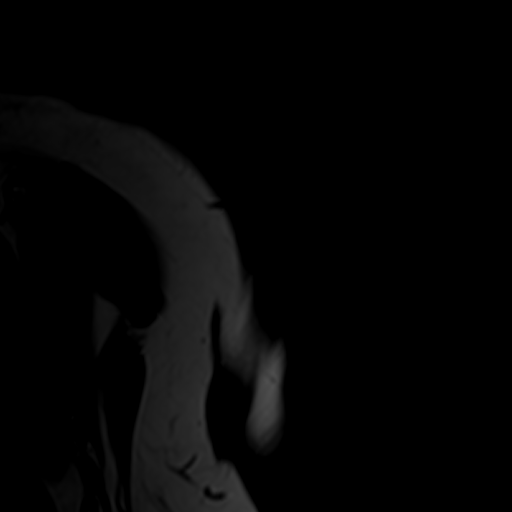
[im 13/26]
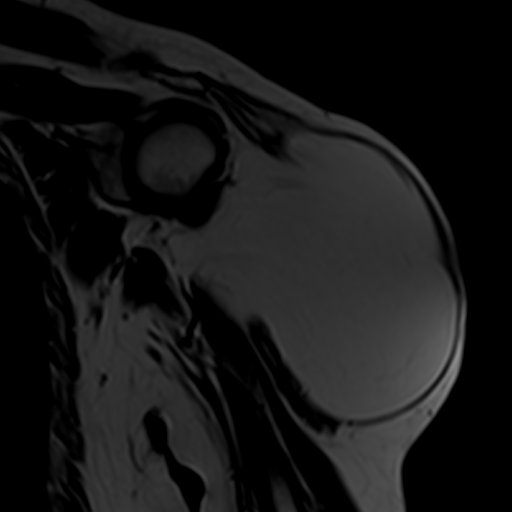
[im 26/26]
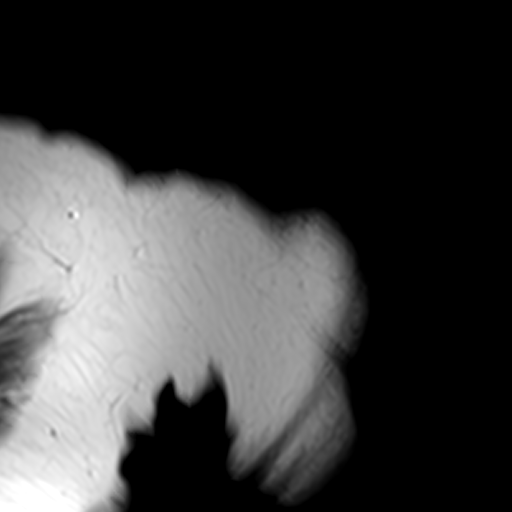

[Series 6: T1 · axial · 4.0mm · 0.39mm/px · z∈[-52,+120]mm · 6 of 40 slices shown (2 of 2)]
[im 1/40]
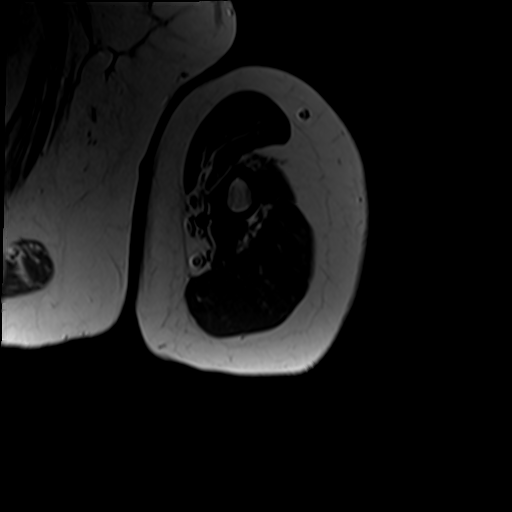
[im 8/40]
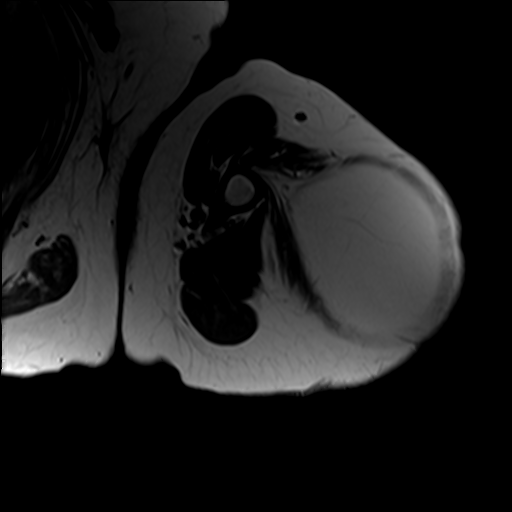
[im 16/40]
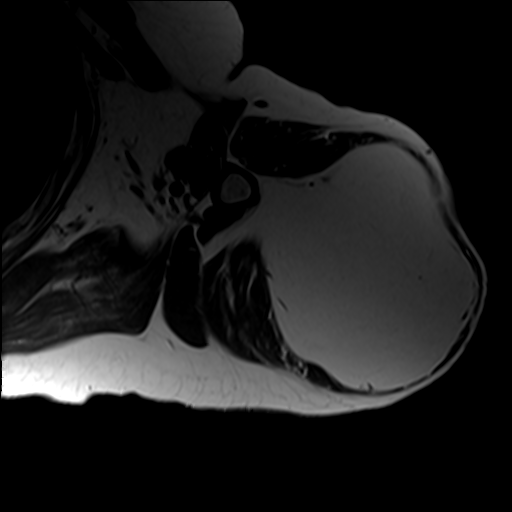
[im 24/40]
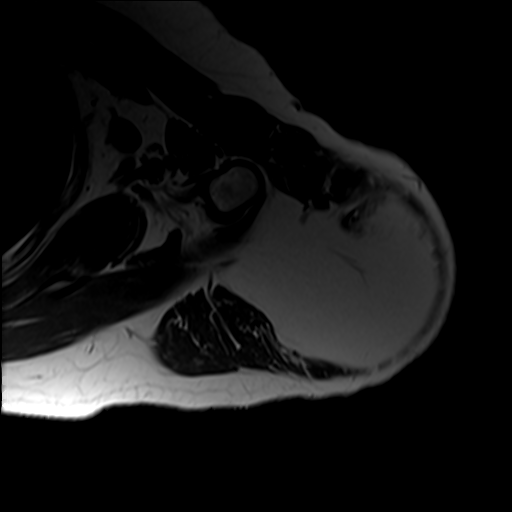
[im 32/40]
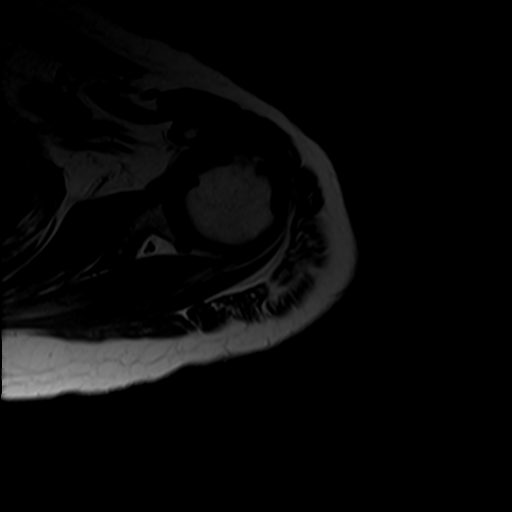
[im 40/40]
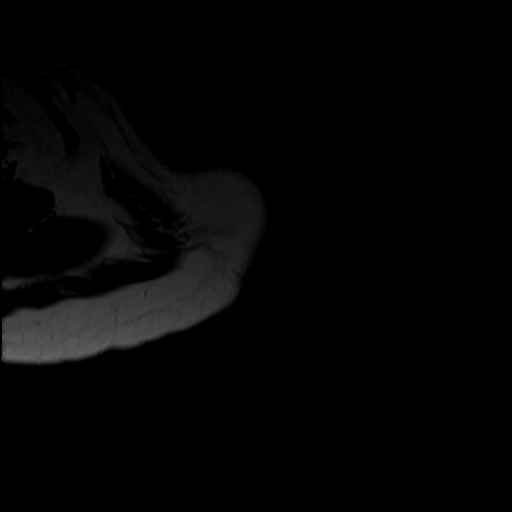

[Series 9: T1 fat-sat · axial · 4.0mm · 0.39mm/px · z∈[-50,+122]mm · 6 of 40 slices shown (1 of 2)]
[im 1/40]
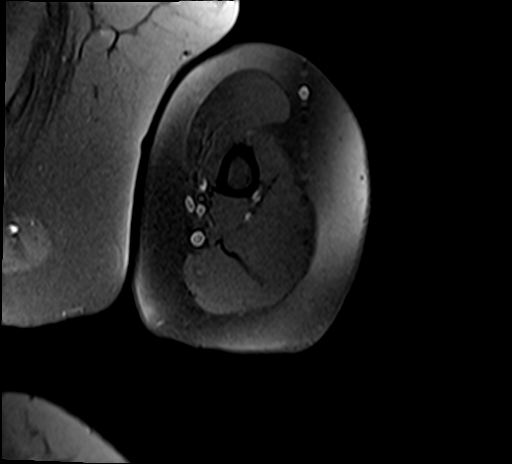
[im 8/40]
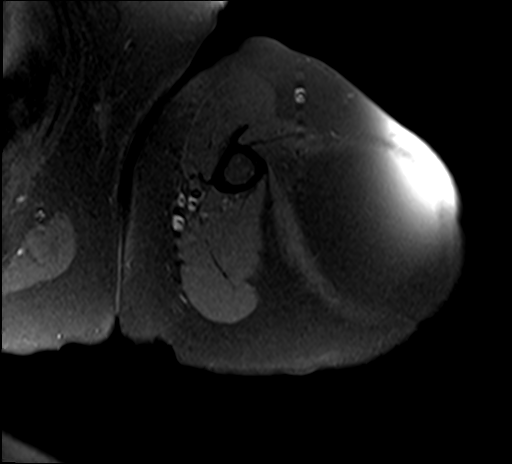
[im 16/40]
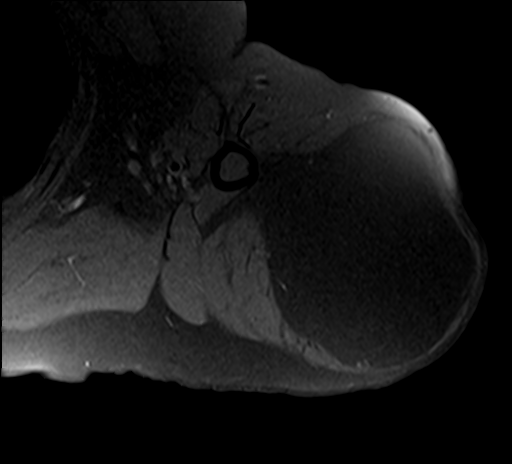
[im 24/40]
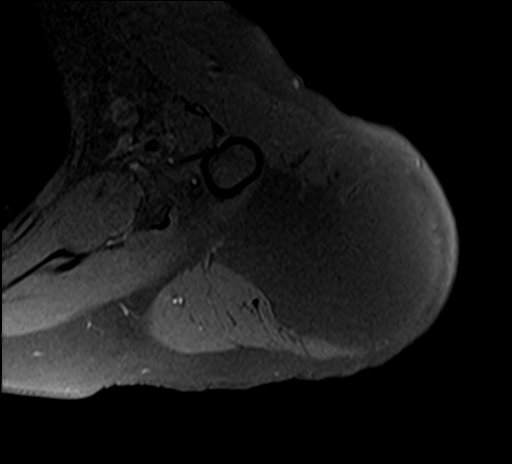
[im 32/40]
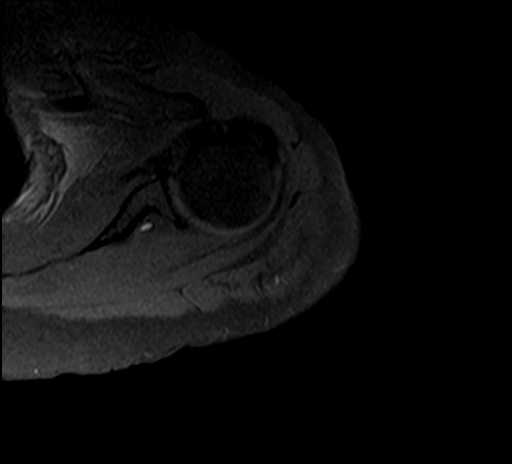
[im 40/40]
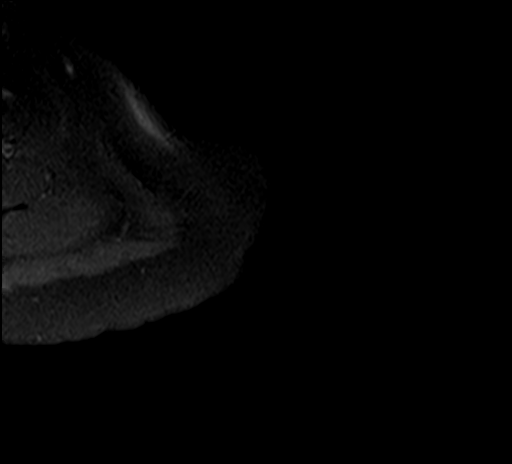

[Series 10: T1 fat-sat · coronal · 4.0mm · 0.42mm/px · 4 of 26 slices shown (2 of 2)]
[im 1/26]
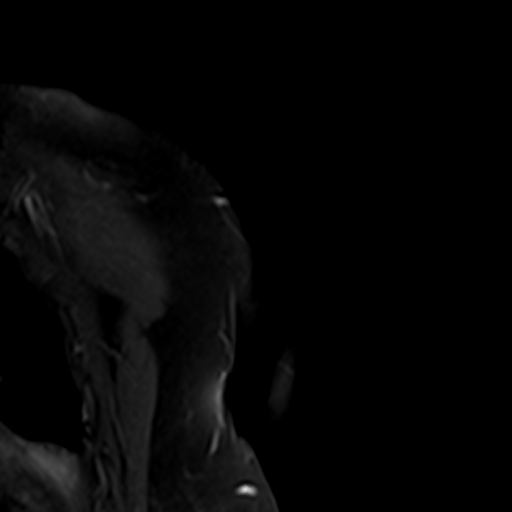
[im 9/26]
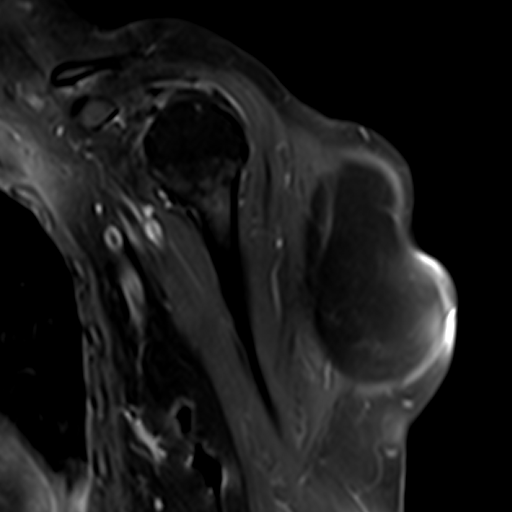
[im 17/26]
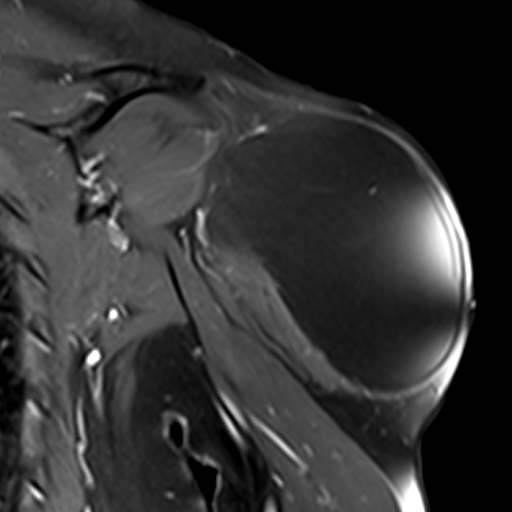
[im 26/26]
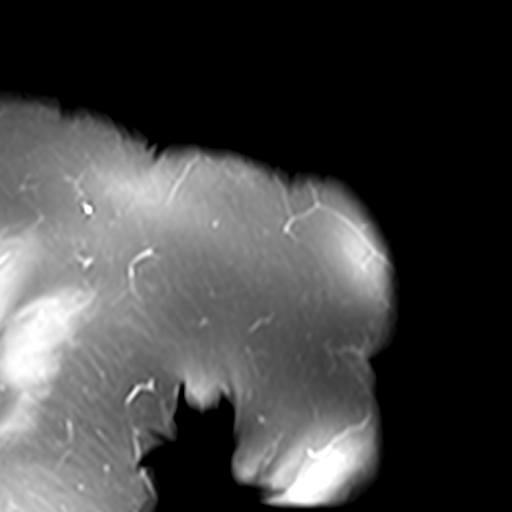

[19 of 40 positions shown; findings below may reference images not displayed]

FINDINGS: There is an approximately 10.8 x 7.9 x 12.3 cm fatty mass centered
within the left deltoid muscle with no enhancing septations or
nodules. This mass is well encapsulated. Previously this was noted
to measure approximately 9.3 x 9.4 by 12.4 cm.

Rotator cuff: Partial articular surface tear of the supraspinatus.
The remainder appear largely intact.

Muscles:  No muscle atrophy.

Biceps long head:  Intact and seated within the biceps groove.

Acromioclavicular Joint: Mild arthropathy of the acromioclavicular
joint. Type II curved acromion. Trace subacromial bursal fluid.

Glenohumeral Joint: No joint effusion.

Labrum: Grossly intact, but evaluation is limited by lack of
intraarticular fluid.

Bones: Subcortical cystic change of the humeral head. No aggressive
osseous abnormality.

Other: No axillary lymphadenopathy identified.
IMPRESSION: 1. A well encapsulated stable 10.8 x 7.9 x 12.3 cm fatty mass
without worrisome features is identified within the left deltoid
muscle. Findings are in keeping with a simple intramuscular lipoma.
No thickened septations or nodular components identified to suggest
a liposarcoma.
2. AC joint osteoarthritis.
3. Partial articular surface tear of the supraspinatus with the
remainder of the rotator cuff tendons largely intact.

## 2019-02-10 ENCOUNTER — Other Ambulatory Visit: Payer: Self-pay | Admitting: Family Medicine

## 2019-02-10 DIAGNOSIS — F419 Anxiety disorder, unspecified: Secondary | ICD-10-CM

## 2019-02-10 NOTE — Telephone Encounter (Signed)
Pt is requesting refill on Xanax   LOV: 01/10/19  LRF:   11/15/18

## 2019-02-17 ENCOUNTER — Other Ambulatory Visit: Payer: Medicare Other

## 2019-02-17 ENCOUNTER — Other Ambulatory Visit: Payer: Self-pay

## 2019-02-17 DIAGNOSIS — R7989 Other specified abnormal findings of blood chemistry: Secondary | ICD-10-CM

## 2019-02-17 DIAGNOSIS — R945 Abnormal results of liver function studies: Secondary | ICD-10-CM | POA: Diagnosis not present

## 2019-02-18 LAB — COMPLETE METABOLIC PANEL WITH GFR
AG Ratio: 1.4 (calc) (ref 1.0–2.5)
ALT: 18 U/L (ref 6–29)
AST: 19 U/L (ref 10–35)
Albumin: 4.2 g/dL (ref 3.6–5.1)
Alkaline phosphatase (APISO): 115 U/L (ref 37–153)
BUN: 11 mg/dL (ref 7–25)
CO2: 26 mmol/L (ref 20–32)
Calcium: 9.4 mg/dL (ref 8.6–10.4)
Chloride: 103 mmol/L (ref 98–110)
Creat: 0.7 mg/dL (ref 0.50–0.99)
GFR, Est African American: 104 mL/min/{1.73_m2} (ref >=60)
GFR, Est Non African American: 90 mL/min/{1.73_m2} (ref >=60)
Globulin: 3.1 g/dL (calc) (ref 1.9–3.7)
Glucose, Bld: 87 mg/dL (ref 65–99)
Potassium: 4 mmol/L (ref 3.5–5.3)
Sodium: 140 mmol/L (ref 135–146)
Total Bilirubin: 0.4 mg/dL (ref 0.2–1.2)
Total Protein: 7.3 g/dL (ref 6.1–8.1)

## 2019-02-24 ENCOUNTER — Other Ambulatory Visit: Payer: Self-pay | Admitting: *Deleted

## 2019-02-24 MED ORDER — ATORVASTATIN CALCIUM 20 MG PO TABS: 20.0000 mg | ORAL_TABLET | Freq: Every day | ORAL | 3 refills | Status: DC

## 2019-03-04 ENCOUNTER — Telehealth: Payer: Self-pay | Admitting: *Deleted

## 2019-03-04 NOTE — Telephone Encounter (Signed)
Received call from patient.   Reports that she has been taking Lipitor as directed, but has noted increased myalgias. States that she will not continue medication.  MD to be made aware.

## 2019-03-04 NOTE — Telephone Encounter (Signed)
Noted pt declines taking

## 2019-03-14 ENCOUNTER — Telehealth: Payer: Self-pay | Admitting: Hematology

## 2019-03-14 NOTE — Telephone Encounter (Signed)
Called per 8/09 sch message - pt rather come in office instead of virtual visit.

## 2019-03-15 ENCOUNTER — Other Ambulatory Visit: Payer: Self-pay | Admitting: Family Medicine

## 2019-03-15 DIAGNOSIS — F419 Anxiety disorder, unspecified: Secondary | ICD-10-CM

## 2019-03-15 NOTE — Telephone Encounter (Signed)
Ok to refill??  Last office visit 01/10/2019.  Last refill 02/11/2019.

## 2019-03-18 NOTE — Progress Notes (Signed)
[place]   Telephone:([phone]) [phone] [phone]) [phone]   Clinic Follow up Note   Patient Care Team: [facility], Modena Nunnery, MD as PCP - General (Family Medicine) Truitt Merle, MD as Consulting Physician (Hematology) Eppie Gibson, MD as Attending Physician (Radiation Oncology) Alla Feeling, NP as Nurse Practitioner (Nurse Practitioner) Virl Cagey, MD as Consulting Physician (General Surgery)  Date of Service:  03/21/2019  CHIEF COMPLAINT: F/u triple negative left breast cancer   SUMMARY OF ONCOLOGIC HISTORY: Oncology History Overview Note  Cancer Staging Malignant neoplasm of upper-outer quadrant of left breast in female, estrogen receptor negative ([place]) Staging form: Breast, AJCC 8th Edition - Pathologic stage from 08/19/2017: Stage IIA (pT2, pN0(sn), cM0, G3, ER: Negative, PR: Negative, HER2: Negative) - Signed by Ardath Sax, MD on 09/01/2017     Malignant neoplasm of upper-outer quadrant of left breast in female, estrogen receptor negative ([place])  06/16/2017 Initial Diagnosis   Malignant neoplasm of upper-outer quadrant of left breast in female: Initially presented with left deltoid mass which was consistent with lipoma.  During examination, a mass in the left breast was appreciated by palpation.   06/16/2017 Mammogram   BL Diagnostic Study: Irregular hyperdense mass in the upper outer quadrant of the left breast measuring 3.6 cm without radiographically detectable lymphadenopathy.  Normal appearance of the right breast.   06/16/2017 Breast US   3.2 cm irregular mass located at 2:30 o'clock, no pathological lymphadenopathy.   06/23/2017 Pathology Results   Ultrasound-guided biopsy: Positive for invasive ductal carcinoma, grade 3, ER 0%, PR 0%, HER-2 negative by [facility]   07/31/2017 Surgery   Partial left mastectomy with sentinel lymph node biopsy --Pathology: Invasive ductal carcinoma, measuring 4.5 cm, grade 3.  All margins negative, but the anterior  margin less than 0.1 cm.  No surrounding DCIS.  0/5 lymph nodes positive for malignancy   10/08/2017 PET scan   IMPRESSION: 1. No evidence breast cancer metastasis on FDG PET-CT scan. 2. Postsurgical change in the LEFT lateral breast and LEFT axilla.   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 Significance The following genes were evaluated for sequence changes and exonic deletions/duplications: ALK, APC, ATM, AXIN2, BAP1, BARD1, BLM, BMPR1A, BRCA1, BRCA2, BRIP1, CASR, CDC73, CDH1, CDK4, CDKN1B, CDKN1C, CDKN2A (p14ARF), CDKN2A (p16INK4a), CEBPA, CHEK2, CTNNA1, DICER1, DIS3L2, EPCAM*, FH, FLCN, GATA2, GPC3, GREM1*, HRAS, KIT, MAX, MEN1, MET, MLH1, MSH2, MSH3, MSH6, MUTYH, NBN, NF1, NF2, PALB2, PDGFRA, PHOX2B*, PMS2, POLD1, POLE, POT1, PRKAR1A, PTCH1, PTEN, RAD50, RAD51C, RAD51D, RB1, RECQL4, RET, RUNX1, SDHAF2, SDHB, SDHC, SDHD, SMAD4, SMARCA4, SMARCB1, SMARCE1, STK11, SUFU, TERC, TERT, TMEM127, TP53, TSC1, TSC2, VHL, WRN*, WT1 The following genes were evaluated for sequence changes only: EGFR*, HOXB13*, MITF*, NTHL1*, SDHA Results are negative unless otherwise indicated   05/27/2018 - 06/23/2018 Radiation Therapy   Radiation treatment dates:   05/27/2018 - 06/23/2018  Site/dose:    1. Left Breast / 40.05 Gy in 15 fractions 2. Left Breast Boost / 10 Gy in 5 fractions - Total dose of 50.05 Gy  Beams/energy:    1. 3D / 6X Photon 2. Isodose / 10X, 6X Photon  Narrative: The patient tolerated radiation treatment relatively well.  She experienced fatigue and some expected skin irritation with mild erythema and soreness to her breast/axilla. She is applying  Radiaplex twice daily as directed. She also mentioned  bilateral hand cramping with bruising. She was advised to discuss this with her PCP as it is not related to her radiation treatment.    Survivorship   Per Cira Rue, NP       CURRENT THERAPY:  Surveillance   INTERVAL HISTORY:  Mary Wise is here for a follow up left breast cancer. She was last seen by me 6 months ago. She saw NP Lacie 3 months ago for survivorship clinic. She presents to the clinic alone. She notes her PCP put her medication for her HLD but had allergic reaction with significant hives. She went to ED for treatment. She stopped taking it. She denies nay other new medications.  She notes she is otherwise doing well. She does have hip and leg pain. She feels she needs hip and knee replacements soon. She does not have cane or walker to ambulate with. She would like to see Dr. Percell Miller Orthopedic surgeon. She notes she sees her PCP every 3 months.     REVIEW OF SYSTEMS:   Constitutional: Denies fevers, chills or abnormal weight loss Eyes: Denies blurriness of vision Ears, nose, mouth, throat, and face: Denies mucositis or sore throat Respiratory: Denies cough, dyspnea or wheezes Cardiovascular: Denies palpitation, chest discomfort or lower extremity swelling Gastrointestinal:  Denies nausea, heartburn or change in bowel habits Skin: Denies abnormal skin rashes MSK: (+) Significant hip and leg/knee pain, chronic  Lymphatics: Denies new lymphadenopathy or easy bruising Neurological:Denies numbness, tingling or new weaknesses Behavioral/Psych: Mood is stable, no new changes  All other systems were reviewed with the patient and are negative.  MEDICAL HISTORY:  Past Medical History:  Diagnosis Date   Anxiety    Asthma    Cancer ([place])    left breast cancer   Family history of cancer    Genetic testing 02/01/2018   Multi-Cancer panel (83 genes) @ [facility] - No pathogenic mutations detected   Hemorrhoids    History of radiation therapy 05/27/18- 06/23/18   Left  Breast 40.05 Gy in 15 fractions, left breast boost 10 Gy in 5 fractions.    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    SURGICAL HISTORY: Past Surgical History:  Procedure Laterality Date   ABDOMINAL HYSTERECTOMY     APPENDECTOMY     CESAREAN SECTION     CHOLECYSTECTOMY     ERCP     IR FLUORO GUIDE PORT INSERTION RIGHT  10/05/2017   IR REMOVAL TUN ACCESS W/ PORT W/O FL MOD SED  05/13/2018   IR US GUIDE VASC ACCESS RIGHT  10/05/2017   LIPOMA EXCISION Left 03/16/2018   left shoulder   LIVER SURGERY     PARTIAL MASTECTOMY WITH AXILLARY SENTINEL LYMPH NODE BIOPSY Left 07/31/2017   Procedure: LEFT PARTIAL MASTECTOMY WITH AXILLARY SENTINEL LYMPH NODE BIOPSY;  Surgeon: Virl Cagey, MD;  Location: [facility];  Service: General;  Laterality: Left;    I have reviewed the social history and family history with the patient and they are unchanged from previous note.  ALLERGIES:  is allergic to onion; penicillins; sulfa antibiotics; yellow dye; tape; and clindamycin/lincomycin.  MEDICATIONS:  Current Outpatient Medications  Medication Sig Dispense Refill   albuterol (PROVENTIL) (2.5 MG/3ML) 0.083% nebulizer solution Take 3 mLs (2.5 mg total) by nebulization every 6 (six) hours as needed for wheezing  or shortness of breath. 90 mL 3   albuterol (VENTOLIN HFA) 108 (90 Base) MCG/ACT inhaler INHALE 2 PUFFS BY MOUTH EVERY 6 HOURS AS NEEDED FOR WHEEZING OR SHORTNESS OF BREATH 18 g 3   ALPRAZolam (XANAX) 1 MG tablet Take 1 tablet by mouth twice daily as needed for anxiety 60 tablet 2   cyclobenzaprine (FLEXERIL) 10 MG tablet Take 1 tablet (10 mg total) by mouth 3 (three) times daily as needed for muscle spasms. 30 tablet 0   gabapentin (NEURONTIN) 400 MG capsule Take 1 capsule three times a day 90 capsule 6   Hypromellose (ARTIFICIAL TEARS OP) Apply to eye 2 (two) times daily. Doesn't do drops in eyes does  as a wash to flush eyes out     lidocaine (LIDODERM) 5 % Place 0.5-1 patches onto the skin daily as needed (pain). Remove & Discard patch within 12 hours or as directed by MD 30 patch 2   SUMAtriptan (IMITREX) 100 MG tablet Take 1 tablet 111m  By mouth at onset of migraine, If symptoms persist 2 hours later then take additional dose.Maximum 2 per 24 hours 9 tablet 5   zolpidem (AMBIEN) 10 MG tablet Take 1 tablet (10 mg total) by mouth at bedtime as needed for sleep. 30 tablet 3   No current facility-administered medications for this visit.     PHYSICAL EXAMINATION: ECOG PERFORMANCE STATUS: 2 - Symptomatic, <50% confined to bed  Vitals:   03/21/19 1043  BP: (!) 146/94  Pulse: 87  Resp: 17  Temp: 98.3 F (36.8 C)  SpO2: 97%   Filed Weights   03/21/19 1043  Weight: 201 lb 3.2 oz (91.3 kg)    GENERAL:alert, no distress and comfortable SKIN: skin color, texture, turgor are normal, no rashes or significant lesions EYES: normal, Conjunctiva are pink and non-injected, sclera clear  NECK: supple, thyroid normal size, non-tender, without nodularity LYMPH:  no palpable lymphadenopathy in the cervical, axillary  LUNGS: clear to auscultation and percussion with normal breathing effort HEART: regular rate & rhythm and no murmurs and no lower extremity edema ABDOMEN:abdomen soft, non-tender and normal bowel sounds (+) right mid abdominal tenderness next to umbilicus, no palpable mass or organomegaly.  Musculoskeletal:no cyanosis of digits and no clubbing  (+) Palpable Upper spinal cyst  NEURO: alert & oriented x 3 with fluent speech, no focal motor/sensory deficits BREAST: S/p left lumpectomy: Surgical incision healed well (+) benign nodule of left breast incision. No palpable adenopathy bilaterally. Breast exam benign.   LABORATORY DATA:  I have reviewed the data as listed CBC Latest Ref Rng & Units 03/21/2019 01/10/2019 12/20/2018  WBC 4.0 - 10.5 K/uL 5.8 6.1 7.3  Hemoglobin 12.0 - 15.0  g/dL 12.9 14.5 13.0  Hematocrit 36.0 - 46.0 % 39.0 43.4 39.9  Platelets 150 - 400 K/uL 291 340 309     CMP Latest Ref Rng & Units 03/21/2019 02/17/2019 01/10/2019  Glucose 70 - 99 mg/dL 90 87 100(H)  BUN 8 - 23 mg/dL _0 Creatinine 0.44 - 1.00 mg/dL 0.77 0.70 0.81  Sodium 135 - 145 mmol/L 140 140 140  Potassium 3.5 - 5.1 mmol/L 3.6 4.0 3.8  Chloride 98 - 111 mmol/L 105 103 101  CO2 22 - 32 mmol/L _1 Calcium 8.9 - 10.3 mg/dL 9.0 9.4 9.6  Total Protein 6.5 - 8.1 g/dL 7.4 7.3 7.5  Total Bilirubin 0.3 - 1.2 mg/dL 0.3 0.4 0.3  Alkaline Phos 38 - 126 U/L 110 - -  AST 15 - 41 U/L 20 19 58(H)  ALT 0 - 44 U/L 21 18 61(H)      RADIOGRAPHIC STUDIES: I have personally reviewed the radiological images as listed and agreed with the findings in the report. No results found.   ASSESSMENT & PLAN:  ARMINTA GAMM is a 68 y.o. female with    1. 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 Proliferation marker Ki67 at 90% -She was diagnosed in 06/2017. She is s/p partial left mastectomy, Adjuvant chemo AC-T and adjuvant radiation.  -She is clinically doing well. Lab reviewed, her CBC and CMP are within normal limits. Her physical exam and her 10/2018 mammogram were unremarkable except small ceroma at left breast incision. There is no clinical concern for recurrence. -I discussed she is almost 2 years since diagnosis. The longer she goes without recurrence the lower her risk of recurrence will be.  -Virtual visit in 4 months and f/u with NP Lacie in 8 months, she does lab every 3 months with her PCP   2. Left breast and axillary pain  -secondary to surgery  -Her Mammogram and [place] from 10/2018 were benign. There are scar tissue present.  -overall improved, she only has soreness occasionally now.   3. Anxiety, insomnia  -She has chronic insomnia, tried trazodone, melatonin, and benadryl in the past for Insomnia -She  takes Xanax PRN but has found herself using it more often since her Breast Cancer diagnosis.  -I dicussed seeing a psychiatrist or counselor. She declined. -She remains on Ambien and Xanax, which help her anxiety and insomnia -not mentioned today, Mood likely stable.   4. Large left upper arm lipoma, s/p surgery on 03/15/18 with Dr. Sherlynn Stalls   5. Genetic testing was negative  6. Arthralgia and back pain -She states she has been on hydrocodone occasionally (she gets from ED visits) and oxycodone as needed in the past  -I do not plan to refill her narcotics in the future  -Controlled with Lidocaine patches as needed. -She remains to have significant hip and leg/knee pain. She would like to see orthopedic surgeon Dr. Percell Miller as she is considering hip and knee replacements. I advised her to ask her PCP for a referral.  -I highly recommend she ambulate with cane or walker. She has not had a fall.   7. Peripheral neuropathy, G2  -secondary to previous chemo  -Controlled on Gabapentin 21m in AM, 307min afternoon and 30067mn the evening, overall controlled   Plan -Virtual visit in 4 months  -Lab and f/u with NP Lacie in 8 months  -she will have lab with PCP every 3 months    No problem-specific Assessment & Plan notes found for this encounter.   No orders of the defined types were placed in this encounter.  All questions were answered. The patient knows to call the clinic with any problems, questions or concerns. No barriers to learning was detected. I spent 15 minutes counseling the patient face to face. The total time spent in the appointment was 20 minutes and more than 50% was on counseling and review of test results     YanTruitt MerleD 03/21/2019   I, AmoJoslyn Devonm acting as scribe for YanTruitt MerleD.   I have reviewed the above documentation for accuracy and completeness, and I agree with the above.

## 2019-03-21 ENCOUNTER — Encounter: Payer: Self-pay | Admitting: Hematology

## 2019-03-21 ENCOUNTER — Telehealth: Payer: Self-pay | Admitting: Hematology

## 2019-03-21 ENCOUNTER — Inpatient Hospital Stay (HOSPITAL_BASED_OUTPATIENT_CLINIC_OR_DEPARTMENT_OTHER): Payer: Medicare Other | Admitting: Hematology

## 2019-03-21 ENCOUNTER — Inpatient Hospital Stay: Payer: Medicare Other | Attending: Nurse Practitioner

## 2019-03-21 ENCOUNTER — Other Ambulatory Visit: Payer: Self-pay

## 2019-03-21 VITALS — BP 146/94 | HR 87 | Temp 98.3°F | Resp 17 | Ht 68.0 in | Wt 201.2 lb

## 2019-03-21 DIAGNOSIS — Z171 Estrogen receptor negative status [ER-]: Secondary | ICD-10-CM

## 2019-03-21 DIAGNOSIS — C50412 Malignant neoplasm of upper-outer quadrant of left female breast: Secondary | ICD-10-CM

## 2019-03-21 DIAGNOSIS — M25559 Pain in unspecified hip: Secondary | ICD-10-CM | POA: Insufficient documentation

## 2019-03-21 DIAGNOSIS — M79606 Pain in leg, unspecified: Secondary | ICD-10-CM | POA: Insufficient documentation

## 2019-03-21 LAB — CBC WITH DIFFERENTIAL (CANCER CENTER ONLY)
Abs Immature Granulocytes: 0.02 10*3/uL (ref 0.00–0.07)
Basophils Absolute: 0.1 10*3/uL (ref 0.0–0.1)
Basophils Relative: 1 %
Eosinophils Absolute: 0.3 10*3/uL (ref 0.0–0.5)
Eosinophils Relative: 4 %
HCT: 39 % (ref 36.0–46.0)
Hemoglobin: 12.9 g/dL (ref 12.0–15.0)
Immature Granulocytes: 0 %
Lymphocytes Relative: 33 %
Lymphs Abs: 1.9 10*3/uL (ref 0.7–4.0)
MCH: 29.1 pg (ref 26.0–34.0)
MCHC: 33.1 g/dL (ref 30.0–36.0)
MCV: 88 fL (ref 80.0–100.0)
Monocytes Absolute: 0.5 10*3/uL (ref 0.1–1.0)
Monocytes Relative: 8 %
Neutro Abs: 3.1 10*3/uL (ref 1.7–7.7)
Neutrophils Relative %: 54 %
Platelet Count: 291 10*3/uL (ref 150–400)
RBC: 4.43 MIL/uL (ref 3.87–5.11)
RDW: 12.9 % (ref 11.5–15.5)
WBC Count: 5.8 10*3/uL (ref 4.0–10.5)
nRBC: 0 % (ref 0.0–0.2)

## 2019-03-21 LAB — CMP (CANCER CENTER ONLY)
ALT: 21 U/L (ref 0–44)
AST: 20 U/L (ref 15–41)
Albumin: 3.8 g/dL (ref 3.5–5.0)
Alkaline Phosphatase: 110 U/L (ref 38–126)
Anion gap: 9 (ref 5–15)
BUN: 14 mg/dL (ref 8–23)
CO2: 26 mmol/L (ref 22–32)
Calcium: 9 mg/dL (ref 8.9–10.3)
Chloride: 105 mmol/L (ref 98–111)
Creatinine: 0.77 mg/dL (ref 0.44–1.00)
GFR, Est AFR Am: >60 mL/min (ref >60)
GFR, Estimated: >60 mL/min (ref >60)
Glucose, Bld: 90 mg/dL (ref 70–99)
Potassium: 3.6 mmol/L (ref 3.5–5.1)
Sodium: 140 mmol/L (ref 135–145)
Total Bilirubin: 0.3 mg/dL (ref 0.3–1.2)
Total Protein: 7.4 g/dL (ref 6.5–8.1)

## 2019-03-21 NOTE — Telephone Encounter (Signed)
Scheduled appt per 8/17 los.  Left a voice message of appt date and time

## 2019-03-30 ENCOUNTER — Telehealth: Payer: Self-pay | Admitting: *Deleted

## 2019-03-30 NOTE — Telephone Encounter (Signed)
Received call from patient.   Reports that she slipped and fell on her cane and broke the cane.   Reports that she requires order for new cane sent to [facility] . Requested standard cane for gait disturbances.   Ok to order?

## 2019-03-30 NOTE — Telephone Encounter (Signed)
Prescription faxed to [facility]

## 2019-03-30 NOTE — Telephone Encounter (Signed)
Okay to order?

## 2019-04-04 DIAGNOSIS — H04123 Dry eye syndrome of bilateral lacrimal glands: Secondary | ICD-10-CM | POA: Diagnosis not present

## 2019-04-04 DIAGNOSIS — H1013 Acute atopic conjunctivitis, bilateral: Secondary | ICD-10-CM | POA: Diagnosis not present

## 2019-05-10 ENCOUNTER — Other Ambulatory Visit: Payer: Self-pay | Admitting: Family Medicine

## 2019-05-10 DIAGNOSIS — C50412 Malignant neoplasm of upper-outer quadrant of left female breast: Secondary | ICD-10-CM

## 2019-05-10 DIAGNOSIS — Z171 Estrogen receptor negative status [ER-]: Secondary | ICD-10-CM

## 2019-05-13 ENCOUNTER — Ambulatory Visit: Payer: Medicare Other | Admitting: Family Medicine

## 2019-05-18 ENCOUNTER — Ambulatory Visit (INDEPENDENT_AMBULATORY_CARE_PROVIDER_SITE_OTHER): Payer: Medicare Other | Admitting: Family Medicine

## 2019-05-18 ENCOUNTER — Encounter: Payer: Self-pay | Admitting: Family Medicine

## 2019-05-18 ENCOUNTER — Other Ambulatory Visit: Payer: Self-pay

## 2019-05-18 VITALS — BP 138/82 | HR 98 | Temp 98.5°F | Resp 14 | Ht 68.0 in | Wt 202.0 lb

## 2019-05-18 DIAGNOSIS — I10 Essential (primary) hypertension: Secondary | ICD-10-CM

## 2019-05-18 DIAGNOSIS — M8589 Other specified disorders of bone density and structure, multiple sites: Secondary | ICD-10-CM

## 2019-05-18 DIAGNOSIS — J45909 Unspecified asthma, uncomplicated: Secondary | ICD-10-CM | POA: Insufficient documentation

## 2019-05-18 DIAGNOSIS — M179 Osteoarthritis of knee, unspecified: Secondary | ICD-10-CM | POA: Insufficient documentation

## 2019-05-18 DIAGNOSIS — M170 Bilateral primary osteoarthritis of knee: Secondary | ICD-10-CM | POA: Diagnosis not present

## 2019-05-18 DIAGNOSIS — F419 Anxiety disorder, unspecified: Secondary | ICD-10-CM | POA: Diagnosis not present

## 2019-05-18 DIAGNOSIS — E782 Mixed hyperlipidemia: Secondary | ICD-10-CM | POA: Diagnosis not present

## 2019-05-18 DIAGNOSIS — J4520 Mild intermittent asthma, uncomplicated: Secondary | ICD-10-CM

## 2019-05-18 DIAGNOSIS — M1710 Unilateral primary osteoarthritis, unspecified knee: Secondary | ICD-10-CM | POA: Insufficient documentation

## 2019-05-18 DIAGNOSIS — E785 Hyperlipidemia, unspecified: Secondary | ICD-10-CM | POA: Insufficient documentation

## 2019-05-18 MED ORDER — ZOLPIDEM TARTRATE 10 MG PO TABS: 10.0000 mg | ORAL_TABLET | Freq: Every evening | ORAL | 3 refills | Status: DC | PRN

## 2019-05-18 MED ORDER — ALBUTEROL SULFATE (2.5 MG/3ML) 0.083% IN NEBU: 2.5000 mg | INHALATION_SOLUTION | Freq: Four times a day (QID) | RESPIRATORY_TRACT | 3 refills | Status: DC | PRN

## 2019-05-18 MED ORDER — ALPRAZOLAM 1 MG PO TABS: 1.0000 mg | ORAL_TABLET | Freq: Two times a day (BID) | ORAL | 2 refills | Status: DC | PRN

## 2019-05-18 MED ORDER — ALBUTEROL SULFATE HFA 108 (90 BASE) MCG/ACT IN AERS: INHALATION_SPRAY | RESPIRATORY_TRACT | 2 refills | Status: DC

## 2019-05-18 MED ORDER — LIDOCAINE 5 % EX PTCH: 0.5000 | MEDICATED_PATCH | Freq: Every day | CUTANEOUS | 3 refills | Status: DC | PRN

## 2019-05-18 NOTE — Patient Instructions (Addendum)
We will call with lab results Continue current meds  Referral to orthopedics  F/U 4 months

## 2019-05-18 NOTE — Assessment & Plan Note (Signed)
bp looks okay, no current meds

## 2019-05-18 NOTE — Assessment & Plan Note (Signed)
Continue her xanax, Lorrin Mais for sleep Has tried other meds

## 2019-05-18 NOTE — Assessment & Plan Note (Signed)
Pt states she would take the cholesterol med that her sister has, she will call and verify what that is Check labs today Goal LDL < 120

## 2019-05-18 NOTE — Progress Notes (Signed)
Subjective:    Patient ID: Mary Wise, female    DOB: 09/29/1950, 68 y.o.   MRN: [id number]  Patient presents for Follow-up (is not fasting)  Patient here to follow-up chronic medical problems.  Medications reviewed Hypertension she has maintained her blood pressure off of medication. She still followed by oncology secondary to her breast cancer history  Hyperlipidemia she is on fish oil and trying to monitor with her diet Tried her on Lipitor she states that she broke out in hives with the medication.   Her sister takes Simvastatin she thinks?   Generalized anxiety disorder she continues with Ambien for sleep and alprazolam as needed  She did have bone density done after our last visit that showed osteopenia she is on calcium and vitamin D  Peripheral neuropathy this is controlled with gabapentin.   She would like to proceed with Raliegh Ip- for her knees know, has known OA, back pain,she put this off due to her breast cancer     Note she also has asthma she has been using her albuterol neb daily.  States that she was given other medication inhalers by her oncologist they made her jittery.  States 1 was read which sounds like she was describing proair but then said the other had 3 steroids in it?? Does like her nebulizer works the best for her.   Review Of Systems:  GEN- denies fatigue, fever, weight loss,weakness, recent illness HEENT- denies eye drainage, change in vision, nasal discharge, CVS- denies chest pain, palpitations RESP- denies SOB, cough, wheeze ABD- denies N/V, change in stools, abd pain GU- denies dysuria, hematuria, dribbling, incontinence MSK- + joint pain, muscle aches, injury Neuro- denies headache, dizziness, syncope, seizure activity       Objective:    BP 138/82   Pulse 98   Temp 98.5 F (36.9 C) (Oral)   Resp 14   Ht 5\' 8"  (1.727 m)   Wt 202 lb (91.6 kg)   SpO2 93%   BMI 30.71 kg/m  GEN- NAD, alert and oriented x3 HEENT- PERRL, EOMI,  non injected sclera, pink conjunctiva, MMM, oropharynx clear Neck- Supple, no thyromegaly CVS- RRR, no murmur RESP-CTAB ABD-NABS,soft,NT,ND Psych- normal affect and mood EXT- No edema Pulses- Radial, DP- 2+        Assessment & Plan:      Problem List Items Addressed This Visit      Unprioritized   Anxiety    Continue her xanax, ambien for sleep Has tried other meds      Relevant Medications   ALPRAZolam (XANAX) 1 MG tablet   Asthma    Reviewed chart, can only find different albuterol brands  I dont think she has been preventative med that I can tell She is reluctant to try something different,      Relevant Medications   albuterol (PROVENTIL) (2.5 MG/3ML) 0.083% nebulizer solution   albuterol (VENTOLIN HFA) 108 (90 Base) MCG/ACT inhaler   HTN, goal below 140/90    bp looks okay, no current meds      Relevant Orders   Comprehensive metabolic panel   Lipid panel   Hyperlipemia    Pt states she would take the cholesterol med that her sister has, she will call and verify what that is Check labs today Goal LDL < 120      OA (osteoarthritis) of knee   Relevant Medications   lidocaine (LIDODERM) 5 %   Other Relevant Orders   Ambulatory referral to Orthopedic Surgery  Osteopenia - Primary      Note: This dictation was prepared with Dragon dictation along with smaller phrase technology. Any transcriptional errors that result from this process are unintentional.

## 2019-05-18 NOTE — Assessment & Plan Note (Signed)
Reviewed chart, can only find different albuterol brands  I dont think she has been preventative med that I can tell She is reluctant to try something different,

## 2019-05-19 ENCOUNTER — Telehealth: Payer: Self-pay | Admitting: *Deleted

## 2019-05-19 LAB — LIPID PANEL
Cholesterol: 243 mg/dL — ABNORMAL HIGH (ref <200)
HDL: 58 mg/dL (ref >=50)
LDL Cholesterol (Calc): 162 mg/dL (calc) — ABNORMAL HIGH
Non-HDL Cholesterol (Calc): 185 mg/dL (calc) — ABNORMAL HIGH (ref <130)
Total CHOL/HDL Ratio: 4.2 (calc) (ref <5.0)
Triglycerides: 117 mg/dL (ref <150)

## 2019-05-19 LAB — COMPREHENSIVE METABOLIC PANEL
AG Ratio: 1.4 (calc) (ref 1.0–2.5)
ALT: 23 U/L (ref 6–29)
AST: 24 U/L (ref 10–35)
Albumin: 4.3 g/dL (ref 3.6–5.1)
Alkaline phosphatase (APISO): 113 U/L (ref 37–153)
BUN: 15 mg/dL (ref 7–25)
CO2: 29 mmol/L (ref 20–32)
Calcium: 9.6 mg/dL (ref 8.6–10.4)
Chloride: 103 mmol/L (ref 98–110)
Creat: 0.76 mg/dL (ref 0.50–0.99)
Globulin: 3.1 g/dL (calc) (ref 1.9–3.7)
Glucose, Bld: 82 mg/dL (ref 65–99)
Potassium: 4 mmol/L (ref 3.5–5.3)
Sodium: 141 mmol/L (ref 135–146)
Total Bilirubin: 0.4 mg/dL (ref 0.2–1.2)
Total Protein: 7.4 g/dL (ref 6.1–8.1)

## 2019-05-19 NOTE — Telephone Encounter (Signed)
Received call from patient.   Reports that the medication is Simvastatin 20mg .   Also inquired if she can take krill oil for her cholesterol.   MD please advise.

## 2019-05-20 NOTE — Telephone Encounter (Signed)
Noted in her lab results

## 2019-05-25 DIAGNOSIS — M170 Bilateral primary osteoarthritis of knee: Secondary | ICD-10-CM | POA: Diagnosis not present

## 2019-05-26 ENCOUNTER — Encounter: Payer: Self-pay | Admitting: *Deleted

## 2019-05-26 MED ORDER — SIMVASTATIN 20 MG PO TABS: 20.0000 mg | ORAL_TABLET | ORAL | 3 refills | Status: DC

## 2019-06-12 ENCOUNTER — Other Ambulatory Visit: Payer: Self-pay | Admitting: Family Medicine

## 2019-06-12 DIAGNOSIS — C50412 Malignant neoplasm of upper-outer quadrant of left female breast: Secondary | ICD-10-CM

## 2019-06-12 DIAGNOSIS — Z171 Estrogen receptor negative status [ER-]: Secondary | ICD-10-CM

## 2019-06-13 DIAGNOSIS — M1712 Unilateral primary osteoarthritis, left knee: Secondary | ICD-10-CM | POA: Diagnosis present

## 2019-06-13 NOTE — H&P (Signed)
KNEE ARTHROPLASTY ADMISSION H&P  Patient ID: Mary Wise MRN: [id number] DOB/AGE: May 12, 1951 68 y.o.  Chief Complaint: left knee pain.  Planned Procedure Date: 07/05/19 Medical Clearance by Diamantina Providence, PA-C     HPI: Mary Wise is a 68 y.o. female with a history of anxiety, gout, hypertension, hyperlipidemia, and breast cancer who presents for evaluation of OSTEOARTHRITIS LEFT KNEE. The patient has a history of pain and functional disability in the left knee due to arthritis and has failed non-surgical conservative treatments for greater than 12 weeks to include NSAID's and/or analgesics, corticosteriod injections, viscosupplementation injections and activity modification.  Onset of symptoms was gradual, starting 7 years ago with gradually worsening course since that time.  Patient currently rates pain at 9 out of 10 with activity. Patient has night pain, worsening of pain with activity and weight bearing and pain that interferes with activities of daily living.  Patient has evidence of periarticular osteophytes and joint space narrowing by imaging studies.  There is no active infection.  Past Medical History:  Diagnosis Date  . Anxiety   . Asthma   . Cancer [facility])    left breast cancer  . Family history of cancer   . Genetic testing 02/01/2018   Multi-Cancer panel (83 genes) @ [facility] - No pathogenic mutations detected  . Hemorrhoids   . History of radiation therapy 05/27/18- 06/23/18   Left Breast 40.05 Gy in 15 fractions, left breast boost 10 Gy in 5 fractions.   . Lipoma of left shoulder   . Migraine   . Migraine   . Osteopenia   . 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 ABDOMINAL HYSTERECTOMY    . APPENDECTOMY    . CESAREAN SECTION    . CHOLECYSTECTOMY    . ERCP    . IR FLUORO GUIDE PORT INSERTION RIGHT  10/05/2017  . IR REMOVAL TUN ACCESS W/ PORT W/O FL MOD SED  05/13/2018  . IR US GUIDE VASC ACCESS  RIGHT  10/05/2017  . LIPOMA EXCISION Left 03/16/2018   left shoulder  . LIVER SURGERY    . PARTIAL MASTECTOMY WITH AXILLARY SENTINEL LYMPH NODE BIOPSY Left 07/31/2017   Procedure: LEFT PARTIAL MASTECTOMY WITH AXILLARY SENTINEL LYMPH NODE BIOPSY;  Surgeon: Virl Cagey, MD;  Location: [facility];  Service: General;  Laterality: Left;   Allergies  Allergen Reactions  . Onion Other (See Comments)    REACTION: results in coma state  . Penicillins Hives, Shortness Of Breath and Swelling    Throat swelling Has patient had a PCN reaction causing immediate rash, facial/tongue/throat swelling, SOB or lightheadedness with hypotension: Yes Has patient had a PCN reaction causing severe rash involving mucus membranes or skin necrosis: No Has patient had a PCN reaction that required hospitalization: Yes Has patient had a PCN reaction occurring within the last 10 years: No If all of the above answers are "NO", then may proceed with Cephalosporin use.   . Sulfa Antibiotics Hives and Shortness Of Breath  . Yellow Dye     REACTION: #5 and #90 "will kill me"  . Tape Swelling    Pt states she had tape and Tegaderm used on her in [place]. This caused swelling to her face and neck. Wounds and bleeding from area tape/tegaderm was used.   . Clindamycin/Lincomycin Hives and Nausea And Vomiting  . Lipitor [Atorvastatin Calcium] Hives   Prior to Admission medications  Medication Sig Start Date End Date Taking? Authorizing Provider  albuterol (PROVENTIL) (2.5 MG/3ML) 0.083% nebulizer solution Take 3 mLs (2.5 mg total) by nebulization every 6 (six) hours as needed for wheezing or shortness of breath. 05/18/19   Alycia Rossetti, MD  albuterol (VENTOLIN HFA) 108 (90 Base) MCG/ACT inhaler INHALE 2 PUFFS BY MOUTH EVERY 6 HOURS AS NEEDED FOR WHEEZING OR SHORTNESS OF BREATH 05/18/19   Bunk Foss, Modena Nunnery, MD  ALPRAZolam Duanne Moron) 1 MG tablet Take 1 tablet (1 mg total) by mouth 2 (two) times daily as needed. for  anxiety 05/18/19   Alycia Rossetti, MD  cyclobenzaprine (FLEXERIL) 10 MG tablet Take 1 tablet (10 mg total) by mouth 3 (three) times daily as needed for muscle spasms. 01/10/19   Alycia Rossetti, MD  gabapentin (NEURONTIN) 400 MG capsule TAKE 1 CAPSULE BY MOUTH THREE TIMES DAILY 05/10/19   Alycia Rossetti, MD  Hypromellose (ARTIFICIAL TEARS OP) Apply to eye 2 (two) times daily. Doesn't do drops in eyes does as a wash to flush eyes out    [provider]  lidocaine (LIDODERM) 5 % Place 0.5-1 patches onto the skin daily as needed (pain). Remove & Discard patch within 12 hours or as directed by MD 05/18/19   Alycia Rossetti, MD  simvastatin (ZOCOR) 20 MG tablet Take 1 tablet (20 mg total) by mouth 3 (three) times a week. 05/27/19   [place], Modena Nunnery, MD  SUMAtriptan (IMITREX) 100 MG tablet Take 1 tablet 100mg   By mouth at onset of migraine, If symptoms persist 2 hours later then take additional dose.Maximum 2 per 24 hours 01/10/19   [place], Modena Nunnery, MD  zolpidem (AMBIEN) 10 MG tablet Take 1 tablet (10 mg total) by mouth at bedtime as needed for sleep. 05/18/19   Alycia Rossetti, MD   Social History   Socioeconomic History  . Marital status: Single    Spouse name: Not on file  . Number of children: Not on file  . Years of education: Not on file  . Highest education level: Not on file  Occupational History  . Not on file  Social Needs  . Financial resource strain: Not on file  . Food insecurity    Worry: Not on file    Inability: Not on file  . Transportation needs    Medical: No    Non-medical: No  Tobacco Use  . Smoking status: Never Smoker  . Smokeless tobacco: Never Used  Substance and Sexual Activity  . Alcohol use: No  . Drug use: Never  . Sexual activity: Never  Lifestyle  . Physical activity    Days per week: Not on file    Minutes per session: Not on file  . Stress: Not on file  Relationships  . Social Herbalist on phone: Not on file    Gets  together: Not on file    Attends religious service: Not on file    Active member of club or organization: Not on file    Attends meetings of clubs or organizations: Not on file    Relationship status: Not on file  Other Topics Concern  . Not on file  Social History Narrative   Lives in [place].    Family History  Problem Relation Age of Onset  . Cancer Mother 43       unk. primary; deceased 56  . Cancer Father 80       brain cancer;  deceased 53  . Ovarian cancer Sister 34       deceased 15  . Diabetes Brother   . Breast cancer Maternal Grandmother        dx 45s; deceased 37  . Cancer Other        "female ca"  . Diabetes Sister   . Diabetes Sister   . Diabetes Sister   . Cancer Paternal Uncle        brain cancer   . Cancer Paternal Grandmother        "female ca"  . Cancer Paternal Grandfather        lung cancer  . Cancer Paternal Uncle        brain tumor   . Cancer Paternal Uncle        brain tumor   . Stomach cancer Maternal Aunt 87       deceased 11  . Cancer Paternal Aunt        ink. primary in 2 aunts  . Melanoma Other   . Lung cancer Maternal Aunt   . Stomach cancer Cousin        2 sons of an unaffected maternal uncle    ROS: Currently denies lightheadedness, dizziness, Fever, chills, CP.  SOB chronic and controlled w/ albuterol.   No personal history of DVT, PE, MI, or CVA. No loose teeth or dentures.   All other systems have been reviewed and were otherwise currently negative with the exception of those mentioned in the HPI and as above.  Objective: Vitals: Ht: 5'8" Wt: 203 Temp: 98.2 BP: 148/91 Pulse: 90 O2 97% on room air.   Physical Exam: General: Alert, NAD.  Antalgic Gait  HEENT: EOMI, Good Neck Extension  Pulm: No increased work of breathing.  Clear B/L A/P w/o crackle or wheeze.  CV: RRR, No m/g/r appreciated  GI: soft, NT, ND Neuro: Neuro without gross focal deficit.  Sensation intact distally Skin: No lesions in the  area of chief complaint MSK/Surgical Site: Bilateral knees have significant joint line tenderness, most significant medially and on the left side.  No signs of infection.  Mild effusion.  Left knee range of motion 10-95 degrees versus 2-110 degrees on the right.  Stable to varus and valgus stress.    Imaging Review Plain radiographs demonstrate severe degenerative joint disease of both knees.   Preoperative templating of the joint replacement has been completed, documented, and submitted to the Operating Room personnel in order to optimize intra-operative equipment management.  Assessment: OSTEOARTHRITIS LEFT KNEE Principal Problem:   Primary osteoarthritis of left knee Active Problems:   HTN, goal below 140/90   Anxiety   Family history of cancer   Asthma   Hyperlipemia   Plan: Plan for Procedure(s): TOTAL KNEE ARTHROPLASTY  The patient history, physical exam, clinical judgement of the provider and imaging are consistent with end stage degenerative joint disease and total joint arthroplasty is deemed medically necessary. The treatment options including medical management, injection therapy, and arthroplasty were discussed at length. The risks and benefits of Procedure(s): TOTAL KNEE ARTHROPLASTY were presented and reviewed.  The risks of nonoperative treatment, versus surgical intervention including but not limited to continued pain, aseptic loosening, stiffness, dislocation/subluxation, infection, bleeding, nerve injury, blood clots, cardiopulmonary complications, morbidity, mortality, among others were discussed. The patient verbalizes understanding and wishes to proceed with the plan.  Patient is being admitted for inpatient treatment for surgery, pain control, PT, prophylactic antibiotics, VTE prophylaxis, progressive ambulation, ADL's and discharge planning.  Dental prophylaxis discussed and recommended for 2 years postoperatively.   The patient does meet the criteria for TXA  which will be used perioperatively.    ASA 81 mg BID will be used postoperatively for DVT prophylaxis in addition to SCDs, and early ambulation.  Vancomycin for preop antibiotic (PCN and Clindamycin allergy).  Plan for Tylenol, oxycodone for pain.  No Celebrex dt sulfa allergy.  Baclofen for spasm.  Ibuprofen for inflammation.  Omeprazole for gastric protection.  Continue Gabapentin.  The patient is planning to be discharged home with OPPT in care of her daughter.  Anticipated LOS less than 2 midnights.  Insurance approval for inpatient due to: - Age 51 and older with one or more of the following:  - Expected need for hospital services (PT, OT, Nursing) required for safe  discharge    Prudencio Burly III, PA-C 06/13/2019 8:14 AM

## 2019-06-13 NOTE — Telephone Encounter (Signed)
Last refilled: 05/10/2019 Last office visit: 05/18/2019

## 2019-06-14 ENCOUNTER — Telehealth: Payer: Self-pay | Admitting: Nurse Practitioner

## 2019-06-14 NOTE — Telephone Encounter (Signed)
Returned patient's phone call regarding cancelling an appointment, per patient's request 12/17 appointment has been cancelled. Patient will give a call back when ready to reschedule.

## 2019-06-22 ENCOUNTER — Other Ambulatory Visit: Payer: Self-pay | Admitting: Orthopedic Surgery

## 2019-06-22 NOTE — Care Plan (Signed)
Met with patient prior to surgery in the office for H&P visit. She plans to discharge to home with her daughter Mary Wise - [address] [place], ) She will go to OPPT at [facility] OP at [facility]. Arrangements have been made. Equipment ordered. Patient and MD in agreement with plan. Choice offered.    Ladell Heads, [place]

## 2019-06-23 NOTE — Patient Instructions (Addendum)
DUE TO COVID-19 ONLY ONE VISITOR IS ALLOWED TO COME WITH YOU AND STAY IN THE WAITING ROOM ONLY DURING PRE OP AND PROCEDURE DAY OF SURGERY. THE 1 VISITOR MAY VISIT WITH YOU AFTER SURGERY IN YOUR PRIVATE ROOM DURING VISITING HOURS ONLY!  YOU NEED TO HAVE A COVID 19 TEST ON_Friday 11/27/2020______ @_9 :25am______, THIS TEST MUST BE DONE BEFORE SURGERY, COME to [address] [place] ONCE YOUR COVID TEST IS COMPLETED, PLEASE BEGIN THE QUARANTINE INSTRUCTIONS AS OUTLINED IN YOUR HANDOUT.                ELZENA STOFFERS    Your procedure is scheduled on: Tuesday 07/05/2019   Report to [facility] Main  Entrance    Report to Short Stay at 5:30 am.    Call this number if you have problems the morning of surgery [phone]    Remember: Do not eat food after Midnight.   BRUSH YOUR TEETH MORNING OF SURGERY AND RINSE YOUR MOUTH OUT, NO CHEWING GUM CANDY OR MINTS.   NO SOLID FOOD AFTER MIDNIGHT THE NIGHT PRIOR TO SURGERY. NOTHING BY MOUTH EXCEPT CLEAR LIQUIDS UNTIL 4:30 am.   PLEASE FINISH ENSURE DRINK PER SURGEON ORDER  WHICH NEEDS TO BE COMPLETED AT 4:30 am .   CLEAR LIQUID DIET   Foods Allowed                                                                     Foods Excluded  Coffee and tea, regular and decaf                             liquids that you cannot  Plain Jell-O any favor except red or purple             see through such as: Fruit ices (not with fruit pulp)                                     milk, soups, orange juice  Iced Popsicles                                    All solid food Carbonated beverages, regular and diet                                    Cranberry, grape and apple juices Sports drinks like Gatorade Lightly seasoned clear broth or consume(fat free) Sugar, honey syrup  Sample Menu Breakfast                                Lunch                                     Supper Cranberry juice  Beef broth                             Chicken broth Jell-O                                     Grape juice                           Apple juice Coffee or tea                        Jell-O                                      Popsicle                                                Coffee or tea                        Coffee or tea  _____________________________________________________________________     Take these medicines the morning of surgery with A SIP OF WATER: Alprazolam (Xanax), Albuterol (Ventolin inhaler) if needed and bring to hospital with you. Gabapentin (Neurontin) if needed, Imitrex if needed, Eye Drops if needed.                                 You may not have any metal on your body including hair pins and              piercings  Do not wear jewelry, make-up, lotions, powders or perfumes, deodorant             Do not wear nail polish on your fingernails.  Do not shave  48 hours prior to surgery.                 Do not bring valuables to the hospital. [place].  Contacts, dentures or bridgework may not be worn into surgery.  Leave suitcase in the car. After surgery it may be brought to your room.                  Please read over the following fact sheets you were given: _____________________________________________________________________             [facility] - Preparing for Surgery Before surgery, you can play an important role.  Because skin is not sterile, your skin needs to be as free of germs as possible.  You can reduce the number of germs on your skin by washing with CHG (chlorahexidine gluconate) soap before surgery.  CHG is an antiseptic cleaner which kills germs and bonds with the skin to continue killing germs even after washing. Please DO NOT use if you have an allergy to CHG or antibacterial soaps.  If your skin becomes reddened/irritated stop using the CHG and inform your nurse when you arrive at Short Stay. Do not  shave (including legs  and underarms) for at least 48 hours prior to the first CHG shower.  You may shave your face/neck. Please follow these instructions carefully:  1.  Shower with CHG Soap the night before surgery and the  morning of Surgery.  2.  If you choose to wash your hair, wash your hair first as usual with your  normal  shampoo.  3.  After you shampoo, rinse your hair and body thoroughly to remove the  shampoo.                            4.  Use CHG as you would any other liquid soap.  You can apply chg directly  to the skin and wash                       Gently with a scrungie or clean washcloth.  5.  Apply the CHG Soap to your body ONLY FROM THE NECK DOWN.   Do not use on face/ open                           Wound or open sores. Avoid contact with eyes, ears mouth and genitals (private parts).                       Wash face,  Genitals (private parts) with your normal soap.             6.  Wash thoroughly, paying special attention to the area where your surgery  will be performed.  7.  Thoroughly rinse your body with warm water from the neck down.  8.  DO NOT shower/wash with your normal soap after using and rinsing off  the CHG Soap.                9.  Pat yourself dry with a clean towel.            10.  Wear clean pajamas.            11.  Place clean sheets on your bed the night of your first shower and do not  sleep with pets. Day of Surgery : Do not apply any lotions/deodorants the morning of surgery.  Please wear clean clothes to the hospital/surgery center.  FAILURE TO FOLLOW THESE INSTRUCTIONS MAY RESULT IN THE CANCELLATION OF YOUR SURGERY PATIENT SIGNATURE_________________________________  NURSE SIGNATURE__________________________________  ________________________________________________________________________   Adam Phenix  An incentive spirometer is a tool that can help keep your lungs clear and active. This tool measures how well you are filling your lungs with each breath.  Taking long deep breaths may help reverse or decrease the chance of developing breathing (pulmonary) problems (especially infection) following:  A long period of time when you are unable to move or be active. BEFORE THE PROCEDURE   If the spirometer includes an indicator to show your best effort, your nurse or respiratory therapist will set it to a desired goal.  If possible, sit up straight or lean slightly forward. Try not to slouch.  Hold the incentive spirometer in an upright position. INSTRUCTIONS FOR USE  1. Sit on the edge of your bed if possible, or sit up as far as you can in bed or on a chair. 2. Hold the incentive spirometer in an upright position. 3. Breathe out normally. 4. Place the mouthpiece in  your mouth and seal your lips tightly around it. 5. Breathe in slowly and as deeply as possible, raising the piston or the ball toward the top of the column. 6. Hold your breath for 3-5 seconds or for as long as possible. Allow the piston or ball to fall to the bottom of the column. 7. Remove the mouthpiece from your mouth and breathe out normally. 8. Rest for a few seconds and repeat Steps 1 through 7 at least 10 times every 1-2 hours when you are awake. Take your time and take a few normal breaths between deep breaths. 9. The spirometer may include an indicator to show your best effort. Use the indicator as a goal to work toward during each repetition. 10. After each set of 10 deep breaths, practice coughing to be sure your lungs are clear. If you have an incision (the cut made at the time of surgery), support your incision when coughing by placing a pillow or rolled up towels firmly against it. Once you are able to get out of bed, walk around indoors and cough well. You may stop using the incentive spirometer when instructed by your caregiver.  RISKS AND COMPLICATIONS  Take your time so you do not get dizzy or light-headed.  If you are in pain, you may need to take or ask for pain  medication before doing incentive spirometry. It is harder to take a deep breath if you are having pain. AFTER USE  Rest and breathe slowly and easily.  It can be helpful to keep track of a log of your progress. Your caregiver can provide you with a simple table to help with this. If you are using the spirometer at home, follow these instructions: [place] IF:   You are having difficultly using the spirometer.  You have trouble using the spirometer as often as instructed.  Your pain medication is not giving enough relief while using the spirometer.  You develop fever of 100.5 F (38.1 C) or higher. SEEK IMMEDIATE MEDICAL CARE IF:   You cough up bloody sputum that had not been present before.  You develop fever of 102 F (38.9 C) or greater.  You develop worsening pain at or near the incision site. MAKE SURE YOU:   Understand these instructions.  Will watch your condition.  Will get help right away if you are not doing well or get worse. Document Released: 12/01/2006 Document Revised: 10/13/2011 Document Reviewed: 02/01/2007 [facility] Patient Information 2014 [place], [place].   ________________________________________________________________________

## 2019-06-23 NOTE — Progress Notes (Signed)
PCP - Vic Blackbird MD Cardiologist - none  Chest x-ray - none EKG - 06/27/2019 chart and epic Stress Test - none ECHO - 10/08/2017 epic Cardiac Cath - none  Sleep Study - none CPAP - none  Fasting Blood Sugar -  Checks Blood Sugar _____ times a day  Blood Thinner Instructions: none Aspirin Instructions: Last Dose:  Anesthesia review:   Patient denies shortness of breath, fever, cough and chest pain at PAT appointment   Patient verbalized understanding of instructions that were given to them at the PAT appointment. Patient was also instructed that they will need to review over the PAT instructions again at home before surgery.

## 2019-06-27 ENCOUNTER — Encounter (HOSPITAL_COMMUNITY)
Admission: RE | Admit: 2019-06-27 | Discharge: 2019-06-27 | Disposition: A | Discharge status: Home / Self Care | Payer: Medicare Other | Source: Ambulatory Visit | Attending: Orthopedic Surgery | Admitting: Orthopedic Surgery

## 2019-06-27 ENCOUNTER — Other Ambulatory Visit: Payer: Self-pay

## 2019-06-27 ENCOUNTER — Encounter (HOSPITAL_COMMUNITY): Payer: Self-pay

## 2019-06-27 DIAGNOSIS — Z01818 Encounter for other preprocedural examination: Secondary | ICD-10-CM | POA: Insufficient documentation

## 2019-06-27 DIAGNOSIS — M1712 Unilateral primary osteoarthritis, left knee: Secondary | ICD-10-CM | POA: Diagnosis not present

## 2019-06-27 DIAGNOSIS — I10 Essential (primary) hypertension: Secondary | ICD-10-CM | POA: Diagnosis not present

## 2019-06-27 DIAGNOSIS — R9431 Abnormal electrocardiogram [ECG] [EKG]: Secondary | ICD-10-CM | POA: Insufficient documentation

## 2019-06-27 LAB — CBC
HCT: 42.5 % (ref 36.0–46.0)
Hemoglobin: 13.6 g/dL (ref 12.0–15.0)
MCH: 29.3 pg (ref 26.0–34.0)
MCHC: 32 g/dL (ref 30.0–36.0)
MCV: 91.6 fL (ref 80.0–100.0)
Platelets: 318 10*3/uL (ref 150–400)
RBC: 4.64 MIL/uL (ref 3.87–5.11)
RDW: 12.9 % (ref 11.5–15.5)
WBC: 7 10*3/uL (ref 4.0–10.5)
nRBC: 0 % (ref 0.0–0.2)

## 2019-06-27 LAB — SURGICAL PCR SCREEN
MRSA, PCR: NEGATIVE
Staphylococcus aureus: NEGATIVE

## 2019-06-27 LAB — URINALYSIS, ROUTINE W REFLEX MICROSCOPIC
Bacteria, UA: NONE SEEN
Bilirubin Urine: NEGATIVE
Glucose, UA: NEGATIVE mg/dL
Ketones, ur: NEGATIVE mg/dL
Leukocytes,Ua: NEGATIVE
Nitrite: NEGATIVE
Protein, ur: NEGATIVE mg/dL
Specific Gravity, Urine: 1.019 (ref 1.005–1.030)
pH: 5 (ref 5.0–8.0)

## 2019-06-27 LAB — BASIC METABOLIC PANEL
Anion gap: 10 (ref 5–15)
BUN: 14 mg/dL (ref 8–23)
CO2: 26 mmol/L (ref 22–32)
Calcium: 9.4 mg/dL (ref 8.9–10.3)
Chloride: 104 mmol/L (ref 98–111)
Creatinine, Ser: 0.8 mg/dL (ref 0.44–1.00)
GFR calc Af Amer: >60 mL/min (ref >60)
GFR calc non Af Amer: >60 mL/min (ref >60)
Glucose, Bld: 89 mg/dL (ref 70–99)
Potassium: 3.5 mmol/L (ref 3.5–5.1)
Sodium: 140 mmol/L (ref 135–145)

## 2019-07-01 ENCOUNTER — Other Ambulatory Visit (HOSPITAL_COMMUNITY)
Admission: RE | Admit: 2019-07-01 | Discharge: 2019-07-01 | Disposition: A | Discharge status: Home / Self Care | Payer: Medicare Other | Source: Ambulatory Visit | Attending: Orthopedic Surgery | Admitting: Orthopedic Surgery

## 2019-07-01 DIAGNOSIS — Z20828 Contact with and (suspected) exposure to other viral communicable diseases: Secondary | ICD-10-CM | POA: Diagnosis not present

## 2019-07-01 DIAGNOSIS — Z01812 Encounter for preprocedural laboratory examination: Secondary | ICD-10-CM | POA: Insufficient documentation

## 2019-07-01 LAB — SARS CORONAVIRUS 2 (TAT 6-24 HRS): SARS Coronavirus 2: NEGATIVE

## 2019-07-04 MED ORDER — BUPIVACAINE LIPOSOME 1.3 % IJ SUSP
20.0000 mL | Freq: Once | INTRAMUSCULAR | Status: DC
  Filled 2019-07-04: qty 20

## 2019-07-05 ENCOUNTER — Inpatient Hospital Stay (HOSPITAL_COMMUNITY): Payer: Medicare Other | Admitting: Physician Assistant

## 2019-07-05 ENCOUNTER — Encounter (HOSPITAL_COMMUNITY)
Admission: RE | Disposition: A | Discharge status: Nursing Home (Includes ICF) | Payer: Self-pay | Source: Home / Self Care | Attending: Orthopedic Surgery

## 2019-07-05 ENCOUNTER — Inpatient Hospital Stay (HOSPITAL_COMMUNITY): Payer: Medicare Other | Admitting: Anesthesiology

## 2019-07-05 ENCOUNTER — Observation Stay (HOSPITAL_COMMUNITY): Payer: Medicare Other

## 2019-07-05 ENCOUNTER — Other Ambulatory Visit: Payer: Self-pay

## 2019-07-05 ENCOUNTER — Observation Stay (HOSPITAL_COMMUNITY)
Admission: RE | Admit: 2019-07-05 | Discharge: 2019-07-06 | Disposition: A | Discharge status: Nursing Home (Includes ICF) | Payer: Medicare Other | Attending: Orthopedic Surgery | Admitting: Orthopedic Surgery

## 2019-07-05 ENCOUNTER — Encounter (HOSPITAL_COMMUNITY): Payer: Self-pay | Admitting: *Deleted

## 2019-07-05 DIAGNOSIS — G43909 Migraine, unspecified, not intractable, without status migrainosus: Secondary | ICD-10-CM | POA: Insufficient documentation

## 2019-07-05 DIAGNOSIS — R062 Wheezing: Secondary | ICD-10-CM

## 2019-07-05 DIAGNOSIS — M1712 Unilateral primary osteoarthritis, left knee: Secondary | ICD-10-CM

## 2019-07-05 DIAGNOSIS — E785 Hyperlipidemia, unspecified: Secondary | ICD-10-CM | POA: Insufficient documentation

## 2019-07-05 DIAGNOSIS — Z79899 Other long term (current) drug therapy: Secondary | ICD-10-CM | POA: Diagnosis not present

## 2019-07-05 DIAGNOSIS — M170 Bilateral primary osteoarthritis of knee: Secondary | ICD-10-CM | POA: Diagnosis not present

## 2019-07-05 DIAGNOSIS — Z881 Allergy status to other antibiotic agents status: Secondary | ICD-10-CM | POA: Insufficient documentation

## 2019-07-05 DIAGNOSIS — Z888 Allergy status to other drugs, medicaments and biological substances status: Secondary | ICD-10-CM | POA: Insufficient documentation

## 2019-07-05 DIAGNOSIS — Z882 Allergy status to sulfonamides status: Secondary | ICD-10-CM | POA: Insufficient documentation

## 2019-07-05 DIAGNOSIS — I10 Essential (primary) hypertension: Secondary | ICD-10-CM | POA: Insufficient documentation

## 2019-07-05 DIAGNOSIS — J45909 Unspecified asthma, uncomplicated: Secondary | ICD-10-CM | POA: Diagnosis not present

## 2019-07-05 DIAGNOSIS — Z853 Personal history of malignant neoplasm of breast: Secondary | ICD-10-CM | POA: Insufficient documentation

## 2019-07-05 DIAGNOSIS — F419 Anxiety disorder, unspecified: Secondary | ICD-10-CM | POA: Diagnosis not present

## 2019-07-05 DIAGNOSIS — G8918 Other acute postprocedural pain: Secondary | ICD-10-CM | POA: Diagnosis not present

## 2019-07-05 DIAGNOSIS — Z471 Aftercare following joint replacement surgery: Secondary | ICD-10-CM | POA: Diagnosis not present

## 2019-07-05 DIAGNOSIS — Z880 Allergy status to penicillin: Secondary | ICD-10-CM | POA: Insufficient documentation

## 2019-07-05 DIAGNOSIS — Z96652 Presence of left artificial knee joint: Secondary | ICD-10-CM | POA: Diagnosis not present

## 2019-07-05 DIAGNOSIS — E782 Mixed hyperlipidemia: Secondary | ICD-10-CM

## 2019-07-05 DIAGNOSIS — Z809 Family history of malignant neoplasm, unspecified: Secondary | ICD-10-CM

## 2019-07-05 HISTORY — PX: TOTAL KNEE ARTHROPLASTY: SHX125

## 2019-07-05 SURGERY — ARTHROPLASTY, KNEE, TOTAL
Anesthesia: General | Site: Knee | Laterality: Left

## 2019-07-05 MED ORDER — KETOTIFEN FUMARATE 0.025 % OP SOLN
1.0000 [drp] | OPHTHALMIC | Status: DC
  Filled 2019-07-05: qty 5

## 2019-07-05 MED ORDER — LABETALOL HCL 5 MG/ML IV SOLN
INTRAVENOUS | Status: DC | PRN
  Administered 2019-07-05: 10 mg via INTRAVENOUS

## 2019-07-05 MED ORDER — DEXAMETHASONE SODIUM PHOSPHATE 10 MG/ML IJ SOLN
10.0000 mg | Freq: Once | INTRAMUSCULAR | Status: AC
  Administered 2019-07-06: 10 mg via INTRAVENOUS
  Filled 2019-07-05: qty 1

## 2019-07-05 MED ORDER — CHLORHEXIDINE GLUCONATE 4 % EX LIQD: 60.0000 mL | Freq: Once | CUTANEOUS | Status: DC

## 2019-07-05 MED ORDER — OXYCODONE HCL 5 MG PO TABS
5.0000 mg | ORAL_TABLET | Freq: Once | ORAL | Status: AC | PRN
  Administered 2019-07-05: 5 mg via ORAL

## 2019-07-05 MED ORDER — SUMATRIPTAN SUCCINATE 50 MG PO TABS
100.0000 mg | ORAL_TABLET | ORAL | Status: DC | PRN
  Filled 2019-07-05: qty 2

## 2019-07-05 MED ORDER — BUPIVACAINE LIPOSOME 1.3 % IJ SUSP
INTRAMUSCULAR | Status: DC | PRN
  Administered 2019-07-05: 20 mL

## 2019-07-05 MED ORDER — ZOLPIDEM TARTRATE 5 MG PO TABS
5.0000 mg | ORAL_TABLET | Freq: Every day | ORAL | Status: DC
  Administered 2019-07-05: 5 mg via ORAL
  Filled 2019-07-05 (×2): qty 1

## 2019-07-05 MED ORDER — ONDANSETRON HCL 4 MG/2ML IJ SOLN
INTRAMUSCULAR | Status: DC | PRN
  Administered 2019-07-05: 4 mg via INTRAVENOUS

## 2019-07-05 MED ORDER — IBUPROFEN 800 MG PO TABS: 800.0000 mg | ORAL_TABLET | Freq: Three times a day (TID) | ORAL | 0 refills | Status: AC | PRN

## 2019-07-05 MED ORDER — ROPIVACAINE HCL 5 MG/ML IJ SOLN
INTRAMUSCULAR | Status: DC | PRN
  Administered 2019-07-05: 25 mL via PERINEURAL

## 2019-07-05 MED ORDER — ACETAMINOPHEN 500 MG PO TABS
1000.0000 mg | ORAL_TABLET | Freq: Three times a day (TID) | ORAL | Status: DC
  Administered 2019-07-05 – 2019-07-06 (×4): 1000 mg via ORAL
  Filled 2019-07-05 (×4): qty 2

## 2019-07-05 MED ORDER — ACETAMINOPHEN 325 MG PO TABS: 325.0000 mg | ORAL_TABLET | Freq: Four times a day (QID) | ORAL | Status: DC | PRN

## 2019-07-05 MED ORDER — VANCOMYCIN HCL IN DEXTROSE 1-5 GM/200ML-% IV SOLN
1000.0000 mg | INTRAVENOUS | Status: AC
  Administered 2019-07-05: 1000 mg via INTRAVENOUS
  Filled 2019-07-05: qty 200

## 2019-07-05 MED ORDER — ONDANSETRON HCL 4 MG/2ML IJ SOLN
4.0000 mg | Freq: Four times a day (QID) | INTRAMUSCULAR | Status: DC | PRN
  Administered 2019-07-05: 4 mg via INTRAVENOUS
  Filled 2019-07-05: qty 2

## 2019-07-05 MED ORDER — PROPOFOL 500 MG/50ML IV EMUL
INTRAVENOUS | Status: DC | PRN
  Administered 2019-07-05: 130 mg via INTRAVENOUS

## 2019-07-05 MED ORDER — HYDROMORPHONE HCL 1 MG/ML IJ SOLN
INTRAMUSCULAR | Status: AC
  Administered 2019-07-05: 0.5 mg via INTRAVENOUS
  Filled 2019-07-05: qty 1

## 2019-07-05 MED ORDER — OXYCODONE HCL 5 MG PO TABS
5.0000 mg | ORAL_TABLET | ORAL | Status: DC | PRN
  Administered 2019-07-05 – 2019-07-06 (×4): 10 mg via ORAL
  Filled 2019-07-05: qty 1
  Filled 2019-07-05 (×5): qty 2

## 2019-07-05 MED ORDER — BACLOFEN 10 MG PO TABS: 10.0000 mg | ORAL_TABLET | Freq: Three times a day (TID) | ORAL | 0 refills | Status: DC | PRN

## 2019-07-05 MED ORDER — ALBUTEROL SULFATE HFA 108 (90 BASE) MCG/ACT IN AERS: 2.0000 | INHALATION_SPRAY | Freq: Four times a day (QID) | RESPIRATORY_TRACT | Status: DC | PRN

## 2019-07-05 MED ORDER — ALPRAZOLAM 1 MG PO TABS: 1.0000 mg | ORAL_TABLET | Freq: Two times a day (BID) | ORAL | Status: DC | PRN

## 2019-07-05 MED ORDER — ALBUTEROL SULFATE (2.5 MG/3ML) 0.083% IN NEBU
INHALATION_SOLUTION | RESPIRATORY_TRACT | Status: AC
  Filled 2019-07-05: qty 3

## 2019-07-05 MED ORDER — MAGNESIUM CITRATE PO SOLN: 1.0000 | Freq: Once | ORAL | Status: DC | PRN

## 2019-07-05 MED ORDER — ONDANSETRON HCL 4 MG PO TABS: 4.0000 mg | ORAL_TABLET | Freq: Four times a day (QID) | ORAL | Status: DC | PRN

## 2019-07-05 MED ORDER — LACTATED RINGERS IV SOLN
INTRAVENOUS | Status: DC
  Administered 2019-07-05 (×2): via INTRAVENOUS

## 2019-07-05 MED ORDER — FENTANYL CITRATE (PF) 100 MCG/2ML IJ SOLN
INTRAMUSCULAR | Status: AC
  Filled 2019-07-05: qty 2

## 2019-07-05 MED ORDER — KETOROLAC TROMETHAMINE 15 MG/ML IJ SOLN
7.5000 mg | Freq: Four times a day (QID) | INTRAMUSCULAR | Status: AC
  Administered 2019-07-05 – 2019-07-06 (×4): 7.5 mg via INTRAVENOUS
  Filled 2019-07-05 (×3): qty 1

## 2019-07-05 MED ORDER — MIDAZOLAM HCL 2 MG/2ML IJ SOLN
INTRAMUSCULAR | Status: DC | PRN
  Administered 2019-07-05 (×2): 1 mg via INTRAVENOUS

## 2019-07-05 MED ORDER — SODIUM CHLORIDE 0.9 % IR SOLN
Status: DC | PRN
  Administered 2019-07-05: 1000 mL

## 2019-07-05 MED ORDER — LIDOCAINE 2% (20 MG/ML) 5 ML SYRINGE
INTRAMUSCULAR | Status: DC | PRN
  Administered 2019-07-05: 60 mg via INTRAVENOUS

## 2019-07-05 MED ORDER — 0.9 % SODIUM CHLORIDE (POUR BTL) OPTIME
TOPICAL | Status: DC | PRN
  Administered 2019-07-05: 1000 mL

## 2019-07-05 MED ORDER — ASPIRIN 325 MG PO TABS: 325.0000 mg | ORAL_TABLET | Freq: Every day | ORAL | 0 refills | Status: AC

## 2019-07-05 MED ORDER — FENTANYL CITRATE (PF) 100 MCG/2ML IJ SOLN
INTRAMUSCULAR | Status: DC | PRN
  Administered 2019-07-05 (×5): 50 ug via INTRAVENOUS

## 2019-07-05 MED ORDER — ACETAMINOPHEN 500 MG PO TABS: 1000.0000 mg | ORAL_TABLET | Freq: Three times a day (TID) | ORAL | 0 refills | Status: AC

## 2019-07-05 MED ORDER — OXYCODONE HCL 5 MG/5ML PO SOLN: 5.0000 mg | Freq: Once | ORAL | Status: AC | PRN

## 2019-07-05 MED ORDER — POVIDONE-IODINE 10 % EX SWAB
2.0000 "application " | Freq: Once | CUTANEOUS | Status: AC
  Administered 2019-07-05: 2 via TOPICAL

## 2019-07-05 MED ORDER — OXYCODONE HCL 5 MG PO TABS: 5.0000 mg | ORAL_TABLET | ORAL | 0 refills | Status: AC | PRN

## 2019-07-05 MED ORDER — DEXAMETHASONE SODIUM PHOSPHATE 10 MG/ML IJ SOLN
INTRAMUSCULAR | Status: AC
  Filled 2019-07-05: qty 1

## 2019-07-05 MED ORDER — LIDOCAINE 2% (20 MG/ML) 5 ML SYRINGE
INTRAMUSCULAR | Status: AC
  Filled 2019-07-05: qty 5

## 2019-07-05 MED ORDER — ALBUTEROL SULFATE (2.5 MG/3ML) 0.083% IN NEBU
2.5000 mg | INHALATION_SOLUTION | Freq: Once | RESPIRATORY_TRACT | Status: AC
  Administered 2019-07-05: 2.5 mg via RESPIRATORY_TRACT

## 2019-07-05 MED ORDER — SODIUM CHLORIDE 0.9% FLUSH
INTRAVENOUS | Status: DC | PRN
  Administered 2019-07-05: 30 mL

## 2019-07-05 MED ORDER — MIDAZOLAM HCL 2 MG/2ML IJ SOLN
INTRAMUSCULAR | Status: AC
  Administered 2019-07-05: 1.5 mg
  Filled 2019-07-05: qty 2

## 2019-07-05 MED ORDER — ALBUTEROL SULFATE (2.5 MG/3ML) 0.083% IN NEBU
2.5000 mg | INHALATION_SOLUTION | Freq: Four times a day (QID) | RESPIRATORY_TRACT | Status: DC | PRN
  Administered 2019-07-06: 2.5 mg via RESPIRATORY_TRACT
  Filled 2019-07-05: qty 3

## 2019-07-05 MED ORDER — BACLOFEN 10 MG PO TABS
10.0000 mg | ORAL_TABLET | Freq: Three times a day (TID) | ORAL | Status: DC | PRN
  Administered 2019-07-05: 10 mg via ORAL
  Filled 2019-07-05 (×2): qty 1

## 2019-07-05 MED ORDER — SODIUM CHLORIDE (PF) 0.9 % IJ SOLN
INTRAMUSCULAR | Status: AC
  Filled 2019-07-05: qty 50

## 2019-07-05 MED ORDER — MIDAZOLAM HCL 2 MG/2ML IJ SOLN
INTRAMUSCULAR | Status: AC
  Filled 2019-07-05: qty 2

## 2019-07-05 MED ORDER — VANCOMYCIN HCL 1000 MG IV SOLR
INTRAVENOUS | Status: DC | PRN
  Administered 2019-07-05: 08:00:00 1000 mg via INTRAVENOUS

## 2019-07-05 MED ORDER — CALCIUM CARBONATE ANTACID 500 MG PO CHEW
1.0000 | CHEWABLE_TABLET | Freq: Two times a day (BID) | ORAL | Status: DC | PRN
  Administered 2019-07-05 – 2019-07-06 (×2): 200 mg via ORAL
  Filled 2019-07-05 (×2): qty 1

## 2019-07-05 MED ORDER — SENNA 8.6 MG PO TABS
1.0000 | ORAL_TABLET | Freq: Two times a day (BID) | ORAL | Status: DC
  Administered 2019-07-05 – 2019-07-06 (×3): 8.6 mg via ORAL
  Filled 2019-07-05 (×3): qty 1

## 2019-07-05 MED ORDER — STERILE WATER FOR IRRIGATION IR SOLN
Status: DC | PRN
  Administered 2019-07-05: 2000 mL

## 2019-07-05 MED ORDER — ONDANSETRON HCL 4 MG/2ML IJ SOLN: 4.0000 mg | Freq: Four times a day (QID) | INTRAMUSCULAR | Status: DC | PRN

## 2019-07-05 MED ORDER — GABAPENTIN 400 MG PO CAPS: 400.0000 mg | ORAL_CAPSULE | Freq: Four times a day (QID) | ORAL | Status: DC | PRN

## 2019-07-05 MED ORDER — RACEPINEPHRINE HCL 2.25 % IN NEBU
INHALATION_SOLUTION | RESPIRATORY_TRACT | Status: AC
  Filled 2019-07-05: qty 0.5

## 2019-07-05 MED ORDER — PANTOPRAZOLE SODIUM 40 MG IV SOLR
40.0000 mg | Freq: Every day | INTRAVENOUS | Status: DC | PRN
  Administered 2019-07-05: 40 mg via INTRAVENOUS
  Filled 2019-07-05: qty 40

## 2019-07-05 MED ORDER — SORBITOL 70 % SOLN
30.0000 mL | Freq: Every day | Status: DC | PRN
  Filled 2019-07-05: qty 30

## 2019-07-05 MED ORDER — LACTATED RINGERS IV SOLN
INTRAVENOUS | Status: DC
  Administered 2019-07-05 – 2019-07-06 (×2): via INTRAVENOUS

## 2019-07-05 MED ORDER — LABETALOL HCL 5 MG/ML IV SOLN
INTRAVENOUS | Status: AC
  Filled 2019-07-05: qty 4

## 2019-07-05 MED ORDER — PHENYLEPHRINE 40 MCG/ML (10ML) SYRINGE FOR IV PUSH (FOR BLOOD PRESSURE SUPPORT)
PREFILLED_SYRINGE | INTRAVENOUS | Status: AC
  Filled 2019-07-05: qty 10

## 2019-07-05 MED ORDER — EPHEDRINE 5 MG/ML INJ
INTRAVENOUS | Status: AC
  Filled 2019-07-05: qty 10

## 2019-07-05 MED ORDER — VANCOMYCIN HCL IN DEXTROSE 1-5 GM/200ML-% IV SOLN: 1000.0000 mg | Freq: Two times a day (BID) | INTRAVENOUS | Status: DC

## 2019-07-05 MED ORDER — DEXAMETHASONE SODIUM PHOSPHATE 10 MG/ML IJ SOLN
INTRAMUSCULAR | Status: DC | PRN
  Administered 2019-07-05: 10 mg via INTRAVENOUS

## 2019-07-05 MED ORDER — OMEPRAZOLE 20 MG PO CPDR: 20.0000 mg | DELAYED_RELEASE_CAPSULE | Freq: Every day | ORAL | 0 refills | Status: DC

## 2019-07-05 MED ORDER — SIMVASTATIN 20 MG PO TABS
20.0000 mg | ORAL_TABLET | ORAL | Status: DC
  Administered 2019-07-06: 20 mg via ORAL
  Filled 2019-07-05: qty 1

## 2019-07-05 MED ORDER — LACTATED RINGERS IV BOLUS
250.0000 mL | Freq: Two times a day (BID) | INTRAVENOUS | Status: DC | PRN
  Administered 2019-07-05: 250 mL via INTRAVENOUS

## 2019-07-05 MED ORDER — PROPOFOL 500 MG/50ML IV EMUL
INTRAVENOUS | Status: AC
  Filled 2019-07-05: qty 50

## 2019-07-05 MED ORDER — ASPIRIN 325 MG PO TABS
325.0000 mg | ORAL_TABLET | Freq: Every day | ORAL | Status: DC
  Administered 2019-07-06: 325 mg via ORAL
  Filled 2019-07-05: qty 1

## 2019-07-05 MED ORDER — METHOCARBAMOL 500 MG IVPB - SIMPLE MED
INTRAVENOUS | Status: AC
  Filled 2019-07-05: qty 50

## 2019-07-05 MED ORDER — HYDROMORPHONE HCL 1 MG/ML IJ SOLN
INTRAMUSCULAR | Status: AC
  Filled 2019-07-05: qty 1

## 2019-07-05 MED ORDER — DIPHENHYDRAMINE HCL 12.5 MG/5ML PO ELIX: 12.5000 mg | ORAL_SOLUTION | ORAL | Status: DC | PRN

## 2019-07-05 MED ORDER — OXYCODONE HCL 5 MG PO TABS
ORAL_TABLET | ORAL | Status: AC
  Filled 2019-07-05: qty 1

## 2019-07-05 MED ORDER — ONDANSETRON HCL 4 MG PO TABS: 4.0000 mg | ORAL_TABLET | Freq: Three times a day (TID) | ORAL | 0 refills | Status: DC | PRN

## 2019-07-05 MED ORDER — TRANEXAMIC ACID-NACL 1000-0.7 MG/100ML-% IV SOLN
1000.0000 mg | INTRAVENOUS | Status: AC
  Administered 2019-07-05: 1000 mg via INTRAVENOUS
  Filled 2019-07-05: qty 100

## 2019-07-05 MED ORDER — ACETAMINOPHEN 500 MG PO TABS
1000.0000 mg | ORAL_TABLET | Freq: Once | ORAL | Status: AC
  Administered 2019-07-05: 1000 mg via ORAL
  Filled 2019-07-05: qty 2

## 2019-07-05 MED ORDER — HYDROMORPHONE HCL 1 MG/ML IJ SOLN: 0.5000 mg | INTRAMUSCULAR | Status: DC | PRN

## 2019-07-05 MED ORDER — VANCOMYCIN HCL IN DEXTROSE 1-5 GM/200ML-% IV SOLN
1000.0000 mg | Freq: Two times a day (BID) | INTRAVENOUS | Status: AC
  Administered 2019-07-05: 1000 mg via INTRAVENOUS
  Filled 2019-07-05: qty 200

## 2019-07-05 MED ORDER — MIDAZOLAM HCL 2 MG/2ML IJ SOLN: 2.0000 mg | Freq: Once | INTRAMUSCULAR | Status: DC

## 2019-07-05 MED ORDER — RACEPINEPHRINE HCL 2.25 % IN NEBU
0.5000 mL | INHALATION_SOLUTION | Freq: Once | RESPIRATORY_TRACT | Status: AC
  Administered 2019-07-05: 0.5 mL via RESPIRATORY_TRACT

## 2019-07-05 MED ORDER — KETOROLAC TROMETHAMINE 15 MG/ML IJ SOLN
INTRAMUSCULAR | Status: AC
  Filled 2019-07-05: qty 1

## 2019-07-05 MED ORDER — POLYETHYLENE GLYCOL 3350 17 G PO PACK: 17.0000 g | PACK | Freq: Every day | ORAL | Status: DC | PRN

## 2019-07-05 MED ORDER — HYDROMORPHONE HCL 1 MG/ML IJ SOLN
0.2500 mg | INTRAMUSCULAR | Status: DC | PRN
  Administered 2019-07-05 (×4): 0.5 mg via INTRAVENOUS

## 2019-07-05 SURGICAL SUPPLY — 56 items
BLADE HEX COATED 2.75 (ELECTRODE) ×3 IMPLANT
BLADE SAG 18X100X1.27 (BLADE) ×3 IMPLANT
BLADE SAGITTAL 25.0X1.37X90 (BLADE) ×2 IMPLANT
BLADE SAGITTAL 25.0X1.37X90MM (BLADE) ×1
BLADE SURG 15 STRL LF DISP TIS (BLADE) ×1 IMPLANT
BLADE SURG 15 STRL SS (BLADE) ×3
BLADE SURG SZ10 CARB STEEL (BLADE) ×6 IMPLANT
BNDG CMPR MED 10X6 ELC LF (GAUZE/BANDAGES/DRESSINGS) ×1
BNDG ELASTIC 6X10 VLCR STRL LF (GAUZE/BANDAGES/DRESSINGS) ×3 IMPLANT
BOWL SMART MIX CTS (DISPOSABLE) IMPLANT
BSPLAT TIB 5 KN TRITANIUM (Knees) ×1 IMPLANT
CLOSURE STERI-STRIP 1/2X4 (GAUZE/BANDAGES/DRESSINGS) ×1
CLSR STERI-STRIP ANTIMIC 1/2X4 (GAUZE/BANDAGES/DRESSINGS) ×2 IMPLANT
COVER SURGICAL LIGHT HANDLE (MISCELLANEOUS) ×3 IMPLANT
COVER WAND RF STERILE (DRAPES) IMPLANT
CUFF TOURN SGL QUICK 34 (TOURNIQUET CUFF) ×3
CUFF TRNQT CYL 34X4.125X (TOURNIQUET CUFF) ×1 IMPLANT
DECANTER SPIKE VIAL GLASS SM (MISCELLANEOUS) ×3 IMPLANT
DRAPE U-SHAPE 47X51 STRL (DRAPES) ×3 IMPLANT
DRSG MEPILEX BORDER 4X12 (GAUZE/BANDAGES/DRESSINGS) ×3 IMPLANT
DRSG MEPILEX BORDER 4X8 (GAUZE/BANDAGES/DRESSINGS) ×2 IMPLANT
DURAPREP 26ML APPLICATOR (WOUND CARE) ×6 IMPLANT
FEMORAL POSTERIOR SZ5 LFT (Femur) IMPLANT
GLOVE BIO SURGEON STRL SZ7.5 (GLOVE) ×6 IMPLANT
GLOVE BIOGEL PI IND STRL 8 (GLOVE) ×2 IMPLANT
GLOVE BIOGEL PI INDICATOR 8 (GLOVE) ×4
GOWN STRL REUS W/ TWL LRG LVL3 (GOWN DISPOSABLE) ×2 IMPLANT
GOWN STRL REUS W/TWL LRG LVL3 (GOWN DISPOSABLE) ×6
HANDPIECE INTERPULSE COAX TIP (DISPOSABLE) ×3
HOLDER FOLEY CATH W/STRAP (MISCELLANEOUS) IMPLANT
IMMOBILIZER KNEE 20 (SOFTGOODS) ×3
IMMOBILIZER KNEE 20 THIGH 36 (SOFTGOODS) IMPLANT
IMMOBILIZER KNEE 22 UNIV (SOFTGOODS) ×3 IMPLANT
INSERT TIB BEARING PS 5 9 (Miscellaneous) ×2 IMPLANT
KIT TURNOVER KIT A (KITS) IMPLANT
KNEE PATELLA ASYMMETRIC 10X32 (Knees) ×2 IMPLANT
KNEE TIBIAL COMPONENT SZ5 (Knees) ×2 IMPLANT
MANIFOLD NEPTUNE II (INSTRUMENTS) ×3 IMPLANT
NS IRRIG 1000ML POUR BTL (IV SOLUTION) ×3 IMPLANT
PACK ICE MAXI GEL EZY WRAP (MISCELLANEOUS) ×3 IMPLANT
PACK TOTAL KNEE CUSTOM (KITS) ×3 IMPLANT
PENCIL SMOKE EVACUATOR (MISCELLANEOUS) ×2 IMPLANT
PIN FLUTED HEDLESS FIX 3.5X1/8 (PIN) ×2 IMPLANT
POSTERIOR FEMORAL SZ5 LFT (Femur) ×3 IMPLANT
PROTECTOR NERVE ULNAR (MISCELLANEOUS) ×3 IMPLANT
SET HNDPC FAN SPRY TIP SCT (DISPOSABLE) ×1 IMPLANT
SUT MNCRL AB 4-0 PS2 18 (SUTURE) ×3 IMPLANT
SUT VIC AB 0 CT1 36 (SUTURE) ×3 IMPLANT
SUT VIC AB 1 CT1 36 (SUTURE) ×6 IMPLANT
SUT VIC AB 2-0 CT1 27 (SUTURE) ×6
SUT VIC AB 2-0 CT1 TAPERPNT 27 (SUTURE) ×1 IMPLANT
TAPE STRIPS DRAPE STRL (GAUZE/BANDAGES/DRESSINGS) ×2 IMPLANT
TRAY FOLEY MTR SLVR 14FR STAT (SET/KITS/TRAYS/PACK) IMPLANT
TRAY FOLEY MTR SLVR 16FR STAT (SET/KITS/TRAYS/PACK) IMPLANT
WRAP KNEE MAXI GEL POST OP (GAUZE/BANDAGES/DRESSINGS) ×2 IMPLANT
YANKAUER SUCT BULB TIP 10FT TU (MISCELLANEOUS) ×3 IMPLANT

## 2019-07-05 NOTE — Op Note (Signed)
DATE OF SURGERY:  07/05/2019 TIME: 8:58 AM  PATIENT NAME:  Mary Wise   AGE: 68 y.o.    PRE-OPERATIVE DIAGNOSIS:  OSTEOARTHRITIS LEFT KNEE  POST-OPERATIVE DIAGNOSIS:  Same  PROCEDURE:  Procedure(s): TOTAL KNEE ARTHROPLASTY   SURGEON:  Renette Butters, MD   ASSISTANT:  Roxan Hockey, PA-C, he was present and scrubbed throughout the case, critical for completion in a timely fashion, and for retraction, instrumentation, and closure.    OPERATIVE IMPLANTS: [employer]ker Triathlon Posterior Stabilized. Press fit knee  Femur size 5, Tibia size 5, Patella size 32 3-peg oval button, with a 9 mm polyethylene insert.   PREOPERATIVE INDICATIONS:  Mary Wise is a 68 y.o. year old female with end stage bone on bone degenerative arthritis of the knee who failed conservative treatment, including injections, antiinflammatories, activity modification, and assistive devices, and had significant impairment of their activities of daily living, and elected for Total Knee Arthroplasty.   The risks, benefits, and alternatives were discussed at length including but not limited to the risks of infection, bleeding, nerve injury, stiffness, blood clots, the need for revision surgery, cardiopulmonary complications, among others, and they were willing to proceed.   OPERATIVE DESCRIPTION:  The patient was brought to the operative room and placed in a supine position.  General anesthesia was administered.  IV antibiotics were given.  The lower extremity was prepped and draped in the usual sterile fashion.  Time out was performed.  The leg was elevated and exsanguinated and the tourniquet was inflated.  Anterior approach was performed.  The patella was everted and osteophytes were removed.  The anterior horn of the medial and lateral meniscus was removed.   The distal femur was opened with the drill and the intramedullary distal femoral cutting jig was utilized, set at 5 degrees resecting 10 mm off the distal  femur.  Care was taken to protect the collateral ligaments.  The distal femoral sizing jig was applied, taking care to avoid notching.  Then the 4-in-1 cutting jig was applied and the anterior and posterior femur was cut, along with the chamfer cuts.  All posterior osteophytes were removed.  The flexion gap was then measured and was symmetric with the extension gap.  Then the extramedullary tibial cutting jig was utilized making the appropriate cut using the anterior tibial crest as a reference building in appropriate posterior slope.  Care was taken during the cut to protect the medial and collateral ligaments.  The proximal tibia was removed along with the posterior horns of the menisci.  The PCL was sacrificed.    The extensor gap was measured and was approximately 40mm.    I completed the distal femoral preparation using the appropriate jig to prepare the box.  The patella was then measured, and cut with the saw.    The proximal tibia sized and prepared accordingly with the reamer and the punch, and then all components were trialed with the above sized poly insert.  The knee was found to have excellent balance and full motion.    The above named components were then impacted into place and Poly tibial piece and patella were inserted.  I was very happy with his stability and ROM  I performed a periarticular injection with marcaine and toradol  The knee was easily taken through a range of motion and the patella tracked well and the knee irrigated copiously and the parapatellar and subcutaneous tissue closed with vicryl, and monocryl with steri strips for the skin.  The incision was dressed with sterile gauze and the tourniquet released and the patient was awakened and returned to the PACU in stable and satisfactory condition.  There were no complications.  Total tourniquet time was roughly 60 minutes.   POSTOPERATIVE PLAN: post op Abx, DVT px: SCD's, TED's, Early ambulation and chemical  px

## 2019-07-05 NOTE — Care Plan (Signed)
Ortho Bundle Case Management Note  Patient Details  Name: Mary Wise MRN: [id number] Date of Birth: [birth year]    Met with patient prior to surgery in the office for H&P visit. She plans to discharge to home with her daughter Lysbeth Galas - [address], [place], ) She will go to OPPT at [facility]. Arrangements have been made. Equipment ordered. Patient and MD in agreement with plan. Choice offered.                  DME Arranged:  Bedside commode, Walker rolling, CPM DME Agency:  [employer]  HH Arranged:    [place] Agency:     Additional Comments: Please contact me with any questions of if this plan should need to change.  Ladell Heads,  [place] [facility]ist  [phone] 07/05/2019, 9:09 AM

## 2019-07-05 NOTE — Progress Notes (Signed)
Prudencio Burly III, PA paged regarding the pt's c/o indigestion and request for a PRN med.

## 2019-07-05 NOTE — Interval H&P Note (Signed)
I participated in the care of this patient and agree with the above history, physical and evaluation. I performed a review of the history and a physical exam as detailed   Mikiyah Glasner Daniel Yehoshua Vitelli MD  

## 2019-07-05 NOTE — Transfer of Care (Signed)
Immediate Anesthesia Transfer of Care Note  Patient: Mary Wise  Procedure(s) Performed: Procedure(s): TOTAL KNEE ARTHROPLASTY (Left)  Patient Location: PACU  Anesthesia Type:General  Level of Consciousness: Alert, Awake, Oriented  Airway & Oxygen Therapy: Patient Spontanous Breathing  Post-op Assessment: Report given to RN  Post vital signs: Reviewed and stable  Last Vitals:  Vitals:   07/05/19 0607  BP: (!) 143/86  Pulse: 81  Resp: 18  Temp: 36.4 C  SpO2: [id number]    Complications: No apparent anesthesia complications

## 2019-07-05 NOTE — Evaluation (Signed)
Physical Therapy Evaluation Patient Details Name: Mary Wise MRN: [id number] DOB: 1951-05-18 Today's Date: 07/05/2019   History of Present Illness  Patient is 68 y.o. female s/p Lt TKA on 07/05/19 with PMH significant for lipoma of Lt shoulder, breast cancer, asthma, anxiety, osteopenia, seizures.    Clinical Impression  Mary Wise is a 68 y.o. female POD 0 s/p Lt TKA. Patient reports independence with mobility at baseline. Patient is now limited by functional impairments (see PT problem list below) and requires min/mod assist for transfers and gait with RW. Patient was able to ambulate ~60 feet with RW and min assist with cues for RW management. Patient instructed in exercise to facilitate ROM and circulation. Patient will benefit from continued skilled PT interventions to address impairments and progress towards PLOF. Acute PT will follow to progress mobility and stair training in preparation for safe discharge home.    Follow Up Recommendations Follow surgeon's recommendation for DC plan and follow-up therapies    Equipment Recommendations  None recommended by PT(pt has recieved all equipment at home)    Recommendations for Other Services       Precautions / Restrictions Precautions Precautions: Fall Restrictions Weight Bearing Restrictions: No LLE Weight Bearing: Weight bearing as tolerated      Mobility  Bed Mobility Overal bed mobility: Needs Assistance Bed Mobility: Supine to Sit     Supine to sit: HOB elevated;Min assist     General bed mobility comments: cues for sequencing and use of bed rails, assist to pivot and bring LE's to EOB, pt slow to move  Transfers Overall transfer level: Needs assistance Equipment used: Rolling walker (2 wheeled) Transfers: Sit to/from Stand Sit to Stand: From elevated surface;Min assist;Mod assist         General transfer comment: cues for safe hand placement on RW, assist to initiate power up and steady with rising; pt  unable to complete power up from low surface on EOB. Pt required min-mod assist from elevated EOB, recliner, and toilet.  Ambulation/Gait Ambulation/Gait assistance: Min assist Gait Distance (Feet): 60 Feet Assistive device: Rolling walker (2 wheeled) Gait Pattern/deviations: Step-to pattern;Decreased stride length;Decreased step length - right;Decreased stance time - left Gait velocity: decreased   General Gait Details: verbal cues and assist to maintain safe proximity to RW and for sequencing step pattern (RW, Lt, Rt). no overt LOB noted. Pt ambulated ~ 60' in hallway and then additional 20 to get in/out of bathroom.  Stairs            Wheelchair Mobility    Modified Rankin (Stroke Patients Only)       Balance Overall balance assessment: Needs assistance Sitting-balance support: No upper extremity supported;Feet supported Sitting balance-Leahy Scale: Good     Standing balance support: During functional activity;Bilateral upper extremity supported Standing balance-Leahy Scale: Poor          Pertinent Vitals/Pain Pain Assessment: 0-10 Pain Score: 5  Pain Location: Lt knee Pain Descriptors / Indicators: Aching;Sore Pain Intervention(s): Limited activity within patient's tolerance;Monitored during session;Repositioned;Ice applied    Home Living Family/patient expects to be discharged to:: Private residence(pt lives alone in ALF but plans to discharge to daughters home) Living Arrangements: Alone Available Help at Discharge: Family;Available 24 hours/day(grandson and daughter) Type of Home: House Home Access: Stairs to enter;Level entry Entrance Stairs-Rails: Right;Left Entrance Stairs-Number of Steps: 4 steps at daughters house, level entry at her ALF Home Layout: One level Home Equipment: Environmental consultant - 2 wheels;Bedside commode;Cane - single point;Grab bars -  tub/shower;Grab bars - toilet Additional Comments: pt's daughter has walk in shower without grab bars, she has  tub shower with grab bars at her home    Prior Function Level of Independence: Independent with assistive device(s)         Comments: has been using SPC     Hand Dominance   Dominant Hand: Right    Extremity/Trunk Assessment   Upper Extremity Assessment Upper Extremity Assessment: Overall WFL for tasks assessed    Lower Extremity Assessment Lower Extremity Assessment: Generalized weakness;LLE deficits/detail LLE Deficits / Details: pt with poor knee mobility for active flexion or extension, unable to perform SLR, no buckling in WB LLE Sensation: WNL LLE Coordination: WNL    Cervical / Trunk Assessment Cervical / Trunk Assessment: Normal  Communication   Communication: No difficulties  Cognition Arousal/Alertness: Awake/alert Behavior During Therapy: WFL for tasks assessed/performed Overall Cognitive Status: Within Functional Limits for tasks assessed           General Comments      Exercises Total Joint Exercises Ankle Circles/Pumps: AROM;15 reps;Seated;Both Quad Sets: AROM;Seated;Left;10 reps Heel Slides: AAROM;10 reps;Seated;Left   Assessment/Plan    PT Assessment Patient needs continued PT services  PT Problem List Decreased strength;Decreased balance;Decreased range of motion;Decreased mobility;Decreased activity tolerance;Decreased knowledge of use of DME       PT Treatment Interventions DME instruction;Functional mobility training;Balance training;Patient/family education;Modalities;Gait training;Therapeutic activities;Therapeutic exercise;Stair training    PT Goals (Current goals can be found in the Care Plan section)  Acute Rehab PT Goals Patient Stated Goal: improve knee ROM and be able "to wear nice pretty shoes to church instead of sneakers" PT Goal Formulation: With patient Time For Goal Achievement: 07/12/19 Potential to Achieve Goals: Good    Frequency 7X/week    AM-PAC PT "6 Clicks" Mobility  Outcome Measure Help needed turning from  your back to your side while in a flat bed without using bedrails?: A Little Help needed moving from lying on your back to sitting on the side of a flat bed without using bedrails?: A Little Help needed moving to and from a bed to a chair (including a wheelchair)?: A Little Help needed standing up from a chair using your arms (e.g., wheelchair or bedside chair)?: A Little Help needed to walk in hospital room?: A Little Help needed climbing 3-5 steps with a railing? : A Little 6 Click Score: 18    End of Session Equipment Utilized During Treatment: Gait belt Activity Tolerance: Patient tolerated treatment well Patient left: in chair;with call bell/phone within reach;with chair alarm set Nurse Communication: Mobility status PT Visit Diagnosis: Muscle weakness (generalized) (M62.81);Difficulty in walking, not elsewhere classified (R26.2)    Time: [id number] PT Time Calculation (min) (ACUTE ONLY): 40 min   Charges:   PT Evaluation $PT Eval Low Complexity: 1 Low PT Treatments $Gait Training: 8-22 mins $Therapeutic Exercise: 8-22 mins        Kipp Brood, PT, DPT Physical Therapist with [facility]  07/05/2019 5:33 PM

## 2019-07-05 NOTE — Anesthesia Procedure Notes (Signed)
Anesthesia Regional Block: Adductor canal block   Pre-Anesthetic Checklist: ,, timeout performed, Correct Patient, Correct Site, Correct Laterality, Correct Procedure, Correct Position, site marked, Risks and benefits discussed,  Surgical consent,  Pre-op evaluation,  At surgeon's request and post-op pain management  Laterality: Left  Prep: chloraprep       Needles:  Injection technique: Single-shot  Needle Type: Echogenic Needle     Needle Length: 9cm  Needle Gauge: 21     Additional Needles:   Narrative:  Start time: 07/05/2019 6:50 AM End time: 07/05/2019 6:56 AM Injection made incrementally with aspirations every 5 mL.  Performed by: Personally  Anesthesiologist: Albertha Ghee, MD  Additional Notes: Pt tolerated the procedure well.

## 2019-07-05 NOTE — Anesthesia Preprocedure Evaluation (Signed)
Anesthesia Evaluation  Patient identified by MRN, date of birth, ID band Patient awake    Reviewed: Allergy & Precautions, H&P , NPO status , Patient's Chart, lab work & pertinent test results  Airway Mallampati: II   Neck ROM: full    Dental   Pulmonary asthma ,    breath sounds clear to auscultation       Cardiovascular hypertension,  Rhythm:regular Rate:Normal     Neuro/Psych  Headaches, Seizures -,  PSYCHIATRIC DISORDERS Anxiety  Neuromuscular disease    GI/Hepatic   Endo/Other    Renal/GU      Musculoskeletal  (+) Arthritis ,   Abdominal   Peds  Hematology   Anesthesia Other Findings   Reproductive/Obstetrics H/o breast CA                             Anesthesia Physical Anesthesia Plan  ASA: II  Anesthesia Plan: General   Post-op Pain Management:  Regional for Post-op pain   Induction: Intravenous  PONV Risk Score and Plan: 3 and Ondansetron, Dexamethasone, Midazolam and Treatment may vary due to age or medical condition  Airway Management Planned: LMA  Additional Equipment:   Intra-op Plan:   Post-operative Plan:   Informed Consent: I have reviewed the patients History and Physical, chart, labs and discussed the procedure including the risks, benefits and alternatives for the proposed anesthesia with the patient or authorized representative who has indicated his/her understanding and acceptance.       Plan Discussed with: CRNA, Surgeon and Anesthesiologist  Anesthesia Plan Comments:         Anesthesia Quick Evaluation

## 2019-07-05 NOTE — Anesthesia Procedure Notes (Signed)
Procedure Name: LMA Insertion Date/Time: 07/05/2019 7:42 AM Performed by: Gerald Leitz, CRNA Pre-anesthesia Checklist: Patient identified, Patient being monitored, Timeout performed, Emergency Drugs available and Suction available Patient Re-evaluated:Patient Re-evaluated prior to induction Oxygen Delivery Method: Circle system utilized Preoxygenation: Pre-oxygenation with 100% oxygen Induction Type: IV induction Ventilation: Mask ventilation without difficulty LMA: LMA inserted LMA Size: 4.0 Tube type: Oral Number of attempts: 1 Placement Confirmation: positive ETCO2 and breath sounds checked- equal and bilateral Tube secured with: Tape Dental Injury: Teeth and Oropharynx as per pre-operative assessment

## 2019-07-05 NOTE — Progress Notes (Signed)
Orthopedic Tech Progress Note Patient Details:  Mary Wise 1951/03/05 [id number] Per MD, pt did not need to put CPM ON because she is getting ready to go home.       Ladell Pier [facility] 07/05/2019, 11:59 AM

## 2019-07-05 NOTE — Discharge Instructions (Signed)

## 2019-07-06 ENCOUNTER — Observation Stay (HOSPITAL_COMMUNITY): Payer: Medicare Other

## 2019-07-06 ENCOUNTER — Encounter (HOSPITAL_COMMUNITY): Payer: Self-pay | Admitting: Orthopedic Surgery

## 2019-07-06 DIAGNOSIS — R0602 Shortness of breath: Secondary | ICD-10-CM | POA: Diagnosis not present

## 2019-07-06 DIAGNOSIS — M170 Bilateral primary osteoarthritis of knee: Secondary | ICD-10-CM | POA: Diagnosis not present

## 2019-07-06 NOTE — Plan of Care (Signed)

## 2019-07-06 NOTE — Progress Notes (Signed)
Physical Therapy Treatment Patient Details Name: Mary Wise MRN: [id number] DOB: [birth year] Today's Date: 07/06/2019    History of Present Illness Patient is 68 y.o. female s/p Lt TKA on 07/05/19 with PMH significant for lipoma of Lt shoulder, breast cancer, asthma, anxiety, osteopenia, seizures.    PT Comments    Pt motivated and progressing well with mobility but with repeated cues to slow down for safety.  This date, pt ambulating increased distance in halls, negotiated stairs and performed therex program with assist.   Follow Up Recommendations  Follow surgeon's recommendation for DC plan and follow-up therapies     Equipment Recommendations  None recommended by PT    Recommendations for Other Services       Precautions / Restrictions Precautions Precautions: Fall Restrictions Weight Bearing Restrictions: No LLE Weight Bearing: Weight bearing as tolerated    Mobility  Bed Mobility               General bed mobility comments: Pt up in chair and requests back to same  Transfers Overall transfer level: Needs assistance Equipment used: Rolling walker (2 wheeled) Transfers: Sit to/from Stand Sit to Stand: Min guard         General transfer comment: cues for LE management and use of UEs to self assist  Ambulation/Gait Ambulation/Gait assistance: Min assist;Min guard Gait Distance (Feet): 200 Feet Assistive device: Rolling walker (2 wheeled) Gait Pattern/deviations: Step-to pattern;Decreased step length - right;Decreased step length - left;Shuffle;Trunk flexed     General Gait Details: cues for posture, pace, position from RW   Stairs Stairs: Yes Stairs assistance: Min assist Stair Management: Two rails;Step to pattern;Forwards Number of Stairs: 8 General stair comments: 2 steps x 4 with cues for sequence and foot placement   Wheelchair Mobility    Modified Rankin (Stroke Patients Only)       Balance Overall balance assessment: Needs  assistance Sitting-balance support: No upper extremity supported;Feet supported Sitting balance-Leahy Scale: Good     Standing balance support: During functional activity;Bilateral upper extremity supported Standing balance-Leahy Scale: Poor                              Cognition Arousal/Alertness: Awake/alert Behavior During Therapy: WFL for tasks assessed/performed Overall Cognitive Status: Within Functional Limits for tasks assessed                                        Exercises Total Joint Exercises Ankle Circles/Pumps: AROM;15 reps;Seated;Both Quad Sets: AROM;Seated;Left;10 reps Heel Slides: AAROM;Left;15 reps;Supine Straight Leg Raises: AROM;Left;20 reps;Supine    General Comments        Pertinent Vitals/Pain Pain Assessment: 0-10 Pain Score: 5  Pain Location: Lt knee Pain Descriptors / Indicators: Aching;Sore Pain Intervention(s): Limited activity within patient's tolerance;Monitored during session;Premedicated before session;Ice applied    Home Living                      Prior Function            PT Goals (current goals can now be found in the care plan section) Acute Rehab PT Goals Patient Stated Goal: improve knee ROM and be able "to wear nice pretty shoes to church instead of sneakers" PT Goal Formulation: With patient Time For Goal Achievement: 07/12/19 Potential to Achieve Goals: Good Progress towards PT goals: Progressing toward  goals    Frequency    7X/week      PT Plan Current plan remains appropriate    Co-evaluation              AM-PAC PT "6 Clicks" Mobility   Outcome Measure  Help needed turning from your back to your side while in a flat bed without using bedrails?: A Little Help needed moving from lying on your back to sitting on the side of a flat bed without using bedrails?: A Little Help needed moving to and from a bed to a chair (including a wheelchair)?: A Little Help needed  standing up from a chair using your arms (e.g., wheelchair or bedside chair)?: A Little Help needed to walk in hospital room?: A Little Help needed climbing 3-5 steps with a railing? : A Little 6 Click Score: 18    End of Session Equipment Utilized During Treatment: Gait belt Activity Tolerance: Patient tolerated treatment well Patient left: in chair;with call bell/phone within reach;with chair alarm set Nurse Communication: Mobility status PT Visit Diagnosis: Muscle weakness (generalized) (M62.81);Difficulty in walking, not elsewhere classified (R26.2)     Time: [id number] PT Time Calculation (min) (ACUTE ONLY): 38 min  Charges:  $Gait Training: 8-22 mins $Therapeutic Exercise: 8-22 mins $Therapeutic Activity: 8-22 mins                     [place] Pager [phone] Office [phone]    Jaion Lagrange 07/06/2019, 1:19 PM

## 2019-07-06 NOTE — Progress Notes (Signed)
Subjective: Patient reports pain as moderate, controlled.  Tolerating diet.  Urinating.  +Flatus.  No CP, SOB.  Good early mobilization with therapy.  History of asthma.  Required inhaler yesterday postoperatively.  Patient notes regular use of same.  Recommended discussing new/frequency with PCP after discharge to optimize long-term control.  Objective:   VITALS:   Vitals:   07/05/19 1755 07/05/19 2205 07/06/19 0158 07/06/19 0454  BP: (!) 146/85 127/84 108/66 137/83  Pulse: (!) 103 96 88 90  Resp: 18 16 16 16   Temp: 97.6 F (36.4 C) 98.1 F (36.7 C) 97.8 F (36.6 C) 98.1 F (36.7 C)  TempSrc: Oral Oral Oral Oral  SpO2: 98% 93% 97% 92%  Weight:      Height:       CBC Latest Ref Rng & Units 06/27/2019 03/21/2019 01/10/2019  WBC 4.0 - 10.5 K/uL 7.0 5.8 6.1  Hemoglobin 12.0 - 15.0 g/dL 13.6 12.9 14.5  Hematocrit 36.0 - 46.0 % 42.5 39.0 43.4  Platelets 150 - 400 K/uL 318 291 340   BMP Latest Ref Rng & Units 06/27/2019 05/18/2019 03/21/2019  Glucose 70 - 99 mg/dL 89 82 90  BUN 8 - 23 mg/dL 14 15 14   Creatinine 0.44 - 1.00 mg/dL 0.80 0.76 0.77  BUN/Creat Ratio 6 - 22 (calc) - NOT APPLICABLE -  Sodium [id number] - 145 mmol/L 140 141 140  Potassium 3.5 - 5.1 mmol/L 3.5 4.0 3.6  Chloride 98 - 111 mmol/L 104 103 105  CO2 22 - 32 mmol/L 26 29 26   Calcium 8.9 - 10.3 mg/dL 9.4 9.6 9.0   Intake/Output      12/01 0701 - 12/02 0700 12/02 0701 - 12/03 0700   P.O. 480    I.V. (mL/kg) 3725 (40.2)    IV Piggyback 1000    Total Intake(mL/kg) 5205 (56.2)    Urine (mL/kg/hr) 2900 (1.3)    Emesis/NG output 0    Stool 0    Blood 25    Total Output 2925    Net +2280         Urine Occurrence 0 x    Stool Occurrence 0 x    Emesis Occurrence 1 x       Physical Exam: General: NAD.  Upright in bed.  Calm, conversant. Resp: No increased wob Cardio: regular rate and rhythm ABD soft Neurologically intact MSK LLE: Neurovascularly intact Sensation intact distally Feet warm  Dorsiflexion/Plantar flexion intact Incision: dressing C/D/I   Assessment: 1 Day Post-Op  S/P Procedure(s) (LRB): TOTAL KNEE ARTHROPLASTY (Left) by Dr. Ernesta Amble. Percell Miller on 07/05/2019  Principal Problem:   Primary osteoarthritis of left knee Active Problems:   HTN, goal below 140/90   Anxiety   Family history of cancer   Asthma   Hyperlipemia   Primary osteoarthritis, status post left total knee arthroplasty Doing well postop day 1 Tolerating diet and voiding Pain controlled Mobilizing well  Plan: Up with therapy Incentive Spirometry Elevate and Apply ice CPM, bone foam  Weight Bearing: Weight Bearing as Tolerated (WBAT) LLE Dressings: Maintain Mepilex.   VTE prophylaxis: Aspirin, SCDs, ambulation Dispo: Home today    Patient's anticipated LOS is less than 2 midnights, meeting these requirements: - Lives within 1 hour of care - Has a competent adult at home to recover with post-op recover - NO history of  - Chronic pain requiring opiods  - Diabetes  - Coronary Artery Disease  - Heart failure  - Heart attack  - Stroke  -  DVT/VTE  - Cardiac arrhythmia  - Respiratory Failure/COPD  - Renal failure  - Anemia  - Advanced Liver disease    Prudencio Burly III, PA-C 07/06/2019, 7:32 AM

## 2019-07-06 NOTE — Anesthesia Postprocedure Evaluation (Signed)
Anesthesia Post Note  Patient: Mary Wise  Procedure(s) Performed: TOTAL KNEE ARTHROPLASTY (Left Knee)     Patient location during evaluation: PACU Anesthesia Type: General and Regional Level of consciousness: awake and alert Pain management: pain level controlled Vital Signs Assessment: post-procedure vital signs reviewed and stable Respiratory status: spontaneous breathing, nonlabored ventilation, respiratory function stable and patient connected to nasal cannula oxygen Cardiovascular status: blood pressure returned to baseline and stable Postop Assessment: no apparent nausea or vomiting Anesthetic complications: no    Last Vitals:  Vitals:   07/06/19 0158 07/06/19 0454  BP: 108/66 137/83  Pulse: 88 90  Resp: 16 16  Temp: 36.6 C 36.7 C  SpO2: 97% 92%    Last Pain:  Vitals:   07/06/19 0538  TempSrc:   PainSc: Asleep                 Gil Ingwersen S

## 2019-07-06 NOTE — Discharge Summary (Signed)
Discharge Summary  Patient ID: Mary Wise MRN: [id number] DOB/AGE: 67/01/1951 67 y.o.  Admit date: 07/05/2019 Discharge date: 07/06/2019  Admission Diagnoses:  Primary osteoarthritis of left knee  Discharge Diagnoses:  Principal Problem:   Primary osteoarthritis of left knee Active Problems:   HTN, goal below 140/90   Anxiety   Family history of cancer   Asthma   Hyperlipemia   Past Medical History:  Diagnosis Date  . Anxiety   . Asthma   . Cancer [facility])    left breast cancer  . Family history of cancer   . Genetic testing 02/01/2018   Multi-Cancer panel (83 genes) @ [facility] - No pathogenic mutations detected  . Hemorrhoids   . History of radiation therapy 05/27/18- 06/23/18   Left Breast 40.05 Gy in 15 fractions, left breast boost 10 Gy in 5 fractions.   . Lipoma of left shoulder   . Migraine   . Migraine   . Osteopenia   . 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 female who was admitted 07/05/2019 with a diagnosis of Primary osteoarthritis of left knee and went to the operating room on 07/05/2019 and underwent the above named procedures.    She was given perioperative antibiotics:  Anti-infectives (From admission, onward)   Start     Dose/Rate Route Frequency Ordered Stop   07/05/19 1800  vancomycin (VANCOCIN) IVPB 1000 mg/200 mL premix     1,000 mg 200 mL/hr over 60 Minutes Intravenous Every 12 hours 07/05/19 1359 07/05/19 1904   07/05/19 1015  vancomycin (VANCOCIN) IVPB 1000 mg/200 mL premix  Status:  Discontinued     1,000 mg 200 mL/hr over 60 Minutes Intravenous Every 12 hours 07/05/19 1002 07/05/19 1359   07/05/19 0600  vancomycin (VANCOCIN) IVPB 1000 mg/200 mL premix     1,000 mg 200 mL/hr over 60 Minutes Intravenous On call to O.R. 07/05/19 [id number] 07/05/19 0732    .  She  was given sequential compression devices, early ambulation, and aspirin for DVT prophylaxis.  She benefited maximally from the hospital stay and there were no complications.    Recent vital signs:  Vitals:   07/06/19 0158 07/06/19 0454  BP: 108/66 137/83  Pulse: 88 90  Resp: 16 16  Temp: 97.8 F (36.6 C) 98.1 F (36.7 C)  SpO2: 97% 92%    Recent laboratory studies:  Lab Results  Component Value Date   HGB 13.6 06/27/2019   HGB 12.9 03/21/2019   HGB 14.5 01/10/2019   Lab Results  Component Value Date   WBC 7.0 06/27/2019   PLT 318 06/27/2019   Lab Results  Component Value Date   INR 0.88 05/13/2018   Lab Results  Component Value Date   NA 140 06/27/2019   K 3.5 06/27/2019   CL 104 06/27/2019   CO2 26 06/27/2019   BUN 14 06/27/2019   CREATININE 0.80 06/27/2019   GLUCOSE 89 06/27/2019    Discharge Medications:   Allergies as of 07/06/2019      Reactions   Onion Other (See Comments)   REACTION: results in coma state   Penicillins Hives, Shortness Of Breath, Swelling   Throat swelling Has patient had a PCN reaction causing immediate rash, facial/tongue/throat swelling, SOB or lightheadedness with hypotension: Yes Has patient had a PCN reaction causing severe  rash involving mucus membranes or skin necrosis: No Has patient had a PCN reaction that required hospitalization: Yes Has patient had a PCN reaction occurring within the last 10 years: No If all of the above answers are "NO", then may proceed with Cephalosporin use.   Sulfa Antibiotics Hives, Shortness Of Breath   Yellow Dye    REACTION: #5 and #90 "will kill me"   Tape Swelling   Pt states she had tape and Tegaderm used on her in [place]. This caused swelling to her face and neck. Wounds and bleeding from area tape/tegaderm was used.    Clindamycin/lincomycin Hives, Nausea And Vomiting   Lipitor [atorvastatin Calcium] Hives      Medication List    STOP taking these medications   cyclobenzaprine 10  MG tablet Commonly known as: FLEXERIL     TAKE these medications   acetaminophen 500 MG tablet Commonly known as: TYLENOL Take 2 tablets (1,000 mg total) by mouth every 8 (eight) hours for 10 days. For Pain.   albuterol (2.5 MG/3ML) 0.083% nebulizer solution Commonly known as: PROVENTIL Take 3 mLs (2.5 mg total) by nebulization every 6 (six) hours as needed for wheezing or shortness of breath. What changed: Another medication with the same name was changed. Make sure you understand how and when to take each.   albuterol 108 (90 Base) MCG/ACT inhaler Commonly known as: VENTOLIN HFA INHALE 2 PUFFS BY MOUTH EVERY 6 HOURS AS NEEDED FOR WHEEZING OR SHORTNESS OF BREATH What changed:   how much to take  how to take this  when to take this  reasons to take this  additional instructions   ALPRAZolam 1 MG tablet Commonly known as: XANAX Take 1 tablet (1 mg total) by mouth 2 (two) times daily as needed. for anxiety   aspirin 325 MG tablet Commonly known as: Bayer Aspirin Take 1 tablet (325 mg total) by mouth daily. For DVT prophylaxis 30 days.   baclofen 10 MG tablet Commonly known as: LIORESAL Take 1 tablet (10 mg total) by mouth 3 (three) times daily as needed for muscle spasms.   gabapentin 400 MG capsule Commonly known as: NEURONTIN TAKE 1 CAPSULE BY MOUTH THREE TIMES DAILY What changed:   when to take this  reasons to take this   ibuprofen 800 MG tablet Commonly known as: ADVIL Take 1 tablet (800 mg total) by mouth every 8 (eight) hours as needed for up to 14 days for mild pain or moderate pain.   Lastacaft 0.25 % Soln Generic drug: Alcaftadine Place 1 drop into both eyes every other day.   lidocaine 5 % Commonly known as: LIDODERM Place 0.5-1 patches onto the skin daily as needed (pain). Remove & Discard patch within 12 hours or as directed by MD   omeprazole 20 MG capsule Commonly known as: PriLOSEC Take 1 capsule (20 mg total) by mouth daily. 30 days for  gastroprotection while taking NSAIDs.   ondansetron 4 MG tablet Commonly known as: Zofran Take 1 tablet (4 mg total) by mouth every 8 (eight) hours as needed for nausea or vomiting.   oxyCODONE 5 MG immediate release tablet Commonly known as: Roxicodone Take 1 tablet (5 mg total) by mouth every 4 (four) hours as needed for up to 7 days for breakthrough pain.   simvastatin 20 MG tablet Commonly known as: ZOCOR Take 1 tablet (20 mg total) by mouth 3 (three) times a week. What changed: when to take this   SUMAtriptan 100 MG tablet Commonly  known as: Imitrex Take 1 tablet 100mg   By mouth at onset of migraine, If symptoms persist 2 hours later then take additional dose.Maximum 2 per 24 hours What changed:   how much to take  how to take this  when to take this  reasons to take this  additional instructions   zolpidem 10 MG tablet Commonly known as: Ambien Take 1 tablet (10 mg total) by mouth at bedtime as needed for sleep. What changed: when to take this       Diagnostic Studies: Dg Knee Left Port  Result Date: 07/05/2019 CLINICAL DATA:  Status post left total knee arthroplasty EXAM: PORTABLE LEFT KNEE - 1-2 VIEW COMPARISON:  None. FINDINGS: Status post left total knee arthroplasty, with well-positioned left distal femoral, left proximal tibial and posterior left patellar prostheses. No dislocation. No acute osseous fracture. No suspicious focal osseous lesions. Expected soft tissue gas within and surrounding the left knee joint. IMPRESSION: Satisfactory immediate postoperative appearance status post left total knee arthroplasty. Electronically Signed   By: Ilona Sorrel M.D.   On: 07/05/2019 10:32    Disposition: Discharge disposition: 01-Home or Self Care       Discharge Instructions    Discharge patient   Complete by: As directed    Discharge disposition: 01-Home or Self Care   Discharge patient date: 07/06/2019      Follow-up Information    Renette Butters,  MD. Go on 07/20/2019.   Specialty: Orthopedic Surgery Why: Your appointmrent has been scheduled for 4:15 Contact information: [address] [place] [address] [phone]        AP OPPT. Go on 07/08/2019.   Why: You are scheduled to start outpatient physcial therapy on 07/08/19.  Facility should call you to set up the time Contact information: [phone]       Renette Butters, MD.   Specialty: Orthopedic Surgery Contact information: [address] [place] [address] [phone]            Signed: Prudencio Burly III PA-C 07/06/2019, 7:37 AM

## 2019-07-06 NOTE — Plan of Care (Signed)
  Problem: Education: Goal: Knowledge of General Education information will improve Description: Including pain rating scale, medication(s)/side effects and non-pharmacologic comfort measures 07/06/2019 1438 by Hubert Azure, RN Outcome: Adequate for Discharge 07/06/2019 0854 by Hubert Azure, RN Outcome: Progressing   Problem: Health Behavior/Discharge Planning: Goal: Ability to manage health-related needs will improve 07/06/2019 1438 by Hubert Azure, RN Outcome: Adequate for Discharge 07/06/2019 0854 by Hubert Azure, RN Outcome: Progressing   Problem: Clinical Measurements: Goal: Ability to maintain clinical measurements within normal limits will improve 07/06/2019 1438 by Hubert Azure, RN Outcome: Adequate for Discharge 07/06/2019 0854 by Hubert Azure, RN Outcome: Progressing Goal: Will remain free from infection 07/06/2019 1438 by Hubert Azure, RN Outcome: Adequate for Discharge 07/06/2019 0854 by Hubert Azure, RN Outcome: Progressing Goal: Diagnostic test results will improve 07/06/2019 1438 by Hubert Azure, RN Outcome: Adequate for Discharge 07/06/2019 0854 by Hubert Azure, RN Outcome: Progressing Goal: Respiratory complications will improve Outcome: Adequate for Discharge Goal: Cardiovascular complication will be avoided Outcome: Adequate for Discharge   Problem: Activity: Goal: Risk for activity intolerance will decrease Outcome: Adequate for Discharge   Problem: Nutrition: Goal: Adequate nutrition will be maintained Outcome: Adequate for Discharge   Problem: Coping: Goal: Level of anxiety will decrease Outcome: Adequate for Discharge   Problem: Elimination: Goal: Will not experience complications related to bowel motility Outcome: Adequate for Discharge Goal: Will not experience complications related to urinary retention Outcome: Adequate for Discharge   Problem: Pain Managment: Goal: General experience of comfort will  improve Outcome: Adequate for Discharge   Problem: Safety: Goal: Ability to remain free from injury will improve Outcome: Adequate for Discharge   Problem: Skin Integrity: Goal: Risk for impaired skin integrity will decrease Outcome: Adequate for Discharge   Problem: Education: Goal: Knowledge of the prescribed therapeutic regimen will improve Outcome: Adequate for Discharge Goal: Individualized Educational Video(s) Outcome: Adequate for Discharge   Problem: Activity: Goal: Ability to avoid complications of mobility impairment will improve Outcome: Adequate for Discharge Goal: Range of joint motion will improve Outcome: Adequate for Discharge   Problem: Clinical Measurements: Goal: Postoperative complications will be avoided or minimized Outcome: Adequate for Discharge   Problem: Pain Management: Goal: Pain level will decrease with appropriate interventions Outcome: Adequate for Discharge   Problem: Skin Integrity: Goal: Will show signs of wound healing Outcome: Adequate for Discharge

## 2019-07-06 NOTE — Progress Notes (Signed)
Patient wore bone foam for 30 minutes. Pain controlled, vitals stable. Patient ambulated to bathroom during shift.

## 2019-07-06 NOTE — Progress Notes (Signed)
SATURATION QUALIFICATIONS: (This note is used to comply with regulatory documentation for home oxygen)  Patient Saturations on Room Air at Rest = 92%  Patient Saturations on Room Air while Ambulating = 94%

## 2019-07-08 ENCOUNTER — Other Ambulatory Visit: Payer: Self-pay

## 2019-07-08 ENCOUNTER — Ambulatory Visit (HOSPITAL_COMMUNITY): Payer: Medicare Other | Attending: Orthopedic Surgery | Admitting: Physical Therapy

## 2019-07-08 ENCOUNTER — Encounter (HOSPITAL_COMMUNITY): Payer: Self-pay | Admitting: Physical Therapy

## 2019-07-08 DIAGNOSIS — M25662 Stiffness of left knee, not elsewhere classified: Secondary | ICD-10-CM | POA: Diagnosis not present

## 2019-07-08 DIAGNOSIS — R2689 Other abnormalities of gait and mobility: Secondary | ICD-10-CM | POA: Diagnosis not present

## 2019-07-08 DIAGNOSIS — M6281 Muscle weakness (generalized): Secondary | ICD-10-CM | POA: Diagnosis not present

## 2019-07-08 DIAGNOSIS — M25562 Pain in left knee: Secondary | ICD-10-CM | POA: Insufficient documentation

## 2019-07-08 NOTE — Therapy (Signed)
[place], [place], [address] Phone: [phone]   Fax:  [phone]  Physical Therapy Evaluation  Patient Details  Name: Mary Wise MRN: [id number] Date of Birth: [birth year] Referring Provider (PT): Dr. Fredonia Highland   Encounter Date: 07/08/2019  PT End of Session - 07/08/19 1540    Visit Number  1    Number of Visits  18    Date for PT Re-Evaluation  08/19/19    Authorization Type  Primary: Medicare; Secondary: Medicaid    Authorization Time Period  07/08/19 - 08/19/19    Authorization - Visit Number  1    Authorization - Number of Visits  10    PT Start Time  [id number]    PT Stop Time  1525    PT Time Calculation (min)  47 min    Equipment Utilized During Treatment  Gait belt    Activity Tolerance  Patient tolerated treatment well;Patient limited by pain    Behavior During Therapy  [facility] for tasks assessed/performed       Past Medical History:  Diagnosis Date  . Anxiety   . Asthma   . Cancer [facility])    left breast cancer  . Family history of cancer   . Genetic testing 02/01/2018   Multi-Cancer panel (83 genes) @ [facility] - No pathogenic mutations detected  . Hemorrhoids   . History of radiation therapy 05/27/18- 06/23/18   Left Breast 40.05 Gy in 15 fractions, left breast boost 10 Gy in 5 fractions.   . Lipoma of left shoulder   . Migraine   . Migraine   . Osteopenia   . 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 ABDOMINAL HYSTERECTOMY    . APPENDECTOMY    . CESAREAN SECTION    . CHOLECYSTECTOMY    . ERCP    . IR FLUORO GUIDE PORT INSERTION RIGHT  10/05/2017  . IR REMOVAL TUN ACCESS W/ PORT W/O FL MOD SED  05/13/2018  . IR US GUIDE VASC ACCESS RIGHT  10/05/2017  . LIPOMA EXCISION Left 03/16/2018   left shoulder  . LIVER SURGERY    . PARTIAL MASTECTOMY WITH AXILLARY SENTINEL LYMPH NODE BIOPSY Left 07/31/2017   Procedure: LEFT PARTIAL  MASTECTOMY WITH AXILLARY SENTINEL LYMPH NODE BIOPSY;  Surgeon: Virl Cagey, MD;  Location: [facility];  Service: General;  Laterality: Left;  . TOTAL KNEE ARTHROPLASTY Left 07/05/2019   Procedure: TOTAL KNEE ARTHROPLASTY;  Surgeon: Renette Butters, MD;  Location: [facility];  Service: Orthopedics;  Laterality: Left;    There were no vitals filed for this visit.   Subjective Assessment - 07/08/19 1447    Subjective  Patient reported she had a left TKA on 07/05/19. Patient reported a history of having had breast cancer. She reported some SOB following surgery, which caused her to have to stay in the hospital a little longer. Patient reported she was using a cane prior to the surgery. Stated she hasn't walked good in 4 years. She reported a history of sciatica.    Pertinent History  LT TKA 07/05/19    Limitations  Standing;Walking;House hold activities    How long can you stand comfortably?  4-5 minutes    How long can you walk comfortably?  14 minutes    Patient Stated Goals  Move better    Currently in Pain?  Yes  Pain Score  7     Pain Location  Knee    Pain Orientation  Left    Pain Descriptors / Indicators  Aching    Pain Type  Surgical pain    Pain Onset  In the past 7 days    Pain Frequency  Constant    Aggravating Factors   Bending it back    Pain Relieving Factors  Resting    Effect of Pain on Daily Activities  Severely affects         [facility] PT Assessment - 07/08/19 0001      Assessment   Medical Diagnosis  LT TKA    Referring Provider (PT)  Dr. Fredonia Highland    Onset Date/Surgical Date  07/05/19    Next MD Visit  07/20/19    Prior Therapy  Yes, following tumor removal      Precautions   Precautions  Fall      Restrictions   Weight Bearing Restrictions  No      Balance Screen   Has the patient fallen in the past 6 months  No    Has the patient had a decrease in activity level because of a fear of falling?   Yes    Is the patient reluctant to leave their home because  of a fear of falling?   Yes      [place] residence    Living Arrangements  Children    Type of [place] Access  Level entry    Home Layout  One level      Prior Function   Level of [place]  Retired      Associate Professor   Overall Cognitive Status  Within Functional Limits for tasks assessed      Observation/Other Assessments   Observations  Noted slight redness, but near incision. Noted bruising of anterior calf of LT LE. Noted edema with observation, will measure next session.     Focus on Therapeutic Outcomes (FOTO)   57% limited      Observation/Other Assessments-Edema    Edema  Circumferential      Circumferential Edema   Circumferential - Right  --   Assess next session     Sensation   Light Touch  Impaired by gross assessment      ROM / Strength   AROM / PROM / Strength  AROM;Strength      AROM   AROM Assessment Site  Knee    Right/Left Knee  Right;Left    Right Knee Extension  10    Right Knee Flexion  80    Left Knee Extension  23    Left Knee Flexion  60      Strength   Strength Assessment Site  Hip;Knee;Ankle    Right/Left Hip  Right;Left    Right Hip Flexion  4/5    Right Hip Extension  3/5    Right Hip ABduction  2+/5    Left Hip Flexion  2-/5    Left Hip Extension  2+/5    Left Hip ABduction  2+/5    Right/Left Knee  Right;Left    Right Knee Flexion  5/5    Right Knee Extension  5/5    Left Knee Flexion  2+/5    Left Knee Extension  2+/5    Right/Left Ankle  Right;Left    Right Ankle Dorsiflexion  5/5  Left Ankle Dorsiflexion  4-/5      Palpation   Palpation comment  Noted warmth of left knee      Transfers   Transfers  Supine to Sit    Supine to Sit  3: Mod assist    Supine to Sit Details (indicate cue type and reason)  To move LT LE      Ambulation/Gait   Ambulation/Gait  Yes    Ambulation Distance (Feet)  75 Feet    Assistive device  Rolling walker     Gait Pattern  Step-to pattern;Antalgic;Decreased hip/knee flexion - left;Decreased stance time - left    Ambulation Surface  Level;Indoor    Gait Comments  2MWT      Balance   Balance Assessed  Yes      Static Standing Balance   Static Standing - Balance Support  No upper extremity supported    Static Standing Balance -  Activities   Single Leg Stance - Right Leg;Single Leg Stance - Left Leg    Static Standing - Comment/# of Minutes  0 seconds LT, 2 seconds RT                Objective measurements completed on examination: See above findings.              PT Education - 07/08/19 1539    Education Details  Discussed examination findings, POC, and initial HEP.    Person(s) Educated  Patient    Methods  Explanation;Handout    Comprehension  Verbalized understanding       PT Short Term Goals - 07/08/19 1541      PT SHORT TERM GOAL #1   Title  Patient will demonstrate understanding and report regular compliance with HEP to improve knee AROM, lower extremity strength, and overall functional mobility.    Time  3    Period  Weeks    Status  New    Target Date  07/29/19      PT SHORT TERM GOAL #2   Title  Patient will demonstrate left knee extension/flexion active range of motion of at least 5-80 degrees to assist with more normalized gait pattern and stair ambulation.    Time  3    Period  Weeks    Status  New    Target Date  07/29/19        PT Long Term Goals - 07/08/19 1542      PT LONG TERM GOAL #1   Title  Patient will perform single limb stance on left lower extremity for 3 seconds in order to assist with stair ambulation.    Time  6    Period  Weeks    Status  New    Target Date  08/19/19      PT LONG TERM GOAL #2   Title  Patient will demonstrate improvement of 1 MMT grade in all knee musculature tested as deficient at evaluation to assist with improvement in gait mechanics and stair ambulation.    Time  6    Period  Weeks    Status  New     Target Date  08/19/19      PT LONG TERM GOAL #3   Title  Patient will improve ROM for left knee extension/flexion to 0-105 degrees to improve squatting, and other functional mobility.    Time  6    Period  Weeks    Status  New    Target Date  08/19/19  PT LONG TERM GOAL #4   Title  Patient will demonstrate ability to ambulate on 2MWT with LRAD an improvement of 100 feet indicating improved ability to ambulate safely household and limited community distances.    Time  6    Period  Weeks    Status  New    Target Date  08/19/19      PT LONG TERM GOAL #5   Title  Patient will report that she has been able to ambulate for at least 20 minutes without more than 2/10 pain in order to perform grocery shopping with greater ease.    Time  6    Period  Weeks    Status  New    Target Date  08/19/19             Plan - 07/08/19 1547    Clinical Impression Statement  Patient is a 68 year old female who presents to outpatient physical therapy S/P LT TKA on 07/05/19. Patient presented with typical post-operative deficits of decreased ROM, decreased strength, decreased functional mobility, decreased gait velocity, decreased balance, and increased edema by observation. Did note some minimal redness around patient's incision, but was not significant. Educated patient on importance of monitoring signs and symptoms including increased redness around the wound, increased SOB, fever, or any other excessive symptoms and educated on contacting MD if she did experience any of these symptoms. Also educated patient on initial HEP as well as discussed icing, elevating, and compression. Patient would benefit from skilled physical therapy in order to continue progressing towards functional goals.    Personal Factors and Comorbidities  Age;Comorbidity 3+    Comorbidities  Hx Cancer, osteopenia, asthma    Examination-Activity Limitations  Stand;Locomotion Level;Bed Mobility;Bend;Transfers;Squat;Stairs     Examination-Participation Restrictions  Yard Work;Cleaning;Meal Prep;Community Activity;Driving;Shop;Laundry    Stability/Clinical Decision Making  Stable/Uncomplicated    Clinical Decision Making  Low    Rehab Potential  Good    PT Frequency  3x / week    PT Duration  6 weeks    PT Treatment/Interventions  ADLs/Self Care Home Management;Cryotherapy;Electrical Stimulation;DME Instruction;Gait training;Stair training;Functional mobility training;Therapeutic activities;Therapeutic exercise;Balance training;Neuromuscular re-education;Patient/family education;Orthotic Fit/Training;Manual techniques;Scar mobilization;Compression bandaging;Passive range of motion;Dry needling;Energy conservation;Taping    PT Next Visit Plan  Review eval/goals, measure circumferential, review HEP and update as needed, focus on ROM and edema reduction    PT Home Exercise Plan  07/08/19: heel slides, quad sets, glute sets x10 2-3 x daily    Consulted and Agree with Plan of Care  Patient       Patient will benefit from skilled therapeutic intervention in order to improve the following deficits and impairments:  Abnormal gait, Increased fascial restricitons, Improper body mechanics, Pain, Decreased mobility, Decreased scar mobility, Decreased activity tolerance, Decreased endurance, Decreased range of motion, Decreased strength, Hypomobility, Decreased balance, Difficulty walking, Increased edema  Visit Diagnosis: Acute pain of left knee  Muscle weakness (generalized)  Stiffness of left knee, not elsewhere classified  Other abnormalities of gait and mobility     Problem List Patient Active Problem List   Diagnosis Date Noted  . Primary osteoarthritis of left knee 06/13/2019  . OA (osteoarthritis) of knee 05/18/2019  . Asthma 05/18/2019  . Hyperlipemia 05/18/2019  . Osteopenia 02/02/2019  . Muscle cramps 01/10/2019  . Peripheral neuropathy 10/11/2018  . Migraines 02/01/2018  . Insomnia 02/01/2018  . Genetic  testing 02/01/2018  . Family history of cancer   . Port-A-Cath in place 10/12/2017  . Malignant neoplasm of  upper-outer quadrant of left breast in female, estrogen receptor negative ([place])   . HTN, goal below 140/90 07/01/2017  . Anxiety 07/01/2017  . Lipoma of left shoulder 06/19/2017  . Left breast mass 06/19/2017   Clarene Critchley PT, DPT 3:58 PM, 07/08/19 [place], [place], [address] Phone: [phone]   Fax:  [phone]  Name: Mary Wise MRN: [id number] Date of Birth: June 16, 1951

## 2019-07-08 NOTE — Patient Instructions (Signed)
Quad Set    With other leg bent, foot flat, slowly tighten muscles on thigh of straight leg while counting out loud to __5__. Repeat with other leg. Repeat __10__ times. Do __2-3__ sessions per day.  http://gt2.exer.us/276   Copyright  VHI. All rights reserved.  Heel Slide    Bend knee and pull heel toward buttocks. Hold _5___ seconds. Return. Repeat with other knee. Repeat _10___ times. Do _2-3___ sessions per day.  http://gt2.exer.us/372   Copyright  VHI. All rights reserved.  Gluteal Sets    Squeeze pelvic floor and hold. Tighten bottom. Hold for _5__ seconds. Relax for _5__ seconds. Repeat _10__ times. Do _2-3__ times a day.   Copyright  VHI. All rights reserved.

## 2019-07-11 ENCOUNTER — Telehealth (HOSPITAL_COMMUNITY): Payer: Self-pay | Admitting: Physical Therapy

## 2019-07-11 ENCOUNTER — Ambulatory Visit (HOSPITAL_COMMUNITY): Payer: Medicare Other | Admitting: Physical Therapy

## 2019-07-11 NOTE — Telephone Encounter (Signed)
L/m SHe is sick today and can not come in

## 2019-07-13 ENCOUNTER — Encounter (HOSPITAL_COMMUNITY): Payer: Self-pay

## 2019-07-13 ENCOUNTER — Ambulatory Visit (HOSPITAL_COMMUNITY): Payer: Medicare Other

## 2019-07-13 ENCOUNTER — Other Ambulatory Visit: Payer: Self-pay

## 2019-07-13 DIAGNOSIS — M25662 Stiffness of left knee, not elsewhere classified: Secondary | ICD-10-CM

## 2019-07-13 DIAGNOSIS — M6281 Muscle weakness (generalized): Secondary | ICD-10-CM | POA: Diagnosis not present

## 2019-07-13 DIAGNOSIS — R2689 Other abnormalities of gait and mobility: Secondary | ICD-10-CM

## 2019-07-13 DIAGNOSIS — M25562 Pain in left knee: Secondary | ICD-10-CM | POA: Diagnosis not present

## 2019-07-13 NOTE — Therapy (Signed)
[place], [place], [address] Phone: [phone]   Fax:  [phone]  Physical Therapy Treatment  Patient Details  Name: Mary Wise MRN: [id number] Date of Birth: 1950/11/19 Referring Provider (PT): Dr. Fredonia Highland   Encounter Date: 07/13/2019  PT End of Session - 07/13/19 1502    Visit Number  2    Number of Visits  18    Date for PT Re-Evaluation  08/19/19    Authorization Type  Primary: Medicare; Secondary: Medicaid    Authorization Time Period  07/08/19 - 08/19/19    Authorization - Visit Number  2    Authorization - Number of Visits  10    PT Start Time  1459   Pt late for apt   PT Stop Time  1534    PT Time Calculation (min)  35 min    Equipment Utilized During Treatment  Gait belt    Activity Tolerance  Patient tolerated treatment well;Patient limited by pain    Behavior During Therapy  WFL for tasks assessed/performed       Past Medical History:  Diagnosis Date  . Anxiety   . Asthma   . Cancer [facility])    left breast cancer  . Family history of cancer   . Genetic testing 02/01/2018   Multi-Cancer panel (83 genes) @ [facility] - No pathogenic mutations detected  . Hemorrhoids   . History of radiation therapy 05/27/18- 06/23/18   Left Breast 40.05 Gy in 15 fractions, left breast boost 10 Gy in 5 fractions.   . Lipoma of left shoulder   . Migraine   . Migraine   . Osteopenia   . 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 ABDOMINAL HYSTERECTOMY    . APPENDECTOMY    . CESAREAN SECTION    . CHOLECYSTECTOMY    . ERCP    . IR FLUORO GUIDE PORT INSERTION RIGHT  10/05/2017  . IR REMOVAL TUN ACCESS W/ PORT W/O FL MOD SED  05/13/2018  . IR US GUIDE VASC ACCESS RIGHT  10/05/2017  . LIPOMA EXCISION Left 03/16/2018   left shoulder  . LIVER SURGERY    . PARTIAL MASTECTOMY WITH AXILLARY SENTINEL LYMPH NODE BIOPSY Left 07/31/2017   Procedure:  LEFT PARTIAL MASTECTOMY WITH AXILLARY SENTINEL LYMPH NODE BIOPSY;  Surgeon: Virl Cagey, MD;  Location: [facility];  Service: General;  Laterality: Left;  . TOTAL KNEE ARTHROPLASTY Left 07/05/2019   Procedure: TOTAL KNEE ARTHROPLASTY;  Surgeon: Renette Butters, MD;  Location: [facility];  Service: Orthopedics;  Laterality: Left;    There were no vitals filed for this visit.  Subjective Assessment - 07/13/19 1501    Subjective  Pt arrived with RW, reports she took a muscle relaxor prior therapy, no pain medication.  Current pain scale 5/10 Lt knee.    Pertinent History  LT TKA 07/05/19    Patient Stated Goals  Move better    Currently in Pain?  Yes    Pain Score  5     Pain Location  Knee    Pain Orientation  Left    Pain Descriptors / Indicators  Aching;Sore    Pain Type  Surgical pain    Pain Onset  In the past 7 days    Pain Frequency  Constant    Aggravating Factors   Bending it back    Pain  Relieving Factors  resting    Effect of Pain on Daily Activities  severly affects         [facility] PT Assessment - 07/13/19 0001      Circumferential Edema   Circumferential - Right  15.5    Circumferential - Left   17                   [facility] Adult PT Treatment/Exercise - 07/13/19 0001      Ambulation/Gait   Ambulation/Gait  Yes    Ambulation Distance (Feet)  60 Feet    Assistive device  Rolling walker    Gait Pattern  Step-to pattern;Antalgic;Decreased hip/knee flexion - left;Decreased stance time - left    Ambulation Surface  Level;Indoor    Gait Comments  adjusted height, cueing for heel to toe      Exercises   Exercises  Knee/Hip      Knee/Hip Exercises: Standing   Gait Training  Adjusted RW height, cueing for heel to toe mechanics      Knee/Hip Exercises: Supine   Quad Sets  Left;10 reps    Quad Sets Limitations  tactile cueing 5" holds    Heel Slides  10 reps    Knee Extension  AROM    Knee Extension Limitations  18   was 23   Knee Flexion  AROM    Knee  Flexion Limitations  68   was 60   Other Supine Knee/Hip Exercises  glut sets 10x 5"      Manual Therapy   Manual Therapy  Edema management    Manual therapy comments  Manual complete separate than rest of tx    Edema Management  Retrogrademassage with LE elevated             PT Education - 07/13/19 1514    Education Details  Reviewed goals, assured compliance iwth HEP, circumferential measurements taken with manual retrograde massage for edema control.  Educated RICE techniques for edema and pain control following PT    Person(s) Educated  Patient    Methods  Explanation    Comprehension  Verbalized understanding;Need further instruction       PT Short Term Goals - 07/08/19 1541      PT SHORT TERM GOAL #1   Title  Patient will demonstrate understanding and report regular compliance with HEP to improve knee AROM, lower extremity strength, and overall functional mobility.    Time  3    Period  Weeks    Status  New    Target Date  07/29/19      PT SHORT TERM GOAL #2   Title  Patient will demonstrate left knee extension/flexion active range of motion of at least 5-80 degrees to assist with more normalized gait pattern and stair ambulation.    Time  3    Period  Weeks    Status  New    Target Date  07/29/19        PT Long Term Goals - 07/08/19 1542      PT LONG TERM GOAL #1   Title  Patient will perform single limb stance on left lower extremity for 3 seconds in order to assist with stair ambulation.    Time  6    Period  Weeks    Status  New    Target Date  08/19/19      PT LONG TERM GOAL #2   Title  Patient will demonstrate improvement of 1 MMT grade  in all knee musculature tested as deficient at evaluation to assist with improvement in gait mechanics and stair ambulation.    Time  6    Period  Weeks    Status  New    Target Date  08/19/19      PT LONG TERM GOAL #3   Title  Patient will improve ROM for left knee extension/flexion to 0-105 degrees to improve  squatting, and other functional mobility.    Time  6    Period  Weeks    Status  New    Target Date  08/19/19      PT LONG TERM GOAL #4   Title  Patient will demonstrate ability to ambulate on 2MWT with LRAD an improvement of 100 feet indicating improved ability to ambulate safely household and limited community distances.    Time  6    Period  Weeks    Status  New    Target Date  08/19/19      PT LONG TERM GOAL #5   Title  Patient will report that she has been able to ambulate for at least 20 minutes without more than 2/10 pain in order to perform grocery shopping with greater ease.    Time  6    Period  Weeks    Status  New    Target Date  08/19/19            Plan - 07/13/19 1543    Clinical Impression Statement  Reviewed goals and assured compliance with HEP.  Pt did require some cueing for recall of exercises though able to demonstrate appropriate mechanics without extra cueing.  Circumferential measurements take with Lt knee 1.5in greater in joint line.  Session focus with ROM based exercises and retrograde massage for edema control to assist with pain and improve ROM.  AROM 18-68 degrees (was 23-60 last session.)    Personal Factors and Comorbidities  Age;Comorbidity 3+    Comorbidities  Hx Cancer, osteopenia, asthma    Examination-Activity Limitations  Stand;Locomotion Level;Bed Mobility;Bend;Transfers;Squat;Stairs    Examination-Participation Restrictions  Yard Work;Cleaning;Meal Prep;Community Activity;Driving;Shop;Laundry    Stability/Clinical Decision Making  Stable/Uncomplicated    Clinical Decision Making  Low    Rehab Potential  Good    PT Frequency  3x / week    PT Duration  6 weeks    PT Treatment/Interventions  ADLs/Self Care Home Management;Cryotherapy;Electrical Stimulation;DME Instruction;Gait training;Stair training;Functional mobility training;Therapeutic activities;Therapeutic exercise;Balance training;Neuromuscular re-education;Patient/family  education;Orthotic Fit/Training;Manual techniques;Scar mobilization;Compression bandaging;Passive range of motion;Dry needling;Energy conservation;Taping    PT Next Visit Plan  Focus on ROM and edema control.  Begin bike rocking, knee drive, SAQ, rockerboard...    PT Home Exercise Plan  07/08/19: heel slides, quad sets, glute sets x10 2-3 x daily       Patient will benefit from skilled therapeutic intervention in order to improve the following deficits and impairments:  Abnormal gait, Increased fascial restricitons, Improper body mechanics, Pain, Decreased mobility, Decreased scar mobility, Decreased activity tolerance, Decreased endurance, Decreased range of motion, Decreased strength, Hypomobility, Decreased balance, Difficulty walking, Increased edema  Visit Diagnosis: Acute pain of left knee  Muscle weakness (generalized)  Other abnormalities of gait and mobility  Stiffness of left knee, not elsewhere classified     Problem List Patient Active Problem List   Diagnosis Date Noted  . Primary osteoarthritis of left knee 06/13/2019  . OA (osteoarthritis) of knee 05/18/2019  . Asthma 05/18/2019  . Hyperlipemia 05/18/2019  . Osteopenia 02/02/2019  . Muscle  cramps 01/10/2019  . Peripheral neuropathy 10/11/2018  . Migraines 02/01/2018  . Insomnia 02/01/2018  . Genetic testing 02/01/2018  . Family history of cancer   . Port-A-Cath in place 10/12/2017  . Malignant neoplasm of upper-outer quadrant of left breast in female, estrogen receptor negative ([place])   . HTN, goal below 140/90 07/01/2017  . Anxiety 07/01/2017  . Lipoma of left shoulder 06/19/2017  . Left breast mass 06/19/2017   Ihor Austin, LPTA; [place]  Aldona Lento 07/13/2019, 4:31 PM  [place] [address] [place], [place], [address] Phone: [phone]   Fax:  [phone]  Name: Mary Wise MRN: [id number] Date of Birth: 1951/02/03

## 2019-07-15 ENCOUNTER — Other Ambulatory Visit: Payer: Self-pay

## 2019-07-15 ENCOUNTER — Ambulatory Visit (HOSPITAL_COMMUNITY): Payer: Medicare Other

## 2019-07-15 ENCOUNTER — Encounter (HOSPITAL_COMMUNITY): Payer: Self-pay

## 2019-07-15 DIAGNOSIS — R2689 Other abnormalities of gait and mobility: Secondary | ICD-10-CM | POA: Diagnosis not present

## 2019-07-15 DIAGNOSIS — M25562 Pain in left knee: Secondary | ICD-10-CM | POA: Diagnosis not present

## 2019-07-15 DIAGNOSIS — M25662 Stiffness of left knee, not elsewhere classified: Secondary | ICD-10-CM

## 2019-07-15 DIAGNOSIS — M6281 Muscle weakness (generalized): Secondary | ICD-10-CM | POA: Diagnosis not present

## 2019-07-15 NOTE — Therapy (Signed)
[place], [place], [address] Phone: [phone]   Fax:  [phone]  Physical Therapy Treatment  Patient Details  Name: Mary Wise MRN: [id number] Date of Birth: 10/07/1950 Referring Provider (PT): Dr. Fredonia Highland   Encounter Date: 07/15/2019  PT End of Session - 07/15/19 1538    Visit Number  3    Number of Visits  18    Date for PT Re-Evaluation  08/19/19    Authorization Type  Primary: Medicare; Secondary: Medicaid    Authorization Time Period  07/08/19 - 08/19/19    Authorization - Visit Number  3    Authorization - Number of Visits  10    PT Start Time  [id number]   4' on bike, not included wiht charges   PT Stop Time  1532    PT Time Calculation (min)  43 min    Equipment Utilized During Treatment  Gait belt   CGA during gait with SPC   Activity Tolerance  Patient tolerated treatment well;No increased pain   Pain scale reduced from 7/10 to 4/10 at EOS   Behavior During Therapy  [facility] for tasks assessed/performed       Past Medical History:  Diagnosis Date  . Anxiety   . Asthma   . Cancer [facility])    left breast cancer  . Family history of cancer   . Genetic testing 02/01/2018   Multi-Cancer panel (83 genes) @ [facility] - No pathogenic mutations detected  . Hemorrhoids   . History of radiation therapy 05/27/18- 06/23/18   Left Breast 40.05 Gy in 15 fractions, left breast boost 10 Gy in 5 fractions.   . Lipoma of left shoulder   . Migraine   . Migraine   . Osteopenia   . 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 ABDOMINAL HYSTERECTOMY    . APPENDECTOMY    . CESAREAN SECTION    . CHOLECYSTECTOMY    . ERCP    . IR FLUORO GUIDE PORT INSERTION RIGHT  10/05/2017  . IR REMOVAL TUN ACCESS W/ PORT W/O FL MOD SED  05/13/2018  . IR US GUIDE VASC ACCESS RIGHT  10/05/2017  . LIPOMA EXCISION Left 03/16/2018   left shoulder  . LIVER SURGERY    .  PARTIAL MASTECTOMY WITH AXILLARY SENTINEL LYMPH NODE BIOPSY Left 07/31/2017   Procedure: LEFT PARTIAL MASTECTOMY WITH AXILLARY SENTINEL LYMPH NODE BIOPSY;  Surgeon: Virl Cagey, MD;  Location: [facility];  Service: General;  Laterality: Left;  . TOTAL KNEE ARTHROPLASTY Left 07/05/2019   Procedure: TOTAL KNEE ARTHROPLASTY;  Surgeon: Renette Butters, MD;  Location: [facility];  Service: Orthopedics;  Laterality: Left;    There were no vitals filed for this visit.  Subjective Assessment - 07/15/19 1457    Subjective  Pt arrived with SPC, reports she may have done too much at home yesterday wiht increased pain Lt knee, 7/10.    Pertinent History  LT TKA 07/05/19    Patient Stated Goals  Move better    Currently in Pain?  Yes    Pain Score  7     Pain Location  Knee    Pain Orientation  Left    Pain Descriptors / Indicators  Aching;Sore;Tightness    Pain Type  Surgical pain    Pain Onset  In the past 7 days    Pain Frequency  Constant    Aggravating Factors   bending it back    Pain Relieving Factors  resting    Effect of Pain on Daily Activities  severly affects                       [facility] Adult PT Treatment/Exercise - 07/15/19 0001      Ambulation/Gait   Ambulation/Gait  Yes    Ambulation Distance (Feet)  226 Feet    Assistive device  Straight cane    Gait Pattern  Step-to pattern;Antalgic;Decreased hip/knee flexion - left;Decreased stance time - left    Ambulation Surface  Level;Indoor    Gait Comments  Educated proper height, good sequence, cueing for heel to toe mechanics      Exercises   Exercises  Knee/Hip      Knee/Hip Exercises: Stretches   Knee: Self-Stretch to increase Flexion  5 reps;10 seconds    Knee: Self-Stretch Limitations  on 6in step      Knee/Hip Exercises: Aerobic   Stationary Bike  4' rocking seat 12      Knee/Hip Exercises: Standing   Rocker Board  2 minutes    Rocker Board Limitations  lateral      Knee/Hip Exercises: Supine   Quad  Sets  Left;10 reps    Short Arc Quad Sets  AAROM;5 reps    Heel Slides  10 reps    Knee Extension  AROM    Knee Extension Limitations  16    Knee Flexion  AROM    Knee Flexion Limitations  85      Manual Therapy   Manual Therapy  Edema management    Manual therapy comments  Manual complete separate than rest of tx    Edema Management  Retrogrademassage with LE elevated               PT Short Term Goals - 07/08/19 1541      PT SHORT TERM GOAL #1   Title  Patient will demonstrate understanding and report regular compliance with HEP to improve knee AROM, lower extremity strength, and overall functional mobility.    Time  3    Period  Weeks    Status  New    Target Date  07/29/19      PT SHORT TERM GOAL #2   Title  Patient will demonstrate left knee extension/flexion active range of motion of at least 5-80 degrees to assist with more normalized gait pattern and stair ambulation.    Time  3    Period  Weeks    Status  New    Target Date  07/29/19        PT Long Term Goals - 07/08/19 1542      PT LONG TERM GOAL #1   Title  Patient will perform single limb stance on left lower extremity for 3 seconds in order to assist with stair ambulation.    Time  6    Period  Weeks    Status  New    Target Date  08/19/19      PT LONG TERM GOAL #2   Title  Patient will demonstrate improvement of 1 MMT grade in all knee musculature tested as deficient at evaluation to assist with improvement in gait mechanics and stair ambulation.    Time  6    Period  Weeks    Status  New    Target Date  08/19/19      PT  LONG TERM GOAL #3   Title  Patient will improve ROM for left knee extension/flexion to 0-105 degrees to improve squatting, and other functional mobility.    Time  6    Period  Weeks    Status  New    Target Date  08/19/19      PT LONG TERM GOAL #4   Title  Patient will demonstrate ability to ambulate on 2MWT with LRAD an improvement of 100 feet indicating improved ability  to ambulate safely household and limited community distances.    Time  6    Period  Weeks    Status  New    Target Date  08/19/19      PT LONG TERM GOAL #5   Title  Patient will report that she has been able to ambulate for at least 20 minutes without more than 2/10 pain in order to perform grocery shopping with greater ease.    Time  6    Period  Weeks    Status  New    Target Date  08/19/19            Plan - 07/15/19 1626    Clinical Impression Statement  Pt arrived ambulating wiht her sister's SPC that was too short.  Pt educated on proper height and gait training complete 2 point sequence with good mechanics following verbal cues for equal stride length and heel to toe mechanics.  Added bike and stretches for knee mobility and rockerboard to improve weight distribution with gait.  Pt progressing well with improved AROM to 16-85 degrees.  Pt demonstrates quad weakness, encouraged pt to complete 100 quad sets through out the day (10 reps every hour) with verbalized understanding.  EOS with manual retrograde massage to address edema present proximal knee.  EOS pt reports pain reduced to 4/10.  Pt stated she plans to stop by her home to pick up her [facility] that is higher.    Personal Factors and Comorbidities  Age;Comorbidity 3+    Comorbidities  Hx Cancer, osteopenia, asthma    Examination-Activity Limitations  Stand;Locomotion Level;Bed Mobility;Bend;Transfers;Squat;Stairs    Examination-Participation Restrictions  Yard Work;Cleaning;Meal Prep;Community Activity;Driving;Shop;Laundry    Stability/Clinical Decision Making  Stable/Uncomplicated    Clinical Decision Making  Low    Rehab Potential  Good    PT Frequency  3x / week    PT Duration  6 weeks    PT Treatment/Interventions  ADLs/Self Care Home Management;Cryotherapy;Electrical Stimulation;DME Instruction;Gait training;Stair training;Functional mobility training;Therapeutic activities;Therapeutic exercise;Balance  training;Neuromuscular re-education;Patient/family education;Orthotic Fit/Training;Manual techniques;Scar mobilization;Compression bandaging;Passive range of motion;Dry needling;Energy conservation;Taping    PT Next Visit Plan  Focus on ROM and edema control.  Add standing TKE, slant board, hamstring stretch and continue quad strengthening, heel slides for mobility.    PT Home Exercise Plan  07/08/19: heel slides, quad sets, glute sets x10 2-3 x daily       Patient will benefit from skilled therapeutic intervention in order to improve the following deficits and impairments:  Abnormal gait, Increased fascial restricitons, Improper body mechanics, Pain, Decreased mobility, Decreased scar mobility, Decreased activity tolerance, Decreased endurance, Decreased range of motion, Decreased strength, Hypomobility, Decreased balance, Difficulty walking, Increased edema  Visit Diagnosis: Other abnormalities of gait and mobility  Muscle weakness (generalized)  Acute pain of left knee  Stiffness of left knee, not elsewhere classified     Problem List Patient Active Problem List   Diagnosis Date Noted  . Primary osteoarthritis of left knee 06/13/2019  . OA (osteoarthritis)  of knee 05/18/2019  . Asthma 05/18/2019  . Hyperlipemia 05/18/2019  . Osteopenia 02/02/2019  . Muscle cramps 01/10/2019  . Peripheral neuropathy 10/11/2018  . Migraines 02/01/2018  . Insomnia 02/01/2018  . Genetic testing 02/01/2018  . Family history of cancer   . Port-A-Cath in place 10/12/2017  . Malignant neoplasm of upper-outer quadrant of left breast in female, estrogen receptor negative ([place])   . HTN, goal below 140/90 07/01/2017  . Anxiety 07/01/2017  . Lipoma of left shoulder 06/19/2017  . Left breast mass 06/19/2017   Ihor Austin, LPTA; [place]  Aldona Lento 07/15/2019, 4:35 PM  [facility] [address] [place], [place], [address] Phone:  [phone]   Fax:  [phone]  Name: Mary Wise MRN: [id number] Date of Birth: 08/26/1950

## 2019-07-18 ENCOUNTER — Other Ambulatory Visit: Payer: Self-pay

## 2019-07-18 ENCOUNTER — Encounter (HOSPITAL_COMMUNITY): Payer: Self-pay | Admitting: Physical Therapy

## 2019-07-18 ENCOUNTER — Ambulatory Visit (HOSPITAL_COMMUNITY): Payer: Medicare Other | Admitting: Physical Therapy

## 2019-07-18 DIAGNOSIS — M6281 Muscle weakness (generalized): Secondary | ICD-10-CM

## 2019-07-18 DIAGNOSIS — M25562 Pain in left knee: Secondary | ICD-10-CM

## 2019-07-18 DIAGNOSIS — M25662 Stiffness of left knee, not elsewhere classified: Secondary | ICD-10-CM

## 2019-07-18 DIAGNOSIS — R2689 Other abnormalities of gait and mobility: Secondary | ICD-10-CM

## 2019-07-18 NOTE — Therapy (Addendum)
[place], [place], [address] Phone: [phone]   Fax:  [phone]  Physical Therapy Treatment  Patient Details  Name: Mary Wise MRN: [id number] Date of Birth: 1950/12/23 Referring Provider (PT): Dr. Fredonia Highland   Encounter Date: 07/18/2019  PT End of Session - 07/18/19 1500    Visit Number  4    Number of Visits  18    Date for PT Re-Evaluation  08/19/19    Authorization Type  Primary: Medicare; Secondary: Medicaid    Authorization Time Period  07/08/19 - 08/19/19    Authorization - Visit Number  4    Authorization - Number of Visits  10    PT Start Time  1440    PT Stop Time  1520    PT Time Calculation (min)  40 min    Equipment Utilized During Treatment  --   CGA during gait with SPC   Activity Tolerance  Patient limited by pain     Behavior During Therapy  [facility] for tasks assessed/performed       Past Medical History:  Diagnosis Date  . Anxiety   . Asthma   . Cancer [facility])    left breast cancer  . Family history of cancer   . Genetic testing 02/01/2018   Multi-Cancer panel (83 genes) @ [facility] - No pathogenic mutations detected  . Hemorrhoids   . History of radiation therapy 05/27/18- 06/23/18   Left Breast 40.05 Gy in 15 fractions, left breast boost 10 Gy in 5 fractions.   . Lipoma of left shoulder   . Migraine   . Migraine   . Osteopenia   . 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 ABDOMINAL HYSTERECTOMY    . APPENDECTOMY    . CESAREAN SECTION    . CHOLECYSTECTOMY    . ERCP    . IR FLUORO GUIDE PORT INSERTION RIGHT  10/05/2017  . IR REMOVAL TUN ACCESS W/ PORT W/O FL MOD SED  05/13/2018  . IR US GUIDE VASC ACCESS RIGHT  10/05/2017  . LIPOMA EXCISION Left 03/16/2018   left shoulder  . LIVER SURGERY    . PARTIAL MASTECTOMY WITH AXILLARY SENTINEL LYMPH NODE BIOPSY Left 07/31/2017   Procedure: LEFT PARTIAL MASTECTOMY WITH  AXILLARY SENTINEL LYMPH NODE BIOPSY;  Surgeon: Virl Cagey, MD;  Location: [facility];  Service: General;  Laterality: Left;  . TOTAL KNEE ARTHROPLASTY Left 07/05/2019   Procedure: TOTAL KNEE ARTHROPLASTY;  Surgeon: Renette Butters, MD;  Location: [facility];  Service: Orthopedics;  Laterality: Left;    There were no vitals filed for this visit.  Subjective Assessment - 07/18/19 1458    Subjective  Patient reported having a bad night yesterday and having a lot of pain yesterday. Reported pain today as well 5/10 as well as a migraine.    Pertinent History  LT TKA 07/05/19    Patient Stated Goals  Move better    Currently in Pain?  Yes    Pain Score  5     Pain Location  Knee    Pain Orientation  Left    Pain Descriptors / Indicators  Aching    Pain Type  Surgical pain    Pain Onset  1 to 4 weeks ago  [facility] Adult PT Treatment/Exercise - 07/18/19 0001      Ambulation/Gait   Ambulation/Gait  Yes    Ambulation Distance (Feet)  226 Feet    Assistive device  Straight cane    Gait Pattern  Step-to pattern;Antalgic;Decreased hip/knee flexion - left;Decreased stance time - left    Ambulation Surface  Level;Indoor    Gait Comments  Educated for heel to toe pattern      Exercises   Exercises  Knee/Hip      Knee/Hip Exercises: Stretches   Knee: Self-Stretch to increase Flexion  5 reps;10 seconds    Knee: Self-Stretch Limitations  on 6in step    Gastroc Stretch  Both;3 reps;30 seconds    Gastroc Stretch Limitations  Slant board      Knee/Hip Exercises: Standing   Terminal Knee Extension  Left;1 set;10 reps    Terminal Knee Extension Limitations  Yellow ball behind knee into wall. 5'' holds.       Knee/Hip Exercises: Supine   Quad Sets  Left;10 reps    Short Arc Quad Sets  AAROM;5 reps    Heel Slides  10 reps    Knee Extension  AROM    Knee Extension Limitations  15    Knee Flexion  AROM    Knee Flexion Limitations  83      Manual Therapy    Manual Therapy  Edema management    Manual therapy comments  Manual complete separate than rest of tx    Edema Management  Retrograde massage to LT LE with LE elevated             PT Education - 07/18/19 1559    Education Details  Discussed continuing HEP and taking breaks in between as well as continuing icing and elevating her lower extremities and using RICE technique.    Person(s) Educated  Patient    Methods  Explanation    Comprehension  Verbalized understanding       PT Short Term Goals - 07/08/19 1541      PT SHORT TERM GOAL #1   Title  Patient will demonstrate understanding and report regular compliance with HEP to improve knee AROM, lower extremity strength, and overall functional mobility.    Time  3    Period  Weeks    Status  New    Target Date  07/29/19      PT SHORT TERM GOAL #2   Title  Patient will demonstrate left knee extension/flexion active range of motion of at least 5-80 degrees to assist with more normalized gait pattern and stair ambulation.    Time  3    Period  Weeks    Status  New    Target Date  07/29/19        PT Long Term Goals - 07/08/19 1542      PT LONG TERM GOAL #1   Title  Patient will perform single limb stance on left lower extremity for 3 seconds in order to assist with stair ambulation.    Time  6    Period  Weeks    Status  New    Target Date  08/19/19      PT LONG TERM GOAL #2   Title  Patient will demonstrate improvement of 1 MMT grade in all knee musculature tested as deficient at evaluation to assist with improvement in gait mechanics and stair ambulation.    Time  6    Period  Weeks    Status  New    Target Date  08/19/19      PT LONG TERM GOAL #3   Title  Patient will improve ROM for left knee extension/flexion to 0-105 degrees to improve squatting, and other functional mobility.    Time  6    Period  Weeks    Status  New    Target Date  08/19/19      PT LONG TERM GOAL #4   Title  Patient will demonstrate  ability to ambulate on 2MWT with LRAD an improvement of 100 feet indicating improved ability to ambulate safely household and limited community distances.    Time  6    Period  Weeks    Status  New    Target Date  08/19/19      PT LONG TERM GOAL #5   Title  Patient will report that she has been able to ambulate for at least 20 minutes without more than 2/10 pain in order to perform grocery shopping with greater ease.    Time  6    Period  Weeks    Status  New    Target Date  08/19/19            Plan - 07/18/19 1647    Clinical Impression Statement  Patient arrived with increased reports of pain as she said she felt like she had done too much the day before. Patient also reporting migraine this date. Modified session by performing exercises in room with lights dimmed due to photosensitivity from migraine. Began session with manual therapy to reduce pain and improve ability to perform exercises. Patient would benefit from continued skilled physical therapy to progress towards functional goals.    Personal Factors and Comorbidities  Age;Comorbidity 3+    Comorbidities  Hx Cancer, osteopenia, asthma    Examination-Activity Limitations  Stand;Locomotion Level;Bed Mobility;Bend;Transfers;Squat;Stairs    Examination-Participation Restrictions  Yard Work;Cleaning;Meal Prep;Community Activity;Driving;Shop;Laundry    Stability/Clinical Decision Making  Stable/Uncomplicated    Rehab Potential  Good    PT Frequency  3x / week    PT Duration  6 weeks    PT Treatment/Interventions  ADLs/Self Care Home Management;Cryotherapy;Electrical Stimulation;DME Instruction;Gait training;Stair training;Functional mobility training;Therapeutic activities;Therapeutic exercise;Balance training;Neuromuscular re-education;Patient/family education;Orthotic Fit/Training;Manual techniques;Scar mobilization;Compression bandaging;Passive range of motion;Dry needling;Energy conservation;Taping    PT Next Visit Plan  Focus  on ROM and edema control.  Continue with exercises to improve mobility and decrease pain.    PT Home Exercise Plan  07/08/19: heel slides, quad sets, glute sets x10 2-3 x daily       Patient will benefit from skilled therapeutic intervention in order to improve the following deficits and impairments:  Abnormal gait, Increased fascial restricitons, Improper body mechanics, Pain, Decreased mobility, Decreased scar mobility, Decreased activity tolerance, Decreased endurance, Decreased range of motion, Decreased strength, Hypomobility, Decreased balance, Difficulty walking, Increased edema  Visit Diagnosis: Acute pain of left knee  Muscle weakness (generalized)  Stiffness of left knee, not elsewhere classified  Other abnormalities of gait and mobility     Problem List Patient Active Problem List   Diagnosis Date Noted  . Primary osteoarthritis of left knee 06/13/2019  . OA (osteoarthritis) of knee 05/18/2019  . Asthma 05/18/2019  . Hyperlipemia 05/18/2019  . Osteopenia 02/02/2019  . Muscle cramps 01/10/2019  . Peripheral neuropathy 10/11/2018  . Migraines 02/01/2018  . Insomnia 02/01/2018  . Genetic testing 02/01/2018  . Family history of cancer   . Port-A-Cath in place 10/12/2017  . Malignant neoplasm of upper-outer  quadrant of left breast in female, estrogen receptor negative ([place])   . HTN, goal below 140/90 07/01/2017  . Anxiety 07/01/2017  . Lipoma of left shoulder 06/19/2017  . Left breast mass 06/19/2017   Clarene Critchley PT, DPT 4:51 PM, 07/18/19 [place], [place], [address] Phone: [phone]   Fax:  [phone]  Name: HADJA CIESLIK MRN: [id number] Date of Birth: [birth year]

## 2019-07-20 ENCOUNTER — Ambulatory Visit (HOSPITAL_COMMUNITY): Payer: Medicare Other | Admitting: Physical Therapy

## 2019-07-20 ENCOUNTER — Encounter (HOSPITAL_COMMUNITY): Payer: Self-pay | Admitting: Physical Therapy

## 2019-07-20 ENCOUNTER — Other Ambulatory Visit: Payer: Self-pay

## 2019-07-20 DIAGNOSIS — M25662 Stiffness of left knee, not elsewhere classified: Secondary | ICD-10-CM

## 2019-07-20 DIAGNOSIS — R2689 Other abnormalities of gait and mobility: Secondary | ICD-10-CM | POA: Diagnosis not present

## 2019-07-20 DIAGNOSIS — M6281 Muscle weakness (generalized): Secondary | ICD-10-CM

## 2019-07-20 DIAGNOSIS — M25562 Pain in left knee: Secondary | ICD-10-CM

## 2019-07-20 NOTE — Therapy (Signed)
[place], [place], [address] Phone: [phone]   Fax:  [phone]  Physical Therapy Treatment  Patient Details  Name: Mary Wise MRN: [id number] Date of Birth: Jan 24, 1951 Referring Provider (PT): Dr. Fredonia Highland   Encounter Date: 07/20/2019  PT End of Session - 07/20/19 1525    Visit Number  5    Number of Visits  18    Date for PT Re-Evaluation  08/19/19    Authorization Type  Primary: Medicare; Secondary: Medicaid    Authorization Time Period  07/08/19 - 08/19/19    Authorization - Visit Number  5    Authorization - Number of Visits  10    PT Start Time  1430    PT Stop Time  1500    PT Time Calculation (min)  30 min    Equipment Utilized During Treatment  --     Activity Tolerance  Patient tolerated treatment well;Patient limited by pain     Behavior During Therapy  [facility] for tasks assessed/performed       Past Medical History:  Diagnosis Date  . Anxiety   . Asthma   . Cancer [facility])    left breast cancer  . Family history of cancer   . Genetic testing 02/01/2018   Multi-Cancer panel (83 genes) @ [facility] - No pathogenic mutations detected  . Hemorrhoids   . History of radiation therapy 05/27/18- 06/23/18   Left Breast 40.05 Gy in 15 fractions, left breast boost 10 Gy in 5 fractions.   . Lipoma of left shoulder   . Migraine   . Migraine   . Osteopenia   . 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 ABDOMINAL HYSTERECTOMY    . APPENDECTOMY    . CESAREAN SECTION    . CHOLECYSTECTOMY    . ERCP    . IR FLUORO GUIDE PORT INSERTION RIGHT  10/05/2017  . IR REMOVAL TUN ACCESS W/ PORT W/O FL MOD SED  05/13/2018  . IR US GUIDE VASC ACCESS RIGHT  10/05/2017  . LIPOMA EXCISION Left 03/16/2018   left shoulder  . LIVER SURGERY    . PARTIAL MASTECTOMY WITH AXILLARY SENTINEL LYMPH NODE BIOPSY Left 07/31/2017   Procedure: LEFT PARTIAL MASTECTOMY  WITH AXILLARY SENTINEL LYMPH NODE BIOPSY;  Surgeon: Virl Cagey, MD;  Location: [facility];  Service: General;  Laterality: Left;  . TOTAL KNEE ARTHROPLASTY Left 07/05/2019   Procedure: TOTAL KNEE ARTHROPLASTY;  Surgeon: Renette Butters, MD;  Location: [facility];  Service: Orthopedics;  Laterality: Left;    There were no vitals filed for this visit.  Subjective Assessment - 07/20/19 1434    Subjective  Patient reported feeling much better this date.    Pertinent History  LT TKA 07/05/19    Patient Stated Goals  Move better    Currently in Pain?  Yes    Pain Score  5     Pain Location  Knee    Pain Orientation  Left    Pain Descriptors / Indicators  Aching    Pain Type  Surgical pain                       [facility] Adult PT Treatment/Exercise - 07/20/19 0001      Knee/Hip Exercises: Stretches   Knee: Self-Stretch to increase Flexion  10 seconds   10 repetitions  Knee: Self-Stretch Limitations  on 12'' step    Gastroc Stretch  Both;3 reps;30 seconds    Gastroc Stretch Limitations  Slant board      Knee/Hip Exercises: Standing   Knee Flexion  Strengthening;Left;1 set;10 reps    Terminal Knee Extension  Left;1 set    Theraband Level (Terminal Knee Extension)  --   8 repetitions   Terminal Knee Extension Limitations  Red theraband      Knee/Hip Exercises: Supine   Quad Sets  Left;10 reps    Short Arc [employer]  Strengthening;AROM;Left;10 reps    Short Arc Quad Sets Limitations  Knee on foam roll    Heel Slides  --   8 reps   Knee Extension  AROM    Knee Extension Limitations  16    Knee Flexion  AROM    Knee Flexion Limitations  79      Manual Therapy   Manual Therapy  Edema management    Manual therapy comments  Manual complete separate than rest of tx    Edema Management  Retrograde massage to LT LE with LE elevated               PT Short Term Goals - 07/08/19 1541      PT SHORT TERM GOAL #1   Title  Patient will demonstrate understanding and  report regular compliance with HEP to improve knee AROM, lower extremity strength, and overall functional mobility.    Time  3    Period  Weeks    Status  New    Target Date  07/29/19      PT SHORT TERM GOAL #2   Title  Patient will demonstrate left knee extension/flexion active range of motion of at least 5-80 degrees to assist with more normalized gait pattern and stair ambulation.    Time  3    Period  Weeks    Status  New    Target Date  07/29/19        PT Long Term Goals - 07/08/19 1542      PT LONG TERM GOAL #1   Title  Patient will perform single limb stance on left lower extremity for 3 seconds in order to assist with stair ambulation.    Time  6    Period  Weeks    Status  New    Target Date  08/19/19      PT LONG TERM GOAL #2   Title  Patient will demonstrate improvement of 1 MMT grade in all knee musculature tested as deficient at evaluation to assist with improvement in gait mechanics and stair ambulation.    Time  6    Period  Weeks    Status  New    Target Date  08/19/19      PT LONG TERM GOAL #3   Title  Patient will improve ROM for left knee extension/flexion to 0-105 degrees to improve squatting, and other functional mobility.    Time  6    Period  Weeks    Status  New    Target Date  08/19/19      PT LONG TERM GOAL #4   Title  Patient will demonstrate ability to ambulate on 2MWT with LRAD an improvement of 100 feet indicating improved ability to ambulate safely household and limited community distances.    Time  6    Period  Weeks    Status  New    Target Date  08/19/19  PT LONG TERM GOAL #5   Title  Patient will report that she has been able to ambulate for at least 20 minutes without more than 2/10 pain in order to perform grocery shopping with greater ease.    Time  6    Period  Weeks    Status  New    Target Date  08/19/19            Plan - 07/20/19 1528    Clinical Impression Statement  Patient reported feeling better upon  entering session. Session was limited as patient requested to leave early to make it to her MD appointment. This session added TKE with theraband as well as knee flexion. Patient was limited by pain particularly with heel slides this session and was only able to complete 8. Ended session with retrograde massage to reduce swelling and pain. Patient reported decrease in pain following this.    Personal Factors and Comorbidities  Age;Comorbidity 3+    Comorbidities  Hx Cancer, osteopenia, asthma    Examination-Activity Limitations  Stand;Locomotion Level;Bed Mobility;Bend;Transfers;Squat;Stairs    Examination-Participation Restrictions  Yard Work;Cleaning;Meal Prep;Community Activity;Driving;Shop;Laundry    Stability/Clinical Decision Making  Stable/Uncomplicated    Rehab Potential  Good    PT Frequency  3x / week    PT Duration  6 weeks    PT Treatment/Interventions  ADLs/Self Care Home Management;Cryotherapy;Electrical Stimulation;DME Instruction;Gait training;Stair training;Functional mobility training;Therapeutic activities;Therapeutic exercise;Balance training;Neuromuscular re-education;Patient/family education;Orthotic Fit/Training;Manual techniques;Scar mobilization;Compression bandaging;Passive range of motion;Dry needling;Energy conservation;Taping    PT Next Visit Plan  Focus on ROM and edema control.  Continue with exercises to improve mobility and decrease pain.    PT Home Exercise Plan  07/08/19: heel slides, quad sets, glute sets x10 2-3 x daily       Patient will benefit from skilled therapeutic intervention in order to improve the following deficits and impairments:  Abnormal gait, Increased fascial restricitons, Improper body mechanics, Pain, Decreased mobility, Decreased scar mobility, Decreased activity tolerance, Decreased endurance, Decreased range of motion, Decreased strength, Hypomobility, Decreased balance, Difficulty walking, Increased edema  Visit Diagnosis: Acute pain of  left knee  Stiffness of left knee, not elsewhere classified  Muscle weakness (generalized)  Other abnormalities of gait and mobility     Problem List Patient Active Problem List   Diagnosis Date Noted  . Primary osteoarthritis of left knee 06/13/2019  . OA (osteoarthritis) of knee 05/18/2019  . Asthma 05/18/2019  . Hyperlipemia 05/18/2019  . Osteopenia 02/02/2019  . Muscle cramps 01/10/2019  . Peripheral neuropathy 10/11/2018  . Migraines 02/01/2018  . Insomnia 02/01/2018  . Genetic testing 02/01/2018  . Family history of cancer   . Port-A-Cath in place 10/12/2017  . Malignant neoplasm of upper-outer quadrant of left breast in female, estrogen receptor negative ([place])   . HTN, goal below 140/90 07/01/2017  . Anxiety 07/01/2017  . Lipoma of left shoulder 06/19/2017  . Left breast mass 06/19/2017    Clarene Critchley 07/20/2019, 3:35 PM  [place], [place], [address] Phone: [phone]   Fax:  [phone]  Name: YANIYA GABAY MRN: [id number] Date of Birth: [birth year]

## 2019-07-21 ENCOUNTER — Ambulatory Visit: Payer: Medicare Other | Admitting: Hematology

## 2019-07-22 ENCOUNTER — Telehealth (HOSPITAL_COMMUNITY): Payer: Self-pay

## 2019-07-22 ENCOUNTER — Ambulatory Visit (HOSPITAL_COMMUNITY): Payer: Medicare Other

## 2019-07-22 NOTE — Telephone Encounter (Signed)
She saw Md yesterday and she has imflamation in the lower leg and knee. Taking meds will cx today and hopes to be here on Monday as scheduled.

## 2019-07-25 ENCOUNTER — Ambulatory Visit (HOSPITAL_COMMUNITY): Payer: Medicare Other | Admitting: Physical Therapy

## 2019-07-25 ENCOUNTER — Encounter (HOSPITAL_COMMUNITY): Payer: Self-pay | Admitting: Physical Therapy

## 2019-07-25 ENCOUNTER — Other Ambulatory Visit: Payer: Self-pay

## 2019-07-25 DIAGNOSIS — R2689 Other abnormalities of gait and mobility: Secondary | ICD-10-CM

## 2019-07-25 DIAGNOSIS — M25562 Pain in left knee: Secondary | ICD-10-CM | POA: Diagnosis not present

## 2019-07-25 DIAGNOSIS — M25662 Stiffness of left knee, not elsewhere classified: Secondary | ICD-10-CM

## 2019-07-25 DIAGNOSIS — M6281 Muscle weakness (generalized): Secondary | ICD-10-CM

## 2019-07-25 NOTE — Therapy (Signed)
[place], [place], [address] Phone: [phone]   Fax:  [phone]  Physical Therapy Treatment  Patient Details  Name: Mary Wise MRN: [id number] Date of Birth: 1951/01/15 Referring Provider (PT): Dr. Fredonia Highland   Encounter Date: 07/25/2019  PT End of Session - 07/25/19 1442    Visit Number  6    Number of Visits  18    Date for PT Re-Evaluation  08/19/19    Authorization Type  Primary: Medicare; Secondary: Medicaid    Authorization Time Period  07/08/19 - 08/19/19    Authorization - Visit Number  6    Authorization - Number of Visits  10    PT Start Time  [id number]    PT Stop Time  1520    PT Time Calculation (min)  45 min    Equipment Utilized During Treatment  --   CGA during gait with SPC   Activity Tolerance  Patient tolerated treatment well     Behavior During Therapy  [facility] for tasks assessed/performed       Past Medical History:  Diagnosis Date  . Anxiety   . Asthma   . Cancer [facility])    left breast cancer  . Family history of cancer   . Genetic testing 02/01/2018   Multi-Cancer panel (83 genes) @ [facility] - No pathogenic mutations detected  . Hemorrhoids   . History of radiation therapy 05/27/18- 06/23/18   Left Breast 40.05 Gy in 15 fractions, left breast boost 10 Gy in 5 fractions.   . Lipoma of left shoulder   . Migraine   . Migraine   . Osteopenia   . 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 ABDOMINAL HYSTERECTOMY    . APPENDECTOMY    . CESAREAN SECTION    . CHOLECYSTECTOMY    . ERCP    . IR FLUORO GUIDE PORT INSERTION RIGHT  10/05/2017  . IR REMOVAL TUN ACCESS W/ PORT W/O FL MOD SED  05/13/2018  . IR US GUIDE VASC ACCESS RIGHT  10/05/2017  . LIPOMA EXCISION Left 03/16/2018   left shoulder  . LIVER SURGERY    . PARTIAL MASTECTOMY WITH AXILLARY SENTINEL LYMPH NODE BIOPSY Left 07/31/2017   Procedure: LEFT PARTIAL  MASTECTOMY WITH AXILLARY SENTINEL LYMPH NODE BIOPSY;  Surgeon: Virl Cagey, MD;  Location: [facility];  Service: General;  Laterality: Left;  . TOTAL KNEE ARTHROPLASTY Left 07/05/2019   Procedure: TOTAL KNEE ARTHROPLASTY;  Surgeon: Renette Butters, MD;  Location: [facility];  Service: Orthopedics;  Laterality: Left;    There were no vitals filed for this visit.  Subjective Assessment - 07/25/19 1441    Subjective  Patient says she is having increased pain and swelling today.    Pertinent History  LT TKA 07/05/19    Patient Stated Goals  Move better    Currently in Pain?  Yes    Pain Score  6     Pain Location  Knee    Pain Orientation  Left    Pain Descriptors / Indicators  Spasm;Stabbing;Sharp    Pain Type  Surgical pain    Pain Onset  1 to 4 weeks ago    Pain Frequency  Constant                       [facility] Adult PT Treatment/Exercise - 07/25/19  0001      Knee/Hip Exercises: Stretches   Knee: Self-Stretch to increase Flexion  Left;5 reps;10 seconds    Knee: Self-Stretch Limitations  on 12'' step    Gastroc Stretch  Both;3 reps;30 seconds    Gastroc Stretch Limitations  Slant board      Knee/Hip Exercises: Aerobic   Recumbent Bike  4 min warm up for mobility; gentle rocking      Knee/Hip Exercises: Standing   Heel Raises  15 reps    Terminal Knee Extension  Left;15 reps    Theraband Level (Terminal Knee Extension)  Other (comment)   purple band   Forward Step Up  Left;15 reps;Hand Hold: 1;Step Height: 4"    Gait Training  226 feet with no AD; SPC in hand; SBA    Other Standing Knee Exercises  tandem stance on solid floor; 3 x 20"      Knee/Hip Exercises: Seated   Sit to Sand  10 reps;without UE support      Knee/Hip Exercises: Supine   Knee Extension  AROM    Knee Extension Limitations  11    Knee Flexion  AROM    Knee Flexion Limitations  80      Manual Therapy   Manual Therapy  Edema management    Manual therapy comments  Manual complete separate  than rest of tx    Edema Management  Retrograde massage to LT LE with LE elevated               PT Short Term Goals - 07/08/19 1541      PT SHORT TERM GOAL #1   Title  Patient will demonstrate understanding and report regular compliance with HEP to improve knee AROM, lower extremity strength, and overall functional mobility.    Time  3    Period  Weeks    Status  New    Target Date  07/29/19      PT SHORT TERM GOAL #2   Title  Patient will demonstrate left knee extension/flexion active range of motion of at least 5-80 degrees to assist with more normalized gait pattern and stair ambulation.    Time  3    Period  Weeks    Status  New    Target Date  07/29/19        PT Long Term Goals - 07/08/19 1542      PT LONG TERM GOAL #1   Title  Patient will perform single limb stance on left lower extremity for 3 seconds in order to assist with stair ambulation.    Time  6    Period  Weeks    Status  New    Target Date  08/19/19      PT LONG TERM GOAL #2   Title  Patient will demonstrate improvement of 1 MMT grade in all knee musculature tested as deficient at evaluation to assist with improvement in gait mechanics and stair ambulation.    Time  6    Period  Weeks    Status  New    Target Date  08/19/19      PT LONG TERM GOAL #3   Title  Patient will improve ROM for left knee extension/flexion to 0-105 degrees to improve squatting, and other functional mobility.    Time  6    Period  Weeks    Status  New    Target Date  08/19/19      PT LONG TERM GOAL #4  Title  Patient will demonstrate ability to ambulate on 2MWT with LRAD an improvement of 100 feet indicating improved ability to ambulate safely household and limited community distances.    Time  6    Period  Weeks    Status  New    Target Date  08/19/19      PT LONG TERM GOAL #5   Title  Patient will report that she has been able to ambulate for at least 20 minutes without more than 2/10 pain in order to perform  grocery shopping with greater ease.    Time  6    Period  Weeks    Status  New    Target Date  08/19/19            Plan - 07/25/19 1524    Clinical Impression Statement  Patient tolerated session well today but was limited in knee AROm by pain. Pateitn was able to progress to step ups on 4 inch box, addes sit to stands, and tandem stance for balance with no increased complaint of pain. Patient has good static balance and was able to progress to gait with no AD, but with noted gait abnormalities. Patient cued on heel to toe transition and even stride length. Patient remains limited in knee AROM and was educated on importance of HEP stretching for improved mobility.    Personal Factors and Comorbidities  Age;Comorbidity 3+    Comorbidities  Hx Cancer, osteopenia, asthma    Examination-Activity Limitations  Stand;Locomotion Level;Bed Mobility;Bend;Transfers;Squat;Stairs    Examination-Participation Restrictions  Yard Work;Cleaning;Meal Prep;Community Activity;Driving;Shop;Laundry    Stability/Clinical Decision Making  Stable/Uncomplicated    Rehab Potential  Good    PT Frequency  3x / week    PT Duration  6 weeks    PT Treatment/Interventions  ADLs/Self Care Home Management;Cryotherapy;Electrical Stimulation;DME Instruction;Gait training;Stair training;Functional mobility training;Therapeutic activities;Therapeutic exercise;Balance training;Neuromuscular re-education;Patient/family education;Orthotic Fit/Training;Manual techniques;Scar mobilization;Compression bandaging;Passive range of motion;Dry needling;Energy conservation;Taping    PT Next Visit Plan  add foam to tandem stance, increase step up to 6 in box    PT Home Exercise Plan  07/08/19: heel slides, quad sets, glute sets x10 2-3 x daily       Patient will benefit from skilled therapeutic intervention in order to improve the following deficits and impairments:  Abnormal gait, Increased fascial restricitons, Improper body mechanics,  Pain, Decreased mobility, Decreased scar mobility, Decreased activity tolerance, Decreased endurance, Decreased range of motion, Decreased strength, Hypomobility, Decreased balance, Difficulty walking, Increased edema  Visit Diagnosis: Acute pain of left knee  Stiffness of left knee, not elsewhere classified  Muscle weakness (generalized)  Other abnormalities of gait and mobility     Problem List Patient Active Problem List   Diagnosis Date Noted  . Primary osteoarthritis of left knee 06/13/2019  . OA (osteoarthritis) of knee 05/18/2019  . Asthma 05/18/2019  . Hyperlipemia 05/18/2019  . Osteopenia 02/02/2019  . Muscle cramps 01/10/2019  . Peripheral neuropathy 10/11/2018  . Migraines 02/01/2018  . Insomnia 02/01/2018  . Genetic testing 02/01/2018  . Family history of cancer   . Port-A-Cath in place 10/12/2017  . Malignant neoplasm of upper-outer quadrant of left breast in female, estrogen receptor negative ([place])   . HTN, goal below 140/90 07/01/2017  . Anxiety 07/01/2017  . Lipoma of left shoulder 06/19/2017  . Left breast mass 06/19/2017   3:29 PM, 07/25/19 Josue Hector PT DPT  Physical Therapist with [facility]  [phone]   [place]  [facility] [address] [place], [place], [address] Phone: [phone]   Fax:  [phone]  Name: Mary Wise MRN: [id number] Date of Birth: 02-Jan-1951

## 2019-07-26 ENCOUNTER — Ambulatory Visit (HOSPITAL_COMMUNITY): Payer: Medicare Other

## 2019-07-26 ENCOUNTER — Encounter (HOSPITAL_COMMUNITY): Payer: Self-pay

## 2019-07-26 DIAGNOSIS — M25662 Stiffness of left knee, not elsewhere classified: Secondary | ICD-10-CM | POA: Diagnosis not present

## 2019-07-26 DIAGNOSIS — M6281 Muscle weakness (generalized): Secondary | ICD-10-CM

## 2019-07-26 DIAGNOSIS — M25562 Pain in left knee: Secondary | ICD-10-CM | POA: Diagnosis not present

## 2019-07-26 DIAGNOSIS — R2689 Other abnormalities of gait and mobility: Secondary | ICD-10-CM

## 2019-07-26 NOTE — Therapy (Signed)
[place], [place], [address] Phone: [phone]   Fax:  [phone]  Physical Therapy Treatment  Patient Details  Name: Mary Wise MRN: [id number] Date of Birth: 1951-04-19 Referring Provider (PT): Dr. Fredonia Highland   Encounter Date: 07/26/2019  PT End of Session - 07/26/19 1534    Visit Number  7    Number of Visits  18    Date for PT Re-Evaluation  08/19/19    Authorization Type  Primary: Medicare; Secondary: Medicaid    Authorization Time Period  07/08/19 - 08/19/19    Authorization - Visit Number  7    Authorization - Number of Visits  10    PT Start Time  [id number]   4' on bike, not included wiht charges   PT Stop Time  1532    PT Time Calculation (min)  46 min    Activity Tolerance  Patient tolerated treatment well;No increased pain    Behavior During Therapy  WFL for tasks assessed/performed       Past Medical History:  Diagnosis Date  . Anxiety   . Asthma   . Cancer [facility])    left breast cancer  . Family history of cancer   . Genetic testing 02/01/2018   Multi-Cancer panel (83 genes) @ [facility] - No pathogenic mutations detected  . Hemorrhoids   . History of radiation therapy 05/27/18- 06/23/18   Left Breast 40.05 Gy in 15 fractions, left breast boost 10 Gy in 5 fractions.   . Lipoma of left shoulder   . Migraine   . Migraine   . Osteopenia   . 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 ABDOMINAL HYSTERECTOMY    . APPENDECTOMY    . CESAREAN SECTION    . CHOLECYSTECTOMY    . ERCP    . IR FLUORO GUIDE PORT INSERTION RIGHT  10/05/2017  . IR REMOVAL TUN ACCESS W/ PORT W/O FL MOD SED  05/13/2018  . IR US GUIDE VASC ACCESS RIGHT  10/05/2017  . LIPOMA EXCISION Left 03/16/2018   left shoulder  . LIVER SURGERY    . PARTIAL MASTECTOMY WITH AXILLARY SENTINEL LYMPH NODE BIOPSY Left 07/31/2017   Procedure: LEFT PARTIAL MASTECTOMY WITH  AXILLARY SENTINEL LYMPH NODE BIOPSY;  Surgeon: Virl Cagey, MD;  Location: [facility];  Service: General;  Laterality: Left;  . TOTAL KNEE ARTHROPLASTY Left 07/05/2019   Procedure: TOTAL KNEE ARTHROPLASTY;  Surgeon: Renette Butters, MD;  Location: [facility];  Service: Orthopedics;  Laterality: Left;    There were no vitals filed for this visit.  Subjective Assessment - 07/26/19 1446    Subjective  Reports increased pain and edema following the "popping" during therapy yesterday.  Has contacted MD concerning the pop and increased pain.  CUrrent pain scale 6.5/10.    Pertinent History  LT TKA 07/05/19    Patient Stated Goals  Move better    Currently in Pain?  Yes    Pain Score  6                        [facility] Adult PT Treatment/Exercise - 07/26/19 0001      Knee/Hip Exercises: Stretches   Active Hamstring Stretch  Left;2 reps;30 seconds    Knee: Self-Stretch to increase Flexion  Left;5 reps;10 seconds    Knee: Self-Stretch Limitations  on 12''  step    Gastroc Stretch  Both;3 reps;30 seconds    Gastroc Stretch Limitations  Slant board      Knee/Hip Exercises: Aerobic   Recumbent Bike  4 min warm up for mobility; gentle rocking      Knee/Hip Exercises: Standing   Terminal Knee Extension  Left;15 reps    Theraband Level (Terminal Knee Extension)  --   purple  band   Gait Training  226 feet with no AD; SPC in hand; SBA      Knee/Hip Exercises: Supine   Quad Sets  Left;10 reps    Short Arc Quad Sets  Left;15 reps    Heel Slides  10 reps    Straight Leg Raises  5 reps;2 sets    Straight Leg Raises Limitations  extension lag    Knee Extension  AROM    Knee Extension Limitations  10    Knee Flexion  AROM    Knee Flexion Limitations  91      Manual Therapy   Manual Therapy  Myofascial release;Edema management    Manual therapy comments  Manual complete separate than rest of tx    Edema Management  Retrograde massage to LT LE with LE elevated    Myofascial Release   MFR to address scar tissue adhesions (wore gloves and avoided steri strips               PT Short Term Goals - 07/08/19 1541      PT SHORT TERM GOAL #1   Title  Patient will demonstrate understanding and report regular compliance with HEP to improve knee AROM, lower extremity strength, and overall functional mobility.    Time  3    Period  Weeks    Status  New    Target Date  07/29/19      PT SHORT TERM GOAL #2   Title  Patient will demonstrate left knee extension/flexion active range of motion of at least 5-80 degrees to assist with more normalized gait pattern and stair ambulation.    Time  3    Period  Weeks    Status  New    Target Date  07/29/19        PT Long Term Goals - 07/08/19 1542      PT LONG TERM GOAL #1   Title  Patient will perform single limb stance on left lower extremity for 3 seconds in order to assist with stair ambulation.    Time  6    Period  Weeks    Status  New    Target Date  08/19/19      PT LONG TERM GOAL #2   Title  Patient will demonstrate improvement of 1 MMT grade in all knee musculature tested as deficient at evaluation to assist with improvement in gait mechanics and stair ambulation.    Time  6    Period  Weeks    Status  New    Target Date  08/19/19      PT LONG TERM GOAL #3   Title  Patient will improve ROM for left knee extension/flexion to 0-105 degrees to improve squatting, and other functional mobility.    Time  6    Period  Weeks    Status  New    Target Date  08/19/19      PT LONG TERM GOAL #4   Title  Patient will demonstrate ability to ambulate on 2MWT with LRAD an improvement of 100 feet  indicating improved ability to ambulate safely household and limited community distances.    Time  6    Period  Weeks    Status  New    Target Date  08/19/19      PT LONG TERM GOAL #5   Title  Patient will report that she has been able to ambulate for at least 20 minutes without more than 2/10 pain in order to perform grocery  shopping with greater ease.    Time  6    Period  Weeks    Status  New    Target Date  08/19/19            Plan - 07/26/19 1542    Clinical Impression Statement  Pt limited by pain and edema initially this session.  Began session with manual for pain/edema control and additional myofascial techniques to address scar tissue adhesions.  Pt reports vast reduction in pain following manual.  Therex focus on quad strengthening and knee mobility.  Added TKE in supine for quad strengthening with some assistance required due to weakness.  ROM progressing well with ability to compete full revolution seat 14 in bike.  AROM 10-91 degrees (was 11-80 degrees last session.)  Pt reoprts pain reduced to 3/10 at EOS.    Personal Factors and Comorbidities  Age;Comorbidity 3+    Comorbidities  Hx Cancer, osteopenia, asthma    Examination-Activity Limitations  Stand;Locomotion Level;Bed Mobility;Bend;Transfers;Squat;Stairs    Examination-Participation Restrictions  Yard Work;Cleaning;Meal Prep;Community Activity;Driving;Shop;Laundry    Stability/Clinical Decision Making  Stable/Uncomplicated    Clinical Decision Making  Low    Rehab Potential  Good    PT Frequency  3x / week    PT Duration  6 weeks    PT Treatment/Interventions  ADLs/Self Care Home Management;Cryotherapy;Electrical Stimulation;DME Instruction;Gait training;Stair training;Functional mobility training;Therapeutic activities;Therapeutic exercise;Balance training;Neuromuscular re-education;Patient/family education;Orthotic Fit/Training;Manual techniques;Scar mobilization;Compression bandaging;Passive range of motion;Dry needling;Energy conservation;Taping    PT Next Visit Plan  add foam to tandem stance, increase step up to 6 in box    PT Home Exercise Plan  07/08/19: heel slides, quad sets, glute sets x10 2-3 x daily       Patient will benefit from skilled therapeutic intervention in order to improve the following deficits and impairments:   Abnormal gait, Increased fascial restricitons, Improper body mechanics, Pain, Decreased mobility, Decreased scar mobility, Decreased activity tolerance, Decreased endurance, Decreased range of motion, Decreased strength, Hypomobility, Decreased balance, Difficulty walking, Increased edema  Visit Diagnosis: Acute pain of left knee  Stiffness of left knee, not elsewhere classified  Muscle weakness (generalized)  Other abnormalities of gait and mobility     Problem List Patient Active Problem List   Diagnosis Date Noted  . Primary osteoarthritis of left knee 06/13/2019  . OA (osteoarthritis) of knee 05/18/2019  . Asthma 05/18/2019  . Hyperlipemia 05/18/2019  . Osteopenia 02/02/2019  . Muscle cramps 01/10/2019  . Peripheral neuropathy 10/11/2018  . Migraines 02/01/2018  . Insomnia 02/01/2018  . Genetic testing 02/01/2018  . Family history of cancer   . Port-A-Cath in place 10/12/2017  . Malignant neoplasm of upper-outer quadrant of left breast in female, estrogen receptor negative ([place])   . HTN, goal below 140/90 07/01/2017  . Anxiety 07/01/2017  . Lipoma of left shoulder 06/19/2017  . Left breast mass 06/19/2017   Ihor Austin, LPTA; [place]  Aldona Lento 07/26/2019, 7:09 PM  [facility] [place], [place], [address] Phone: [phone]  Fax:  [phone]  Name: Mary Wise MRN: [id number] Date of Birth: 25-Sep-1950

## 2019-07-27 ENCOUNTER — Ambulatory Visit (HOSPITAL_COMMUNITY): Payer: Medicare Other | Admitting: Physical Therapy

## 2019-07-27 ENCOUNTER — Other Ambulatory Visit: Payer: Self-pay

## 2019-07-27 ENCOUNTER — Encounter (HOSPITAL_COMMUNITY): Payer: Self-pay | Admitting: Physical Therapy

## 2019-07-27 DIAGNOSIS — R2689 Other abnormalities of gait and mobility: Secondary | ICD-10-CM | POA: Diagnosis not present

## 2019-07-27 DIAGNOSIS — M25562 Pain in left knee: Secondary | ICD-10-CM | POA: Diagnosis not present

## 2019-07-27 DIAGNOSIS — M25662 Stiffness of left knee, not elsewhere classified: Secondary | ICD-10-CM | POA: Diagnosis not present

## 2019-07-27 DIAGNOSIS — M6281 Muscle weakness (generalized): Secondary | ICD-10-CM

## 2019-07-27 NOTE — Therapy (Signed)
[place], [place], [address] Phone: [phone]   Fax:  [phone]  Physical Therapy Treatment  Patient Details  Name: Mary Wise MRN: [id number] Date of Birth: 19-Aug-1950 Referring Provider (PT): Dr. Fredonia Highland   Encounter Date: 07/27/2019  PT End of Session - 07/27/19 1443    Visit Number  8    Number of Visits  18    Date for PT Re-Evaluation  08/19/19    Authorization Type  Primary: Medicare; Secondary: Medicaid    Authorization Time Period  07/08/19 - 08/19/19    Authorization - Visit Number  8    Authorization - Number of Visits  10    PT Start Time  [id number]    PT Stop Time  1515    PT Time Calculation (min)  40 min    Activity Tolerance  Patient tolerated treatment well;No increased pain    Behavior During Therapy  WFL for tasks assessed/performed       Past Medical History:  Diagnosis Date  . Anxiety   . Asthma   . Cancer [facility])    left breast cancer  . Family history of cancer   . Genetic testing 02/01/2018   Multi-Cancer panel (83 genes) @ [facility] - No pathogenic mutations detected  . Hemorrhoids   . History of radiation therapy 05/27/18- 06/23/18   Left Breast 40.05 Gy in 15 fractions, left breast boost 10 Gy in 5 fractions.   . Lipoma of left shoulder   . Migraine   . Migraine   . Osteopenia   . 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 ABDOMINAL HYSTERECTOMY    . APPENDECTOMY    . CESAREAN SECTION    . CHOLECYSTECTOMY    . ERCP    . IR FLUORO GUIDE PORT INSERTION RIGHT  10/05/2017  . IR REMOVAL TUN ACCESS W/ PORT W/O FL MOD SED  05/13/2018  . IR US GUIDE VASC ACCESS RIGHT  10/05/2017  . LIPOMA EXCISION Left 03/16/2018   left shoulder  . LIVER SURGERY    . PARTIAL MASTECTOMY WITH AXILLARY SENTINEL LYMPH NODE BIOPSY Left 07/31/2017   Procedure: LEFT PARTIAL MASTECTOMY WITH AXILLARY SENTINEL LYMPH NODE BIOPSY;  Surgeon:  Virl Cagey, MD;  Location: [facility];  Service: General;  Laterality: Left;  . TOTAL KNEE ARTHROPLASTY Left 07/05/2019   Procedure: TOTAL KNEE ARTHROPLASTY;  Surgeon: Renette Butters, MD;  Location: [facility];  Service: Orthopedics;  Laterality: Left;    There were no vitals filed for this visit.  Subjective Assessment - 07/27/19 1441    Subjective  Patient says knee is hurting real bad today. Patient says it was giving her a fit since yesterday, and she had trouble sleeping because of it. Patient says the pain is in the bone today.    Pertinent History  LT TKA 07/05/19    Patient Stated Goals  Move better    Currently in Pain?  Yes    Pain Score  8     Pain Location  Knee    Pain Orientation  Left    Pain Descriptors / Indicators  Sore;Stabbing    Pain Type  Surgical pain    Pain Onset  1 to 4 weeks ago    Pain Frequency  Constant  [place] Adult PT Treatment/Exercise - 07/27/19 0001      Knee/Hip Exercises: Stretches   Active Hamstring Stretch  Left;2 reps;30 seconds    Knee: Self-Stretch to increase Flexion  Left;5 reps;10 seconds    Knee: Self-Stretch Limitations  on 12'' step    Gastroc Stretch  Both;3 reps;30 seconds    Gastroc Stretch Limitations  Slant board      Knee/Hip Exercises: Aerobic   Recumbent Bike  4 min warm up for mobility; gentle rocking      Knee/Hip Exercises: Standing   Terminal Knee Extension  Left;15 reps    Theraband Level (Terminal Knee Extension)  --   purple   Gait Training  226 feet with SPC; SBA      Knee/Hip Exercises: Supine   Heel Slides  Left;10 reps    Straight Leg Raises  5 reps;2 sets    Knee Extension  AROM    Knee Extension Limitations  7    Knee Flexion  AROM    Knee Flexion Limitations  90      Manual Therapy   Manual Therapy  Edema management    Manual therapy comments  Manual complete separate than rest of tx    Edema Management  Retrograde massage to LT LE with LE elevated                PT Short Term Goals - 07/08/19 1541      PT SHORT TERM GOAL #1   Title  Patient will demonstrate understanding and report regular compliance with HEP to improve knee AROM, lower extremity strength, and overall functional mobility.    Time  3    Period  Weeks    Status  New    Target Date  07/29/19      PT SHORT TERM GOAL #2   Title  Patient will demonstrate left knee extension/flexion active range of motion of at least 5-80 degrees to assist with more normalized gait pattern and stair ambulation.    Time  3    Period  Weeks    Status  New    Target Date  07/29/19        PT Long Term Goals - 07/08/19 1542      PT LONG TERM GOAL #1   Title  Patient will perform single limb stance on left lower extremity for 3 seconds in order to assist with stair ambulation.    Time  6    Period  Weeks    Status  New    Target Date  08/19/19      PT LONG TERM GOAL #2   Title  Patient will demonstrate improvement of 1 MMT grade in all knee musculature tested as deficient at evaluation to assist with improvement in gait mechanics and stair ambulation.    Time  6    Period  Weeks    Status  New    Target Date  08/19/19      PT LONG TERM GOAL #3   Title  Patient will improve ROM for left knee extension/flexion to 0-105 degrees to improve squatting, and other functional mobility.    Time  6    Period  Weeks    Status  New    Target Date  08/19/19      PT LONG TERM GOAL #4   Title  Patient will demonstrate ability to ambulate on 2MWT with LRAD an improvement of 100 feet indicating improved ability to ambulate safely household and  limited community distances.    Time  6    Period  Weeks    Status  New    Target Date  08/19/19      PT LONG TERM GOAL #5   Title  Patient will report that she has been able to ambulate for at least 20 minutes without more than 2/10 pain in order to perform grocery shopping with greater ease.    Time  6    Period  Weeks    Status  New     Target Date  08/19/19            Plan - 07/27/19 1632    Clinical Impression Statement  Patient had slightly more pain with treatment today, initially, but improved as session progressed. Gait training performed with SPC today due to increased pain and swelling in knee. Patient was able to complete several revolutions on recumbant bike today, but was limited in knee AROm due to increased pain and swelling. Activity graded per patient tolerance today. Patient did report decreased pain post manual treatment.    Personal Factors and Comorbidities  Age;Comorbidity 3+    Comorbidities  Hx Cancer, osteopenia, asthma    Examination-Activity Limitations  Stand;Locomotion Level;Bed Mobility;Bend;Transfers;Squat;Stairs    Examination-Participation Restrictions  Yard Work;Cleaning;Meal Prep;Community Activity;Driving;Shop;Laundry    Stability/Clinical Decision Making  Stable/Uncomplicated    Rehab Potential  Good    PT Frequency  3x / week    PT Duration  6 weeks    PT Treatment/Interventions  ADLs/Self Care Home Management;Cryotherapy;Electrical Stimulation;DME Instruction;Gait training;Stair training;Functional mobility training;Therapeutic activities;Therapeutic exercise;Balance training;Neuromuscular re-education;Patient/family education;Orthotic Fit/Training;Manual techniques;Scar mobilization;Compression bandaging;Passive range of motion;Dry needling;Energy conservation;Taping    PT Next Visit Plan  add foam to tandem stance, increase step up to 6 in box    PT Home Exercise Plan  07/08/19: heel slides, quad sets, glute sets x10 2-3 x daily       Patient will benefit from skilled therapeutic intervention in order to improve the following deficits and impairments:  Abnormal gait, Increased fascial restricitons, Improper body mechanics, Pain, Decreased mobility, Decreased scar mobility, Decreased activity tolerance, Decreased endurance, Decreased range of motion, Decreased strength, Hypomobility,  Decreased balance, Difficulty walking, Increased edema  Visit Diagnosis: Acute pain of left knee  Stiffness of left knee, not elsewhere classified  Muscle weakness (generalized)  Other abnormalities of gait and mobility     Problem List Patient Active Problem List   Diagnosis Date Noted  . Primary osteoarthritis of left knee 06/13/2019  . OA (osteoarthritis) of knee 05/18/2019  . Asthma 05/18/2019  . Hyperlipemia 05/18/2019  . Osteopenia 02/02/2019  . Muscle cramps 01/10/2019  . Peripheral neuropathy 10/11/2018  . Migraines 02/01/2018  . Insomnia 02/01/2018  . Genetic testing 02/01/2018  . Family history of cancer   . Port-A-Cath in place 10/12/2017  . Malignant neoplasm of upper-outer quadrant of left breast in female, estrogen receptor negative ([place])   . HTN, goal below 140/90 07/01/2017  . Anxiety 07/01/2017  . Lipoma of left shoulder 06/19/2017  . Left breast mass 06/19/2017   4:35 PM, 07/27/19 Josue Hector PT DPT  Physical Therapist with [facility]  [phone] [place] [address] [place], [place], [address] Phone: [phone]   Fax:  [phone]  Name: Mary Wise MRN: [id number] Date of Birth: 26-May-1951

## 2019-08-01 ENCOUNTER — Ambulatory Visit (HOSPITAL_COMMUNITY): Payer: Medicare Other | Admitting: Physical Therapy

## 2019-08-01 ENCOUNTER — Other Ambulatory Visit: Payer: Self-pay

## 2019-08-01 ENCOUNTER — Encounter (HOSPITAL_COMMUNITY): Payer: Self-pay | Admitting: Physical Therapy

## 2019-08-01 DIAGNOSIS — M6281 Muscle weakness (generalized): Secondary | ICD-10-CM

## 2019-08-01 DIAGNOSIS — R2689 Other abnormalities of gait and mobility: Secondary | ICD-10-CM | POA: Diagnosis not present

## 2019-08-01 DIAGNOSIS — M25562 Pain in left knee: Secondary | ICD-10-CM

## 2019-08-01 DIAGNOSIS — M25662 Stiffness of left knee, not elsewhere classified: Secondary | ICD-10-CM | POA: Diagnosis not present

## 2019-08-01 NOTE — Therapy (Signed)
[place], [place], [address] Phone: [phone]   Fax:  [phone]  Physical Therapy Treatment  Patient Details  Name: Mary Wise MRN: [id number] Date of Birth: 1951/04/30 Referring Provider (PT): Dr. Fredonia Highland   Encounter Date: 08/01/2019  PT End of Session - 08/01/19 1436    Visit Number  9    Number of Visits  18    Date for PT Re-Evaluation  08/19/19    Authorization Type  Primary: Medicare; Secondary: Medicaid    Authorization Time Period  07/08/19 - 08/19/19    Authorization - Visit Number  9    Authorization - Number of Visits  10    PT Start Time  1430    PT Stop Time  1515    PT Time Calculation (min)  45 min    Activity Tolerance  Patient tolerated treatment well;No increased pain    Behavior During Therapy  WFL for tasks assessed/performed       Past Medical History:  Diagnosis Date  . Anxiety   . Asthma   . Cancer [facility])    left breast cancer  . Family history of cancer   . Genetic testing 02/01/2018   Multi-Cancer panel (83 genes) @ [facility] - No pathogenic mutations detected  . Hemorrhoids   . History of radiation therapy 05/27/18- 06/23/18   Left Breast 40.05 Gy in 15 fractions, left breast boost 10 Gy in 5 fractions.   . Lipoma of left shoulder   . Migraine   . Migraine   . Osteopenia   . 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 ABDOMINAL HYSTERECTOMY    . APPENDECTOMY    . CESAREAN SECTION    . CHOLECYSTECTOMY    . ERCP    . IR FLUORO GUIDE PORT INSERTION RIGHT  10/05/2017  . IR REMOVAL TUN ACCESS W/ PORT W/O FL MOD SED  05/13/2018  . IR US GUIDE VASC ACCESS RIGHT  10/05/2017  . LIPOMA EXCISION Left 03/16/2018   left shoulder  . LIVER SURGERY    . PARTIAL MASTECTOMY WITH AXILLARY SENTINEL LYMPH NODE BIOPSY Left 07/31/2017   Procedure: LEFT PARTIAL MASTECTOMY WITH AXILLARY SENTINEL LYMPH NODE BIOPSY;  Surgeon:  Virl Cagey, MD;  Location: [facility];  Service: General;  Laterality: Left;  . TOTAL KNEE ARTHROPLASTY Left 07/05/2019   Procedure: TOTAL KNEE ARTHROPLASTY;  Surgeon: Renette Butters, MD;  Location: [facility];  Service: Orthopedics;  Laterality: Left;    There were no vitals filed for this visit.  Subjective Assessment - 08/01/19 1434    Subjective  Patient says knee is tight today and feels like it is "drawing up inside". Patient says she has been using her cane more lately because she is afraid knee with pop and give out.    Pertinent History  LT TKA 07/05/19    Patient Stated Goals  Move better    Currently in Pain?  Yes    Pain Score  5     Pain Location  Knee    Pain Orientation  Left    Pain Descriptors / Indicators  Tightness;Sore    Pain Onset  1 to 4 weeks ago                       [facility] Adult PT Treatment/Exercise - 08/01/19 0001  Knee/Hip Exercises: Stretches   Knee: Self-Stretch to increase Flexion  Left;5 reps;10 seconds    Knee: Self-Stretch Limitations  on 12'' step    Gastroc Stretch  Both;3 reps;30 seconds    Gastroc Stretch Limitations  Slant board      Knee/Hip Exercises: Aerobic   Recumbent Bike  4 min warm up for mobility; gentle rocking      Knee/Hip Exercises: Standing   Terminal Knee Extension  Left;15 reps    Theraband Level (Terminal Knee Extension)  Other (comment)   purple   Forward Step Up  Left;15 reps;Hand Hold: 1;Step Height: 4"    Step Down  Left;15 reps;Hand Hold: 2;Step Height: 4"    Gait Training  226 feet with no AD; SBA    Other Standing Knee Exercises  tandem stance on foam; 3 x 20"      Knee/Hip Exercises: Supine   Knee Extension  AROM    Knee Extension Limitations  9    Knee Flexion  AROM    Knee Flexion Limitations  90      Manual Therapy   Manual Therapy  Edema management;Myofascial release    Manual therapy comments  Manual complete separate than rest of tx    Edema Management  Retrograde massage to LT  LE with LE elevated    Myofascial Release  scar tissue message               PT Short Term Goals - 07/08/19 1541      PT SHORT TERM GOAL #1   Title  Patient will demonstrate understanding and report regular compliance with HEP to improve knee AROM, lower extremity strength, and overall functional mobility.    Time  3    Period  Weeks    Status  New    Target Date  07/29/19      PT SHORT TERM GOAL #2   Title  Patient will demonstrate left knee extension/flexion active range of motion of at least 5-80 degrees to assist with more normalized gait pattern and stair ambulation.    Time  3    Period  Weeks    Status  New    Target Date  07/29/19        PT Long Term Goals - 07/08/19 1542      PT LONG TERM GOAL #1   Title  Patient will perform single limb stance on left lower extremity for 3 seconds in order to assist with stair ambulation.    Time  6    Period  Weeks    Status  New    Target Date  08/19/19      PT LONG TERM GOAL #2   Title  Patient will demonstrate improvement of 1 MMT grade in all knee musculature tested as deficient at evaluation to assist with improvement in gait mechanics and stair ambulation.    Time  6    Period  Weeks    Status  New    Target Date  08/19/19      PT LONG TERM GOAL #3   Title  Patient will improve ROM for left knee extension/flexion to 0-105 degrees to improve squatting, and other functional mobility.    Time  6    Period  Weeks    Status  New    Target Date  08/19/19      PT LONG TERM GOAL #4   Title  Patient will demonstrate ability to ambulate on 2MWT with LRAD an  improvement of 100 feet indicating improved ability to ambulate safely household and limited community distances.    Time  6    Period  Weeks    Status  New    Target Date  08/19/19      PT LONG TERM GOAL #5   Title  Patient will report that she has been able to ambulate for at least 20 minutes without more than 2/10 pain in order to perform grocery shopping  with greater ease.    Time  6    Period  Weeks    Status  New    Target Date  08/19/19            Plan - 08/01/19 1636    Clinical Impression Statement  Patient tolerated activity better today, and was able to perform all exercise with less pain than previous session. Patient continues to be limited in knee AROM, which continues to limit functional mobility and contribute to pain. Added scar tissue message to areas of incision that are well healed, and educated patient on application, to reduce scar tissue adhesions and improve knee mobility. Patient was able to progress gait training to no AD, with verbal cues for heel to toe transition.    Personal Factors and Comorbidities  Age;Comorbidity 3+    Comorbidities  Hx Cancer, osteopenia, asthma    Examination-Activity Limitations  Stand;Locomotion Level;Bed Mobility;Bend;Transfers;Squat;Stairs    Examination-Participation Restrictions  Yard Work;Cleaning;Meal Prep;Community Activity;Driving;Shop;Laundry    Stability/Clinical Decision Making  Stable/Uncomplicated    Rehab Potential  Good    PT Frequency  3x / week    PT Duration  6 weeks    PT Treatment/Interventions  ADLs/Self Care Home Management;Cryotherapy;Electrical Stimulation;DME Instruction;Gait training;Stair training;Functional mobility training;Therapeutic activities;Therapeutic exercise;Balance training;Neuromuscular re-education;Patient/family education;Orthotic Fit/Training;Manual techniques;Scar mobilization;Compression bandaging;Passive range of motion;Dry needling;Energy conservation;Taping    PT Next Visit Plan  Continue to progress LT knee AROM and strength as tolerated.    PT Home Exercise Plan  07/08/19: heel slides, quad sets, glute sets x10 2-3 x daily       Patient will benefit from skilled therapeutic intervention in order to improve the following deficits and impairments:  Abnormal gait, Increased fascial restricitons, Improper body mechanics, Pain, Decreased  mobility, Decreased scar mobility, Decreased activity tolerance, Decreased endurance, Decreased range of motion, Decreased strength, Hypomobility, Decreased balance, Difficulty walking, Increased edema  Visit Diagnosis: Acute pain of left knee  Stiffness of left knee, not elsewhere classified  Muscle weakness (generalized)  Other abnormalities of gait and mobility     Problem List Patient Active Problem List   Diagnosis Date Noted  . Primary osteoarthritis of left knee 06/13/2019  . OA (osteoarthritis) of knee 05/18/2019  . Asthma 05/18/2019  . Hyperlipemia 05/18/2019  . Osteopenia 02/02/2019  . Muscle cramps 01/10/2019  . Peripheral neuropathy 10/11/2018  . Migraines 02/01/2018  . Insomnia 02/01/2018  . Genetic testing 02/01/2018  . Family history of cancer   . Port-A-Cath in place 10/12/2017  . Malignant neoplasm of upper-outer quadrant of left breast in female, estrogen receptor negative ([place])   . HTN, goal below 140/90 07/01/2017  . Anxiety 07/01/2017  . Lipoma of left shoulder 06/19/2017  . Left breast mass 06/19/2017    4:41 PM, 08/01/19 Josue Hector PT DPT  Physical Therapist with [facility]  [phone] [place] [address] [place], [place], [address] Phone: [phone]   Fax:  [phone]  Name: Mary  SOPHIAMARIE Wise MRN: [id number] Date of Birth: [birth year]

## 2019-08-03 ENCOUNTER — Encounter (HOSPITAL_COMMUNITY): Payer: Self-pay | Admitting: Physical Therapy

## 2019-08-03 ENCOUNTER — Ambulatory Visit (HOSPITAL_COMMUNITY): Payer: Medicare Other | Admitting: Physical Therapy

## 2019-08-03 ENCOUNTER — Other Ambulatory Visit: Payer: Self-pay

## 2019-08-03 DIAGNOSIS — M6281 Muscle weakness (generalized): Secondary | ICD-10-CM

## 2019-08-03 DIAGNOSIS — R2689 Other abnormalities of gait and mobility: Secondary | ICD-10-CM

## 2019-08-03 DIAGNOSIS — M25562 Pain in left knee: Secondary | ICD-10-CM | POA: Diagnosis not present

## 2019-08-03 DIAGNOSIS — M25662 Stiffness of left knee, not elsewhere classified: Secondary | ICD-10-CM | POA: Diagnosis not present

## 2019-08-03 NOTE — Therapy (Signed)
[place] [address] [place], [place], [address] Phone: [phone]   Fax:  [phone]  Physical Therapy Treatment/ Progress Note  Patient Details  Name: Mary Wise MRN: [id number] Date of Birth: [birth year] Referring Provider (PT): Dr. Fredonia Highland   Encounter Date: 08/03/2019   Progress Note Reporting Period 07/08/19 to 08/03/19  See note below for Objective Data and Assessment of Progress/Goals.     PT End of Session - 08/03/19 1445    Visit Number  10    Number of Visits  18    Date for PT Re-Evaluation  08/19/19   PN complete 12/30   Authorization Type  Primary: Medicare; Secondary: Medicaid    Authorization Time Period  07/08/19 - 08/19/19    Authorization - Visit Number  10    Authorization - Number of Visits  10    PT Start Time  1430    PT Stop Time  1520    PT Time Calculation (min)  50 min    Equipment Utilized During Treatment  Gait belt    Activity Tolerance  Patient tolerated treatment well    Behavior During Therapy  WFL for tasks assessed/performed       Past Medical History:  Diagnosis Date  . Anxiety   . Asthma   . Cancer [facility])    left breast cancer  . Family history of cancer   . Genetic testing 02/01/2018   Multi-Cancer panel (83 genes) @ [facility] - No pathogenic mutations detected  . Hemorrhoids   . History of radiation therapy 05/27/18- 06/23/18   Left Breast 40.05 Gy in 15 fractions, left breast boost 10 Gy in 5 fractions.   . Lipoma of left shoulder   . Migraine   . Migraine   . Osteopenia   . 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 ABDOMINAL HYSTERECTOMY    . APPENDECTOMY    . CESAREAN SECTION    . CHOLECYSTECTOMY    . ERCP    . IR FLUORO GUIDE PORT INSERTION RIGHT  10/05/2017  . IR REMOVAL TUN ACCESS W/ PORT W/O FL MOD SED  05/13/2018  . IR US GUIDE VASC ACCESS RIGHT  10/05/2017  . LIPOMA EXCISION Left 03/16/2018   left shoulder  . LIVER SURGERY    . PARTIAL MASTECTOMY WITH AXILLARY SENTINEL LYMPH NODE BIOPSY Left 07/31/2017   Procedure: LEFT PARTIAL MASTECTOMY WITH AXILLARY SENTINEL LYMPH NODE BIOPSY;  Surgeon: Virl Cagey, MD;  Location: [facility];  Service: General;  Laterality: Left;  . TOTAL KNEE ARTHROPLASTY Left 07/05/2019   Procedure: TOTAL KNEE ARTHROPLASTY;  Surgeon: Renette Butters, MD;  Location: [facility];  Service: Orthopedics;  Laterality: Left;    There were no vitals filed for this visit.  Subjective Assessment - 08/03/19 1437    Subjective  Patient says knee is very tight today. She says she woke up early this morning with a lot of pain and had to take a muscle relaxer.    Pertinent History  LT TKA 07/05/19    How long can you stand comfortably?  6-7 min    How long can you walk comfortably?  10 min with cane    Patient Stated Goals  Move better    Currently in Pain?  Yes    Pain Score  4     Pain Location  Knee    Pain Orientation  Left    Pain Descriptors / Indicators  Tightness;Aching    Pain Type  Surgical pain    Pain Onset  1 to 4 weeks ago    Pain Frequency  Constant    Aggravating Factors   standing, walking    Pain Relieving Factors  Rest, meds    Effect of Pain on Daily Activities  Limits         [facility] PT Assessment - 08/03/19 0001      Assessment   Medical Diagnosis  LT TKA    Referring Provider (PT)  Dr. Fredonia Highland    Onset Date/Surgical Date  07/05/19      Precautions   Precautions  Fall      Restrictions   Weight Bearing Restrictions  No      Home Environment   Living Environment  Private residence      Prior Function   Level of Independence  Independent      Cognition   Overall Cognitive Status  Within Functional Limits for tasks assessed      Observation/Other Assessments   Observations  Incicison appears well healed and is intact    Focus on Therapeutic Outcomes (FOTO)   32% limited   was 57%     AROM   Left Knee Extension  8   was  23   Left Knee Flexion  90   was 60     Strength   Right Hip Flexion  5/5   was 4   Right Hip Extension  4/5   was 3   Right Hip ABduction  4/5   was 2+   Left Hip Flexion  4/5   was 2-   Left Hip Extension  4/5   was 2+   Left Hip ABduction  4/5   was 2+   Right Knee Flexion  5/5    Right Knee Extension  5/5    Left Knee Flexion  4+/5   was 2+   Left Knee Extension  4+/5   was 2+   Right Ankle Dorsiflexion  5/5    Left Ankle Dorsiflexion  5/5   was 4-     Ambulation/Gait   Ambulation/Gait  Yes    Ambulation Distance (Feet)  335 Feet    Assistive device  None    Gait Pattern  Decreased step length - left    Ambulation Surface  Level;Indoor    Gait Comments  2MWT      Static Standing Balance   Static Standing Balance -  Activities   Single Leg Stance - Right Leg;Single Leg Stance - Left Leg    Static Standing - Comment/# of Minutes  12 sec; 7 sec                   [facility] Adult PT Treatment/Exercise - 08/03/19 0001      Knee/Hip Exercises: Stretches   Knee: Self-Stretch to increase Flexion  Left;5 reps;10 seconds    Knee: Self-Stretch Limitations  on 12'' step    Gastroc Stretch  Both;3 reps;30 seconds    Gastroc Stretch Limitations  Slant board      Knee/Hip Exercises: Aerobic   Recumbent Bike  4 min warm up for mobility; gentle rocking      Knee/Hip Exercises: Standing   Forward Step Up  Left;15 reps;Hand Hold: 1;Step Height: 6"    Step Down  Left;15 reps;Hand Hold: 2;Step Height: 6"      Knee/Hip Exercises: Supine   Heel  Slides  Left;10 reps    Knee Extension  AROM    Knee Extension Limitations  8    Knee Flexion  AROM    Knee Flexion Limitations  90    Other Supine Knee/Hip Exercises  heel prop, 2 min      Manual Therapy   Manual Therapy  Edema management;Myofascial release    Manual therapy comments  Manual complete separate than rest of tx    Edema Management  Retrograde massage to LT LE with LE elevated    Myofascial Release  scar  tissue message             PT Education - 08/03/19 1441    Education Details  Educated on reassessment findings and POC    Person(s) Educated  Patient    Methods  Explanation    Comprehension  Verbalized understanding       PT Short Term Goals - 08/03/19 1549      PT SHORT TERM GOAL #1   Title  Patient will demonstrate understanding and report regular compliance with HEP to improve knee AROM, lower extremity strength, and overall functional mobility.    Time  3    Period  Weeks    Status  Achieved    Target Date  07/29/19      PT SHORT TERM GOAL #2   Title  Patient will demonstrate left knee extension/flexion active range of motion of at least 5-80 degrees to assist with more normalized gait pattern and stair ambulation.    Baseline  12/30: 8-90 degrees    Time  3    Period  Weeks    Status  Partially Met    Target Date  07/29/19        PT Long Term Goals - 08/03/19 1549      PT LONG TERM GOAL #1   Title  Patient will perform single limb stance on left lower extremity for 3 seconds in order to assist with stair ambulation.    Time  6    Period  Weeks    Status  Achieved      PT LONG TERM GOAL #2   Title  Patient will demonstrate improvement of 1 MMT grade in all knee musculature tested as deficient at evaluation to assist with improvement in gait mechanics and stair ambulation.    Time  6    Period  Weeks    Status  Achieved      PT LONG TERM GOAL #3   Title  Patient will improve ROM for left knee extension/flexion to 0-105 degrees to improve squatting, and other functional mobility.    Baseline  12/30: 8-90 deg    Time  6    Period  Weeks    Status  On-going      PT LONG TERM GOAL #4   Title  Patient will demonstrate ability to ambulate on 2MWT with LRAD an improvement of 100 feet indicating improved ability to ambulate safely household and limited community distances.    Time  6    Period  Weeks    Status  Achieved      PT LONG TERM GOAL #5   Title   Patient will report that she has been able to ambulate for at least 20 minutes without more than 2/10 pain in order to perform grocery shopping with greater ease.    Time  6    Period  Weeks    Status  On-going  Plan - 08/03/19 1544    Clinical Impression Statement  Patient is progressing well to LTGs. Patient still limited by mild weakness, balance deficit, moderate AROM restrictions and pain which continue to limit function. Patient has improved in overall tolerance to activity, gait and stair ambulation. Patient will continue to benefit from skilled therapy services to address remaining deficits to reduce pain and improve level of function with ADLs and functional mobility tasks.    Personal Factors and Comorbidities  Age;Comorbidity 3+    Comorbidities  Hx Cancer, osteopenia, asthma    Examination-Activity Limitations  Stand;Locomotion Level;Bed Mobility;Bend;Transfers;Squat;Stairs    Examination-Participation Restrictions  Yard Work;Cleaning;Meal Prep;Community Activity;Driving;Shop;Laundry    Stability/Clinical Decision Making  Stable/Uncomplicated    Rehab Potential  Good    PT Frequency  3x / week    PT Duration  2 weeks    PT Treatment/Interventions  ADLs/Self Care Home Management;Cryotherapy;Electrical Stimulation;DME Instruction;Gait training;Stair training;Functional mobility training;Therapeutic activities;Therapeutic exercise;Balance training;Neuromuscular re-education;Patient/family education;Orthotic Fit/Training;Manual techniques;Scar mobilization;Compression bandaging;Passive range of motion;Dry needling;Energy conservation;Taping    PT Next Visit Plan  Continue to progress LT knee AROM and strength as tolerated. Add stair ambulation next visit    PT Home Exercise Plan  07/08/19: heel slides, quad sets, glute sets x10 2-3 x daily    Consulted and Agree with Plan of Care  Patient       Patient will benefit from skilled therapeutic intervention in order to  improve the following deficits and impairments:  Abnormal gait, Increased fascial restricitons, Improper body mechanics, Pain, Decreased mobility, Decreased scar mobility, Decreased activity tolerance, Decreased endurance, Decreased range of motion, Decreased strength, Hypomobility, Decreased balance, Difficulty walking, Increased edema  Visit Diagnosis: Stiffness of left knee, not elsewhere classified  Muscle weakness (generalized)  Other abnormalities of gait and mobility     Problem List Patient Active Problem List   Diagnosis Date Noted  . Primary osteoarthritis of left knee 06/13/2019  . OA (osteoarthritis) of knee 05/18/2019  . Asthma 05/18/2019  . Hyperlipemia 05/18/2019  . Osteopenia 02/02/2019  . Muscle cramps 01/10/2019  . Peripheral neuropathy 10/11/2018  . Migraines 02/01/2018  . Insomnia 02/01/2018  . Genetic testing 02/01/2018  . Family history of cancer   . Port-A-Cath in place 10/12/2017  . Malignant neoplasm of upper-outer quadrant of left breast in female, estrogen receptor negative ([place])   . HTN, goal below 140/90 07/01/2017  . Anxiety 07/01/2017  . Lipoma of left shoulder 06/19/2017  . Left breast mass 06/19/2017   3:56 PM, 08/03/19 Josue Hector PT DPT  Physical Therapist with [facility]  [phone] [place] [address] [place], [place], [address] Phone: [phone]   Fax:  [phone]  Name: AALIAYAH MIAO MRN: [id number] Date of Birth: 1950/11/08

## 2019-08-08 ENCOUNTER — Ambulatory Visit (HOSPITAL_COMMUNITY): Payer: Medicare Other | Attending: Orthopedic Surgery | Admitting: Physical Therapy

## 2019-08-08 ENCOUNTER — Encounter (HOSPITAL_COMMUNITY): Payer: Self-pay | Admitting: Physical Therapy

## 2019-08-08 ENCOUNTER — Other Ambulatory Visit: Payer: Self-pay

## 2019-08-08 DIAGNOSIS — M25562 Pain in left knee: Secondary | ICD-10-CM

## 2019-08-08 DIAGNOSIS — M25662 Stiffness of left knee, not elsewhere classified: Secondary | ICD-10-CM | POA: Diagnosis not present

## 2019-08-08 DIAGNOSIS — M6281 Muscle weakness (generalized): Secondary | ICD-10-CM

## 2019-08-08 DIAGNOSIS — R2689 Other abnormalities of gait and mobility: Secondary | ICD-10-CM | POA: Diagnosis not present

## 2019-08-08 NOTE — Therapy (Signed)
[place], [place], [address] Phone: [phone]   Fax:  [phone]  Physical Therapy Treatment  Patient Details  Name: Mary Wise MRN: [id number] Date of Birth: 03/02/1951 Referring Provider (PT): Dr. Fredonia Highland   Encounter Date: 08/08/2019  PT End of Session - 08/08/19 1445    Visit Number  11    Number of Visits  18    Date for PT Re-Evaluation  08/19/19   PN complete 12/30   Authorization Type  Primary: Medicare; Secondary: Medicaid    Authorization Time Period  07/08/19 - 08/19/19    Authorization - Visit Number  1    Authorization - Number of Visits  10    PT Start Time  1430    PT Stop Time  1515    PT Time Calculation (min)  45 min    Equipment Utilized During Treatment  Gait belt    Activity Tolerance  Patient tolerated treatment well    Behavior During Therapy  [facility] for tasks assessed/performed       Past Medical History:  Diagnosis Date  . Anxiety   . Asthma   . Cancer [facility])    left breast cancer  . Family history of cancer   . Genetic testing 02/01/2018   Multi-Cancer panel (83 genes) @ [facility] - No pathogenic mutations detected  . Hemorrhoids   . History of radiation therapy 05/27/18- 06/23/18   Left Breast 40.05 Gy in 15 fractions, left breast boost 10 Gy in 5 fractions.   . Lipoma of left shoulder   . Migraine   . Migraine   . Osteopenia   . 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 ABDOMINAL HYSTERECTOMY    . APPENDECTOMY    . CESAREAN SECTION    . CHOLECYSTECTOMY    . ERCP    . IR FLUORO GUIDE PORT INSERTION RIGHT  10/05/2017  . IR REMOVAL TUN ACCESS W/ PORT W/O FL MOD SED  05/13/2018  . IR US GUIDE VASC ACCESS RIGHT  10/05/2017  . LIPOMA EXCISION Left 03/16/2018   left shoulder  . LIVER SURGERY    . PARTIAL MASTECTOMY WITH AXILLARY SENTINEL LYMPH NODE BIOPSY Left 07/31/2017   Procedure: LEFT PARTIAL MASTECTOMY  WITH AXILLARY SENTINEL LYMPH NODE BIOPSY;  Surgeon: Virl Cagey, MD;  Location: [facility];  Service: General;  Laterality: Left;  . TOTAL KNEE ARTHROPLASTY Left 07/05/2019   Procedure: TOTAL KNEE ARTHROPLASTY;  Surgeon: Renette Butters, MD;  Location: [facility];  Service: Orthopedics;  Laterality: Left;    There were no vitals filed for this visit.  Subjective Assessment - 08/08/19 1442    Subjective  Patient says she has been doing much better since last visit. She says she has been doping her exercises every day as instructed and can tell her knee is moving better. Patient says she has also been watching her salt intake to reduce swelling and noticed improvement with this as well. Patient reports no pain currently.    Pertinent History  LT TKA 07/05/19    How long can you stand comfortably?  6-7 min    How long can you walk comfortably?  10 min with cane    Patient Stated Goals  Move better    Currently in Pain?  No/denies    Pain Onset  1 to 4 weeks ago  [place] Adult PT Treatment/Exercise - 08/08/19 0001      Knee/Hip Exercises: Stretches   Knee: Self-Stretch to increase Flexion  Left;5 reps;10 seconds    Knee: Self-Stretch Limitations  on 12'' step    Gastroc Stretch  Both;3 reps;30 seconds    Gastroc Stretch Limitations  Slant board      Knee/Hip Exercises: Aerobic   Recumbent Bike  4 min warm up for mobility      Knee/Hip Exercises: Standing   Terminal Knee Extension  Left;15 reps    Theraband Level (Terminal Knee Extension)  Other (comment)    Terminal Knee Extension Limitations  purple    Stairs  5RT, 7 in, single hand rail, reciprocal gait    Gait Training  226 feet with no AD; SBA    Other Standing Knee Exercises  tandem stance on foam; 3 x 20"    Other Standing Knee Exercises  Band sidestepping, 2RT, red      Knee/Hip Exercises: Seated   Sit to Sand  2 sets;10 reps;without UE support      Knee/Hip Exercises: Supine   Knee  Extension  AROM    Knee Extension Limitations  5    Knee Flexion  AROM    Knee Flexion Limitations  104      Manual Therapy   Manual Therapy  Edema management;Myofascial release    Manual therapy comments  Manual complete separate than rest of tx    Edema Management  Retrograde massage to LT LE with LE elevated    Myofascial Release  scar tissue message               PT Short Term Goals - 08/03/19 1549      PT SHORT TERM GOAL #1   Title  Patient will demonstrate understanding and report regular compliance with HEP to improve knee AROM, lower extremity strength, and overall functional mobility.    Time  3    Period  Weeks    Status  Achieved    Target Date  07/29/19      PT SHORT TERM GOAL #2   Title  Patient will demonstrate left knee extension/flexion active range of motion of at least 5-80 degrees to assist with more normalized gait pattern and stair ambulation.    Baseline  12/30: 8-90 degrees    Time  3    Period  Weeks    Status  Partially Met    Target Date  07/29/19        PT Long Term Goals - 08/03/19 1549      PT LONG TERM GOAL #1   Title  Patient will perform single limb stance on left lower extremity for 3 seconds in order to assist with stair ambulation.    Time  6    Period  Weeks    Status  Achieved      PT LONG TERM GOAL #2   Title  Patient will demonstrate improvement of 1 MMT grade in all knee musculature tested as deficient at evaluation to assist with improvement in gait mechanics and stair ambulation.    Time  6    Period  Weeks    Status  Achieved      PT LONG TERM GOAL #3   Title  Patient will improve ROM for left knee extension/flexion to 0-105 degrees to improve squatting, and other functional mobility.    Baseline  12/30: 8-90 deg    Time  6    Period  Weeks    Status  On-going      PT LONG TERM GOAL #4   Title  Patient will demonstrate ability to ambulate on 2MWT with LRAD an improvement of 100 feet indicating improved ability  to ambulate safely household and limited community distances.    Time  6    Period  Weeks    Status  Achieved      PT LONG TERM GOAL #5   Title  Patient will report that she has been able to ambulate for at least 20 minutes without more than 2/10 pain in order to perform grocery shopping with greater ease.    Time  6    Period  Weeks    Status  On-going            Plan - 08/08/19 1534    Clinical Impression Statement  Patient with significant improvement in AROM measures at today's visit. Likely due to combination of patient adherence to increased HEP frequency and possibly of dietary modifications, as patient states lowering her salt intake has improved her pain and swelling in knee. Patient was able to perform full revolutions on recumbent bike with improved ease, and was able to perform stair ambulation with reciprocal gait with good return. Patient encouraged to continue with HEP at current rate to continue to improve functional mobility. Patient still showing mild limitation in balance and gait/ decreased stride length/ heel strike.    Personal Factors and Comorbidities  Age;Comorbidity 3+    Comorbidities  Hx Cancer, osteopenia, asthma    Examination-Activity Limitations  Stand;Locomotion Level;Bed Mobility;Bend;Transfers;Squat;Stairs    Examination-Participation Restrictions  Yard Work;Cleaning;Meal Prep;Community Activity;Driving;Shop;Laundry    Stability/Clinical Decision Making  Stable/Uncomplicated    Rehab Potential  Good    PT Frequency  3x / week    PT Duration  2 weeks    PT Treatment/Interventions  ADLs/Self Care Home Management;Cryotherapy;Electrical Stimulation;DME Instruction;Gait training;Stair training;Functional mobility training;Therapeutic activities;Therapeutic exercise;Balance training;Neuromuscular re-education;Patient/family education;Orthotic Fit/Training;Manual techniques;Scar mobilization;Compression bandaging;Passive range of motion;Dry needling;Energy  conservation;Taping    PT Next Visit Plan  Progress balance and gait training as tolerated. Add hurdle step over next visit.    PT Home Exercise Plan  07/08/19: heel slides, quad sets, glute sets x10 2-3 x daily    Consulted and Agree with Plan of Care  Patient       Patient will benefit from skilled therapeutic intervention in order to improve the following deficits and impairments:  Abnormal gait, Increased fascial restricitons, Improper body mechanics, Pain, Decreased mobility, Decreased scar mobility, Decreased activity tolerance, Decreased endurance, Decreased range of motion, Decreased strength, Hypomobility, Decreased balance, Difficulty walking, Increased edema  Visit Diagnosis: Stiffness of left knee, not elsewhere classified  Muscle weakness (generalized)  Other abnormalities of gait and mobility  Acute pain of left knee     Problem List Patient Active Problem List   Diagnosis Date Noted  . Primary osteoarthritis of left knee 06/13/2019  . OA (osteoarthritis) of knee 05/18/2019  . Asthma 05/18/2019  . Hyperlipemia 05/18/2019  . Osteopenia 02/02/2019  . Muscle cramps 01/10/2019  . Peripheral neuropathy 10/11/2018  . Migraines 02/01/2018  . Insomnia 02/01/2018  . Genetic testing 02/01/2018  . Family history of cancer   . Port-A-Cath in place 10/12/2017  . Malignant neoplasm of upper-outer quadrant of left breast in female, estrogen receptor negative ([place])   . HTN, goal below 140/90 07/01/2017  . Anxiety 07/01/2017  . Lipoma of left shoulder 06/19/2017  . Left breast mass 06/19/2017  3:43 PM, 08/08/19 Josue Hector PT DPT  Physical Therapist with [facility]  [phone] [place] [address] [place], [place], [address] Phone: [phone]   Fax:  [phone]  Name: LENYA STERNE MRN: [id number] Date of Birth: 01-15-1951

## 2019-08-10 ENCOUNTER — Other Ambulatory Visit: Payer: Self-pay

## 2019-08-10 ENCOUNTER — Encounter (HOSPITAL_COMMUNITY): Payer: Self-pay | Admitting: Physical Therapy

## 2019-08-10 ENCOUNTER — Ambulatory Visit (HOSPITAL_COMMUNITY): Payer: Medicare Other | Admitting: Physical Therapy

## 2019-08-10 ENCOUNTER — Encounter: Payer: Self-pay | Admitting: Internal Medicine

## 2019-08-10 ENCOUNTER — Other Ambulatory Visit: Payer: Self-pay | Admitting: Family Medicine

## 2019-08-10 DIAGNOSIS — M25562 Pain in left knee: Secondary | ICD-10-CM

## 2019-08-10 DIAGNOSIS — R2689 Other abnormalities of gait and mobility: Secondary | ICD-10-CM | POA: Diagnosis not present

## 2019-08-10 DIAGNOSIS — M6281 Muscle weakness (generalized): Secondary | ICD-10-CM

## 2019-08-10 DIAGNOSIS — M25662 Stiffness of left knee, not elsewhere classified: Secondary | ICD-10-CM | POA: Diagnosis not present

## 2019-08-10 DIAGNOSIS — J4520 Mild intermittent asthma, uncomplicated: Secondary | ICD-10-CM

## 2019-08-10 NOTE — Therapy (Signed)
[place], [place], [address] Phone: [phone]   Fax:  [phone]  Physical Therapy Treatment  Patient Details  Name: Mary Wise MRN: [id number] Date of Birth: July 17, 1951 Referring Provider (PT): Dr. Fredonia Highland   Encounter Date: 08/10/2019  PT End of Session - 08/10/19 1445    Visit Number  12    Number of Visits  18    Date for PT Re-Evaluation  08/19/19   PN complete 12/30   Authorization Type  Primary: Medicare; Secondary: Medicaid    Authorization Time Period  07/08/19 - 08/19/19    Authorization - Visit Number  2    Authorization - Number of Visits  10    PT Start Time  1435    PT Stop Time  1520    PT Time Calculation (min)  45 min    Equipment Utilized During Treatment  Gait belt    Activity Tolerance  Patient tolerated treatment well    Behavior During Therapy  [facility] for tasks assessed/performed       Past Medical History:  Diagnosis Date  . Anxiety   . Asthma   . Cancer [facility])    left breast cancer  . Family history of cancer   . Genetic testing 02/01/2018   Multi-Cancer panel (83 genes) @ [facility] - No pathogenic mutations detected  . Hemorrhoids   . History of radiation therapy 05/27/18- 06/23/18   Left Breast 40.05 Gy in 15 fractions, left breast boost 10 Gy in 5 fractions.   . Lipoma of left shoulder   . Migraine   . Migraine   . Osteopenia   . 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 ABDOMINAL HYSTERECTOMY    . APPENDECTOMY    . CESAREAN SECTION    . CHOLECYSTECTOMY    . ERCP    . IR FLUORO GUIDE PORT INSERTION RIGHT  10/05/2017  . IR REMOVAL TUN ACCESS W/ PORT W/O FL MOD SED  05/13/2018  . IR US GUIDE VASC ACCESS RIGHT  10/05/2017  . LIPOMA EXCISION Left 03/16/2018   left shoulder  . LIVER SURGERY    . PARTIAL MASTECTOMY WITH AXILLARY SENTINEL LYMPH NODE BIOPSY Left 07/31/2017   Procedure: LEFT PARTIAL MASTECTOMY  WITH AXILLARY SENTINEL LYMPH NODE BIOPSY;  Surgeon: Virl Cagey, MD;  Location: [facility];  Service: General;  Laterality: Left;  . TOTAL KNEE ARTHROPLASTY Left 07/05/2019   Procedure: TOTAL KNEE ARTHROPLASTY;  Surgeon: Renette Butters, MD;  Location[facility];  Service: Orthopedics;  Laterality: Left;    There were no vitals filed for this visit.  Subjective Assessment - 08/10/19 1441    Subjective  Patient stated she is doing well. Reported some discomfort due to the cold weather, but no knee pain.    Pertinent History  LT TKA 07/05/19    How long can you stand comfortably?  6-7 min    How long can you walk comfortably?  10 min with cane    Patient Stated Goals  Move better    Currently in Pain?  No/denies    Pain Onset  1 to 4 weeks ago                       [facility] Adult PT Treatment/Exercise - 08/10/19 0001      Knee/Hip Exercises: Stretches   Knee: Self-Stretch to  increase Flexion  Left;5 reps;10 seconds    Knee: Self-Stretch Limitations  on 12'' step    Gastroc Stretch  Both;3 reps;30 seconds    Gastroc Stretch Limitations  Slant board      Knee/Hip Exercises: Aerobic   Recumbent Bike  4 min warm up for ROM (unbilled)      Knee/Hip Exercises: Standing   Terminal Knee Extension  Left;15 reps    Theraband Level (Terminal Knee Extension)  Other (comment)    Terminal Knee Extension Limitations  purple    Functional Squat  1 set;15 reps    Other Standing Knee Exercises  tandem stance on foam; 2 x 30"    Other Standing Knee Exercises  Band sidestepping, 3RT, red      Knee/Hip Exercises: Supine   Knee Extension  AROM    Knee Extension Limitations  4    Knee Flexion  AROM    Knee Flexion Limitations  104      Manual Therapy   Manual Therapy  Edema management;Myofascial release    Manual therapy comments  Manual complete separate than rest of tx    Edema Management  Retrograde massage to LT LE with LE elevated    Myofascial Release  scar tissue message                PT Short Term Goals - 08/03/19 1549      PT SHORT TERM GOAL #1   Title  Patient will demonstrate understanding and report regular compliance with HEP to improve knee AROM, lower extremity strength, and overall functional mobility.    Time  3    Period  Weeks    Status  Achieved    Target Date  07/29/19      PT SHORT TERM GOAL #2   Title  Patient will demonstrate left knee extension/flexion active range of motion of at least 5-80 degrees to assist with more normalized gait pattern and stair ambulation.    Baseline  12/30: 8-90 degrees    Time  3    Period  Weeks    Status  Partially Met    Target Date  07/29/19        PT Long Term Goals - 08/03/19 1549      PT LONG TERM GOAL #1   Title  Patient will perform single limb stance on left lower extremity for 3 seconds in order to assist with stair ambulation.    Time  6    Period  Weeks    Status  Achieved      PT LONG TERM GOAL #2   Title  Patient will demonstrate improvement of 1 MMT grade in all knee musculature tested as deficient at evaluation to assist with improvement in gait mechanics and stair ambulation.    Time  6    Period  Weeks    Status  Achieved      PT LONG TERM GOAL #3   Title  Patient will improve ROM for left knee extension/flexion to 0-105 degrees to improve squatting, and other functional mobility.    Baseline  12/30: 8-90 deg    Time  6    Period  Weeks    Status  On-going      PT LONG TERM GOAL #4   Title  Patient will demonstrate ability to ambulate on 2MWT with LRAD an improvement of 100 feet indicating improved ability to ambulate safely household and limited community distances.    Time  6  Period  Weeks    Status  Achieved      PT LONG TERM GOAL #5   Title  Patient will report that she has been able to ambulate for at least 20 minutes without more than 2/10 pain in order to perform grocery shopping with greater ease.    Time  6    Period  Weeks    Status  On-going             Plan - 08/10/19 1534    Clinical Impression Statement  Continued with established POC this session. Added functional squats this session with cueing for proper form to shift weight posteriorly. Patient demonstrated improved form following verbal cues. Patient's AROM improved this session 4-104 degrees. Patient would benefit from continued skilled physical therapy to continue progressing towards functional goals.    Personal Factors and Comorbidities  Age;Comorbidity 3+    Comorbidities  Hx Cancer, osteopenia, asthma    Examination-Activity Limitations  Stand;Locomotion Level;Bed Mobility;Bend;Transfers;Squat;Stairs    Examination-Participation Restrictions  Yard Work;Cleaning;Meal Prep;Community Activity;Driving;Shop;Laundry    Stability/Clinical Decision Making  Stable/Uncomplicated    Rehab Potential  Good    PT Frequency  3x / week    PT Duration  2 weeks    PT Treatment/Interventions  ADLs/Self Care Home Management;Cryotherapy;Electrical Stimulation;DME Instruction;Gait training;Stair training;Functional mobility training;Therapeutic activities;Therapeutic exercise;Balance training;Neuromuscular re-education;Patient/family education;Orthotic Fit/Training;Manual techniques;Scar mobilization;Compression bandaging;Passive range of motion;Dry needling;Energy conservation;Taping    PT Next Visit Plan  Progress balance and gait training as tolerated. Add hurdle step over next visit. Update HEP.    PT Home Exercise Plan  07/08/19: heel slides, quad sets, glute sets x10 2-3 x daily    Consulted and Agree with Plan of Care  Patient       Patient will benefit from skilled therapeutic intervention in order to improve the following deficits and impairments:  Abnormal gait, Increased fascial restricitons, Improper body mechanics, Pain, Decreased mobility, Decreased scar mobility, Decreased activity tolerance, Decreased endurance, Decreased range of motion, Decreased strength, Hypomobility,  Decreased balance, Difficulty walking, Increased edema  Visit Diagnosis: Acute pain of left knee  Stiffness of left knee, not elsewhere classified  Muscle weakness (generalized)  Other abnormalities of gait and mobility     Problem List Patient Active Problem List   Diagnosis Date Noted  . Primary osteoarthritis of left knee 06/13/2019  . OA (osteoarthritis) of knee 05/18/2019  . Asthma 05/18/2019  . Hyperlipemia 05/18/2019  . Osteopenia 02/02/2019  . Muscle cramps 01/10/2019  . Peripheral neuropathy 10/11/2018  . Migraines 02/01/2018  . Insomnia 02/01/2018  . Genetic testing 02/01/2018  . Family history of cancer   . Port-A-Cath in place 10/12/2017  . Malignant neoplasm of upper-outer quadrant of left breast in female, estrogen receptor negative ([place])   . HTN, goal below 140/90 07/01/2017  . Anxiety 07/01/2017  . Lipoma of left shoulder 06/19/2017  . Left breast mass 06/19/2017   Clarene Critchley PT, DPT 3:38 PM, 08/10/19 [place], [place], [address] Phone: [phone]   Fax:  [phone]  Name: Mary Wise MRN: [id number] Date of Birth: February 28, 1951

## 2019-08-11 ENCOUNTER — Other Ambulatory Visit: Payer: Self-pay | Admitting: *Deleted

## 2019-08-11 MED ORDER — OMEPRAZOLE 20 MG PO CPDR: 20.0000 mg | DELAYED_RELEASE_CAPSULE | Freq: Every day | ORAL | 1 refills | Status: DC

## 2019-08-12 ENCOUNTER — Ambulatory Visit (HOSPITAL_COMMUNITY): Payer: Medicare Other | Admitting: Physical Therapy

## 2019-08-12 ENCOUNTER — Encounter (HOSPITAL_COMMUNITY): Payer: Self-pay | Admitting: Physical Therapy

## 2019-08-12 ENCOUNTER — Other Ambulatory Visit: Payer: Self-pay

## 2019-08-12 DIAGNOSIS — M25562 Pain in left knee: Secondary | ICD-10-CM | POA: Diagnosis not present

## 2019-08-12 DIAGNOSIS — M6281 Muscle weakness (generalized): Secondary | ICD-10-CM | POA: Diagnosis not present

## 2019-08-12 DIAGNOSIS — M25662 Stiffness of left knee, not elsewhere classified: Secondary | ICD-10-CM

## 2019-08-12 DIAGNOSIS — R2689 Other abnormalities of gait and mobility: Secondary | ICD-10-CM | POA: Diagnosis not present

## 2019-08-12 NOTE — Therapy (Signed)
[place], [place], [address] Phone: [phone]   Fax:  [phone]  Physical Therapy Treatment  Patient Details  Name: Mary Wise MRN: [id number] Date of Birth: 10/10/1950 Referring Provider (PT): Dr. Fredonia Highland   Encounter Date: 08/12/2019  PT End of Session - 08/12/19 1340    Visit Number  13    Number of Visits  18    Date for PT Re-Evaluation  08/19/19   PN complete 12/30   Authorization Type  Primary: Medicare; Secondary: Medicaid    Authorization Time Period  07/08/19 - 08/19/19    Authorization - Visit Number  3    Authorization - Number of Visits  10    PT Start Time  1331    PT Stop Time  1410    PT Time Calculation (min)  39 min    Equipment Utilized During Treatment  Gait belt    Activity Tolerance  Patient tolerated treatment well    Behavior During Therapy  WFL for tasks assessed/performed       Past Medical History:  Diagnosis Date  . Anxiety   . Asthma   . Cancer [facility])    left breast cancer  . Family history of cancer   . Genetic testing 02/01/2018   Multi-Cancer panel (83 genes) @ [facility] - No pathogenic mutations detected  . Hemorrhoids   . History of radiation therapy 05/27/18- 06/23/18   Left Breast 40.05 Gy in 15 fractions, left breast boost 10 Gy in 5 fractions.   . Lipoma of left shoulder   . Migraine   . Migraine   . Osteopenia   . 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 ABDOMINAL HYSTERECTOMY    . APPENDECTOMY    . CESAREAN SECTION    . CHOLECYSTECTOMY    . ERCP    . IR FLUORO GUIDE PORT INSERTION RIGHT  10/05/2017  . IR REMOVAL TUN ACCESS W/ PORT W/O FL MOD SED  05/13/2018  . IR US GUIDE VASC ACCESS RIGHT  10/05/2017  . LIPOMA EXCISION Left 03/16/2018   left shoulder  . LIVER SURGERY    . PARTIAL MASTECTOMY WITH AXILLARY SENTINEL LYMPH NODE BIOPSY Left 07/31/2017   Procedure: LEFT PARTIAL MASTECTOMY  WITH AXILLARY SENTINEL LYMPH NODE BIOPSY;  Surgeon: Virl Cagey, MD;  Location: [facility];  Service: General;  Laterality: Left;  . TOTAL KNEE ARTHROPLASTY Left 07/05/2019   Procedure: TOTAL KNEE ARTHROPLASTY;  Surgeon: Renette Butters, MD;  Location: [facility];  Service: Orthopedics;  Laterality: Left;    There were no vitals filed for this visit.  Subjective Assessment - 08/12/19 1339    Subjective  Patient reported she is good today. She denied pain currently.    Pertinent History  LT TKA 07/05/19    How long can you stand comfortably?  6-7 min    How long can you walk comfortably?  10 min with cane    Patient Stated Goals  Move better    Currently in Pain?  No/denies                       [facility] Adult PT Treatment/Exercise - 08/12/19 0001      Knee/Hip Exercises: Stretches   Passive Hamstring Stretch  Left;3 reps;30 seconds    Passive Hamstring Stretch Limitations  Supine with strap    Hip  Flexor Stretch  Left;3 reps;30 seconds    Hip Flexor Stretch Limitations  with strap    Gastroc Stretch  Both;3 reps;30 seconds    Human resources officer board      Knee/Hip Exercises: Machines for Strengthening   Total Gym Leg Press  2x10 30# cues for eccentric control      Knee/Hip Exercises: Standing   Other Standing Knee Exercises  Band sidestepping, 3RT, red      Knee/Hip Exercises: Supine   Knee Extension  AROM    Knee Extension Limitations  4    Knee Flexion  AAROM    Knee Flexion Limitations  108      Knee/Hip Exercises: Sidelying   Hip ABduction  Strengthening;Left;20 reps;Right      Manual Therapy   Manual Therapy  Edema management;Myofascial release;Joint mobilization    Manual therapy comments  Manual complete separate than rest of tx    Edema Management  Retrograde massage to LT LE with LE elevated    Joint Mobilization  Patellar glides all directions    Myofascial Release  scar tissue massage               PT Short Term Goals -  08/03/19 1549      PT SHORT TERM GOAL #1   Title  Patient will demonstrate understanding and report regular compliance with HEP to improve knee AROM, lower extremity strength, and overall functional mobility.    Time  3    Period  Weeks    Status  Achieved    Target Date  07/29/19      PT SHORT TERM GOAL #2   Title  Patient will demonstrate left knee extension/flexion active range of motion of at least 5-80 degrees to assist with more normalized gait pattern and stair ambulation.    Baseline  12/30: 8-90 degrees    Time  3    Period  Weeks    Status  Partially Met    Target Date  07/29/19        PT Long Term Goals - 08/03/19 1549      PT LONG TERM GOAL #1   Title  Patient will perform single limb stance on left lower extremity for 3 seconds in order to assist with stair ambulation.    Time  6    Period  Weeks    Status  Achieved      PT LONG TERM GOAL #2   Title  Patient will demonstrate improvement of 1 MMT grade in all knee musculature tested as deficient at evaluation to assist with improvement in gait mechanics and stair ambulation.    Time  6    Period  Weeks    Status  Achieved      PT LONG TERM GOAL #3   Title  Patient will improve ROM for left knee extension/flexion to 0-105 degrees to improve squatting, and other functional mobility.    Baseline  12/30: 8-90 deg    Time  6    Period  Weeks    Status  On-going      PT LONG TERM GOAL #4   Title  Patient will demonstrate ability to ambulate on 2MWT with LRAD an improvement of 100 feet indicating improved ability to ambulate safely household and limited community distances.    Time  6    Period  Weeks    Status  Achieved      PT LONG TERM GOAL #5   Title  Patient will report that she has been able to ambulate for at least 20 minutes without more than 2/10 pain in order to perform grocery shopping with greater ease.    Time  6    Period  Weeks    Status  On-going            Plan - 08/12/19 1420     Clinical Impression Statement  Focused on progressing strengthening some this session. Added leg press machine with cues to not lock-out knees and for eccentric control which patient showed good carry-over with. Also added sidelying hip abduction. With manual this session added patellar glides as well and continued to focus on scar mobility. Patient reported that her scar felt better following manual therapy. She demonstrated AAROM knee flexion of 108 degrees. Plan to progress further with strengthening exercises.    Personal Factors and Comorbidities  Age;Comorbidity 3+    Comorbidities  Hx Cancer, osteopenia, asthma    Examination-Activity Limitations  Stand;Locomotion Level;Bed Mobility;Bend;Transfers;Squat;Stairs    Examination-Participation Restrictions  Yard Work;Cleaning;Meal Prep;Community Activity;Driving;Shop;Laundry    Stability/Clinical Decision Making  Stable/Uncomplicated    Rehab Potential  Good    PT Frequency  3x / week    PT Duration  2 weeks    PT Treatment/Interventions  ADLs/Self Care Home Management;Cryotherapy;Electrical Stimulation;DME Instruction;Gait training;Stair training;Functional mobility training;Therapeutic activities;Therapeutic exercise;Balance training;Neuromuscular re-education;Patient/family education;Orthotic Fit/Training;Manual techniques;Scar mobilization;Compression bandaging;Passive range of motion;Dry needling;Energy conservation;Taping    PT Next Visit Plan  Progress balance and gait training as tolerated. Add hurdle step over next visit. Update HEP.    PT Home Exercise Plan  07/08/19: heel slides, quad sets, glute sets x10 2-3 x daily    Consulted and Agree with Plan of Care  Patient       Patient will benefit from skilled therapeutic intervention in order to improve the following deficits and impairments:  Abnormal gait, Increased fascial restricitons, Improper body mechanics, Pain, Decreased mobility, Decreased scar mobility, Decreased activity  tolerance, Decreased endurance, Decreased range of motion, Decreased strength, Hypomobility, Decreased balance, Difficulty walking, Increased edema  Visit Diagnosis: Acute pain of left knee  Stiffness of left knee, not elsewhere classified  Muscle weakness (generalized)  Other abnormalities of gait and mobility     Problem List Patient Active Problem List   Diagnosis Date Noted  . Primary osteoarthritis of left knee 06/13/2019  . OA (osteoarthritis) of knee 05/18/2019  . Asthma 05/18/2019  . Hyperlipemia 05/18/2019  . Osteopenia 02/02/2019  . Muscle cramps 01/10/2019  . Peripheral neuropathy 10/11/2018  . Migraines 02/01/2018  . Insomnia 02/01/2018  . Genetic testing 02/01/2018  . Family history of cancer   . Port-A-Cath in place 10/12/2017  . Malignant neoplasm of upper-outer quadrant of left breast in female, estrogen receptor negative ([place])   . HTN, goal below 140/90 07/01/2017  . Anxiety 07/01/2017  . Lipoma of left shoulder 06/19/2017  . Left breast mass 06/19/2017   Clarene Critchley PT, DPT 2:22 PM, 08/12/19 [place], [place], [address] Phone: [phone]   Fax:  [phone]  Name: JUDYE LORINO MRN: [id number] Date of Birth: [birth year]

## 2019-08-15 ENCOUNTER — Encounter (HOSPITAL_COMMUNITY): Payer: Self-pay | Admitting: Physical Therapy

## 2019-08-15 ENCOUNTER — Ambulatory Visit (HOSPITAL_COMMUNITY): Payer: Medicare Other | Admitting: Physical Therapy

## 2019-08-15 ENCOUNTER — Other Ambulatory Visit: Payer: Self-pay

## 2019-08-15 DIAGNOSIS — M25662 Stiffness of left knee, not elsewhere classified: Secondary | ICD-10-CM

## 2019-08-15 DIAGNOSIS — M25562 Pain in left knee: Secondary | ICD-10-CM | POA: Diagnosis not present

## 2019-08-15 DIAGNOSIS — R2689 Other abnormalities of gait and mobility: Secondary | ICD-10-CM | POA: Diagnosis not present

## 2019-08-15 DIAGNOSIS — M6281 Muscle weakness (generalized): Secondary | ICD-10-CM

## 2019-08-15 NOTE — Therapy (Signed)
[place], [place], [address] Phone: [phone]   Fax:  [phone]  Physical Therapy Treatment  Patient Details  Name: Mary Wise MRN: [id number] Date of Birth: 1950-08-21 Referring Provider (PT): Dr. Fredonia Highland   Encounter Date: 08/15/2019  PT End of Session - 08/15/19 1648    Visit Number  14    Number of Visits  18    Date for PT Re-Evaluation  08/19/19   PN complete 12/30   Authorization Type  Primary: Medicare; Secondary: Medicaid    Authorization Time Period  07/08/19 - 08/19/19    Authorization - Visit Number  4    Authorization - Number of Visits  10    PT Start Time  1630    PT Stop Time  1715    PT Time Calculation (min)  45 min    Equipment Utilized During Treatment  --    Activity Tolerance  Patient tolerated treatment well    Behavior During Therapy  [facility] for tasks assessed/performed       Past Medical History:  Diagnosis Date  . Anxiety   . Asthma   . Cancer [facility])    left breast cancer  . Family history of cancer   . Genetic testing 02/01/2018   Multi-Cancer panel (83 genes) @ [facility] - No pathogenic mutations detected  . Hemorrhoids   . History of radiation therapy 05/27/18- 06/23/18   Left Breast 40.05 Gy in 15 fractions, left breast boost 10 Gy in 5 fractions.   . Lipoma of left shoulder   . Migraine   . Migraine   . Osteopenia   . Seizures (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 ABDOMINAL HYSTERECTOMY    . APPENDECTOMY    . CESAREAN SECTION    . CHOLECYSTECTOMY    . ERCP    . IR FLUORO GUIDE PORT INSERTION RIGHT  10/05/2017  . IR REMOVAL TUN ACCESS W/ PORT W/O FL MOD SED  05/13/2018  . IR US GUIDE VASC ACCESS RIGHT  10/05/2017  . LIPOMA EXCISION Left 03/16/2018   left shoulder  . LIVER SURGERY    . PARTIAL MASTECTOMY WITH AXILLARY SENTINEL LYMPH NODE BIOPSY Left 07/31/2017   Procedure: LEFT PARTIAL MASTECTOMY WITH  AXILLARY SENTINEL LYMPH NODE BIOPSY;  Surgeon: Virl Cagey, MD;  Location: [facility];  Service: General;  Laterality: Left;  . TOTAL KNEE ARTHROPLASTY Left 07/05/2019   Procedure: TOTAL KNEE ARTHROPLASTY;  Surgeon: Renette Butters, MD;  Location: [facility];  Service: Orthopedics;  Laterality: Left;    There were no vitals filed for this visit.  Subjective Assessment - 08/15/19 1645    Subjective  Patient says she has been doing much better. Patient says she has been walking more without her cane and is doing well. Patient notes improved mobility and decreased knee pain. Patient reports no knee pain currently.    Pertinent History  LT TKA 07/05/19    How long can you stand comfortably?  6-7 min    How long can you walk comfortably?  10 min with cane    Patient Stated Goals  Move better    Currently in Pain?  No/denies                       [facility] Adult PT Treatment/Exercise - 08/15/19 0001      Knee/Hip Exercises:  Stretches   Knee: Self-Stretch to increase Flexion  Left;5 reps;10 seconds    Knee: Self-Stretch Limitations  --   on 2nd step   Gastroc Stretch  Both;3 reps;30 seconds    Gastroc Stretch Limitations  Slant board      Knee/Hip Exercises: Aerobic   Recumbent Bike  4 min warm up for ROM      Knee/Hip Exercises: Machines for Strengthening   Total Gym Leg Press  2x10 40# cues for eccentric control    Other Machine  Machine walkouts 40# x10      Knee/Hip Exercises: Standing   Functional Squat  1 set;10 reps    Functional Squat Limitations  to chiar for cues     Stairs  5RT, 7 in, single hand rail, reciprocal gait    SLS  2 x 10" both     Other Standing Knee Exercises  Band sidestepping, 3RT, red      Knee/Hip Exercises: Supine   Knee Extension  AROM    Knee Extension Limitations  4    Knee Flexion  AROM    Knee Flexion Limitations  105      Manual Therapy   Manual Therapy  Edema management;Myofascial release;Joint mobilization    Manual therapy  comments  Manual complete separate than rest of tx    Edema Management  Retrograde massage to LT LE with LE elevated    Joint Mobilization  Patellar glides all directions    Myofascial Release  scar tissue massage               PT Short Term Goals - 08/03/19 1549      PT SHORT TERM GOAL #1   Title  Patient will demonstrate understanding and report regular compliance with HEP to improve knee AROM, lower extremity strength, and overall functional mobility.    Time  3    Period  Weeks    Status  Achieved    Target Date  07/29/19      PT SHORT TERM GOAL #2   Title  Patient will demonstrate left knee extension/flexion active range of motion of at least 5-80 degrees to assist with more normalized gait pattern and stair ambulation.    Baseline  12/30: 8-90 degrees    Time  3    Period  Weeks    Status  Partially Met    Target Date  07/29/19        PT Long Term Goals - 08/03/19 1549      PT LONG TERM GOAL #1   Title  Patient will perform single limb stance on left lower extremity for 3 seconds in order to assist with stair ambulation.    Time  6    Period  Weeks    Status  Achieved      PT LONG TERM GOAL #2   Title  Patient will demonstrate improvement of 1 MMT grade in all knee musculature tested as deficient at evaluation to assist with improvement in gait mechanics and stair ambulation.    Time  6    Period  Weeks    Status  Achieved      PT LONG TERM GOAL #3   Title  Patient will improve ROM for left knee extension/flexion to 0-105 degrees to improve squatting, and other functional mobility.    Baseline  12/30: 8-90 deg    Time  6    Period  Weeks    Status  On-going  PT LONG TERM GOAL #4   Title  Patient will demonstrate ability to ambulate on 2MWT with LRAD an improvement of 100 feet indicating improved ability to ambulate safely household and limited community distances.    Time  6    Period  Weeks    Status  Achieved      PT LONG TERM GOAL #5   Title   Patient will report that she has been able to ambulate for at least 20 minutes without more than 2/10 pain in order to perform grocery shopping with greater ease.    Time  6    Period  Weeks    Status  On-going            Plan - 08/15/19 1745    Clinical Impression Statement  Continued strength progressions and functional activity this visit. Patient tolerated session well but did note increased discomfort and sensation of LT knee swelling during single leg stands. Patient able to progress weight with leg press, but required verbal cues for slow lowering. Patient had some difficulty with static balance during SLS, but was able to achieve 7 sec hold on LLE. Patient continues to demo slow, steady progress toward AROM LTGs.    Personal Factors and Comorbidities  Age;Comorbidity 3+    Comorbidities  Hx Cancer, osteopenia, asthma    Examination-Activity Limitations  Stand;Locomotion Level;Bed Mobility;Bend;Transfers;Squat;Stairs    Examination-Participation Restrictions  Yard Work;Cleaning;Meal Prep;Community Activity;Driving;Shop;Laundry    Stability/Clinical Decision Making  Stable/Uncomplicated    Rehab Potential  Good    PT Frequency  3x / week    PT Duration  2 weeks    PT Treatment/Interventions  ADLs/Self Care Home Management;Cryotherapy;Electrical Stimulation;DME Instruction;Gait training;Stair training;Functional mobility training;Therapeutic activities;Therapeutic exercise;Balance training;Neuromuscular re-education;Patient/family education;Orthotic Fit/Training;Manual techniques;Scar mobilization;Compression bandaging;Passive range of motion;Dry needling;Energy conservation;Taping    PT Next Visit Plan  Continue to progress strength, ROM and dynamic stability. Prepare for DC to transition to home program at end of week.    PT Home Exercise Plan  07/08/19: heel slides, quad sets, glute sets x10 2-3 x daily    Consulted and Agree with Plan of Care  Patient       Patient will benefit  from skilled therapeutic intervention in order to improve the following deficits and impairments:  Abnormal gait, Increased fascial restricitons, Improper body mechanics, Pain, Decreased mobility, Decreased scar mobility, Decreased activity tolerance, Decreased endurance, Decreased range of motion, Decreased strength, Hypomobility, Decreased balance, Difficulty walking, Increased edema  Visit Diagnosis: Acute pain of left knee  Stiffness of left knee, not elsewhere classified  Muscle weakness (generalized)  Other abnormalities of gait and mobility     Problem List Patient Active Problem List   Diagnosis Date Noted  . Primary osteoarthritis of left knee 06/13/2019  . OA (osteoarthritis) of knee 05/18/2019  . Asthma 05/18/2019  . Hyperlipemia 05/18/2019  . Osteopenia 02/02/2019  . Muscle cramps 01/10/2019  . Peripheral neuropathy 10/11/2018  . Migraines 02/01/2018  . Insomnia 02/01/2018  . Genetic testing 02/01/2018  . Family history of cancer   . Port-A-Cath in place 10/12/2017  . Malignant neoplasm of upper-outer quadrant of left breast in female, estrogen receptor negative ([place])   . HTN, goal below 140/90 07/01/2017  . Anxiety 07/01/2017  . Lipoma of left shoulder 06/19/2017  . Left breast mass 06/19/2017   5:52 PM, 08/15/19 Josue Hector PT DPT  Physical Therapist with [facility]  [phone]    [facility]  [place], [place], [address] Phone: [phone]   Fax:  [phone]  Name: ARIYAN SINNETT MRN: [id number] Date of Birth: 1950-10-26

## 2019-08-16 ENCOUNTER — Ambulatory Visit (HOSPITAL_COMMUNITY): Payer: Medicare Other

## 2019-08-16 ENCOUNTER — Encounter (HOSPITAL_COMMUNITY): Payer: Self-pay

## 2019-08-16 DIAGNOSIS — R2689 Other abnormalities of gait and mobility: Secondary | ICD-10-CM

## 2019-08-16 DIAGNOSIS — M25662 Stiffness of left knee, not elsewhere classified: Secondary | ICD-10-CM

## 2019-08-16 DIAGNOSIS — M6281 Muscle weakness (generalized): Secondary | ICD-10-CM

## 2019-08-16 DIAGNOSIS — M25562 Pain in left knee: Secondary | ICD-10-CM | POA: Diagnosis not present

## 2019-08-16 NOTE — Therapy (Signed)
[facility], [place], [address] Phone: [phone]   Fax:  [phone]  Physical Therapy Treatment  Patient Details  Name: Mary Wise MRN: [id number] Date of Birth: Sep 30, 1950 Referring Provider (PT): Dr. Fredonia Highland   Encounter Date: 08/16/2019  PT End of Session - 08/16/19 1352    Visit Number  15    Number of Visits  18    Date for PT Re-Evaluation  08/19/19   PN complete 12/30   Authorization Type  Primary: Medicare; Secondary: Medicaid    Authorization Time Period  07/08/19 - 08/19/19    Authorization - Visit Number  5    Authorization - Number of Visits  10    PT Start Time  5573    PT Stop Time  3780   pt had to leave early due to transportation   PT Time Calculation (min)  29 min    Activity Tolerance  Patient tolerated treatment well;No increased pain    Behavior During Therapy  WFL for tasks assessed/performed       Past Medical History:  Diagnosis Date  . Anxiety   . Asthma   . Cancer [facility])    left breast cancer  . Family history of cancer   . Genetic testing 02/01/2018   Multi-Cancer panel (83 genes) @ [facility] - No pathogenic mutations detected  . Hemorrhoids   . History of radiation therapy 05/27/18- 06/23/18   Left Breast 40.05 Gy in 15 fractions, left breast boost 10 Gy in 5 fractions.   . Lipoma of left shoulder   . Migraine   . Migraine   . Osteopenia   . 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 ABDOMINAL HYSTERECTOMY    . APPENDECTOMY    . CESAREAN SECTION    . CHOLECYSTECTOMY    . ERCP    . IR FLUORO GUIDE PORT INSERTION RIGHT  10/05/2017  . IR REMOVAL TUN ACCESS W/ PORT W/O FL MOD SED  05/13/2018  . IR US GUIDE VASC ACCESS RIGHT  10/05/2017  . LIPOMA EXCISION Left 03/16/2018   left shoulder  . LIVER SURGERY    . PARTIAL MASTECTOMY WITH AXILLARY SENTINEL LYMPH NODE BIOPSY Left 07/31/2017   Procedure: LEFT  PARTIAL MASTECTOMY WITH AXILLARY SENTINEL LYMPH NODE BIOPSY;  Surgeon: Virl Cagey, MD;  Location: [facility];  Service: General;  Laterality: Left;  . TOTAL KNEE ARTHROPLASTY Left 07/05/2019   Procedure: TOTAL KNEE ARTHROPLASTY;  Surgeon: Renette Butters, MD;  Location: [facility];  Service: Orthopedics;  Laterality: Left;    There were no vitals filed for this visit.  Subjective Assessment - 08/16/19 1350    Subjective  Pt reports worked hard yesterday in therapy and then worked more at home. Pt reports she is only concerned about swelling hanging around regarding d/c from therapy this week.    Pertinent History  LT TKA 07/05/19    Limitations  Standing;Walking;House hold activities    How long can you stand comfortably?  6-7 min    How long can you walk comfortably?  10 min with cane    Patient Stated Goals  Move better    Currently in Pain?  Yes    Pain Score  3     Pain Location  Knee    Pain Orientation  Left    Pain Descriptors / Indicators  Aching;Sore  Pain Type  Surgical pain    Pain Onset  1 to 4 weeks ago    Pain Frequency  Constant    Aggravating Factors   stanading, walking    Pain Relieving Factors  rest, meds    Effect of Pain on Daily Activities  limits           [facility] Adult PT Treatment/Exercise - 08/16/19 0001      Ambulation/Gait   Gait Comments  Adjusted pt's SPC to higher setting to reduce trunk forwardflexion and improve weight-shifting to LLE with good improvement and denies pain      Knee/Hip Exercises: Stretches   Passive Hamstring Stretch  Left;3 reps;30 seconds    Passive Hamstring Stretch Limitations  12" step    Knee: Self-Stretch to increase Flexion  Left    Knee: Self-Stretch Limitations  12" step, 10x10 sec hold      Knee/Hip Exercises: Aerobic   Recumbent Bike  4 min warm up for ROM, full revolutions      Knee/Hip Exercises: Standing   Terminal Knee Extension  Left;10 reps;2 sets    Terminal Knee Extension Limitations  Purple, one set  static, one set added step back    SLS  4 attempts each: 16 sec best R, 10 sec best L             PT Education - 08/16/19 1351    Education Details  Exercise technique, continue HEP, SPC adjustment, reassess/discharge this week    Person(s) Educated  Patient    Methods  Explanation    Comprehension  Verbalized understanding       PT Short Term Goals - 08/03/19 1549      PT SHORT TERM GOAL #1   Title  Patient will demonstrate understanding and report regular compliance with HEP to improve knee AROM, lower extremity strength, and overall functional mobility.    Time  3    Period  Weeks    Status  Achieved    Target Date  07/29/19      PT SHORT TERM GOAL #2   Title  Patient will demonstrate left knee extension/flexion active range of motion of at least 5-80 degrees to assist with more normalized gait pattern and stair ambulation.    Baseline  12/30: 8-90 degrees    Time  3    Period  Weeks    Status  Partially Met    Target Date  07/29/19        PT Long Term Goals - 08/03/19 1549      PT LONG TERM GOAL #1   Title  Patient will perform single limb stance on left lower extremity for 3 seconds in order to assist with stair ambulation.    Time  6    Period  Weeks    Status  Achieved      PT LONG TERM GOAL #2   Title  Patient will demonstrate improvement of 1 MMT grade in all knee musculature tested as deficient at evaluation to assist with improvement in gait mechanics and stair ambulation.    Time  6    Period  Weeks    Status  Achieved      PT LONG TERM GOAL #3   Title  Patient will improve ROM for left knee extension/flexion to 0-105 degrees to improve squatting, and other functional mobility.    Baseline  12/30: 8-90 deg    Time  6    Period  Weeks  Status  On-going      PT LONG TERM GOAL #4   Title  Patient will demonstrate ability to ambulate on 2MWT with LRAD an improvement of 100 feet indicating improved ability to ambulate safely household and limited  community distances.    Time  6    Period  Weeks    Status  Achieved      PT LONG TERM GOAL #5   Title  Patient will report that she has been able to ambulate for at least 20 minutes without more than 2/10 pain in order to perform grocery shopping with greater ease.    Time  6    Period  Weeks    Status  On-going            Plan - 08/16/19 1418    Clinical Impression Statement  Pt initially stiff with antalgic gait. Pt able to complete full revolutions on bike with initial hip hiking, improving to maintain level hips to encourage increased knee flexion. Pt denies pain with stretching on 12" step. Adjusted pt's SPC with gait training due to forward flexed trunk and poor L weight-shifting, good improvements with elevating SPC and pt reports feeling better. Plan to review goals, finalize HEP and d/c pt next session.    Personal Factors and Comorbidities  Age;Comorbidity 3+    Comorbidities  Hx Cancer, osteopenia, asthma    Examination-Activity Limitations  Stand;Locomotion Level;Bed Mobility;Bend;Transfers;Squat;Stairs    Examination-Participation Restrictions  Yard Work;Cleaning;Meal Prep;Community Activity;Driving;Shop;Laundry    Stability/Clinical Decision Making  Stable/Uncomplicated    Rehab Potential  Good    PT Frequency  3x / week    PT Duration  2 weeks    PT Treatment/Interventions  ADLs/Self Care Home Management;Cryotherapy;Electrical Stimulation;DME Instruction;Gait training;Stair training;Functional mobility training;Therapeutic activities;Therapeutic exercise;Balance training;Neuromuscular re-education;Patient/family education;Orthotic Fit/Training;Manual techniques;Scar mobilization;Compression bandaging;Passive range of motion;Dry needling;Energy conservation;Taping    PT Next Visit Plan  Review goals, d/c to HEP    PT Home Exercise Plan  07/08/19: heel slides, quad sets, glute sets x10 2-3 x daily    Consulted and Agree with Plan of Care  Patient       Patient will  benefit from skilled therapeutic intervention in order to improve the following deficits and impairments:  Abnormal gait, Increased fascial restricitons, Improper body mechanics, Pain, Decreased mobility, Decreased scar mobility, Decreased activity tolerance, Decreased endurance, Decreased range of motion, Decreased strength, Hypomobility, Decreased balance, Difficulty walking, Increased edema  Visit Diagnosis: Acute pain of left knee  Muscle weakness (generalized)  Stiffness of left knee, not elsewhere classified  Other abnormalities of gait and mobility     Problem List Patient Active Problem List   Diagnosis Date Noted  . Primary osteoarthritis of left knee 06/13/2019  . OA (osteoarthritis) of knee 05/18/2019  . Asthma 05/18/2019  . Hyperlipemia 05/18/2019  . Osteopenia 02/02/2019  . Muscle cramps 01/10/2019  . Peripheral neuropathy 10/11/2018  . Migraines 02/01/2018  . Insomnia 02/01/2018  . Genetic testing 02/01/2018  . Family history of cancer   . Port-A-Cath in place 10/12/2017  . Malignant neoplasm of upper-outer quadrant of left breast in female, estrogen receptor negative ([place])   . HTN, goal below 140/90 07/01/2017  . Anxiety 07/01/2017  . Lipoma of left shoulder 06/19/2017  . Left breast mass 06/19/2017      Talbot Grumbling PT, DPT 08/16/19, 2:21 PM [place] [address] [place], [place], [address] Phone: [phone]   Fax:  [phone]  Name:  YAZLIN EKBLAD MRN: [id number] Date of Birth: 1950-08-07

## 2019-08-17 ENCOUNTER — Encounter (HOSPITAL_COMMUNITY): Payer: Medicare Other | Admitting: Physical Therapy

## 2019-08-17 DIAGNOSIS — Z96652 Presence of left artificial knee joint: Secondary | ICD-10-CM | POA: Diagnosis not present

## 2019-08-19 ENCOUNTER — Other Ambulatory Visit: Payer: Self-pay

## 2019-08-19 ENCOUNTER — Ambulatory Visit (HOSPITAL_COMMUNITY): Payer: Medicare Other | Admitting: Physical Therapy

## 2019-08-19 DIAGNOSIS — M6281 Muscle weakness (generalized): Secondary | ICD-10-CM | POA: Diagnosis not present

## 2019-08-19 DIAGNOSIS — M25562 Pain in left knee: Secondary | ICD-10-CM

## 2019-08-19 DIAGNOSIS — M25662 Stiffness of left knee, not elsewhere classified: Secondary | ICD-10-CM | POA: Diagnosis not present

## 2019-08-19 DIAGNOSIS — R2689 Other abnormalities of gait and mobility: Secondary | ICD-10-CM | POA: Diagnosis not present

## 2019-08-19 NOTE — Patient Instructions (Signed)
SIT TO STAND: Feet Narrow    Place feet close together. Lean chest forward. Raise hips and straighten knees to stand. _10__ reps per set, _1-2__ sets per day, __7_ days per week Hold onto a support as needed  Copyright  VHI. All rights reserved.  KNEE: Knee Hang - Prone    Lie on stomach. Place towel above knee; hang feet off surface. Keep feet straight. Hold _60__ seconds. _increasing reps as tolerated_, _1-2__ sets per day, _7__ days per week  Copyright  VHI. All rights reserved.  Hamstring Curl    Stand at countertop for balance. Shift weight onto right leg, while bending left knee. Repeat, switching legs. Repeat 15___ times. Do __1-2_ times per day.  Copyright  VHI. All rights reserved.

## 2019-08-19 NOTE — Therapy (Signed)
[facility] [address] [place], [place], [address] Phone: [phone]   Fax:  [phone]  Physical Therapy Treatment / Discharge Summary   Patient Details  Name: Mary Wise MRN: [id number] Date of Birth: 04/30/1951 Referring Provider (PT): Dr. Fredonia Highland   Encounter Date: 08/19/2019   Progress Note Reporting Period 08/03/19 to 08/19/19  See note below for Objective Data and Assessment of Progress/Goals.       PHYSICAL THERAPY DISCHARGE SUMMARY  Visits from Start of Care: 16  Current functional level related to goals / functional outcomes: See below   Remaining deficits: See below   Education / Equipment: Educated on assessment findings, and updated HEP Plan: Patient agrees to discharge.  Patient goals were partially met. Patient is being discharged due to                                                     ?????     Meeting the majority of her goals and having reached a plateau in the areas where she still has deficits.   PT End of Session - 08/19/19 1518    Visit Number  16    Number of Visits  18    Date for PT Re-Evaluation  08/19/19   PN complete 12/30   Authorization Type  Primary: Medicare; Secondary: Medicaid    Authorization Time Period  07/08/19 - 08/19/19    Authorization - Visit Number  6    Authorization - Number of Visits  10    PT Start Time  1426    PT Stop Time  1500    PT Time Calculation (min)  34 min    Activity Tolerance  Patient tolerated treatment well;No increased pain    Behavior During Therapy  WFL for tasks assessed/performed       Past Medical History:  Diagnosis Date  . Anxiety   . Asthma   . Cancer [facility])    left breast cancer  . Family history of cancer   . Genetic testing 02/01/2018   Multi-Cancer panel (83 genes) @ [facility] - No pathogenic mutations detected  . Hemorrhoids   . History of radiation therapy 05/27/18- 06/23/18   Left Breast 40.05 Gy in 15 fractions, left  breast boost 10 Gy in 5 fractions.   . Lipoma of left shoulder   . Migraine   . Migraine   . Osteopenia   . 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 ABDOMINAL HYSTERECTOMY    . APPENDECTOMY    . CESAREAN SECTION    . CHOLECYSTECTOMY    . ERCP    . IR FLUORO GUIDE PORT INSERTION RIGHT  10/05/2017  . IR REMOVAL TUN ACCESS W/ PORT W/O FL MOD SED  05/13/2018  . IR US GUIDE VASC ACCESS RIGHT  10/05/2017  . LIPOMA EXCISION Left 03/16/2018   left shoulder  . LIVER SURGERY    . PARTIAL MASTECTOMY WITH AXILLARY SENTINEL LYMPH NODE BIOPSY Left 07/31/2017   Procedure: LEFT PARTIAL MASTECTOMY WITH AXILLARY SENTINEL LYMPH NODE BIOPSY;  Surgeon: Virl Cagey, MD;  Location: [facility];  Service: General;  Laterality: Left;  . TOTAL KNEE ARTHROPLASTY Left 07/05/2019   Procedure: TOTAL KNEE ARTHROPLASTY;  Surgeon: Edmonia Lynch  D, MD;  Location: [facility];  Service: Orthopedics;  Laterality: Left;    There were no vitals filed for this visit.  Subjective Assessment - 08/19/19 1528    Subjective  Patient denied any pain. Reported that she has been doing well. She said she saw the MD and that he said that she was doing well and looked like she'd been doing her exercises at home. Reported feeling comfortable to do her exercises at home at this point.    Pertinent History  LT TKA 07/05/19    Limitations  Standing;Walking;House hold activities    How long can you stand comfortably?  6-7 min    How long can you walk comfortably?  10 min with cane    Patient Stated Goals  Move better    Currently in Pain?  No/denies         [facility] PT Assessment - 08/19/19 0001      Observation/Other Assessments   Focus on Therapeutic Outcomes (FOTO)   40% limited   Was 57% limited     Circumferential Edema   Circumferential - Right  16.5   was 15.5   Circumferential - Left   16   was 17     AROM   Left Knee Extension  4    Left Knee Flexion   108      Strength   Right Hip Flexion  5/5    Right Hip Extension  5/5    Right Hip ABduction  5/5    Left Hip Flexion  4+/5   was 4   Left Hip Extension  5/5    Left Hip ABduction  5/5    Right Knee Flexion  5/5    Right Knee Extension  5/5    Left Knee Flexion  5/5    Left Knee Extension  5/5    Right Ankle Dorsiflexion  5/5    Left Ankle Dorsiflexion  5/5                   [facility] Adult PT Treatment/Exercise - 08/19/19 0001      Ambulation/Gait   Ambulation Distance (Feet)  376 Feet   2MWT   Assistive device  None               PT Short Term Goals - 08/19/19 1515      PT SHORT TERM GOAL #1   Title  Patient will demonstrate understanding and report regular compliance with HEP to improve knee AROM, lower extremity strength, and overall functional mobility.    Time  3    Period  Weeks    Status  Achieved    Target Date  07/29/19      PT SHORT TERM GOAL #2   Title  Patient will demonstrate left knee extension/flexion active range of motion of at least 5-80 degrees to assist with more normalized gait pattern and stair ambulation.    Baseline  08/19/19: 4-108    Time  3    Period  Weeks    Status  Achieved    Target Date  07/29/19        PT Long Term Goals - 08/19/19 1515      PT LONG TERM GOAL #1   Title  Patient will perform single limb stance on left lower extremity for 3 seconds in order to assist with stair ambulation.    Time  6    Period  Weeks    Status  Achieved  PT LONG TERM GOAL #2   Title  Patient will demonstrate improvement of 1 MMT grade in all knee musculature tested as deficient at evaluation to assist with improvement in gait mechanics and stair ambulation.    Time  6    Period  Weeks    Status  Achieved      PT LONG TERM GOAL #3   Title  Patient will improve ROM for left knee extension/flexion to 0-105 degrees to improve squatting, and other functional mobility.    Baseline  08/19/19: 4-108    Time  6    Period   Weeks    Status  Partially Met      PT LONG TERM GOAL #4   Title  Patient will demonstrate ability to ambulate on 2MWT with LRAD an improvement of 100 feet indicating improved ability to ambulate safely household and limited community distances.    Time  6    Period  Weeks    Status  Achieved      PT LONG TERM GOAL #5   Title  Patient will report that she has been able to ambulate for at least 20 minutes without more than 2/10 pain in order to perform grocery shopping with greater ease.    Baseline  08/19/19: Patient reported ability to ambulate for 20 minutes, reported 4/10 pain due to neuropathy at times    Time  6    Period  Weeks    Status  Partially Met            Plan - 08/19/19 1452    Clinical Impression Statement  Performed re-assessment of patient's progress towards functional goals. Patient achieved 2 out of 2 short term goals. Patient achieved 3 out of 5 long term goals, and partially met 2 out of 5 long term goals. Patient continued to have some deficits in knee AROM, however, patient's level of flexion and extension has not changed drastically in last several visits. In addition, patient did report to have some continued pain with ambulation at times, but attributed some of this to her neuropathy. Patient reports feeling ready to continue with HEP independently at this time and therefore plan to discharge from therapy at this point as patient has met the majority of her goals. Educated patient on an updated HEP and to contact clinic if needed in the future.    Personal Factors and Comorbidities  Age;Comorbidity 3+    Comorbidities  Hx Cancer, osteopenia, asthma    Examination-Activity Limitations  Stand;Locomotion Level;Bed Mobility;Bend;Transfers;Squat;Stairs    Examination-Participation Restrictions  Yard Work;Cleaning;Meal Prep;Community Activity;Driving;Shop;Laundry    Stability/Clinical Decision Making  Stable/Uncomplicated    Rehab Potential  Good    PT Frequency  3x  / week    PT Duration  2 weeks    PT Treatment/Interventions  ADLs/Self Care Home Management;Cryotherapy;Electrical Stimulation;DME Instruction;Gait training;Stair training;Functional mobility training;Therapeutic activities;Therapeutic exercise;Balance training;Neuromuscular re-education;Patient/family education;Orthotic Fit/Training;Manual techniques;Scar mobilization;Compression bandaging;Passive range of motion;Dry needling;Energy conservation;Taping    PT Next Visit Plan  D/C    PT Home Exercise Plan  07/08/19: heel slides, quad sets, glute sets x10 2-3 x daily; 08/19/19: Prone knee hang building up number of minutes tolerated, STS x10, Hamstring curl x15    Consulted and Agree with Plan of Care  Patient       Patient will benefit from skilled therapeutic intervention in order to improve the following deficits and impairments:  Abnormal gait, Increased fascial restricitons, Improper body mechanics, Pain, Decreased mobility, Decreased scar mobility, Decreased activity  tolerance, Decreased endurance, Decreased range of motion, Decreased strength, Hypomobility, Decreased balance, Difficulty walking, Increased edema  Visit Diagnosis: Acute pain of left knee  Muscle weakness (generalized)  Stiffness of left knee, not elsewhere classified  Other abnormalities of gait and mobility     Problem List Patient Active Problem List   Diagnosis Date Noted  . Primary osteoarthritis of left knee 06/13/2019  . OA (osteoarthritis) of knee 05/18/2019  . Asthma 05/18/2019  . Hyperlipemia 05/18/2019  . Osteopenia 02/02/2019  . Muscle cramps 01/10/2019  . Peripheral neuropathy 10/11/2018  . Migraines 02/01/2018  . Insomnia 02/01/2018  . Genetic testing 02/01/2018  . Family history of cancer   . Port-A-Cath in place 10/12/2017  . Malignant neoplasm of upper-outer quadrant of left breast in female, estrogen receptor negative ([place])   . HTN, goal below 140/90 07/01/2017  . Anxiety 07/01/2017  .  Lipoma of left shoulder 06/19/2017  . Left breast mass 06/19/2017   Clarene Critchley PT, DPT 3:29 PM, 08/19/19 [place], [place], [address] Phone: [phone]   Fax:  [phone]  Name: Mary Wise MRN: [id number] Date of Birth: 11/25/1950

## 2019-09-05 ENCOUNTER — Other Ambulatory Visit: Payer: Self-pay

## 2019-09-05 ENCOUNTER — Ambulatory Visit (HOSPITAL_COMMUNITY)
Admission: RE | Admit: 2019-09-05 | Discharge: 2019-09-05 | Disposition: A | Discharge status: Home / Self Care | Payer: Medicare Other | Source: Ambulatory Visit | Attending: Cardiology | Admitting: Cardiology

## 2019-09-05 ENCOUNTER — Other Ambulatory Visit (HOSPITAL_COMMUNITY): Payer: Self-pay | Admitting: Orthopedic Surgery

## 2019-09-05 ENCOUNTER — Encounter (HOSPITAL_COMMUNITY): Payer: Self-pay | Admitting: Orthopedic Surgery

## 2019-09-05 DIAGNOSIS — M79605 Pain in left leg: Secondary | ICD-10-CM

## 2019-09-05 DIAGNOSIS — M79662 Pain in left lower leg: Secondary | ICD-10-CM | POA: Diagnosis not present

## 2019-09-12 ENCOUNTER — Other Ambulatory Visit: Payer: Self-pay | Admitting: Family Medicine

## 2019-09-12 DIAGNOSIS — F419 Anxiety disorder, unspecified: Secondary | ICD-10-CM

## 2019-09-12 DIAGNOSIS — J4520 Mild intermittent asthma, uncomplicated: Secondary | ICD-10-CM

## 2019-09-12 NOTE — Telephone Encounter (Signed)
Ok to refill??  Last office visit/ refill 05/18/2019, #2 refills.

## 2019-09-14 ENCOUNTER — Ambulatory Visit: Payer: Medicare Other | Admitting: Nurse Practitioner

## 2019-09-19 ENCOUNTER — Encounter: Payer: Self-pay | Admitting: Family Medicine

## 2019-09-19 ENCOUNTER — Other Ambulatory Visit: Payer: Self-pay

## 2019-09-19 ENCOUNTER — Ambulatory Visit (INDEPENDENT_AMBULATORY_CARE_PROVIDER_SITE_OTHER): Payer: Medicare Other | Admitting: Family Medicine

## 2019-09-19 VITALS — BP 136/76 | HR 102 | Temp 98.7°F | Resp 16 | Ht 68.0 in | Wt 202.0 lb

## 2019-09-19 DIAGNOSIS — G43709 Chronic migraine without aura, not intractable, without status migrainosus: Secondary | ICD-10-CM

## 2019-09-19 DIAGNOSIS — F419 Anxiety disorder, unspecified: Secondary | ICD-10-CM

## 2019-09-19 DIAGNOSIS — E782 Mixed hyperlipidemia: Secondary | ICD-10-CM | POA: Diagnosis not present

## 2019-09-19 DIAGNOSIS — J4520 Mild intermittent asthma, uncomplicated: Secondary | ICD-10-CM | POA: Diagnosis not present

## 2019-09-19 DIAGNOSIS — I10 Essential (primary) hypertension: Secondary | ICD-10-CM | POA: Diagnosis not present

## 2019-09-19 DIAGNOSIS — M1712 Unilateral primary osteoarthritis, left knee: Secondary | ICD-10-CM

## 2019-09-19 DIAGNOSIS — G620 Drug-induced polyneuropathy: Secondary | ICD-10-CM

## 2019-09-19 MED ORDER — OMEPRAZOLE 20 MG PO CPDR: 20.0000 mg | DELAYED_RELEASE_CAPSULE | Freq: Every day | ORAL | 1 refills | Status: DC

## 2019-09-19 MED ORDER — IBUPROFEN 800 MG PO TABS: 800.0000 mg | ORAL_TABLET | Freq: Three times a day (TID) | ORAL | 1 refills | Status: DC | PRN

## 2019-09-19 MED ORDER — BACLOFEN 10 MG PO TABS: 10.0000 mg | ORAL_TABLET | Freq: Three times a day (TID) | ORAL | 1 refills | Status: DC | PRN

## 2019-09-19 NOTE — Assessment & Plan Note (Signed)
Continue gabapentin.

## 2019-09-19 NOTE — Assessment & Plan Note (Signed)
Taking statin TIW Recheck levels

## 2019-09-19 NOTE — Assessment & Plan Note (Signed)
Prn imitrex, rarely uses

## 2019-09-19 NOTE — Assessment & Plan Note (Signed)
Refilled baclofen due to spasms and her ibuprofen, takes omeprazole to coat stomach before NSAIDS

## 2019-09-19 NOTE — Assessment & Plan Note (Signed)
Controlled without meds  

## 2019-09-19 NOTE — Progress Notes (Signed)
   Subjective:    Patient ID: Mary Wise, female    DOB: Jan 19, 1951, 69 y.o.   MRN: [id number]  Patient presents for Follow-up (is not fasting)  Pt here to f/u chronic medical problems   HTN-no current medications for blood pressure.  She has been managing with her diet.  Asthma-recent exacerbations but she does have her inhalers on hand  Hyperlipidemia- taking zocor three times a week  Anxiety-she is doing well with alprazolam also uses Ambien as needed for sleep  Left knee pain and effusion- she is out of her baclofen, also out of her ibuprofen 800mg  , she has hyperactive muscles per report.  She was advised to get this from my office going forward. Also using lidoderm patch She has finished PT a few weeks ago , now doing home stretches. She feels good about her progress thus far  S/p Left TKA  Still needs to have Right one done    Review Of Systems:  GEN- denies fatigue, fever, weight loss,weakness, recent illness HEENT- denies eye drainage, change in vision, nasal discharge, CVS- denies chest pain, palpitations RESP- denies SOB, cough, wheeze ABD- denies N/V, change in stools, abd pain GU- denies dysuria, hematuria, dribbling, incontinence MSK- + joint pain, muscle aches, injury Neuro- denies headache, dizziness, syncope, seizure activity       Objective:    BP 136/76   Pulse (!) 102   Temp 98.7 F (37.1 C) (Temporal)   Resp 16   Ht 5\' 8"  (1.727 m)   Wt 202 lb (91.6 kg)   SpO2 97%   BMI 30.71 kg/m  GEN- NAD, alert and oriented x3 HEENT- PERRL, EOMI, non injected sclera, pink conjunctiva  Neck- Supple, no thyromegaly CVS- RRR, no murmur RESP-CTAB ABD-NABS,soft,NT,ND MSK- left knee incision d/c/i, fair ROM knee, +swelling  EXT- No edema Pulses- Radial, DP- 2+        Assessment & Plan:      Problem List Items Addressed This Visit      Unprioritized   Anxiety - Primary   Asthma    No exacerbations       HTN, goal below 140/90    Controlled  without meds       Relevant Orders   CBC with Differential/Platelet   Comprehensive metabolic panel   Hyperlipemia    Taking statin TIW Recheck levels      Relevant Orders   Lipid panel   Migraines    Prn imitrex, rarely uses       Relevant Medications   baclofen (LIORESAL) 10 MG tablet   ibuprofen (ADVIL) 800 MG tablet   Peripheral neuropathy    Continue gabapentin      Relevant Medications   baclofen (LIORESAL) 10 MG tablet   Primary osteoarthritis of left knee    Refilled baclofen due to spasms and her ibuprofen, takes omeprazole to coat stomach before NSAIDS      Relevant Medications   baclofen (LIORESAL) 10 MG tablet   ibuprofen (ADVIL) 800 MG tablet      Note: This dictation was prepared with Dragon dictation along with smaller phrase technology. Any transcriptional errors that result from this process are unintentional.

## 2019-09-19 NOTE — Assessment & Plan Note (Signed)
No exacerbations

## 2019-09-19 NOTE — Patient Instructions (Addendum)
F/U 4 months for Physical  

## 2019-09-20 LAB — CBC WITH DIFFERENTIAL/PLATELET
Absolute Monocytes: 475 cells/uL (ref 200–950)
Basophils Absolute: 101 cells/uL (ref 0–200)
Basophils Relative: 1.4 %
Eosinophils Absolute: 410 cells/uL (ref 15–500)
Eosinophils Relative: 5.7 %
HCT: 40.3 % (ref 35.0–45.0)
Hemoglobin: 13.4 g/dL (ref 11.7–15.5)
Lymphs Abs: 2981 cells/uL (ref 850–3900)
MCH: 29.1 pg (ref 27.0–33.0)
MCHC: 33.3 g/dL (ref 32.0–36.0)
MCV: 87.6 fL (ref 80.0–100.0)
MPV: 10.5 fL (ref 7.5–12.5)
Monocytes Relative: 6.6 %
Neutro Abs: 3233 cells/uL (ref 1500–7800)
Neutrophils Relative %: 44.9 %
Platelets: 356 10*3/uL (ref 140–400)
RBC: 4.6 10*6/uL (ref 3.80–5.10)
RDW: 12.5 % (ref 11.0–15.0)
Total Lymphocyte: 41.4 %
WBC: 7.2 10*3/uL (ref 3.8–10.8)

## 2019-09-20 LAB — COMPREHENSIVE METABOLIC PANEL
AG Ratio: 1.4 (calc) (ref 1.0–2.5)
ALT: 15 U/L (ref 6–29)
AST: 18 U/L (ref 10–35)
Albumin: 4.4 g/dL (ref 3.6–5.1)
Alkaline phosphatase (APISO): 118 U/L (ref 37–153)
BUN: 13 mg/dL (ref 7–25)
CO2: 29 mmol/L (ref 20–32)
Calcium: 9.8 mg/dL (ref 8.6–10.4)
Chloride: 101 mmol/L (ref 98–110)
Creat: 0.69 mg/dL (ref 0.50–0.99)
Globulin: 3.2 g/dL (calc) (ref 1.9–3.7)
Glucose, Bld: 91 mg/dL (ref 65–99)
Potassium: 4.1 mmol/L (ref 3.5–5.3)
Sodium: 141 mmol/L (ref 135–146)
Total Bilirubin: 0.4 mg/dL (ref 0.2–1.2)
Total Protein: 7.6 g/dL (ref 6.1–8.1)

## 2019-09-20 LAB — LIPID PANEL
Cholesterol: 213 mg/dL — ABNORMAL HIGH (ref <200)
HDL: 51 mg/dL (ref >=50)
LDL Cholesterol (Calc): 130 mg/dL (calc) — ABNORMAL HIGH
Non-HDL Cholesterol (Calc): 162 mg/dL (calc) — ABNORMAL HIGH (ref <130)
Total CHOL/HDL Ratio: 4.2 (calc) (ref <5.0)
Triglycerides: 183 mg/dL — ABNORMAL HIGH (ref <150)

## 2019-10-10 ENCOUNTER — Other Ambulatory Visit: Payer: Self-pay | Admitting: Family Medicine

## 2019-10-10 DIAGNOSIS — J4520 Mild intermittent asthma, uncomplicated: Secondary | ICD-10-CM

## 2019-10-10 DIAGNOSIS — F419 Anxiety disorder, unspecified: Secondary | ICD-10-CM

## 2019-10-10 DIAGNOSIS — C50412 Malignant neoplasm of upper-outer quadrant of left female breast: Secondary | ICD-10-CM

## 2019-10-10 NOTE — Telephone Encounter (Signed)
Ok to refill??  Last office visit 09/19/2019.  Last refill on Ambien 05/18/2019, #3 refills.   Last refill on Xanax 09/12/2019.

## 2019-10-11 ENCOUNTER — Other Ambulatory Visit: Payer: Self-pay | Admitting: Family Medicine

## 2019-10-17 ENCOUNTER — Other Ambulatory Visit: Payer: Self-pay | Admitting: Family Medicine

## 2019-10-19 DIAGNOSIS — M545 Low back pain: Secondary | ICD-10-CM | POA: Diagnosis not present

## 2019-10-25 ENCOUNTER — Encounter: Payer: Self-pay | Admitting: Neurology

## 2019-11-13 ENCOUNTER — Other Ambulatory Visit: Payer: Self-pay | Admitting: Family Medicine

## 2019-11-14 NOTE — Telephone Encounter (Signed)
Ok to refill 

## 2019-11-21 ENCOUNTER — Ambulatory Visit: Payer: Medicare Other | Admitting: Nurse Practitioner

## 2019-11-21 ENCOUNTER — Inpatient Hospital Stay (HOSPITAL_BASED_OUTPATIENT_CLINIC_OR_DEPARTMENT_OTHER): Payer: Medicare Other | Admitting: Nurse Practitioner

## 2019-11-21 ENCOUNTER — Inpatient Hospital Stay: Payer: Medicare Other | Attending: Nurse Practitioner

## 2019-11-21 ENCOUNTER — Encounter: Payer: Self-pay | Admitting: Nurse Practitioner

## 2019-11-21 ENCOUNTER — Other Ambulatory Visit: Payer: Medicare Other

## 2019-11-21 ENCOUNTER — Other Ambulatory Visit: Payer: Self-pay

## 2019-11-21 VITALS — BP 136/91 | HR 98 | Temp 98.9°F | Resp 18 | Ht 68.0 in | Wt 204.9 lb

## 2019-11-21 DIAGNOSIS — C50412 Malignant neoplasm of upper-outer quadrant of left female breast: Secondary | ICD-10-CM | POA: Diagnosis not present

## 2019-11-21 DIAGNOSIS — Z171 Estrogen receptor negative status [ER-]: Secondary | ICD-10-CM | POA: Diagnosis not present

## 2019-11-21 DIAGNOSIS — J45909 Unspecified asthma, uncomplicated: Secondary | ICD-10-CM | POA: Insufficient documentation

## 2019-11-21 DIAGNOSIS — R296 Repeated falls: Secondary | ICD-10-CM | POA: Diagnosis not present

## 2019-11-21 DIAGNOSIS — R221 Localized swelling, mass and lump, neck: Secondary | ICD-10-CM | POA: Diagnosis not present

## 2019-11-21 DIAGNOSIS — G620 Drug-induced polyneuropathy: Secondary | ICD-10-CM | POA: Diagnosis not present

## 2019-11-21 DIAGNOSIS — F419 Anxiety disorder, unspecified: Secondary | ICD-10-CM | POA: Diagnosis not present

## 2019-11-21 DIAGNOSIS — Z96652 Presence of left artificial knee joint: Secondary | ICD-10-CM | POA: Diagnosis not present

## 2019-11-21 DIAGNOSIS — M8580 Other specified disorders of bone density and structure, unspecified site: Secondary | ICD-10-CM | POA: Insufficient documentation

## 2019-11-21 LAB — CBC WITH DIFFERENTIAL (CANCER CENTER ONLY)
Abs Immature Granulocytes: 0.01 10*3/uL (ref 0.00–0.07)
Basophils Absolute: 0 10*3/uL (ref 0.0–0.1)
Basophils Relative: 1 %
Eosinophils Absolute: 0 10*3/uL (ref 0.0–0.5)
Eosinophils Relative: 1 %
HCT: 39.9 % (ref 36.0–46.0)
Hemoglobin: 12.7 g/dL (ref 12.0–15.0)
Immature Granulocytes: 0 %
Lymphocytes Relative: 45 %
Lymphs Abs: 1.9 10*3/uL (ref 0.7–4.0)
MCH: 28.4 pg (ref 26.0–34.0)
MCHC: 31.8 g/dL (ref 30.0–36.0)
MCV: 89.3 fL (ref 80.0–100.0)
Monocytes Absolute: 0.4 10*3/uL (ref 0.1–1.0)
Monocytes Relative: 9 %
Neutro Abs: 1.8 10*3/uL (ref 1.7–7.7)
Neutrophils Relative %: 44 %
Platelet Count: 257 10*3/uL (ref 150–400)
RBC: 4.47 MIL/uL (ref 3.87–5.11)
RDW: 14.1 % (ref 11.5–15.5)
WBC Count: 4.1 10*3/uL (ref 4.0–10.5)
nRBC: 0 % (ref 0.0–0.2)

## 2019-11-21 LAB — CMP (CANCER CENTER ONLY)
ALT: 28 U/L (ref 0–44)
AST: 34 U/L (ref 15–41)
Albumin: 3.8 g/dL (ref 3.5–5.0)
Alkaline Phosphatase: 115 U/L (ref 38–126)
Anion gap: 8 (ref 5–15)
BUN: 11 mg/dL (ref 8–23)
CO2: 26 mmol/L (ref 22–32)
Calcium: 8.6 mg/dL — ABNORMAL LOW (ref 8.9–10.3)
Chloride: 105 mmol/L (ref 98–111)
Creatinine: 0.82 mg/dL (ref 0.44–1.00)
GFR, Est AFR Am: >60 mL/min (ref >60)
GFR, Estimated: >60 mL/min (ref >60)
Glucose, Bld: 92 mg/dL (ref 70–99)
Potassium: 3.9 mmol/L (ref 3.5–5.1)
Sodium: 139 mmol/L (ref 135–145)
Total Bilirubin: 0.3 mg/dL (ref 0.3–1.2)
Total Protein: 7.6 g/dL (ref 6.5–8.1)

## 2019-11-21 NOTE — Progress Notes (Addendum)
[place]   [phone] [phone]   Clinic Follow up Note   Patient Care Team: [facility], Modena Nunnery, MD as PCP - General (Family Medicine) Truitt Merle, MD as Consulting Physician (Hematology) Eppie Gibson, MD as Attending Physician (Radiation Oncology) Alla Feeling, NP as Nurse Practitioner (Nurse Practitioner) Virl Cagey, MD as Consulting Physician (General Surgery) Daneil Dolin, MD as Consulting Physician (Gastroenterology) 11/21/2019  CHIEF COMPLAINT: F/u triple negative left breast   SUMMARY OF ONCOLOGIC HISTORY: Oncology History Overview Note  Cancer Staging Malignant neoplasm of upper-outer quadrant of left breast in female, estrogen receptor negative ([place]) Staging form: Breast, AJCC 8th Edition - Pathologic stage from 08/19/2017: Stage IIA (pT2, pN0(sn), cM0, G3, ER: Negative, PR: Negative, HER2: Negative) - Signed by Ardath Sax, MD on 09/01/2017     Malignant neoplasm of upper-outer quadrant of left breast in female, estrogen receptor negative ([place])  06/16/2017 Initial Diagnosis   Malignant neoplasm of upper-outer quadrant of left breast in female: Initially presented with left deltoid mass which was consistent with lipoma.  During examination, a mass in the left breast was appreciated by palpation.   06/16/2017 Mammogram   BL Diagnostic Study: Irregular hyperdense mass in the upper outer quadrant of the left breast measuring 3.6 cm without radiographically detectable lymphadenopathy.  Normal appearance of the right breast.   06/16/2017 Breast US   3.2 cm irregular mass located at 2:30 o'clock, no pathological lymphadenopathy.   06/23/2017 Pathology Results   Ultrasound-guided biopsy: Positive for invasive ductal carcinoma, grade 3, ER 0%, PR 0%, HER-2 negative by [facility]   07/31/2017 Surgery   Partial left mastectomy with sentinel lymph node biopsy --Pathology: Invasive ductal carcinoma, measuring 4.5 cm, grade 3.  All  margins negative, but the anterior margin less than 0.1 cm.  No surrounding DCIS.  0/5 lymph nodes positive for malignancy   10/08/2017 PET scan   IMPRESSION: 1. No evidence breast cancer metastasis on FDG PET-CT scan. 2. Postsurgical change in the LEFT lateral breast and LEFT axilla.   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 Significance The following genes were evaluated for sequence changes and exonic deletions/duplications: ALK, APC, ATM, AXIN2, BAP1, BARD1, BLM, BMPR1A, BRCA1, BRCA2, BRIP1, CASR, CDC73, CDH1, CDK4, CDKN1B, CDKN1C, CDKN2A (p14ARF), CDKN2A (p16INK4a), CEBPA, CHEK2, CTNNA1, DICER1, DIS3L2, EPCAM*, FH, FLCN, GATA2, GPC3, GREM1*, HRAS, KIT, MAX, MEN1, MET, MLH1, MSH2, MSH3, MSH6, MUTYH, NBN, NF1, NF2, PALB2, PDGFRA, PHOX2B*, PMS2, POLD1, POLE, POT1, PRKAR1A, PTCH1, PTEN, RAD50, RAD51C, RAD51D, RB1, RECQL4, RET, RUNX1, SDHAF2, SDHB, SDHC, SDHD, SMAD4, SMARCA4, SMARCB1, SMARCE1, STK11, SUFU, TERC, TERT, TMEM127, TP53, TSC1, TSC2, VHL, WRN*, WT1 The following genes were evaluated for sequence changes only: EGFR*, HOXB13*, MITF*, NTHL1*, SDHA Results are negative unless otherwise indicated   05/27/2018 - 06/23/2018 Radiation Therapy   Radiation treatment dates:   05/27/2018 - 06/23/2018  Site/dose:    1. Left Breast / 40.05 Gy in 15 fractions 2. Left Breast Boost / 10 Gy in 5 fractions - Total dose of 50.05 Gy  Beams/energy:    1. 3D / 6X Photon 2. Isodose / 10X, 6X Photon  Narrative: The patient tolerated radiation treatment relatively well.  She experienced fatigue and some expected skin irritation with mild erythema and soreness to her breast/axilla. She  is applying Radiaplex twice daily as  directed. She also mentioned bilateral hand cramping with bruising. She was advised to discuss this with her PCP as it is not related to her radiation treatment.    Survivorship   Per Cira Rue, NP      CURRENT THERAPY:  Surveillance   INTERVAL HISTORY: Mary Wise returns for f/u as scheduled. She was last seen 03/2019. She is overdue for annual mammogram. She had left knee replacement since last visit, continues PT for knee. She uses a cane. She lost footing a couple times and had minor falls. Otherwise she is doing well. She is trying to watch her weight and control diet. Denies change in bowel habits. Denies any bleeding. Denies change in bowel habits. Denies new abdominal or joint pain. Denies recent infection. Chronic asthma is controlled. She has mild residual neuropathy to fingers and feet, no functional difficulties. She does not attribute her fall to neuropathy. She is on gabapentin which is helping. Denies new lump or nipple change in breasts. She feels heaviness in left breast when she bends over. She has a nodule near her spine, she states chemo "hardened it."    MEDICAL HISTORY:  Past Medical History:  Diagnosis Date  . Anxiety   . Asthma   . Cancer [facility])    left breast cancer  . Family history of cancer   . Genetic testing 02/01/2018   Multi-Cancer panel (83 genes) @ [facility] - No pathogenic mutations detected  . Hemorrhoids   . History of radiation therapy 05/27/18- 06/23/18   Left Breast 40.05 Gy in 15 fractions, left breast boost 10 Gy in 5 fractions.   . Lipoma of left shoulder   . Migraine   . Migraine   . Osteopenia   . 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 ABDOMINAL HYSTERECTOMY    . APPENDECTOMY    . CESAREAN SECTION    . CHOLECYSTECTOMY    . ERCP    . IR FLUORO GUIDE PORT INSERTION RIGHT  10/05/2017  . IR REMOVAL TUN ACCESS W/ PORT W/O FL MOD SED  05/13/2018  . IR US  GUIDE VASC ACCESS RIGHT  10/05/2017  . LIPOMA EXCISION Left 03/16/2018   left shoulder  . LIVER SURGERY    . PARTIAL MASTECTOMY WITH AXILLARY SENTINEL LYMPH NODE BIOPSY Left 07/31/2017   Procedure: LEFT PARTIAL MASTECTOMY WITH AXILLARY SENTINEL LYMPH NODE BIOPSY;  Surgeon: Virl Cagey, MD;  Location: [facility];  Service: General;  Laterality: Left;  . TOTAL KNEE ARTHROPLASTY Left 07/05/2019   Procedure: TOTAL KNEE ARTHROPLASTY;  Surgeon: Renette Butters, MD;  Location: [facility];  Service: Orthopedics;  Laterality: Left;    I have reviewed the social history and family history with the patient and they are unchanged from previous note.  ALLERGIES:  is allergic to onion; penicillins; sulfa antibiotics; yellow dye; tape; clindamycin/lincomycin; and lipitor [atorvastatin calcium].  MEDICATIONS:  Current Outpatient Medications  Medication Sig Dispense Refill  . albuterol (PROVENTIL) (2.5 MG/3ML) 0.083% nebulizer solution Take 3 mLs (2.5 mg total) by nebulization every 6 (six) hours as needed for wheezing or shortness of breath. 90 mL 3  . albuterol (VENTOLIN HFA) 108 (90 Base) MCG/ACT inhaler INHALE 2 PUFFS BY MOUTH EVERY 6 HOURS AS NEEDED FOR WHEEZING FOR SHORTNESS OF BREATH 18 g 0  . ALPRAZolam (XANAX) 1 MG tablet Take 1 tablet by mouth twice daily  as needed for anxiety 60 tablet 1  . baclofen (LIORESAL) 10 MG tablet Take 1 tablet by mouth three times daily as needed for muscle spasm 30 tablet 1  . gabapentin (NEURONTIN) 400 MG capsule Take 1 capsule (400 mg total) by mouth 3 (three) times daily. 90 capsule 3  . ibuprofen (ADVIL) 800 MG tablet TAKE 1 TABLET BY MOUTH EVERY 8 HOURS AS NEEDED 30 tablet 0  . LASTACAFT 0.25 % SOLN Place 1 drop into both eyes every other day.    . lidocaine (LIDODERM) 5 % Place 0.5-1 patches onto the skin daily as needed (pain). Remove & Discard patch within 12 hours or as directed by MD 30 patch 3  . omeprazole (PRILOSEC) 20 MG capsule Take 1 capsule (20 mg total)  by mouth daily. 30 days for gastroprotection while taking NSAIDs. 90 capsule 1  . ondansetron (ZOFRAN) 4 MG tablet Take 1 tablet (4 mg total) by mouth every 8 (eight) hours as needed for nausea or vomiting. 20 tablet 0  . simvastatin (ZOCOR) 20 MG tablet Take 1 tablet (20 mg total) by mouth 3 (three) times a week. (Patient taking differently: Take 20 mg by mouth every Monday, Wednesday, and Friday. ) 30 tablet 3  . SUMAtriptan (IMITREX) 100 MG tablet TAKE 1 TABLET BY MOUTH AT ONSET OF MIGRAINE, IF SYMPTOMS PERSIST 2 HOURS LATER THEN TAKE AN ADDITIONAL DOSE--MAX OF 2 TABLETS PER 24 HOURS-- 9 tablet 0  . zolpidem (AMBIEN) 10 MG tablet TAKE 1 TABLET BY MOUTH AT BEDTIME AS NEEDED FOR SLEEP 30 tablet 2   No current facility-administered medications for this visit.    PHYSICAL EXAMINATION: ECOG PERFORMANCE STATUS: 1 - Symptomatic but completely ambulatory  Vitals:   11/21/19 1432  BP: (!) 136/91  Pulse: 98  Resp: 18  Temp: 98.9 F (37.2 C)  SpO2: 99%   Filed Weights   11/21/19 1432  Weight: 204 lb 14.4 oz (92.9 kg)    GENERAL:alert, no distress and comfortable SKIN: no rash  EYES: sclera clear LYMPH:  no palpable cervical, supraclavicular, or axillary lymphadenopathy LUNGS: clear with normal breathing effort HEART: regular rate & rhythm, no lower extremity edema Musculoskeletal: no focal spinal tenderness. 3 cm firm fixed nodule to the upper spine  NEURO: alert & oriented x 3 with fluent speech BREAST: s/p left lumpectomy and radiation. Incisions completely healed. Tender area of soft tissue density in left upper inner quadrant, no discrete mass in the area or in either breast or axilla that I could appreciate   LABORATORY DATA:  I have reviewed the data as listed CBC Latest Ref Rng & Units 11/21/2019 09/19/2019 06/27/2019  WBC 4.0 - 10.5 K/uL 4.1 7.2 7.0  Hemoglobin 12.0 - 15.0 g/dL 12.7 13.4 13.6  Hematocrit 36.0 - 46.0 % 39.9 40.3 42.5  Platelets 150 - 400 K/uL 257 356 318      CMP Latest Ref Rng & Units 11/21/2019 09/19/2019 06/27/2019  Glucose 70 - 99 mg/dL 92 91 89  BUN 8 - 23 mg/dL _0 Creatinine 0.44 - 1.00 mg/dL 0.82 0.69 0.80  Sodium 135 - 145 mmol/L 139 141 140  Potassium 3.5 - 5.1 mmol/L 3.9 4.1 3.5  Chloride 98 - 111 mmol/L 105 101 104  CO2 22 - 32 mmol/L _1 Calcium 8.9 - 10.3 mg/dL 8.6(L) 9.8 9.4  Total Protein 6.5 - 8.1 g/dL 7.6 7.6 -  Total Bilirubin 0.3 - 1.2 mg/dL 0.3 0.4 -  Alkaline Phos 38 -  126 U/L 115 - -  AST 15 - 41 U/L 34 18 -  ALT 0 - 44 U/L 28 15 -      RADIOGRAPHIC STUDIES: I have personally reviewed the radiological images as listed and agreed with the findings in the report. No results found.   ASSESSMENT & PLAN: Mary Wise is a 69 y.o. female with    1. 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 Proliferation marker Ki67 at 90% -She was diagnosed in 06/2017. S/p partial left mastectomy, Adjuvant chemo AC-T and adjuvant radiation. -SCP delivered in 12/2018 -on surveillance  -Mary Wise is clinically doing well. Labs are unremarkable. Breast exam is benign except small area of density in upper inner left breast that is tender, no discrete mass. This is likely fibroglandular tissue vs scar tissue vs seroma. She is overdue for annual mammogram. I have placed the order for diagnostic mammogram and left [place] if needed. I will call her with results. Overall I have low suspicion for recurrent breast cancer  -she is otherwise doing well, recovering from knee replacement. -if mammogram/US are negative, continue breast cancer surveillance with next routine visit in 6 months   2. Cystic nodule to upper spine -She has had this for about 3 years, but is now interested in removal if insurance will cover it -I referred her to Dr. Jari Pigg of dermatology. She will ask the front office if this is covered before proceeding  3. Anxiety, insomnia  -controlled on xanax and ambien PRN  4. Arthralgia and back pain, generalized muscle aches  -Controlled with baclofen, previously on flexeril but lost its efficacy  -s/p left knee replacement, undergoing PT -ambulates with cane  -follwed by Dr. Fredonia Highland  5. Osteopenia -last DEXA In 02/2019 shows osteopenia at the [facility] spine. She has low calcium 8.6 today but is intolerant to calcium supplements. She does not eat much dairy  -she states Dr. Percell Miller has her vitamin D on hold for now given recent knee replacement.  -I encouraged her to continue recovery, PT, and activity as tolerated   6. 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 Genetictesting- negative  PLAN: -Labs reviewed -Proceed with mammogram and possible [place], orders placed today -If negative, next routine surveillance f/u in 6 months  -referral to dermatology for cyst removal at the upper spine    No problem-specific Assessment & Plan notes found for this encounter.   Orders Placed This Encounter  Procedures  . MM DIAG BREAST TOMO BILATERAL    Standing Status:   Future    Standing Expiration Date:   11/20/2020    Order Specific Question:   Reason for Exam (SYMPTOM  OR DIAGNOSIS REQUIRED)    Answer:   h/o triple negative left breast ca s/p lumpectomy, chemo, RT. last mammo 10/2018. left breast tenderness    Order Specific Question:   Preferred imaging location?    Answer:   [facility]  . US BREAST LTD UNI LEFT INC AXILLA    Standing Status:   Future    Standing Expiration Date:   01/20/2021    Order Specific Question:   Reason for Exam (SYMPTOM  OR DIAGNOSIS REQUIRED)    Answer:   h/p triple negative left breast ca s/p lumpectomy, chemo, RT. left breast tenderness    Order Specific Question:   Preferred imaging location?  Answer:   [facility]   All questions were answered. The patient knows to call the clinic with any problems,  questions or concerns. No barriers to learning was detected.     Alla Feeling, NP 11/21/19

## 2019-11-22 ENCOUNTER — Telehealth: Payer: Self-pay | Admitting: Nurse Practitioner

## 2019-11-22 ENCOUNTER — Telehealth: Payer: Self-pay

## 2019-11-22 NOTE — Telephone Encounter (Signed)
Scheduled appt per 4/19 los. Left voicemail with new appt details.

## 2019-11-22 NOTE — Telephone Encounter (Signed)
TC per Mary Wise to the breast center of  [phone]) to let them know that her mammogram is supposed to be bilateral and her ultrasound is supposed to be of the left breast. They stated that they would get this changed asap.

## 2019-11-23 ENCOUNTER — Other Ambulatory Visit: Payer: Self-pay | Admitting: Family Medicine

## 2019-11-23 DIAGNOSIS — J4520 Mild intermittent asthma, uncomplicated: Secondary | ICD-10-CM

## 2019-12-01 ENCOUNTER — Other Ambulatory Visit: Payer: Self-pay | Admitting: Family Medicine

## 2019-12-01 DIAGNOSIS — J4520 Mild intermittent asthma, uncomplicated: Secondary | ICD-10-CM

## 2019-12-02 ENCOUNTER — Other Ambulatory Visit: Payer: Self-pay

## 2019-12-02 ENCOUNTER — Other Ambulatory Visit: Payer: Self-pay | Admitting: Nurse Practitioner

## 2019-12-02 ENCOUNTER — Ambulatory Visit
Admission: RE | Admit: 2019-12-02 | Discharge: 2019-12-02 | Disposition: A | Discharge status: Home / Self Care | Payer: Medicare Other | Source: Ambulatory Visit | Attending: Nurse Practitioner | Admitting: Nurse Practitioner

## 2019-12-02 DIAGNOSIS — R928 Other abnormal and inconclusive findings on diagnostic imaging of breast: Secondary | ICD-10-CM | POA: Diagnosis not present

## 2019-12-02 DIAGNOSIS — Z171 Estrogen receptor negative status [ER-]: Secondary | ICD-10-CM

## 2019-12-02 DIAGNOSIS — C50412 Malignant neoplasm of upper-outer quadrant of left female breast: Secondary | ICD-10-CM

## 2019-12-02 DIAGNOSIS — N644 Mastodynia: Secondary | ICD-10-CM | POA: Diagnosis not present

## 2019-12-05 ENCOUNTER — Telehealth: Payer: Self-pay | Admitting: *Deleted

## 2019-12-05 NOTE — Telephone Encounter (Signed)
Notified of message below. Verbalized understanding 

## 2019-12-05 NOTE — Telephone Encounter (Signed)
-----   Message from Alla Feeling, NP sent at 12/04/2019  9:25 PM EDT ----- Please let her know mammogram and [place] are negative, shows normal breast tissue and lumpectomy changes. We'll see her back in the Fall as planned, or sooner if she has concerns.   Thanks, Regan Rakers

## 2019-12-14 ENCOUNTER — Other Ambulatory Visit: Payer: Self-pay | Admitting: Family Medicine

## 2019-12-14 DIAGNOSIS — F419 Anxiety disorder, unspecified: Secondary | ICD-10-CM

## 2019-12-14 NOTE — Telephone Encounter (Signed)
Ok to refill??  Last office visit 09/19/2019.  Last refill 10/10/2019, #1 refill.

## 2019-12-26 ENCOUNTER — Ambulatory Visit: Payer: Medicare Other | Admitting: Neurology

## 2019-12-27 ENCOUNTER — Other Ambulatory Visit: Payer: Self-pay | Admitting: Family Medicine

## 2019-12-29 ENCOUNTER — Other Ambulatory Visit: Payer: Self-pay | Admitting: Family Medicine

## 2020-01-04 DIAGNOSIS — Z23 Encounter for immunization: Secondary | ICD-10-CM | POA: Diagnosis not present

## 2020-01-12 ENCOUNTER — Other Ambulatory Visit: Payer: Self-pay | Admitting: Family Medicine

## 2020-01-12 NOTE — Telephone Encounter (Signed)
Last office visit: 09/19/2019 Last refilled: 10/10/2019

## 2020-01-23 ENCOUNTER — Other Ambulatory Visit: Payer: Self-pay | Admitting: Pharmacist

## 2020-01-27 ENCOUNTER — Encounter: Payer: Self-pay | Admitting: Family Medicine

## 2020-01-27 ENCOUNTER — Other Ambulatory Visit: Payer: Self-pay

## 2020-01-27 ENCOUNTER — Ambulatory Visit (INDEPENDENT_AMBULATORY_CARE_PROVIDER_SITE_OTHER): Payer: Medicare Other | Admitting: Family Medicine

## 2020-01-27 VITALS — BP 128/64 | HR 100 | Temp 98.5°F | Resp 14 | Ht 68.0 in | Wt 205.0 lb

## 2020-01-27 DIAGNOSIS — I10 Essential (primary) hypertension: Secondary | ICD-10-CM

## 2020-01-27 DIAGNOSIS — E782 Mixed hyperlipidemia: Secondary | ICD-10-CM

## 2020-01-27 DIAGNOSIS — Z0001 Encounter for general adult medical examination with abnormal findings: Secondary | ICD-10-CM

## 2020-01-27 DIAGNOSIS — Z0000 Encounter for general adult medical examination without abnormal findings: Secondary | ICD-10-CM

## 2020-01-27 DIAGNOSIS — G620 Drug-induced polyneuropathy: Secondary | ICD-10-CM | POA: Diagnosis not present

## 2020-01-27 DIAGNOSIS — J4541 Moderate persistent asthma with (acute) exacerbation: Secondary | ICD-10-CM | POA: Diagnosis not present

## 2020-01-27 DIAGNOSIS — L918 Other hypertrophic disorders of the skin: Secondary | ICD-10-CM

## 2020-01-27 DIAGNOSIS — L720 Epidermal cyst: Secondary | ICD-10-CM

## 2020-01-27 MED ORDER — PREDNISONE 20 MG PO TABS
40.0000 mg | ORAL_TABLET | Freq: Every day | ORAL | 0 refills | Status: DC
Start: 2020-01-27 — End: 2020-04-19

## 2020-01-27 MED ORDER — FLOVENT HFA 110 MCG/ACT IN AERO
1.0000 | INHALATION_SPRAY | Freq: Two times a day (BID) | RESPIRATORY_TRACT | 3 refills | Status: DC
Start: 2020-01-27 — End: 2020-05-29

## 2020-01-27 NOTE — Patient Instructions (Addendum)
Dr. Curlene Labrum REFERRAL FOR CYST REMOVAL We will call with lab results F/U 4 months

## 2020-01-27 NOTE — Progress Notes (Signed)
Subjective:   Patient presents for Medicare Annual/Subsequent preventive examination.   Pt here for medicare physical    medications and history reviewed    Dr. Burr Medico Oncologist- left sided triple breast cancer/   she is in surveillance with oncology   mammogram UTD     Age 69  had hysterectomy- for DUB   Has neurppathy from the radiation/chemo  treatments - Taking gabapentin 400mg  three times a day  , this is controlling symptoms   GAD/ chronic insomnia- controlled with xanax prn and ambien        Migraine disorder- Uses imitrex - fills every 2 months or so       Asthma -has been using albuterol more this week, felt tight in the chest, states her oxygen was down into the 80's but came back up 2 days ago, she did not want to go to ER , she has been suing albuteroll more due to her allergies the pastfew months in general  no sinus pressure or drainage no fever, if she coughs more dry, but she has been wheezing   Mild Hyperlipidemiataking statin drug three times weeks   History of arthritis in hands, back, has scoliosis, uses theraworks OTC and this works   OA knees- s/p left knee rurgery, she is on baclofen and ibuprofen as needed, as completed PT      Has skin tag on on right neck, lsion on right upperarm , neck , and cyst on center of spine that needs to be removed          Review Past Medical/Family/Social: Per EMR    Risk Factors  Current exercise habits: Keeps active Dietary issues discussed: No major concerns   Cardiac risk factors: Obesity (BMI >= 30 kg/m2). HTN, hyperlipidemia   Depression Screen  (Note: if answer to either of the following is "Yes", a more complete depression screening is indicated)  Over the past two weeks, have you felt down, depressed or hopeless? No Over the past two weeks, have you felt little interest or pleasure in doing things? No Have you lost interest or pleasure in daily life? No Do you often feel hopeless? No Do you  cry easily over simple problems? No   Activities of Daily Living  In your present state of health, do you have any difficulty performing the following activities?:  Driving? No  Managing money? No  Feeding yourself? No  Getting from bed to chair? No  Climbing a flight of stairs? No  Preparing food and eating?: No  Bathing or showering? No  Getting dressed: No  Getting to the toilet? No  Using the toilet:No  Moving around from place to place: No  In the past year have you fallen or had a near fall?:No  Are you sexually active? No  Do you have more than one partner? No   Hearing Difficulties: No  Do you often ask people to speak up or repeat themselves? No  Do you experience ringing or noises in your ears? No Do you have difficulty understanding soft or whispered voices? No  Do you feel that you have a problem with memory? No Do you often misplace items? No  Do you feel safe at home? Yes  Cognitive Testing  Alert? Yes Normal Appearance?Yes  Oriented to person? Yes Place? Yes  Time? Yes  Recall of three objects? Yes  Can perform simple calculations? Yes  Displays appropriate judgment?Yes  Can read the correct time from a watch face?Yes   List  the Names of Other Physician/Practitioners you currently use:  Oncology- [facility]    Screening Tests / Date Colonoscopy  - planning for the Fall  [place]- Due                Zostavax -  Declines  Pneumonia- declines  Mammogram UTD COVID-19  UTD Influenza Vaccine Declines  Tetanus/tdap Declines   ROS: GEN- denies fatigue, fever, weight loss,weakness, recent illness HEENT- denies eye drainage, change in vision, nasal discharge, CVS- denies chest pain, palpitations RESP-+ SOB, cough, +wheeze ABD- denies N/V, change in stools, abd pain GU- denies dysuria, hematuria, dribbling, incontinence MSK- + joint pain, muscle aches, injury Neuro- denies headache, dizziness, syncope, seizure activity  GEN- NAD, alert  and oriented ,vitals reviewed  HEENT- PERRL, EOMI, non injected sclera, pink conjunctiva, MMM, oropharynx clear, TM clear no effusion, nares clear  Neck- Supple, no thryomegaly, no bruit  CVS- RRR, no murmur RESP-normal WOB, no wheeze, no rales  ABD-NABS,soft,NT,ND MSK- decreased ROM Lower ext, antalgic stiff gait  EXT- No edema Pulses- Radial, DP- 2+   Assessment:    Annual wellness medicare exam   Plan:    During the course of the visit the patient was educated and counseled about appropriate screening and preventive services including:     Pt is Full Code daughter is POA   Colon cancer screening plan for colonoscopy in the fall, she had negative cologuard but due to other cancers recommend she get actual colonoscopy - oncology also recommends       Neuropathy - continue gabapentin  HTN- controlled   OA knee- s/p surgery meds per orthopedics   Skin tags, cyst- referral to general surgery for removal    Asthma with acute exacerbation- increased use of inhaler, sat down to 80's 2 days ago  treat with prednisone burst, will add flovent for maintanance, discussed use of inhalers    Fall /depression/Audit C screening negative    Chronic insomnia- ambien prn   GAD- prn xanax  Migraines- imitrex prn       Patient Instructions (the written plan) was given to the patient.  Medicare Attestation  I have personally reviewed:  The patient's medical and social history  Their use of alcohol, tobacco or illicit drugs  Their current medications and supplements  The patient's functional ability including ADLs,fall risks, home safety risks, cognitive, and hearing and visual impairment  Diet and physical activities  Evidence for depression or mood disorders  The patient's weight, height, BMI, and visual acuity have been recorded in the chart. I have made referrals, counseling, and provided education to the patient based on review of the above and I have  provided the patient with a written personalized care plan for preventive services.

## 2020-01-28 LAB — COMPREHENSIVE METABOLIC PANEL
AG Ratio: 1.2 (calc) (ref 1.0–2.5)
ALT: 20 U/L (ref 6–29)
AST: 21 U/L (ref 10–35)
Albumin: 4.2 g/dL (ref 3.6–5.1)
Alkaline phosphatase (APISO): 112 U/L (ref 37–153)
BUN: 13 mg/dL (ref 7–25)
CO2: 27 mmol/L (ref 20–32)
Calcium: 9.8 mg/dL (ref 8.6–10.4)
Chloride: 102 mmol/L (ref 98–110)
Creat: 0.84 mg/dL (ref 0.50–0.99)
Globulin: 3.4 g/dL (calc) (ref 1.9–3.7)
Glucose, Bld: 82 mg/dL (ref 65–99)
Potassium: 4.1 mmol/L (ref 3.5–5.3)
Sodium: 140 mmol/L (ref 135–146)
Total Bilirubin: 0.3 mg/dL (ref 0.2–1.2)
Total Protein: 7.6 g/dL (ref 6.1–8.1)

## 2020-01-28 LAB — LIPID PANEL
Cholesterol: 224 mg/dL — ABNORMAL HIGH (ref <200)
HDL: 57 mg/dL (ref >=50)
LDL Cholesterol (Calc): 136 mg/dL (calc) — ABNORMAL HIGH
Non-HDL Cholesterol (Calc): 167 mg/dL (calc) — ABNORMAL HIGH (ref <130)
Total CHOL/HDL Ratio: 3.9 (calc) (ref <5.0)
Triglycerides: 179 mg/dL — ABNORMAL HIGH (ref <150)

## 2020-01-28 LAB — CBC WITH DIFFERENTIAL/PLATELET
Absolute Monocytes: 454 cells/uL (ref 200–950)
Basophils Absolute: 101 cells/uL (ref 0–200)
Basophils Relative: 1.4 %
Eosinophils Absolute: 353 cells/uL (ref 15–500)
Eosinophils Relative: 4.9 %
HCT: 40.2 % (ref 35.0–45.0)
Hemoglobin: 13.3 g/dL (ref 11.7–15.5)
Lymphs Abs: 3103 cells/uL (ref 850–3900)
MCH: 29.1 pg (ref 27.0–33.0)
MCHC: 33.1 g/dL (ref 32.0–36.0)
MCV: 88 fL (ref 80.0–100.0)
MPV: 10.5 fL (ref 7.5–12.5)
Monocytes Relative: 6.3 %
Neutro Abs: 3190 cells/uL (ref 1500–7800)
Neutrophils Relative %: 44.3 %
Platelets: 346 10*3/uL (ref 140–400)
RBC: 4.57 10*6/uL (ref 3.80–5.10)
RDW: 13.2 % (ref 11.0–15.0)
Total Lymphocyte: 43.1 %
WBC: 7.2 10*3/uL (ref 3.8–10.8)

## 2020-01-29 ENCOUNTER — Encounter: Payer: Self-pay | Admitting: Family Medicine

## 2020-02-01 ENCOUNTER — Encounter: Payer: Self-pay | Admitting: *Deleted

## 2020-02-07 ENCOUNTER — Other Ambulatory Visit: Payer: Self-pay | Admitting: Family Medicine

## 2020-02-08 ENCOUNTER — Telehealth: Payer: Self-pay | Admitting: *Deleted

## 2020-02-08 DIAGNOSIS — H9313 Tinnitus, bilateral: Secondary | ICD-10-CM

## 2020-02-08 NOTE — Telephone Encounter (Signed)
Received call from patient.   Reports that she was trying to refill  Antipyrine/Benzocaine OT Gtts, but was advised that pharmacy never received refill.   Call placed to pharmacy to inquire. Was advised that medication is no longer being manufactured. States that there is no comparable medications at this time. Reports that current Otic Gtts either include steroid or antinfective.  Call placed to patient and patient made aware.   States that she is using gtts for tinnitus.

## 2020-02-08 NOTE — Telephone Encounter (Signed)
Call pt I dont have any other options since the drops are no longer made  She did not have ear infection So I dont have any other drop to give her to treat the tinnitus We can get her an appt with ENT to see if there is alternative treatment that will help her

## 2020-02-08 NOTE — Telephone Encounter (Signed)
Call placed to patient and patient made aware per VM.   Referral orders placed.

## 2020-02-12 ENCOUNTER — Other Ambulatory Visit: Payer: Self-pay | Admitting: Family Medicine

## 2020-02-12 DIAGNOSIS — F419 Anxiety disorder, unspecified: Secondary | ICD-10-CM

## 2020-02-13 NOTE — Telephone Encounter (Signed)
Ok to refill??  Last office visit 01/27/2020.  Last refill 12/14/2019, #1 refill.

## 2020-02-16 ENCOUNTER — Encounter: Payer: Self-pay | Admitting: General Surgery

## 2020-02-16 ENCOUNTER — Ambulatory Visit (INDEPENDENT_AMBULATORY_CARE_PROVIDER_SITE_OTHER): Payer: Medicare Other | Admitting: General Surgery

## 2020-02-16 ENCOUNTER — Other Ambulatory Visit: Payer: Self-pay

## 2020-02-16 DIAGNOSIS — L723 Sebaceous cyst: Secondary | ICD-10-CM | POA: Diagnosis not present

## 2020-02-16 DIAGNOSIS — R591 Generalized enlarged lymph nodes: Secondary | ICD-10-CM | POA: Diagnosis not present

## 2020-02-16 DIAGNOSIS — L918 Other hypertrophic disorders of the skin: Secondary | ICD-10-CM

## 2020-02-16 DIAGNOSIS — R599 Enlarged lymph nodes, unspecified: Secondary | ICD-10-CM

## 2020-02-16 NOTE — Progress Notes (Signed)
[facility] History and Physical  Reason for Referral: Sebaceous Cyst/ Skin Tag  Referring Physician:  Alycia Rossetti, MD  Chief Complaint    New Patient (Initial Visit)      Mary Wise is a 69 y.o. female.  HPI: Mary Wise is a 69 yo who I know after she had a left breast cancer surgery and has been doing well since that time.  She has been seen by her PCP, and has multiple skin lesions on multiple parts of her body. She reports having a skin tag on her right shoulder at the clavicle area, a skin tag under her left upper arm, a large sebaceous cyst with a head on her back, a cyst like area on her left neck, a flesh colored mole on her right neck, and small skin tag between her breast.  She says that she has issues with the back cyst being swollen and inflamed and required antibiotics in the past.  The patient denies any drainage from the area.  She says the other skin lesion in the right shoulder, left arm skin tag, the skin tag between the breast, and cyst on the left neck only cause issues with catching on clothes and rubbing.    She also reports this "knot" and tenderness in the right neck. She says that she had some left ear infection and sinuitis in the past, but says that this knot has been there for about 1 year and has been tender.   Past Medical History:  Diagnosis Date  . Anxiety   . Asthma   . Cancer [facility])    left breast cancer  . Family history of cancer   . Genetic testing 02/01/2018   Multi-Cancer panel (83 genes) @ [facility] - No pathogenic mutations detected  . Hemorrhoids   . History of radiation therapy 05/27/18- 06/23/18   Left Breast 40.05 Gy in 15 fractions, left breast boost 10 Gy in 5 fractions.   . Lipoma of left shoulder   . Migraine   . Migraine   . Osteopenia   . 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 ABDOMINAL HYSTERECTOMY    . APPENDECTOMY    . CESAREAN SECTION     . CHOLECYSTECTOMY    . ERCP    . IR FLUORO GUIDE PORT INSERTION RIGHT  10/05/2017  . IR REMOVAL TUN ACCESS W/ PORT W/O FL MOD SED  05/13/2018  . IR US GUIDE VASC ACCESS RIGHT  10/05/2017  . LIPOMA EXCISION Left 03/16/2018   left shoulder  . LIVER SURGERY    . PARTIAL MASTECTOMY WITH AXILLARY SENTINEL LYMPH NODE BIOPSY Left 07/31/2017   Procedure: LEFT PARTIAL MASTECTOMY WITH AXILLARY SENTINEL LYMPH NODE BIOPSY;  Surgeon: Virl Cagey, MD;  Location: [facility];  Service: General;  Laterality: Left;  . TOTAL KNEE ARTHROPLASTY Left 07/05/2019   Procedure: TOTAL KNEE ARTHROPLASTY;  Surgeon: Renette Butters, MD;  Location: [facility];  Service: Orthopedics;  Laterality: Left;    Family History  Problem Relation Age of Onset  . Cancer Mother 35       unk. primary; deceased 5  . Cancer Father 33       brain cancer; deceased 72  . Ovarian cancer Sister 100       deceased 72  . Diabetes Brother   . Breast cancer Maternal Grandmother        dx 12s;  deceased 57  . Cancer Other        "female ca"  . Diabetes Sister   . Diabetes Sister   . Diabetes Sister   . Cancer Paternal Uncle        brain cancer   . Cancer Paternal Grandmother        "female ca"  . Cancer Paternal Grandfather        lung cancer  . Cancer Paternal Uncle        brain tumor   . Cancer Paternal Uncle        brain tumor   . Stomach cancer Maternal Aunt 87       deceased 58  . Cancer Paternal Aunt        ink. primary in 2 aunts  . Melanoma Other   . Lung cancer Maternal Aunt   . Stomach cancer Cousin        2 sons of an unaffected maternal uncle    Social History   Tobacco Use  . Smoking status: Never Smoker  . Smokeless tobacco: Never Used  Vaping Use  . Vaping Use: Never used  Substance Use Topics  . Alcohol use: No  . Drug use: Never    Medications: I have reviewed the patient's current medications. Allergies as of 02/16/2020      Reactions   Onion Other (See Comments)   REACTION: results in coma  state   Penicillins Hives, Shortness Of Breath, Swelling   Throat swelling Has patient had a PCN reaction causing immediate rash, facial/tongue/throat swelling, SOB or lightheadedness with hypotension: Yes Has patient had a PCN reaction causing severe rash involving mucus membranes or skin necrosis: No Has patient had a PCN reaction that required hospitalization: Yes Has patient had a PCN reaction occurring within the last 10 years: No If all of the above answers are "NO", then may proceed with Cephalosporin use.   Sulfa Antibiotics Hives, Shortness Of Breath   Yellow Dye    REACTION: #5 and #90 "will kill me"   Tape Swelling   Pt states she had tape and Tegaderm used on her in [place]. This caused swelling to her face and neck. Wounds and bleeding from area tape/tegaderm was used.    Clindamycin/lincomycin Hives, Nausea And Vomiting   Lipitor [atorvastatin Calcium] Hives      Medication List       Accurate as of February 16, 2020 11:59 PM. If you have any questions, ask your nurse or doctor.        albuterol (2.5 MG/3ML) 0.083% nebulizer solution Commonly known as: PROVENTIL Take 3 mLs (2.5 mg total) by nebulization every 6 (six) hours as needed for wheezing or shortness of breath.   albuterol 108 (90 Base) MCG/ACT inhaler Commonly known as: VENTOLIN HFA INHALE 2 PUFFS BY MOUTH EVERY 6 HOURS AS NEEDED FOR WHEEZING OR SHORTNESS OF BREATH   ALPRAZolam 1 MG tablet Commonly known as: XANAX Take 1 tablet by mouth twice daily as needed for anxiety   baclofen 10 MG tablet Commonly known as: LIORESAL Take 1 tablet by mouth three times daily as needed for muscle spasm   Flovent HFA 110 MCG/ACT inhaler Generic drug: fluticasone Inhale 1 puff into the lungs in the morning and at bedtime.   gabapentin 400 MG capsule Commonly known as: NEURONTIN Take 1 capsule (400 mg total) by mouth 3 (three) times daily.   ibuprofen 800 MG tablet Commonly known as: ADVIL TAKE 1 TABLET BY MOUTH  EVERY  8 HOURS AS NEEDED   Lastacaft 0.25 % Soln Generic drug: Alcaftadine Place 1 drop into both eyes every other day.   omeprazole 20 MG capsule Commonly known as: PriLOSEC Take 1 capsule (20 mg total) by mouth daily. 30 days for gastroprotection while taking NSAIDs.   ondansetron 4 MG tablet Commonly known as: Zofran Take 1 tablet (4 mg total) by mouth every 8 (eight) hours as needed for nausea or vomiting.   predniSONE 20 MG tablet Commonly known as: DELTASONE Take 2 tablets (40 mg total) by mouth daily with breakfast.   simvastatin 20 MG tablet Commonly known as: ZOCOR Take 1 tablet (20 mg total) by mouth 3 (three) times a week. What changed: when to take this   SUMAtriptan 100 MG tablet Commonly known as: IMITREX TAKE 1 TABLET BY MOUTH AT ONSET OF MIGRAINE, IF SYMPTOMS PERSIST 2 HOURS LATER THEN TAKE AN ADDITIONAL DOSE--MAX OF 2 TABLETS PER 24 HOURS--   zolpidem 10 MG tablet Commonly known as: AMBIEN TAKE 1 TABLET BY MOUTH AT BEDTIME AS NEEDED FOR SLEEP        ROS:  A comprehensive review of systems was negative except for: Ears, nose, mouth, throat, and face: positive for right neck tenderness and knot multiple skin lesions  Blood pressure (!) 158/80, pulse 84, temperature 98.3 F (36.8 C), temperature source Oral, resp. rate 18, height 5\' 8"  (1.727 m), weight 205 lb (93 kg), SpO2 93 %. Physical Exam Vitals reviewed.  Constitutional:      Appearance: She is normal weight.  HENT:     Head: Normocephalic and atraumatic.     Nose: Nose normal.     Mouth/Throat:     Mouth: Mucous membranes are moist.  Eyes:     Extraocular Movements: Extraocular movements intact.     Pupils: Pupils are equal, round, and reactive to light.  Cardiovascular:     Rate and Rhythm: Normal rate and regular rhythm.  Pulmonary:     Effort: Pulmonary effort is normal.     Breath sounds: Normal breath sounds.  Chest:     Comments: Left lateral breast incision healed, no masses noted,  no obvious axillary adenopathy on left, small skin indentation/ thickening on the upper left breast at about 1 oclock, no masses  Abdominal:     General: There is no distension.     Palpations: Abdomen is soft.     Tenderness: There is no abdominal tenderness.  Musculoskeletal:        General: No swelling. Normal range of motion.     Cervical back: Normal range of motion. No rigidity.  Lymphadenopathy:     Head:     Right side of head: Submandibular adenopathy present. No submental adenopathy.     Left side of head: No submental or submandibular adenopathy.     Cervical: No cervical adenopathy.     Upper Body:     Right upper body: No supraclavicular adenopathy.     Left upper body: No supraclavicular adenopathy.     Comments: Right submandibular area tender, palpable 2+ cm node, [place] at bedside with some irregular borders  Skin:    General: Skin is warm and dry.     Comments: 1.) Left upper arm, under the arm small flesh colored skin tag 2.) Between breast, small flesh colored skin tag  3.) Left neck, 1cm cyst like area superficial with central pit, consistent with sebaceous cyst 4.) Large 2.5cm upper central back, cyst with head and papilla central, no  drainage, tender, swelling tender 5.) Left neck flesh colored appearing pedunculated mole   Neurological:     General: No focal deficit present.     Mental Status: She is alert and oriented to person, place, and time.  Psychiatric:        Mood and Affect: Mood normal.        Behavior: Behavior normal.        Thought Content: Thought content normal.        Judgment: Judgment normal.     Results: CLINICAL DATA:  Previous left lumpectomy. Pain at the lumpectomy site.  EXAM: DIGITAL DIAGNOSTIC BILATERAL MAMMOGRAM WITH CAD AND TOMO  ULTRASOUND LEFT BREAST  COMPARISON:  Previous exam(s).  ACR Breast Density Category b: There are scattered areas of fibroglandular density.  FINDINGS: The left lumpectomy site is stable. No  suspicious masses, calcifications, or distortion are seen in either breast.  Mammographic images were processed with CAD.  On physical exam, no suspicious lumps are identified.  Targeted ultrasound is performed, showing normal lumpectomy changes. No evidence of malignancy.  IMPRESSION: No mammographic or sonographic evidence of malignancy. Stable left lumpectomy site.  RECOMMENDATION: Annual diagnostic mammography. Treatment of the patient's symptoms should be based on clinical and physical exam given lack of imaging findings.  I have discussed the findings and recommendations with the patient. If applicable, a reminder letter will be sent to the patient regarding the next appointment.  BI-RADS CATEGORY  2: Benign.   Electronically Signed   By: Dorise Bullion III M.D   On: 12/02/2019 15:07   Assessment & Plan:  Mary Wise is a 69 y.o. female with concern for adenopathy on the right neck that is adjacent to a mole but not related. She is tender in this area and has had some sinusitis and left sided ear infections but reports this area being present for a year+ and being tender.  This could all be reactive but she has a history of breast cancer. She has been stable on her mammogram.  I told her this is separate and not related to the mole in the area.    -US soft tissue to evaluate the adenopathy further, may need additional work up  -Various skin lesion above aside from the large back sebaceous cyst can be removed in the office under local. The large back sebaceous cyst will likely need some sedation due to the size and depth  -Will decide about excision after this [place] is completed  -Will call with results   All questions were answered to the satisfaction of the patient and family.   Virl Cagey 02/17/2020, 10:56 AM

## 2020-02-16 NOTE — Patient Instructions (Addendum)
Will get [place] to look at the right lymph node. Will get you on the schedule to remove the superficial skin lesions in the office in the upcoming weeks. Will plan for OR for the back sebaceous cyst.   Lymphadenopathy  Lymphadenopathy means that your lymph glands are swollen or larger than normal (enlarged). Lymph glands, also called lymph nodes, are collections of tissue that filter bacteria, viruses, and waste from your bloodstream. They are part of your body's disease-fighting system (immune system), which protects your body from germs. There may be different causes of lymphadenopathy, depending on where it is in your body. Some types go away on their own. Lymphadenopathy can occur anywhere that you have lymph glands, including these areas:  Neck (cervical lymphadenopathy).  Chest (mediastinal lymphadenopathy).  Lungs (hilar lymphadenopathy).  Underarms (axillary lymphadenopathy).  Groin (inguinal lymphadenopathy). When your immune system responds to germs, infection-fighting cells and fluid build up in your lymph glands. This causes some swelling and enlargement. If the lymph glands do not go back to normal after you have an infection or disease, your health care provider may do tests. These tests help to monitor your condition and find the reason why the glands are still swollen and enlarged. Follow these instructions at home:  Get plenty of rest.  Take over-the-counter and prescription medicines only as told by your health care provider. Your health care provider may recommend over-the-counter medicines for pain.  If directed, apply heat to swollen lymph glands as often as told by your health care provider. Use the heat source that your health care provider recommends, such as a moist heat pack or a heating pad. ? Place a towel between your skin and the heat source. ? Leave the heat on for 20-30 minutes. ? Remove the heat if your skin turns bright red. This is especially important if  you are unable to feel pain, heat, or cold. You may have a greater risk of getting burned.  Check your affected lymph glands every day for changes. Check other lymph gland areas as told by your health care provider. Check for changes such as: ? More swelling. ? Sudden increase in size. ? Redness or pain. ? Hardness.  Keep all follow-up visits as told by your health care provider. This is important. Contact a health care provider if you have:  Swelling that gets worse or spreads to other areas.  Problems with breathing.  Lymph glands that: ? Are still swollen after 2 weeks. ? Have suddenly gotten bigger. ? Are red, painful, or hard.  A fever or chills.  Fatigue.  A sore throat.  Pain in your abdomen.  Weight loss.  Night sweats. Get help right away if you have:  Fluid leaking from an enlarged lymph gland.  Severe pain.  Chest pain.  Shortness of breath. Summary  Lymphadenopathy means that your lymph glands are swollen or larger than normal (enlarged).  Lymph glands (also called lymph nodes) are collections of tissue that filter bacteria, viruses, and waste from the bloodstream. They are part of your body's disease-fighting system (immune system).  Lymphadenopathy can occur anywhere that you have lymph glands.  If your enlarged and swollen lymph glands do not go back to normal after you have an infection or disease, your health care provider may do tests to monitor your condition and find the reason why the glands are still swollen and enlarged.  Check your affected lymph glands every day for changes. Check other lymph gland areas as told by your  health care provider. This information is not intended to replace advice given to you by your health care provider. Make sure you discuss any questions you have with your health care provider. Document Revised: 07/03/2017 Document Reviewed: 06/05/2017 Elsevier Patient Education  2020 [place].  Epidermal/ Sebaceous  Cyst  An epidermal cyst is a small, painless lump under your skin. The cyst contains a grayish-white, bad-smelling substance (keratin). Do not try to pop or open an epidermal cyst yourself. What are the causes?  A blocked hair follicle.  A hair that curls and re-enters the skin instead of growing straight out of the skin.  A blocked pore.  Irritated skin.  An injury to the skin.  Certain conditions that are passed along from parent to child (inherited).  Human papillomavirus (HPV).  Long-term sun damage to the skin. What increases the risk?  Having acne.  Being overweight.  Being 68-54 years old. What are the signs or symptoms? These cysts are usually harmless, but they can get infected. Symptoms of infection may include:  Redness.  Inflammation.  Tenderness.  Warmth.  Fever.  A grayish-white, bad-smelling substance drains from the cyst.  Pus drains from the cyst. How is this treated? In many cases, epidermal cysts go away on their own without treatment. If a cyst becomes infected, treatment may include:  Opening and draining the cyst, done by a doctor. After draining, you may need minor surgery to remove the rest of the cyst.  Antibiotic medicine.  Shots of medicines (steroids) that help to reduce inflammation.  Surgery to remove the cyst. Surgery may be done if the cyst: ? Becomes large. ? Bothers you. ? Has a chance of turning into cancer.  Do not try to open a cyst yourself. Follow these instructions at home:  Take over-the-counter and prescription medicines only as told by your doctor.  If you were prescribed an antibiotic medicine, take it it as told by your doctor. Do not stop using the antibiotic even if you start to feel better.  Keep the area around your cyst clean and dry.  Wear loose, dry clothing.  Avoid touching your cyst.  Check your cyst every day for signs of infection. Check for: ? Redness, swelling, or pain. ? Fluid or  blood. ? Warmth. ? Pus or a bad smell.  Keep all follow-up visits as told by your doctor. This is important. How is this prevented?  Wear clean, dry, clothing.  Avoid wearing tight clothing.  Keep your skin clean and dry. Take showers or baths every day. Contact a doctor if:  Your cyst has symptoms of infection.  Your condition does not improve or gets worse.  You have a cyst that looks different from other cysts you have had.  You have a fever. Get help right away if:  Redness spreads from the cyst into the area close by. Summary  An epidermal cyst is a sac made of skin tissue.  If a cyst becomes infected, treatment may include surgery to open and drain the cyst, or to remove it.  Take over-the-counter and prescription medicines only as told by your doctor.  Contact a doctor if your condition is not improving or is getting worse.  Keep all follow-up visits as told by your doctor. This is important. This information is not intended to replace advice given to you by your health care provider. Make sure you discuss any questions you have with your health care provider. Document Revised: 11/11/2018 Document Reviewed: 04/29/2018 Elsevier Patient  Education  2020 [employer]  

## 2020-02-17 ENCOUNTER — Other Ambulatory Visit: Payer: Self-pay | Admitting: Family Medicine

## 2020-02-23 ENCOUNTER — Other Ambulatory Visit: Payer: Self-pay

## 2020-02-23 ENCOUNTER — Ambulatory Visit (HOSPITAL_COMMUNITY)
Admission: RE | Admit: 2020-02-23 | Discharge: 2020-02-23 | Disposition: A | Discharge status: Home / Self Care | Payer: Medicare Other | Source: Ambulatory Visit | Attending: General Surgery | Admitting: General Surgery

## 2020-02-23 DIAGNOSIS — R591 Generalized enlarged lymph nodes: Secondary | ICD-10-CM | POA: Diagnosis not present

## 2020-02-23 DIAGNOSIS — R590 Localized enlarged lymph nodes: Secondary | ICD-10-CM | POA: Diagnosis not present

## 2020-02-23 DIAGNOSIS — R599 Enlarged lymph nodes, unspecified: Secondary | ICD-10-CM

## 2020-02-29 ENCOUNTER — Telehealth: Payer: Self-pay | Admitting: General Surgery

## 2020-02-29 DIAGNOSIS — L723 Sebaceous cyst: Secondary | ICD-10-CM

## 2020-02-29 DIAGNOSIS — L918 Other hypertrophic disorders of the skin: Secondary | ICD-10-CM

## 2020-02-29 NOTE — Telephone Encounter (Signed)
[facility]  CLINICAL DATA:  Right submandibular lymphadenopathy.  EXAM: ULTRASOUND OF HEAD/NECK SOFT TISSUES  TECHNIQUE: Ultrasound examination of the head and neck soft tissues was performed in the area of clinical concern.  COMPARISON:  None.  FINDINGS: The patient's palpable area of concern appears to correspond to the right submandibular gland. In this region, there are multiple small submandibular lymph nodes measuring up to approximately 0.5 cm in the short axis. These lymph nodes demonstrate a normal ovoid shape and fatty hilum and are favored to be reactive.  IMPRESSION: No worrisome sonographic abnormality detected. The patient's palpable area of concern corresponds to the submandibular gland and multiple, morphologically normal lymph nodes for which no further follow-up is recommended in the absence of interval growth.   Electronically Signed   By: Constance Holster M.D.   On: 02/23/2020 18:57  Patient wants to proceed with excision of as many of her lesions as possible. Will see back 03/06/2020 @ 9:15AM and get her scheduled for surgery.  Curlene Labrum, MD [facility] [address] [place], [place] [address] [phone] (office)

## 2020-03-06 ENCOUNTER — Other Ambulatory Visit: Payer: Self-pay

## 2020-03-06 ENCOUNTER — Ambulatory Visit (INDEPENDENT_AMBULATORY_CARE_PROVIDER_SITE_OTHER): Payer: Medicare Other | Admitting: General Surgery

## 2020-03-06 ENCOUNTER — Encounter: Payer: Self-pay | Admitting: General Surgery

## 2020-03-06 VITALS — BP 170/100 | HR 90 | Temp 97.8°F | Resp 14 | Ht 68.0 in | Wt 206.0 lb

## 2020-03-06 DIAGNOSIS — D224 Melanocytic nevi of scalp and neck: Secondary | ICD-10-CM | POA: Diagnosis not present

## 2020-03-06 DIAGNOSIS — L918 Other hypertrophic disorders of the skin: Secondary | ICD-10-CM

## 2020-03-06 DIAGNOSIS — L723 Sebaceous cyst: Secondary | ICD-10-CM | POA: Diagnosis not present

## 2020-03-06 NOTE — Patient Instructions (Signed)
Excision of Skin Lesions Excision of a skin lesion is the removal of a section of skin by making small cuts (incisions) in the skin. Through this process, the lesion is completely removed. This procedure is often done to treat or prevent cancer or infection. It may also be done to improve cosmetic appearance. The procedure may be done to remove:  Cancerous (malignant) growths, such as basal cell carcinoma, squamous cell carcinoma, or melanoma.  Noncancerous (benign) growths, such as a cyst or lipoma.  Growths, such as moles or skin tags, which may be removed for cosmetic reasons. Various excision or surgical techniques may be used depending on your condition, the location of the lesion, and your overall health. Tell a health care provider about:  Any allergies you have.  All medicines you are taking, including vitamins, herbs, eye drops, creams, and over-the-counter medicines.  Any problems you or family members have had with anesthetic medicines.  Any blood disorders you have.  Any surgeries you have had.  Any medical conditions you have or have had.  Whether you are pregnant or may be pregnant. What are the risks? Generally, this is a safe procedure. However, problems may occur, including:  Bleeding.  Infection.  Scarring.  Recurrence of the cyst, lipoma, or cancer.  Changes in skin sensation or appearance, such as discoloration or swelling.  Reaction to the anesthetics.  Allergic reaction to surgical materials or ointments.  Damage to nerves, blood vessels, muscles, or other structures.  Continued pain. What happens before the procedure? Medicines Ask your health care provider about:  Changing or stopping your regular medicines. This is especially important if you are taking diabetes medicines or blood thinners.  Taking medicines such as aspirin and ibuprofen. These medicines can thin your blood. Do not take these medicines unless your health care provider tells  you to take them.  Taking over-the-counter medicines, vitamins, herbs, and supplements. General instructions  You may be asked to stop smoking.  You may have an exam or testing.  Ask your health care provider what steps will be taken to help prevent infection. These may include: ? Removing hair at the surgery site. ? Washing skin with a germ-killing soap. ? Taking antibiotic medicine. What happens during the procedure?   You will be given a medicine to numb the area (local anesthetic).  Your health care provider will remove the lesions using one of the following excision techniques. ? Complete surgical excision. This procedure may be done to treat a cancerous growth or a noncancerous cyst or lesion.  A small scalpel or scissors will be used to gently cut around and under the lesion until it is completely removed.  If bleeding occurs, it will be stopped with a device that delivers heat (electrocautery).  The edges of the wound may be stitched (sutured) together.  A bandage (dressing) will be applied.  Samples will be sent to a lab for testing. ? Excision of a cyst.  An incision will be made on the cyst.  The entire cyst will be removed through the incision.  The incision may be closed with sutures. ? Shave excision. This may be done to remove a mole or a skin tag.  A small blade or an electrically heated loop instrument will be used to shave off the lesion.  The wound is usually left to heal on its own without sutures. ? Punch excision. This may be done to remove a mole or a scar or to do a biopsy of the lesion.  A small tool that is like a cookie cutter or a hole punch is used to cut a circle shape out of the skin.  The outer edges of the skin will be sutured together.  The sample may be sent to a lab for testing. ? Mohs micrographic surgery. This is usually done to treat skin cancer. This type of excision is mostly used on the face and ears. This procedure is  minimally invasive, and it ensures the best cosmetic outcome.  A scalpel or a loop instrument will be used to remove layers of the lesion until all the abnormal or cancerous tissue has been removed.  The wound may be sutured, depending on its size.  The tissue will be checked under a microscope right away. Each of the techniques may vary among health care providers and hospitals. At the end of any of these procedures, antibiotic ointment will be applied as needed. What happens after the procedure?  Return to your normal activities as told by your health care provider. Ask your health care provider what activities are safe for you.  It is up to you to get the results of your procedure. Ask your doctor, or the department that is doing the procedure, when your results will be ready.  Talk with your health care provider to discuss any test results, treatment options, and if necessary, the need for more tests.  Keep all follow-up visits as told by your health care provider. This is important. Summary  Excision of a skin lesion is the removal of a section of skin by making small cuts (incisions) in the skin. This procedure is often done to treat or prevent cancer and infection, or it may be done to improve cosmetic appearance.  Various excision or surgical techniques may be used depending on your condition, the location of the lesion, and your overall health.  After the procedure, talk with your health care provider to discuss any test results, treatment options, and if necessary, the need for more tests.  Keep all follow-up visits as told by your health care provider. This is important. This information is not intended to replace advice given to you by your health care provider. Make sure you discuss any questions you have with your health care provider. Document Revised: 01/27/2018 Document Reviewed: 01/27/2018 Elsevier Patient Education  2020 Elsevier Inc[employer]   

## 2020-03-07 ENCOUNTER — Telehealth: Payer: Self-pay | Admitting: Family Medicine

## 2020-03-07 NOTE — Progress Notes (Signed)
  Chronic Care Management   Outreach Note  03/07/2020 Name: Mary Wise MRN: [id number] DOB: [birth year]  Referred by: Alycia Rossetti, MD Reason for referral : No chief complaint on file.   An unsuccessful telephone outreach was attempted today. The patient was referred to the pharmacist for assistance with care management and care coordination.   Follow Up Plan:   Carley Perdue [employer] Scheduler

## 2020-03-09 ENCOUNTER — Other Ambulatory Visit: Payer: Self-pay | Admitting: Family Medicine

## 2020-03-09 NOTE — H&P (Signed)
[facility] History and Physical   Chief Complaint    Discuss lesion removal      Mary Wise is a 69 y.o. female.  HPI: Mary Wise is known to me and came in with concern for multiple cysts and skin lesions that she wants removed. When she saw me a few weeks ago she also had adenopathy of the right neck she was reporting had been there for a while and not improving. We did get an [place] to evaluate this given her history of breast cancer and it was benign. She returns to discuss the lesions we will be removing and the plan as she wants sedation for these things to be done.  Otherwise she has no changes or concerns.   Past Medical History:  Diagnosis Date  . Anxiety   . Asthma   . Cancer [facility])    left breast cancer  . Family history of cancer   . Genetic testing 02/01/2018   Multi-Cancer panel (83 genes) @ [facility] - No pathogenic mutations detected  . Hemorrhoids   . History of radiation therapy 05/27/18- 06/23/18   Left Breast 40.05 Gy in 15 fractions, left breast boost 10 Gy in 5 fractions.   . Lipoma of left shoulder   . Migraine   . Migraine   . Osteopenia   . 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 ABDOMINAL HYSTERECTOMY    . APPENDECTOMY    . CESAREAN SECTION    . CHOLECYSTECTOMY    . ERCP    . IR FLUORO GUIDE PORT INSERTION RIGHT  10/05/2017  . IR REMOVAL TUN ACCESS W/ PORT W/O FL MOD SED  05/13/2018  . IR US GUIDE VASC ACCESS RIGHT  10/05/2017  . LIPOMA EXCISION Left 03/16/2018   left shoulder  . LIVER SURGERY    . PARTIAL MASTECTOMY WITH AXILLARY SENTINEL LYMPH NODE BIOPSY Left 07/31/2017   Procedure: LEFT PARTIAL MASTECTOMY WITH AXILLARY SENTINEL LYMPH NODE BIOPSY;  Surgeon: Virl Cagey, MD;  Location: [facility];  Service: General;  Laterality: Left;  . TOTAL KNEE ARTHROPLASTY Left 07/05/2019   Procedure: TOTAL KNEE ARTHROPLASTY;  Surgeon: Renette Butters, MD;  Location: [facility];  Service: Orthopedics;  Laterality: Left;    Family History  Problem Relation Age of Onset  . Cancer Mother 88       unk. primary; deceased 38  . Cancer Father 29       brain cancer; deceased 46  . Ovarian cancer Sister 37       deceased 50  . Diabetes Brother   . Breast cancer Maternal Grandmother        dx 8s; deceased 89  . Cancer Other        "female ca"  . Diabetes Sister   . Diabetes Sister   . Diabetes Sister   . Cancer Paternal Uncle        brain cancer   . Cancer Paternal Grandmother        "female ca"  . Cancer Paternal Grandfather        lung cancer  . Cancer Paternal Uncle        brain tumor   . Cancer Paternal Uncle        brain tumor   . Stomach cancer Maternal Aunt 87       deceased 68  . Cancer Paternal Aunt  ink. primary in 2 aunts  . Melanoma Other   . Lung cancer Maternal Aunt   . Stomach cancer Cousin        2 sons of an unaffected maternal uncle    Social History   Tobacco Use  . Smoking status: Never Smoker  . Smokeless tobacco: Never Used  Vaping Use  . Vaping Use: Never used  Substance Use Topics  . Alcohol use: No  . Drug use: Never    Medications: I have reviewed the patient's current medications. Allergies as of 03/06/2020      Reactions   Onion Other (See Comments)   REACTION: results in coma state   Penicillins Hives, Shortness Of Breath, Swelling   Throat swelling Has patient had a PCN reaction causing immediate rash, facial/tongue/throat swelling, SOB or lightheadedness with hypotension: Yes Has patient had a PCN reaction causing severe rash involving mucus membranes or skin necrosis: No Has patient had a PCN reaction that required hospitalization: Yes Has patient had a PCN reaction occurring within the last 10 years: No If all of the above answers are "NO", then may proceed with Cephalosporin use.   Sulfa Antibiotics Hives, Shortness Of Breath   Yellow Dye    REACTION: #5 and #90 "will kill me"   Tape Swelling    Pt states she had tape and Tegaderm used on her in [place]. This caused swelling to her face and neck. Wounds and bleeding from area tape/tegaderm was used.    Clindamycin/lincomycin Hives, Nausea And Vomiting   Lipitor [atorvastatin Calcium] Hives      Medication List       Accurate as of March 06, 2020 11:59 PM. If you have any questions, ask your nurse or doctor.        albuterol (2.5 MG/3ML) 0.083% nebulizer solution Commonly known as: PROVENTIL Take 3 mLs (2.5 mg total) by nebulization every 6 (six) hours as needed for wheezing or shortness of breath.   albuterol 108 (90 Base) MCG/ACT inhaler Commonly known as: VENTOLIN HFA INHALE 2 PUFFS BY MOUTH EVERY 6 HOURS AS NEEDED FOR WHEEZING OR SHORTNESS OF BREATH   ALPRAZolam 1 MG tablet Commonly known as: XANAX Take 1 tablet by mouth twice daily as needed for anxiety   baclofen 10 MG tablet Commonly known as: LIORESAL Take 1 tablet by mouth three times daily as needed for muscle spasm   Flovent HFA 110 MCG/ACT inhaler Generic drug: fluticasone Inhale 1 puff into the lungs in the morning and at bedtime.   gabapentin 400 MG capsule Commonly known as: NEURONTIN Take 1 capsule (400 mg total) by mouth 3 (three) times daily.   ibuprofen 800 MG tablet Commonly known as: ADVIL TAKE 1 TABLET BY MOUTH EVERY 8 HOURS AS NEEDED.   Lastacaft 0.25 % Soln Generic drug: Alcaftadine Place 1 drop into both eyes every other day.   omeprazole 20 MG capsule Commonly known as: PriLOSEC Take 1 capsule (20 mg total) by mouth daily. 30 days for gastroprotection while taking NSAIDs.   ondansetron 4 MG tablet Commonly known as: Zofran Take 1 tablet (4 mg total) by mouth every 8 (eight) hours as needed for nausea or vomiting.   predniSONE 20 MG tablet Commonly known as: DELTASONE Take 2 tablets (40 mg total) by mouth daily with breakfast.   simvastatin 20 MG tablet Commonly known as: ZOCOR TAKE 1 TABLET BY MOUTH THREE TIMES A  WEEK   SUMAtriptan 100 MG tablet Commonly known as: IMITREX TAKE 1 TABLET BY  MOUTH AT ONSET OF MIGRAINE, IF SYMPTOMS PERSIST 2 HOURS LATER THEN TAKE AN ADDITIONAL DOSE--MAX OF 2 TABLETS PER 24 HOURS--   zolpidem 10 MG tablet Commonly known as: AMBIEN TAKE 1 TABLET BY MOUTH AT BEDTIME AS NEEDED FOR SLEEP        ROS:  A comprehensive review of systems was negative except for: skin irritation at the right neck mole, irritation under left arm due to skin tag, midback cyst   Blood pressure (!) 170/100, pulse 90, temperature 97.8 F (36.6 C), temperature source Oral, resp. rate 14, height 5\' 8"  (1.727 m), weight 206 lb (93.4 kg), SpO2 95 %. Physical Exam Vitals reviewed.  Constitutional:      Appearance: She is normal weight.  HENT:     Head: Normocephalic.     Nose: Nose normal.     Mouth/Throat:     Mouth: Mucous membranes are moist.  Eyes:     Pupils: Pupils are equal, round, and reactive to light.  Cardiovascular:     Rate and Rhythm: Normal rate and regular rhythm.  Pulmonary:     Effort: Pulmonary effort is normal.     Breath sounds: Normal breath sounds.  Abdominal:     General: There is no distension.     Palpations: Abdomen is soft.     Tenderness: There is no abdominal tenderness.  Musculoskeletal:        General: No swelling. Normal range of motion.     Cervical back: Normal range of motion. No rigidity.  Skin:    General: Skin is warm.     Comments: 1.) Right neck mole / flesh colored; tender to touch 1cm 2.) Right shoulder/ neck small skin tag; catches on clothes < 1cm 3.) Left neck sebaceous cyst: painful 1cm 4.) Left under arm skin tag; catches on clothes < 1cm 5.) Between breast skin tag/ mole <1cm 6.) Upper midback sebaceous cyst with central pit and swelling; no erythema ~4cm  Neurological:     General: No focal deficit present.     Mental Status: She is alert and oriented to person, place, and time.  Psychiatric:        Mood and Affect: Mood normal.         Behavior: Behavior normal.        Thought Content: Thought content normal.        Judgment: Judgment normal.     Results: None   Assessment & Plan:  Mary Wise is a 69 y.o. female with multiple benign appearing skin lesions that do cause her some pain and discomfort. She has never had an infection from the back cyst but it looks very swollen. She reports the mole has been growing and changing on the right neck, and the left neck cyst is painful.   -Excision of the back cyst, left neck cyst, right neck mole and excision of the right shoulder and left underarm skin tags  -Will need sedation as the patient is nervous about the procedure  -Discussed preop COVID testing   All questions were answered to the satisfaction of the patient.     Virl Cagey 03/09/2020, 8:38 AM

## 2020-03-09 NOTE — Telephone Encounter (Signed)
Ok to refill 

## 2020-03-09 NOTE — Progress Notes (Signed)
[facility] History and Physical   Chief Complaint    Discuss lesion removal      Mary Wise is a 69 y.o. female.  HPI: Mary Wise is known to me and came in with concern for multiple cysts and skin lesions that she wants removed. When she saw me a few weeks ago she also had adenopathy of the right neck she was reporting had been there for a while and not improving. We did get an [place] to evaluate this given her history of breast cancer and it was benign. She returns to discuss the lesions we will be removing and the plan as she wants sedation for these things to be done.  Otherwise she has no changes or concerns.   Past Medical History:  Diagnosis Date  . Anxiety   . Asthma   . Cancer [facility])    left breast cancer  . Family history of cancer   . Genetic testing 02/01/2018   Multi-Cancer panel (83 genes) @ [facility] - No pathogenic mutations detected  . Hemorrhoids   . History of radiation therapy 05/27/18- 06/23/18   Left Breast 40.05 Gy in 15 fractions, left breast boost 10 Gy in 5 fractions.   . Lipoma of left shoulder   . Migraine   . Migraine   . Osteopenia   . 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 ABDOMINAL HYSTERECTOMY    . APPENDECTOMY    . CESAREAN SECTION    . CHOLECYSTECTOMY    . ERCP    . IR FLUORO GUIDE PORT INSERTION RIGHT  10/05/2017  . IR REMOVAL TUN ACCESS W/ PORT W/O FL MOD SED  05/13/2018  . IR US GUIDE VASC ACCESS RIGHT  10/05/2017  . LIPOMA EXCISION Left 03/16/2018   left shoulder  . LIVER SURGERY    . PARTIAL MASTECTOMY WITH AXILLARY SENTINEL LYMPH NODE BIOPSY Left 07/31/2017   Procedure: LEFT PARTIAL MASTECTOMY WITH AXILLARY SENTINEL LYMPH NODE BIOPSY;  Surgeon: Virl Cagey, MD;  Location: [facility];  Service: General;  Laterality: Left;  . TOTAL KNEE ARTHROPLASTY Left 07/05/2019   Procedure: TOTAL KNEE ARTHROPLASTY;  Surgeon: Renette Butters, MD;  Location: [facility];  Service: Orthopedics;  Laterality: Left;    Family History  Problem Relation Age of Onset  . Cancer Mother 69       unk. primary; deceased 22  . Cancer Father 85       brain cancer; deceased 61  . Ovarian cancer Sister 19       deceased 28  . Diabetes Brother   . Breast cancer Maternal Grandmother        dx 46s; deceased 46  . Cancer Other        "female ca"  . Diabetes Sister   . Diabetes Sister   . Diabetes Sister   . Cancer Paternal Uncle        brain cancer   . Cancer Paternal Grandmother        "female ca"  . Cancer Paternal Grandfather        lung cancer  . Cancer Paternal Uncle        brain tumor   . Cancer Paternal Uncle        brain tumor   . Stomach cancer Maternal Aunt 87       deceased 54  . Cancer Paternal Aunt  ink. primary in 2 aunts  . Melanoma Other   . Lung cancer Maternal Aunt   . Stomach cancer Cousin        2 sons of an unaffected maternal uncle    Social History   Tobacco Use  . Smoking status: Never Smoker  . Smokeless tobacco: Never Used  Vaping Use  . Vaping Use: Never used  Substance Use Topics  . Alcohol use: No  . Drug use: Never    Medications: I have reviewed the patient's current medications. Allergies as of 03/06/2020      Reactions   Onion Other (See Comments)   REACTION: results in coma state   Penicillins Hives, Shortness Of Breath, Swelling   Throat swelling Has patient had a PCN reaction causing immediate rash, facial/tongue/throat swelling, SOB or lightheadedness with hypotension: Yes Has patient had a PCN reaction causing severe rash involving mucus membranes or skin necrosis: No Has patient had a PCN reaction that required hospitalization: Yes Has patient had a PCN reaction occurring within the last 10 years: No If all of the above answers are "NO", then may proceed with Cephalosporin use.   Sulfa Antibiotics Hives, Shortness Of Breath   Yellow Dye    REACTION: #5 and #90 "will kill me"   Tape Swelling    Pt states she had tape and Tegaderm used on her in [place]. This caused swelling to her face and neck. Wounds and bleeding from area tape/tegaderm was used.    Clindamycin/lincomycin Hives, Nausea And Vomiting   Lipitor [atorvastatin Calcium] Hives      Medication List       Accurate as of March 06, 2020 11:59 PM. If you have any questions, ask your nurse or doctor.        albuterol (2.5 MG/3ML) 0.083% nebulizer solution Commonly known as: PROVENTIL Take 3 mLs (2.5 mg total) by nebulization every 6 (six) hours as needed for wheezing or shortness of breath.   albuterol 108 (90 Base) MCG/ACT inhaler Commonly known as: VENTOLIN HFA INHALE 2 PUFFS BY MOUTH EVERY 6 HOURS AS NEEDED FOR WHEEZING OR SHORTNESS OF BREATH   ALPRAZolam 1 MG tablet Commonly known as: XANAX Take 1 tablet by mouth twice daily as needed for anxiety   baclofen 10 MG tablet Commonly known as: LIORESAL Take 1 tablet by mouth three times daily as needed for muscle spasm   Flovent HFA 110 MCG/ACT inhaler Generic drug: fluticasone Inhale 1 puff into the lungs in the morning and at bedtime.   gabapentin 400 MG capsule Commonly known as: NEURONTIN Take 1 capsule (400 mg total) by mouth 3 (three) times daily.   ibuprofen 800 MG tablet Commonly known as: ADVIL TAKE 1 TABLET BY MOUTH EVERY 8 HOURS AS NEEDED.   Lastacaft 0.25 % Soln Generic drug: Alcaftadine Place 1 drop into both eyes every other day.   omeprazole 20 MG capsule Commonly known as: PriLOSEC Take 1 capsule (20 mg total) by mouth daily. 30 days for gastroprotection while taking NSAIDs.   ondansetron 4 MG tablet Commonly known as: Zofran Take 1 tablet (4 mg total) by mouth every 8 (eight) hours as needed for nausea or vomiting.   predniSONE 20 MG tablet Commonly known as: DELTASONE Take 2 tablets (40 mg total) by mouth daily with breakfast.   simvastatin 20 MG tablet Commonly known as: ZOCOR TAKE 1 TABLET BY MOUTH THREE TIMES A  WEEK   SUMAtriptan 100 MG tablet Commonly known as: IMITREX TAKE 1 TABLET BY  MOUTH AT ONSET OF MIGRAINE, IF SYMPTOMS PERSIST 2 HOURS LATER THEN TAKE AN ADDITIONAL DOSE--MAX OF 2 TABLETS PER 24 HOURS--   zolpidem 10 MG tablet Commonly known as: AMBIEN TAKE 1 TABLET BY MOUTH AT BEDTIME AS NEEDED FOR SLEEP        ROS:  A comprehensive review of systems was negative except for: skin irritation at the right neck mole, irritation under left arm due to skin tag, midback cyst   Blood pressure (!) 170/100, pulse 90, temperature 97.8 F (36.6 C), temperature source Oral, resp. rate 14, height 5\' 8"  (1.727 m), weight 206 lb (93.4 kg), SpO2 95 %. Physical Exam Vitals reviewed.  Constitutional:      Appearance: She is normal weight.  HENT:     Head: Normocephalic.     Nose: Nose normal.     Mouth/Throat:     Mouth: Mucous membranes are moist.  Eyes:     Pupils: Pupils are equal, round, and reactive to light.  Cardiovascular:     Rate and Rhythm: Normal rate and regular rhythm.  Pulmonary:     Effort: Pulmonary effort is normal.     Breath sounds: Normal breath sounds.  Abdominal:     General: There is no distension.     Palpations: Abdomen is soft.     Tenderness: There is no abdominal tenderness.  Musculoskeletal:        General: No swelling. Normal range of motion.     Cervical back: Normal range of motion. No rigidity.  Skin:    General: Skin is warm.     Comments: 1.) Right neck mole / flesh colored; tender to touch 1cm 2.) Right shoulder/ neck small skin tag; catches on clothes < 1cm 3.) Left neck sebaceous cyst: painful 1cm 4.) Left under arm skin tag; catches on clothes < 1cm 5.) Between breast skin tag/ mole <1cm 6.) Upper midback sebaceous cyst with central pit and swelling; no erythema ~4cm  Neurological:     General: No focal deficit present.     Mental Status: She is alert and oriented to person, place, and time.  Psychiatric:        Mood and Affect: Mood normal.         Behavior: Behavior normal.        Thought Content: Thought content normal.        Judgment: Judgment normal.     Results: None   Assessment & Plan:  DEMAYA HARDGE is a 69 y.o. female with multiple benign appearing skin lesions that do cause her some pain and discomfort. She has never had an infection from the back cyst but it looks very swollen. She reports the mole has been growing and changing on the right neck, and the left neck cyst is painful.   -Excision of the back cyst, left neck cyst, right neck mole and excision of the right shoulder and left underarm skin tags  -Will need sedation as the patient is nervous about the procedure  -Discussed preop COVID testing   All questions were answered to the satisfaction of the patient.     Virl Cagey 03/09/2020, 8:38 AM

## 2020-03-12 ENCOUNTER — Other Ambulatory Visit: Payer: Self-pay | Admitting: Family Medicine

## 2020-03-12 ENCOUNTER — Other Ambulatory Visit: Payer: Self-pay | Admitting: *Deleted

## 2020-03-12 DIAGNOSIS — C50412 Malignant neoplasm of upper-outer quadrant of left female breast: Secondary | ICD-10-CM

## 2020-03-12 MED ORDER — SUMATRIPTAN SUCCINATE 100 MG PO TABS: ORAL_TABLET | ORAL | 0 refills | Status: DC

## 2020-03-14 ENCOUNTER — Telehealth: Payer: Self-pay | Admitting: Family Medicine

## 2020-03-14 NOTE — Progress Notes (Signed)
  Chronic Care Management   Outreach Note  03/14/2020 Name: Mary Wise MRN: [id number] DOB: May 23, 1951  Referred by: Alycia Rossetti, MD Reason for referral : No chief complaint on file.   A second unsuccessful telephone outreach was attempted today. The patient was referred to pharmacist for assistance with care management and care coordination.  Follow Up Plan:   Carley Perdue [employer] Scheduler

## 2020-03-15 ENCOUNTER — Other Ambulatory Visit: Payer: Self-pay | Admitting: Family Medicine

## 2020-03-15 NOTE — Telephone Encounter (Signed)
Ok to refill 

## 2020-03-18 DIAGNOSIS — Z23 Encounter for immunization: Secondary | ICD-10-CM | POA: Diagnosis not present

## 2020-03-20 ENCOUNTER — Telehealth: Payer: Self-pay

## 2020-03-20 NOTE — Telephone Encounter (Signed)
Spoke with Dr,Jenkins- patient ok to have the vaccine booster.

## 2020-03-21 ENCOUNTER — Telehealth: Payer: Self-pay

## 2020-03-21 ENCOUNTER — Telehealth: Payer: Self-pay | Admitting: Family Medicine

## 2020-03-21 NOTE — Progress Notes (Signed)
°  Chronic Care Management   Outreach Note  03/21/2020 Name: Mary Wise MRN: [id number] DOB: [birth year]  Referred by: Alycia Rossetti, MD Reason for referral : No chief complaint on file.   Third unsuccessful telephone outreach was attempted today. The patient was referred to the pharmacist for assistance with care management and care coordination.   Follow Up Plan:   Carley Perdue [employer] Scheduler

## 2020-03-21 NOTE — Telephone Encounter (Signed)
A prescription for a mastectomy bra was faxed to [facility] at [phone]

## 2020-03-21 NOTE — Telephone Encounter (Signed)
Staff from [facility] called requesting rx for mastectomy bra.

## 2020-03-21 NOTE — Progress Notes (Signed)
°  Chronic Care Management   Note  03/21/2020 Name: Mary Wise MRN: [id number] DOB: Oct 05, 1950  Mary Wise is a 69 y.o. year old female who is a primary care patient of [place], Modena Nunnery, MD. I reached out to Aletta Edouard by phone today in response to a referral sent by Mary Wise's PCP, Buelah Manis, Modena Nunnery, MD.   Mary Wise was given information about Chronic Care Management services today including:  1. CCM service includes personalized support from designated clinical staff supervised by her physician, including individualized plan of care and coordination with other care providers 2. 24/7 contact phone numbers for assistance for urgent and routine care needs. 3. Service will only be billed when office clinical staff spend 20 minutes or more in a month to coordinate care. 4. Only one practitioner may furnish and bill the service in a calendar month. 5. The patient may stop CCM services at any time (effective at the end of the month) by phone call to the office staff.   Patient did not agree to enrollment in care management services and does not wish to consider at this time.  Follow up plan:   Carley Perdue [employer] Scheduler

## 2020-03-26 ENCOUNTER — Other Ambulatory Visit (HOSPITAL_COMMUNITY): Payer: Medicare Other

## 2020-03-26 ENCOUNTER — Other Ambulatory Visit: Payer: Self-pay | Admitting: Family Medicine

## 2020-03-28 ENCOUNTER — Encounter (HOSPITAL_COMMUNITY)
Admission: RE | Admit: 2020-03-28 | Discharge: 2020-03-28 | Disposition: A | Discharge status: Home / Self Care | Payer: Medicare Other | Source: Ambulatory Visit | Attending: General Surgery | Admitting: General Surgery

## 2020-03-28 ENCOUNTER — Other Ambulatory Visit: Payer: Self-pay

## 2020-03-28 ENCOUNTER — Other Ambulatory Visit (HOSPITAL_COMMUNITY)
Admission: RE | Admit: 2020-03-28 | Discharge: 2020-03-28 | Disposition: A | Discharge status: Home / Self Care | Payer: Medicare Other | Source: Ambulatory Visit | Attending: General Surgery | Admitting: General Surgery

## 2020-03-28 DIAGNOSIS — Z20822 Contact with and (suspected) exposure to covid-19: Secondary | ICD-10-CM | POA: Insufficient documentation

## 2020-03-28 DIAGNOSIS — Z01812 Encounter for preprocedural laboratory examination: Secondary | ICD-10-CM | POA: Diagnosis not present

## 2020-03-29 LAB — SARS CORONAVIRUS 2 (TAT 6-24 HRS): SARS Coronavirus 2: NEGATIVE

## 2020-03-30 ENCOUNTER — Encounter (HOSPITAL_COMMUNITY): Payer: Self-pay | Admitting: General Surgery

## 2020-03-30 ENCOUNTER — Ambulatory Visit (HOSPITAL_COMMUNITY): Payer: Medicare Other | Admitting: Anesthesiology

## 2020-03-30 ENCOUNTER — Ambulatory Visit (HOSPITAL_COMMUNITY)
Admission: RE | Admit: 2020-03-30 | Discharge: 2020-03-30 | Disposition: A | Discharge status: Home / Self Care | Payer: Medicare Other | Attending: General Surgery | Admitting: General Surgery

## 2020-03-30 ENCOUNTER — Other Ambulatory Visit: Payer: Self-pay

## 2020-03-30 ENCOUNTER — Encounter (HOSPITAL_COMMUNITY)
Admission: RE | Disposition: A | Discharge status: Home / Self Care | Payer: Self-pay | Source: Home / Self Care | Attending: General Surgery

## 2020-03-30 DIAGNOSIS — L989 Disorder of the skin and subcutaneous tissue, unspecified: Secondary | ICD-10-CM | POA: Diagnosis not present

## 2020-03-30 DIAGNOSIS — J45909 Unspecified asthma, uncomplicated: Secondary | ICD-10-CM | POA: Insufficient documentation

## 2020-03-30 DIAGNOSIS — Z79899 Other long term (current) drug therapy: Secondary | ICD-10-CM | POA: Diagnosis not present

## 2020-03-30 DIAGNOSIS — D224 Melanocytic nevi of scalp and neck: Secondary | ICD-10-CM | POA: Diagnosis not present

## 2020-03-30 DIAGNOSIS — D3611 Benign neoplasm of peripheral nerves and autonomic nervous system of face, head, and neck: Secondary | ICD-10-CM | POA: Insufficient documentation

## 2020-03-30 DIAGNOSIS — D367 Benign neoplasm of other specified sites: Secondary | ICD-10-CM | POA: Diagnosis not present

## 2020-03-30 DIAGNOSIS — G43909 Migraine, unspecified, not intractable, without status migrainosus: Secondary | ICD-10-CM | POA: Diagnosis not present

## 2020-03-30 DIAGNOSIS — Z853 Personal history of malignant neoplasm of breast: Secondary | ICD-10-CM | POA: Insufficient documentation

## 2020-03-30 DIAGNOSIS — Z923 Personal history of irradiation: Secondary | ICD-10-CM | POA: Diagnosis not present

## 2020-03-30 DIAGNOSIS — L918 Other hypertrophic disorders of the skin: Secondary | ICD-10-CM | POA: Diagnosis not present

## 2020-03-30 DIAGNOSIS — L723 Sebaceous cyst: Secondary | ICD-10-CM | POA: Diagnosis not present

## 2020-03-30 DIAGNOSIS — F410 Panic disorder [episodic paroxysmal anxiety] without agoraphobia: Secondary | ICD-10-CM | POA: Insufficient documentation

## 2020-03-30 DIAGNOSIS — Z7951 Long term (current) use of inhaled steroids: Secondary | ICD-10-CM | POA: Diagnosis not present

## 2020-03-30 DIAGNOSIS — Z7952 Long term (current) use of systemic steroids: Secondary | ICD-10-CM | POA: Diagnosis not present

## 2020-03-30 DIAGNOSIS — L720 Epidermal cyst: Secondary | ICD-10-CM | POA: Diagnosis not present

## 2020-03-30 DIAGNOSIS — I10 Essential (primary) hypertension: Secondary | ICD-10-CM | POA: Diagnosis not present

## 2020-03-30 DIAGNOSIS — L988 Other specified disorders of the skin and subcutaneous tissue: Secondary | ICD-10-CM | POA: Diagnosis not present

## 2020-03-30 DIAGNOSIS — Z96652 Presence of left artificial knee joint: Secondary | ICD-10-CM | POA: Insufficient documentation

## 2020-03-30 DIAGNOSIS — L821 Other seborrheic keratosis: Secondary | ICD-10-CM | POA: Insufficient documentation

## 2020-03-30 HISTORY — PX: MASS EXCISION: SHX2000

## 2020-03-30 SURGERY — EXCISION MASS
Anesthesia: General

## 2020-03-30 MED ORDER — BACITRACIN-NEOMYCIN-POLYMYXIN 400-5-5000 EX OINT
TOPICAL_OINTMENT | CUTANEOUS | Status: DC | PRN
  Administered 2020-03-30: 1 via TOPICAL

## 2020-03-30 MED ORDER — LIDOCAINE HCL (PF) 1 % IJ SOLN
INTRAMUSCULAR | Status: AC
  Filled 2020-03-30: qty 30

## 2020-03-30 MED ORDER — SUCCINYLCHOLINE CHLORIDE 200 MG/10ML IV SOSY
PREFILLED_SYRINGE | INTRAVENOUS | Status: AC
  Filled 2020-03-30: qty 10

## 2020-03-30 MED ORDER — PROPOFOL 500 MG/50ML IV EMUL
INTRAVENOUS | Status: DC | PRN
  Administered 2020-03-30: 100 ug/kg/min via INTRAVENOUS

## 2020-03-30 MED ORDER — FENTANYL CITRATE (PF) 100 MCG/2ML IJ SOLN
INTRAMUSCULAR | Status: AC
  Filled 2020-03-30: qty 2

## 2020-03-30 MED ORDER — BACITRACIN-NEOMYCIN-POLYMYXIN 400-5-5000 EX OINT
TOPICAL_OINTMENT | CUTANEOUS | Status: AC
  Filled 2020-03-30: qty 1

## 2020-03-30 MED ORDER — LACTATED RINGERS IV SOLN: INTRAVENOUS | Status: DC

## 2020-03-30 MED ORDER — DEXMEDETOMIDINE HCL IN NACL 200 MCG/50ML IV SOLN
INTRAVENOUS | Status: AC
  Filled 2020-03-30: qty 50

## 2020-03-30 MED ORDER — ONDANSETRON HCL 4 MG/2ML IJ SOLN: 4.0000 mg | Freq: Once | INTRAMUSCULAR | Status: DC | PRN

## 2020-03-30 MED ORDER — PROPOFOL 10 MG/ML IV BOLUS
INTRAVENOUS | Status: AC
  Filled 2020-03-30: qty 40

## 2020-03-30 MED ORDER — DEXMEDETOMIDINE HCL 200 MCG/2ML IV SOLN
INTRAVENOUS | Status: DC | PRN
  Administered 2020-03-30 (×5): 4 ug via INTRAVENOUS

## 2020-03-30 MED ORDER — MIDAZOLAM HCL 2 MG/2ML IJ SOLN
INTRAMUSCULAR | Status: AC
  Filled 2020-03-30: qty 2

## 2020-03-30 MED ORDER — CHLORHEXIDINE GLUCONATE CLOTH 2 % EX PADS: 6.0000 | MEDICATED_PAD | Freq: Once | CUTANEOUS | Status: DC

## 2020-03-30 MED ORDER — ORAL CARE MOUTH RINSE: 15.0000 mL | Freq: Once | OROMUCOSAL | Status: DC

## 2020-03-30 MED ORDER — MIDAZOLAM HCL 5 MG/5ML IJ SOLN
INTRAMUSCULAR | Status: DC | PRN
  Administered 2020-03-30: 2 mg via INTRAVENOUS

## 2020-03-30 MED ORDER — CHLORHEXIDINE GLUCONATE 0.12 % MT SOLN: 15.0000 mL | Freq: Once | OROMUCOSAL | Status: DC

## 2020-03-30 MED ORDER — LIDOCAINE HCL 1 % IJ SOLN
INTRAMUSCULAR | Status: DC | PRN
  Administered 2020-03-30: 21 mL

## 2020-03-30 MED ORDER — OXYCODONE HCL 5 MG PO TABS: 5.0000 mg | ORAL_TABLET | ORAL | 0 refills | Status: DC | PRN

## 2020-03-30 MED ORDER — VANCOMYCIN HCL IN DEXTROSE 1-5 GM/200ML-% IV SOLN
1000.0000 mg | INTRAVENOUS | Status: AC
  Administered 2020-03-30: 1000 mg via INTRAVENOUS
  Filled 2020-03-30: qty 200

## 2020-03-30 MED ORDER — FENTANYL CITRATE (PF) 100 MCG/2ML IJ SOLN
INTRAMUSCULAR | Status: DC | PRN
Start: 2020-03-30 — End: 2020-03-30
  Administered 2020-03-30 (×2): 50 ug via INTRAVENOUS

## 2020-03-30 MED ORDER — LIDOCAINE 2% (20 MG/ML) 5 ML SYRINGE
INTRAMUSCULAR | Status: DC | PRN
  Administered 2020-03-30: 40 mg via INTRAVENOUS
  Administered 2020-03-30: 60 mg via INTRAVENOUS

## 2020-03-30 MED ORDER — 0.9 % SODIUM CHLORIDE (POUR BTL) OPTIME
TOPICAL | Status: DC | PRN
  Administered 2020-03-30: 1000 mL

## 2020-03-30 MED ORDER — FENTANYL CITRATE (PF) 100 MCG/2ML IJ SOLN: 25.0000 ug | INTRAMUSCULAR | Status: DC | PRN

## 2020-03-30 SURGICAL SUPPLY — 47 items
ADH SKN CLS APL DERMABOND .7 (GAUZE/BANDAGES/DRESSINGS) ×3
APL PRP STRL LF ISPRP CHG 10.5 (MISCELLANEOUS) ×3
APPLICATOR CHLORAPREP 10.5 ORG (MISCELLANEOUS) ×4 IMPLANT
CLOTH BEACON ORANGE TIMEOUT ST (SAFETY) ×2 IMPLANT
COVER LIGHT HANDLE STERIS (MISCELLANEOUS) ×4 IMPLANT
COVER WAND RF STERILE (DRAPES) ×2 IMPLANT
DECANTER SPIKE VIAL GLASS SM (MISCELLANEOUS) ×2 IMPLANT
DERMABOND ADVANCED (GAUZE/BANDAGES/DRESSINGS) ×3
DERMABOND ADVANCED .7 DNX12 (GAUZE/BANDAGES/DRESSINGS) IMPLANT
DRAPE EENT ADH APERT 31X51 STR (DRAPES) IMPLANT
DRAPE HALF SHEET 40X57 (DRAPES) ×1 IMPLANT
DRSG TEGADERM 2-3/8X2-3/4 SM (GAUZE/BANDAGES/DRESSINGS) ×1 IMPLANT
DRSG TELFA 3X8 NADH (GAUZE/BANDAGES/DRESSINGS) ×2 IMPLANT
ELECT NDL TIP 2.8 STRL (NEEDLE) IMPLANT
ELECT NEEDLE TIP 2.8 STRL (NEEDLE) ×2 IMPLANT
ELECT REM PT RETURN 9FT ADLT (ELECTROSURGICAL) ×2
ELECTRODE REM PT RTRN 9FT ADLT (ELECTROSURGICAL) ×1 IMPLANT
GAUZE SPONGE 4X4 16PLY XRAY LF (GAUZE/BANDAGES/DRESSINGS) ×1 IMPLANT
GLOVE BIO SURGEON STRL SZ 6.5 (GLOVE) ×2 IMPLANT
GLOVE BIO SURGEON STRL SZ7 (GLOVE) ×3 IMPLANT
GLOVE BIOGEL PI IND STRL 6.5 (GLOVE) ×1 IMPLANT
GLOVE BIOGEL PI IND STRL 7.0 (GLOVE) ×1 IMPLANT
GLOVE BIOGEL PI IND STRL 7.5 (GLOVE) IMPLANT
GLOVE BIOGEL PI INDICATOR 6.5 (GLOVE) ×3
GLOVE BIOGEL PI INDICATOR 7.0 (GLOVE) ×1
GLOVE BIOGEL PI INDICATOR 7.5 (GLOVE) ×2
GLOVE ECLIPSE 6.5 STRL STRAW (GLOVE) ×1 IMPLANT
GLOVE ECLIPSE 7.0 STRL STRAW (GLOVE) ×1 IMPLANT
GLOVE SURG SS PI 7.5 STRL IVOR (GLOVE) ×1 IMPLANT
GOWN STRL REUS W/TWL LRG LVL3 (GOWN DISPOSABLE) ×6 IMPLANT
GOWN STRL REUS W/TWL XL LVL3 (GOWN DISPOSABLE) ×1 IMPLANT
KIT TURNOVER KIT A (KITS) ×2 IMPLANT
MANIFOLD NEPTUNE II (INSTRUMENTS) ×2 IMPLANT
NDL HYPO 25X1 1.5 SAFETY (NEEDLE) ×1 IMPLANT
NEEDLE HYPO 25X1 1.5 SAFETY (NEEDLE) ×4 IMPLANT
NS IRRIG 1000ML POUR BTL (IV SOLUTION) ×2 IMPLANT
PACK MINOR (CUSTOM PROCEDURE TRAY) ×1 IMPLANT
PAD ARMBOARD 7.5X6 YLW CONV (MISCELLANEOUS) ×2 IMPLANT
PAD DRESSING TELFA 3X8 NADH (GAUZE/BANDAGES/DRESSINGS) IMPLANT
SET BASIN LINEN APH (SET/KITS/TRAYS/PACK) ×2 IMPLANT
SPONGE GAUZE 2X2 8PLY STRL LF (GAUZE/BANDAGES/DRESSINGS) ×1 IMPLANT
SUT ETHILON 3 0 FSL (SUTURE) ×1 IMPLANT
SUT MNCRL AB 4-0 PS2 18 (SUTURE) ×3 IMPLANT
SUT PROLENE 4 0 PS 2 18 (SUTURE) IMPLANT
SUT VIC AB 3-0 SH 27 (SUTURE) ×2
SUT VIC AB 3-0 SH 27X BRD (SUTURE) IMPLANT
SYR CONTROL 10ML LL (SYRINGE) ×2 IMPLANT

## 2020-03-30 NOTE — Discharge Instructions (Signed)
Discharge Instructions:  Common Complaints: Pain at the incision site is common.  Some nausea is common and poor appetite after anesthesia. The main goal is to stay hydrated the first few days after surgery.   Diet/ Activity: Diet as tolerated. You may not have an appetite, but it is important to stay hydrated. Drink 64 ounces of water a day. Your appetite will return with time.  Shower per your regular routine daily.  Do not take hot showers. Take warm showers that are less than 10 minutes. Walk everyday for at least 15-20 minutes. Deep cough and move around every 1-2 hours in the first few days after surgery.  Limit excessive movement, lifting > 10 lbs, stretching with the limb if there is an incision on your arm/armpit or leg.   Limit stretching, pulling on your incision if it is located on other parts of your body.  Right/ Left Neck and Left Armpit: Do not pick at the dermabond glue on your incision sites. This glue film will remain in place for 1-2 weeks and will start to peel off.  Right neck skin tag area: Neosporin to the small area where it was burned off. Back Incision:  Do not pick at the sutures on your back. The back incision may drain and this is expected, change this dressing daily. Keep the area clean and dry with a dry bandage.  Do not place lotions or balms on your incision unless instructed to specifically by Dr. Constance Haw.   Medication: Take tylenol and ibuprofen as needed for pain control, alternating every 4-6 hours.  Example:  Tylenol 1000mg  @ 6am, 12noon, 6pm, 17midnight (Do not exceed 4000mg  of tylenol a day). Ibuprofen 800mg  @ 9am, 3pm, 9pm, 3am (Do not exceed 3600mg  of ibuprofen a day).  Take Roxicodone for breakthrough pain every 4 hours.  Take Colace for constipation related to narcotic pain medication. If you do not have a bowel movement in 2 days, take Miralax over the counter.  Drink plenty of water to also prevent constipation.   Contact Information: If you  have questions or concerns, please call our office, [phone], Monday- Thursday 8AM-5PM and Friday 8AM-12Noon.  If it is after hours or on the weekend, please call [facility] Main [facility], [phone], and ask to speak to the surgeon on call for Dr. Constance Haw at [facility].    Epidermal Cyst Removal, Care After This sheet gives you information about how to care for yourself after your procedure. Your health care provider may also give you more specific instructions. If you have problems or questions, contact your health care provider. What can I expect after the procedure? After the procedure, it is common to have:  Soreness in the area where your cyst was removed.  Tightness or itchiness from the stitches (sutures) in your skin. Follow these instructions at home: Medicines  Take over-the-counter and prescription medicines only as told by your health care provider.  If you were prescribed an antibiotic medicine or ointment, take or apply it as told by your health care provider. Do not stop using the antibiotic even if you start to feel better. Incision care   Follow instructions from your health care provider about how to take care of your incision. Make sure you: ? Wash your hands with soap and water before you change your bandage (dressing). If soap and water are not available, use hand sanitizer. ? Change your dressing as told by your health care provider. ? Leave sutures, skin glue, or adhesive strips in  place. These skin closures may need to stay in place for 1-2 weeks or longer. If adhesive strip edges start to loosen and curl up, you may trim the loose edges. Do not remove adhesive strips completely unless your health care provider tells you to do that.  Keep the dressingdry until your health care provider says that it can be removed.  After your dressing is off, check your incision area every day for signs of infection. Check for: ? Redness, swelling, or pain. ? Fluid or  blood. ? Warmth. ? Pus or a bad smell. General instructions  Do not take baths, swim, or use a hot tub until your health care provider approves. Ask your health care provider if you may take showers. You may only be allowed to take sponge baths.  Your health care provider may ask you to avoid contact sports or activities that take a lot of effort. Do not do anything that stretches or puts pressure on your incision.  You can return to your normal diet.  Keep all follow-up visits as told by your health care provider. This is important. Contact a health care provider if:  You have a fever.  You have redness, swelling, or pain in the incision area.  You have fluid or blood coming from your incision.  You have pus or a bad smell coming from your incision.  Your incision feels warm to the touch.  Your cyst grows back. Summary  After the procedure, it is common to have soreness in the area where your cyst was removed.  Take or apply over-the-counter and prescription medicines only as told by your health care provider.  Follow instructions from your health care provider about how to take care of your incision. This information is not intended to replace advice given to you by your health care provider. Make sure you discuss any questions you have with your health care provider. Document Revised: 11/10/2017 Document Reviewed: 05/14/2017 Elsevier Patient Education  2020 [place] After These instructions provide you with information about caring for yourself after your procedure. Your health care provider may also give you more specific instructions. Your treatment has been planned according to current medical practices, but problems sometimes occur. Call your health care provider if you have any problems or questions after your procedure. What can I expect after the procedure? After your procedure, you may:  Feel sleepy for several hours.  Feel  clumsy and have poor balance for several hours.  Feel forgetful about what happened after the procedure.  Have poor judgment for several hours.  Feel nauseous or vomit.  Have a sore throat if you had a breathing tube during the procedure. Follow these instructions at home: For at least 24 hours after the procedure:      Have a responsible adult stay with you. It is important to have someone help care for you until you are awake and alert.  Rest as needed.  Do not: ? Participate in activities in which you could fall or become injured. ? Drive. ? Use heavy machinery. ? Drink alcohol. ? Take sleeping pills or medicines that cause drowsiness. ? Make important decisions or sign legal documents. ? Take care of children on your own. Eating and drinking  Follow the diet that is recommended by your health care provider.  If you vomit, drink water, juice, or soup when you can drink without vomiting.  Make sure you have little or no nausea before eating solid  foods. General instructions  Take over-the-counter and prescription medicines only as told by your health care provider.  If you have sleep apnea, surgery and certain medicines can increase your risk for breathing problems. Follow instructions from your health care provider about wearing your sleep device: ? Anytime you are sleeping, including during daytime naps. ? While taking prescription pain medicines, sleeping medicines, or medicines that make you drowsy.  If you smoke, do not smoke without supervision.  Keep all follow-up visits as told by your health care provider. This is important. Contact a health care provider if:  You keep feeling nauseous or you keep vomiting.  You feel light-headed.  You develop a rash.  You have a fever. Get help right away if:  You have trouble breathing. Summary  For several hours after your procedure, you may feel sleepy and have poor judgment.  Have a responsible adult stay  with you for at least 24 hours or until you are awake and alert. This information is not intended to replace advice given to you by your health care provider. Make sure you discuss any questions you have with your health care provider. Document Revised: 10/19/2017 Document Reviewed: 11/11/2015 Elsevier Patient Education  [place].

## 2020-03-30 NOTE — Anesthesia Postprocedure Evaluation (Signed)
Anesthesia Post Note  Patient: ADDASYN MCBREEN  Procedure(s) Performed: EXCISION OF SEBACEOUS CYST ON LEFT NECK, SEBACEOUSE CYST ON BACK AND ATYPICAL MOLE ON RIGHT NECK, AND LEFT AXILLA SKIN TAG (N/A )  Patient location during evaluation: PACU Anesthesia Type: General Level of consciousness: awake Pain management: pain level controlled Vital Signs Assessment: post-procedure vital signs reviewed and stable Respiratory status: spontaneous breathing Cardiovascular status: blood pressure returned to baseline Postop Assessment: no headache Anesthetic complications: no   No complications documented.   Last Vitals:  Vitals:   03/30/20 1215 03/30/20 1226  BP: 111/79 124/79  Pulse: 68 65  Resp: 12 16  Temp:  (!) 36.4 C  SpO2: 94% 95%    Last Pain:  Vitals:   03/30/20 1226  TempSrc: Axillary  PainSc:                  Louann Sjogren

## 2020-03-30 NOTE — Anesthesia Preprocedure Evaluation (Signed)
Anesthesia Evaluation  Patient identified by MRN, date of birth, ID band Patient awake    Reviewed: Allergy & Precautions, H&P , NPO status , Patient's Chart, lab work & pertinent test results, reviewed documented beta blocker date and time   Airway Mallampati: II  TM Distance: >3 FB Neck ROM: full    Dental no notable dental hx. (+) Teeth Intact   Pulmonary asthma ,    Pulmonary exam normal breath sounds clear to auscultation       Cardiovascular Exercise Tolerance: Good hypertension,  Rhythm:regular Rate:Normal     Neuro/Psych  Headaches, Seizures -,  PSYCHIATRIC DISORDERS Anxiety  Neuromuscular disease    GI/Hepatic negative GI ROS, Neg liver ROS,   Endo/Other  negative endocrine ROS  Renal/GU negative Renal ROS  negative genitourinary   Musculoskeletal   Abdominal   Peds  Hematology negative hematology ROS (+)   Anesthesia Other Findings   Reproductive/Obstetrics negative OB ROS                             Anesthesia Physical Anesthesia Plan  ASA: III  Anesthesia Plan: General   Post-op Pain Management:    Induction:   PONV Risk Score and Plan: Propofol infusion  Airway Management Planned:   Additional Equipment:   Intra-op Plan:   Post-operative Plan:   Informed Consent: I have reviewed the patients History and Physical, chart, labs and discussed the procedure including the risks, benefits and alternatives for the proposed anesthesia with the patient or authorized representative who has indicated his/her understanding and acceptance.     Dental Advisory Given  Plan Discussed with: CRNA  Anesthesia Plan Comments:         Anesthesia Quick Evaluation

## 2020-03-30 NOTE — Transfer of Care (Signed)
Immediate Anesthesia Transfer of Care Note  Patient: Mary Wise  Procedure(s) Performed: EXCISION OF SEBACEOUS CYST ON LEFT NECK, SEBACEOUSE CYST ON BACK AND ATYPICAL MOLE ON RIGHT NECK, AND LEFT AXILLA SKIN TAG (N/A )  Patient Location: PACU  Anesthesia Type:MAC  Level of Consciousness: awake  Airway & Oxygen Therapy: Patient Spontanous Breathing and Patient connected to face mask oxygen  Post-op Assessment: Report given to RN and Post -op Vital signs reviewed and stable  Post vital signs: Reviewed and stable  Last Vitals:  Vitals Value Taken Time  BP    Temp    Pulse    Resp    SpO2      Last Pain:  Vitals:   03/30/20 0841  TempSrc: Oral  PainSc: 3          Complications: No complications documented.

## 2020-03-30 NOTE — Op Note (Signed)
[facility] Operative Note  03/30/20  Preoperative Diagnosis:  Sebaceous cyst of back, sebaceous cyst of left neck, mole of right neck, left axilla skin lesion (? seborrheic keratosis versus mole)    Postoperative Diagnosis: Same    Procedure(s) Performed: Excision of back sebaceous cyst 4cm; Excision of left neck sebaceous cyst 1cm; Excision of right neck mole 1cm; Excision of left axilla skin lesion 1cm    Surgeon: Lanell Matar. Constance Haw, MD   Assistants: No qualified resident was available    Anesthesia: Monitored anesthesia care    Anesthesiologist: Dr. Briant Cedar, MD     Specimens:  Back sebaceous cyst, right neck mole, left neck sebaceous cyst, left axilla skin lesion    Estimated Blood Loss: Minimal   Blood Replacement: None    Complications: None   Wound Class: Clean    Operative Indications: Ms. Brickman is a sweet 69 y.o. female with multiple cysts on her body and other skins lesions that cause her discomfort and snag on her clothes. She reports also being worried about the growth of her right neck mole.  We discussed excision with some sedation of the back cyst as well as the lesions on her neck and axilla, and the risk of bleeding, infection, recurrence, and finding some unexpected pathology. She opted to proceed.   Findings: Right neck flesh colored, enlarging mole; left neck sebaceous cyst, left axilla skin lesion consistent with ? seborrheic keratosis versus mole, back sebaceous cyst 4cm in size with central pit    Procedure: The patient was taken to the operating room and monitored anesthesia care was induced. Intravenous antibiotics were administered per protocol. She was placed left lateral decubitus on a bean bag with all pressure points padded. The back was prepared and draped in the usual sterile fashion.  An elliptical incision was made around the central pit of the sebaceous cyst and using a combination of sharp and blunt dissection with the scalpel,  hemostats can the needle tip cautery, the entire cyst wall was excised. It measured over 4cm in size and did rupture with keratin contents. The cavity was irrigated with saline and made hemostatic. Deep interrupted 3-0 Vicryls were made to try to close the deadspace. Nylon 3-0 interrupts closed the skin. Gauze and a tegaderm covered the incision.    The patient was then rolled to her front. The right neck was prepped and draped in the usual sterile fashion. New outer gloves were placed due to the contents of the sebaceous cyst on the back and only clean instruments were used.  An elliptical incision was made around the growing mole on the right neck (1cm) in the submental region and carried down through the skin to the subcutaneous tissue. The lesion was removed in its entirety. The tissue was made hemostatic, and the incision was closed with interrupted 4-0 Monocryl suture and dermabond. A small skin tag at the base of the right posterior neck was cauterized off at the patient's request.    The patient's left neck was then prepped and draped in the usual sterile fashion in addition to her left axilla. An elliptical incision was made around the 1cm sebaceous cyst on the left neck and carried down through to the subcutaneous tissue with the scalpel. The cyst was removed in its entirety. Hemostasis was confirmed and the incision was closed with 4-0 Monocryl and dermabond.  The left axilla skin lesion was again investigated and looked like possible seborrheic keratosis versus mole (1cm) and caused the patient discomfort  with her clothes. An elliptical incision was made around the lesion and carried down through to the subcutaneous and it was removed in its entirety.  The area was made hemostatic and closed with 4-0 Monocryl interrupted and dermabond.   The specimen were passed off the field separately.   Final inspection revealed acceptable hemostasis. All counts were correct at the end of the case. The patient  was awakened from anesthesia without complication.  The patient went to the PACU in stable condition.   Curlene Labrum, MD [facility] [address] [place], [place] [address] [phone] (office)

## 2020-03-30 NOTE — Interval H&P Note (Signed)
History and Physical Interval Note:  03/30/2020 10:06 AM  Mary Wise  has presented today for surgery, with the diagnosis of Sebaceous cyst, 4cm, Back Sebaceous cyst, 1 cm, left neck Atypical mole, 1cm, right neck.  The various methods of treatment have been discussed with the patient and family. After consideration of risks, benefits and other options for treatment, the patient has consented to  Procedure(s): EXCISION OF SEBACEOUS CYST ON LEFT NECK, SEBACEOUSE CYST ON BACK AND ATYPICAL MOLE ON RIGHT NECK (N/A) as a surgical intervention.  The patient's history has been reviewed, patient examined, no change in status, stable for surgery.  I have reviewed the patient's chart and labs.  Questions were answered to the patient's satisfaction.    Patient marked and wants her left axilla lesion, right neck, left neck, and back excision.   Virl Cagey

## 2020-03-30 NOTE — Progress Notes (Signed)
Marana with her sister. Surgery completed. Back incision with likely drain. Gauze and tape to be sent home with patient. Will get sutures out in 2 weeks. Neck and axilla incision have dermabond.   Roxicodone used in the past and sent into pharmacy.   Curlene Labrum, MD [facility] [address] [place], [place] [address] [phone] (office)

## 2020-04-03 LAB — SURGICAL PATHOLOGY

## 2020-04-12 ENCOUNTER — Ambulatory Visit: Payer: Medicare Other | Admitting: General Surgery

## 2020-04-12 ENCOUNTER — Encounter: Payer: Self-pay | Admitting: General Surgery

## 2020-04-12 ENCOUNTER — Other Ambulatory Visit: Payer: Self-pay

## 2020-04-12 ENCOUNTER — Ambulatory Visit (INDEPENDENT_AMBULATORY_CARE_PROVIDER_SITE_OTHER): Payer: Medicare Other | Admitting: General Surgery

## 2020-04-12 VITALS — BP 138/91 | HR 75 | Temp 98.1°F | Resp 18 | Ht 68.0 in | Wt 204.0 lb

## 2020-04-12 DIAGNOSIS — L723 Sebaceous cyst: Secondary | ICD-10-CM

## 2020-04-12 DIAGNOSIS — T8130XA Disruption of wound, unspecified, initial encounter: Secondary | ICD-10-CM | POA: Insufficient documentation

## 2020-04-12 NOTE — Progress Notes (Signed)
[facility] Note   HPI:  69 y.o. Female presents to clinic for post-op follow-up evaluation after excision of multiple lesions. All the pathology was benign. She has been having some minor drainage from the back incision that started yesterday. No redness.   Review of Systems:  Pain controlled Drainage back incision  All other review of systems: otherwise negative   Vital Signs:  BP (!) 138/91    Pulse 75    Temp 98.1 F (36.7 C) (Oral)    Resp 18    Ht 5\' 8"  (1.727 m)    Wt 204 lb (92.5 kg)    SpO2 95%    BMI 31.02 kg/m    Physical Exam:  Physical Exam Vitals reviewed.  Neck:     Comments: Left and right neck incisions c/d/i with dermabond, no erythema or drainage Cardiovascular:     Rate and Rhythm: Normal rate.  Pulmonary:     Effort: Pulmonary effort is normal.  Musculoskeletal:     Comments: Left axilla incision c/d/i with dermabond, no erythema or drainage; back incision with sutures, some crusting at the lower edge, some brownish clear drainage, no foul odor, looks like Liquifed hematoma, sutures removed, wound burst open, hematoma drained, packing placed   Neurological:     Mental Status: She is alert.      Assessment:  69 y.o. yo Female s/p excision of multiple areas that were all benign. She has a dehiscence of the larger sebaceous cyst excision on the back. This was not unexpected given the size of the cyst and the chronicity.   Plan:  Pack the back wound twice daily with saline dampened gauze. Cover with gauze and papertape. Ok to shower with the dressing in place and replace after showering.   Future Appointments  Date Time Provider [place]  04/19/2020  3:15 PM Virl Cagey, MD [facility] None  05/21/2020  1:30 PM [facility] None  05/21/2020  2:00 PM Truitt Merle, MD [facility] None  05/29/2020  3:30 PM Liberal, Modena Nunnery, MD [facility] None    All of the above recommendations were discussed with the patient and  patient's family, and all of patient's and family's questions were answered to their expressed satisfaction.  Curlene Labrum, MD [facility] [address] [place], [place] [address] [phone] (office)

## 2020-04-12 NOTE — Patient Instructions (Signed)
Pack the back wound twice daily with saline dampened gauze. Cover with gauze and papertape. Ok to shower with the dressing in place and replace after showering.

## 2020-04-13 ENCOUNTER — Other Ambulatory Visit: Payer: Self-pay | Admitting: Family Medicine

## 2020-04-13 DIAGNOSIS — F419 Anxiety disorder, unspecified: Secondary | ICD-10-CM

## 2020-04-13 NOTE — Telephone Encounter (Signed)
Ok to refill??  Last office visit 01/27/2020.  Last refill 02/13/2020, #1 refill.

## 2020-04-19 ENCOUNTER — Ambulatory Visit (INDEPENDENT_AMBULATORY_CARE_PROVIDER_SITE_OTHER): Payer: Medicare Other | Admitting: General Surgery

## 2020-04-19 ENCOUNTER — Encounter: Payer: Self-pay | Admitting: General Surgery

## 2020-04-19 ENCOUNTER — Other Ambulatory Visit: Payer: Self-pay

## 2020-04-19 VITALS — BP 124/86 | HR 92 | Temp 98.4°F | Resp 14 | Ht 68.0 in | Wt 205.0 lb

## 2020-04-19 DIAGNOSIS — T8130XS Disruption of wound, unspecified, sequela: Secondary | ICD-10-CM

## 2020-04-19 DIAGNOSIS — T8130XA Disruption of wound, unspecified, initial encounter: Secondary | ICD-10-CM

## 2020-04-19 DIAGNOSIS — L723 Sebaceous cyst: Secondary | ICD-10-CM

## 2020-04-19 NOTE — Patient Instructions (Signed)
Continue packing with saline dampened gauze and cover with gauze and tape.

## 2020-04-19 NOTE — Progress Notes (Signed)
[facility] Note   HPI:  69 y.o. Female presents to clinic for follow-up evaluation of her back wound that opened up after sebaceous cyst removal. Patient reports she is doing well. There is no drainage. Her sister is packing the area.  Review of Systems:  No fevers or chills All other review of systems: otherwise negative   Vital Signs:  BP 124/86   Pulse 92   Temp 98.4 F (36.9 C) (Oral)   Resp 14   Ht 5\' 8"  (1.727 m)   Wt 205 lb (93 kg)   SpO2 94%   BMI 31.17 kg/m    Physical Exam:  Physical Exam HENT:     Head: Normocephalic.  Cardiovascular:     Rate and Rhythm: Normal rate.  Pulmonary:     Effort: Pulmonary effort is normal.  Musculoskeletal:     Comments: Healing open wound with granulation, 25% improvement from prior, no erythema or drainage, saline gauze replaced, minor rash where type has been on skin  Neurological:     Mental Status: She is alert.     Assessment:  69 y.o. yo Female with a wound on the back after dehiscence of sebaceous cyst excision site. Doing well overall.  Plan:  Continue packing with saline dampened gauze and cover with gauze and tape.   Future Appointments  Date Time Provider [place]  05/03/2020  2:45 PM Virl Cagey, MD [facility] None  05/21/2020  1:30 PM [facility] None  05/21/2020  2:00 PM Truitt Merle, MD [facility] None  05/29/2020  3:30 PM Buelah Manis, Modena Nunnery, MD [facility] None     All of the above recommendations were discussed with the patient and patient's family, and all of patient's and family's questions were answered to their expressed satisfaction.  Curlene Labrum, MD [facility] [address] [place], [place] [address] [phone] (office)

## 2020-04-25 DIAGNOSIS — M25562 Pain in left knee: Secondary | ICD-10-CM | POA: Diagnosis not present

## 2020-04-26 ENCOUNTER — Other Ambulatory Visit: Payer: Self-pay | Admitting: Family Medicine

## 2020-04-27 ENCOUNTER — Other Ambulatory Visit: Payer: Self-pay | Admitting: Family Medicine

## 2020-04-27 DIAGNOSIS — J4520 Mild intermittent asthma, uncomplicated: Secondary | ICD-10-CM

## 2020-05-03 ENCOUNTER — Encounter: Payer: Self-pay | Admitting: General Surgery

## 2020-05-03 ENCOUNTER — Other Ambulatory Visit: Payer: Self-pay

## 2020-05-03 ENCOUNTER — Ambulatory Visit (INDEPENDENT_AMBULATORY_CARE_PROVIDER_SITE_OTHER): Payer: Medicare Other | Admitting: General Surgery

## 2020-05-03 VITALS — BP 159/83 | HR 88 | Temp 98.3°F | Resp 14 | Ht 68.0 in | Wt 208.0 lb

## 2020-05-03 DIAGNOSIS — T8130XA Disruption of wound, unspecified, initial encounter: Secondary | ICD-10-CM

## 2020-05-03 DIAGNOSIS — N644 Mastodynia: Secondary | ICD-10-CM

## 2020-05-03 DIAGNOSIS — L723 Sebaceous cyst: Secondary | ICD-10-CM

## 2020-05-03 NOTE — Progress Notes (Signed)
[facility] Note   HPI:  69 y.o. Female presents to clinic for follow-up evaluation of her back wound that had dehiscence. She has been packing this an it is going well. She also complains of left upper breast/ chest pain in the muscle and over the rib that is reproduced with palpation. She had a prior partial mastectomy for breast cancer in that breast. Her last mammogram was 11/2019.   Review of Systems:  No drainage Left breast/ chest wall pain  All other review of systems: otherwise negative   Vital Signs:  BP (!) 159/83   Pulse 88   Temp 98.3 F (36.8 C) (Oral)   Resp 14   Ht 5\' 8"  (1.727 m)   Wt 208 lb (94.3 kg)   SpO2 94%   BMI 31.63 kg/m    Physical Exam:  Physical Exam Vitals reviewed.  Cardiovascular:     Rate and Rhythm: Normal rate.  Pulmonary:     Effort: Pulmonary effort is normal.  Chest:     Comments: Left breast without masses, chest wall and upper breast tender to palpation, incision lateral healed Musculoskeletal:     Comments: Back wound healing with granulation, packing replaced, no erythema   Neurological:     Mental Status: She is alert.      Assessment:  69 y.o. yo Female with healing back wound and left breast pain.  Plan:  Continue packing until completely superficial/ new skin on area then change to dry bandaid. Try lidocaine patches and ibuprofen for pain over upper breast/ chest. Discuss with Oncologist and may have to get earlier mammogram if no improvement given history   Future Appointments  Date Time Provider [place]  05/21/2020  1:30 PM [facility] None  05/21/2020  2:00 PM Truitt Merle, MD [facility] None  05/29/2020  3:30 PM Alycia Rossetti, MD [facility] None  05/31/2020  1:30 PM Virl Cagey, MD [facility] None     Curlene Labrum, MD [facility] [address] [place], [place] [address] [phone] (office)

## 2020-05-03 NOTE — Patient Instructions (Signed)
Continue packing until completely superficial/ new skin on area then change to dry bandaid. Try lidocaine patches and ibuprofen for pain over upper breast/ chest. Discuss with Oncologist.

## 2020-05-09 ENCOUNTER — Other Ambulatory Visit: Payer: Self-pay | Admitting: Family Medicine

## 2020-05-10 NOTE — Telephone Encounter (Signed)
Ok to refill??  Last office visit 01/27/2020.  Last refill on Ambien 01/13/2020, #3 refills.

## 2020-05-18 NOTE — Progress Notes (Signed)
[place]   [phone] [phone]   Clinic Follow up Note   Patient Care Team: [facility], Modena Nunnery, MD as PCP - General (Family Medicine) Truitt Merle, MD as Consulting Physician (Hematology) Eppie Gibson, MD as Attending Physician (Radiation Oncology) Alla Feeling, NP as Nurse Practitioner (Nurse Practitioner) Virl Cagey, MD as Consulting Physician (General Surgery) Daneil Dolin, MD as Consulting Physician (Gastroenterology)  Date of Service:  05/21/2020  CHIEF COMPLAINT: F/u triple negativeleftbreast cancer  SUMMARY OF ONCOLOGIC HISTORY: Oncology History Overview Note  Cancer Staging Malignant neoplasm of upper-outer quadrant of left breast in female, estrogen receptor negative ([place]) Staging form: Breast, AJCC 8th Edition - Pathologic stage from 08/19/2017: Stage IIA (pT2, pN0(sn), cM0, G3, ER: Negative, PR: Negative, HER2: Negative) - Signed by Ardath Sax, MD on 09/01/2017     Malignant neoplasm of upper-outer quadrant of left breast in female, estrogen receptor negative ([place])  06/16/2017 Initial Diagnosis   Malignant neoplasm of upper-outer quadrant of left breast in female: Initially presented with left deltoid mass which was consistent with lipoma.  During examination, a mass in the left breast was appreciated by palpation.   06/16/2017 Mammogram   BL Diagnostic Study: Irregular hyperdense mass in the upper outer quadrant of the left breast measuring 3.6 cm without radiographically detectable lymphadenopathy.  Normal appearance of the right breast.   06/16/2017 Breast US   3.2 cm irregular mass located at 2:30 o'clock, no pathological lymphadenopathy.   06/23/2017 Pathology Results   Ultrasound-guided biopsy: Positive for invasive ductal carcinoma, grade 3, ER 0%, PR 0%, HER-2 negative by [facility]   07/31/2017 Surgery   Partial left mastectomy with sentinel lymph node biopsy --Pathology: Invasive ductal carcinoma,  measuring 4.5 cm, grade 3.  All margins negative, but the anterior margin less than 0.1 cm.  No surrounding DCIS.  0/5 lymph nodes positive for malignancy   10/08/2017 PET scan   IMPRESSION: 1. No evidence breast cancer metastasis on FDG PET-CT scan. 2. Postsurgical change in the LEFT lateral breast and LEFT axilla.   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 Significance The following genes were evaluated for sequence changes and exonic deletions/duplications: ALK, APC, ATM, AXIN2, BAP1, BARD1, BLM, BMPR1A, BRCA1, BRCA2, BRIP1, CASR, CDC73, CDH1, CDK4, CDKN1B, CDKN1C, CDKN2A (p14ARF), CDKN2A (p16INK4a), CEBPA, CHEK2, CTNNA1, DICER1, DIS3L2, EPCAM*, FH, FLCN, GATA2, GPC3, GREM1*, HRAS, KIT, MAX, MEN1, MET, MLH1, MSH2, MSH3, MSH6, MUTYH, NBN, NF1, NF2, PALB2, PDGFRA, PHOX2B*, PMS2, POLD1, POLE, POT1, PRKAR1A, PTCH1, PTEN, RAD50, RAD51C, RAD51D, RB1, RECQL4, RET, RUNX1, SDHAF2, SDHB, SDHC, SDHD, SMAD4, SMARCA4, SMARCB1, SMARCE1, STK11, SUFU, TERC, TERT, TMEM127, TP53, TSC1, TSC2, VHL, WRN*, WT1 The following genes were evaluated for sequence changes only: EGFR*, HOXB13*, MITF*, NTHL1*, SDHA Results are negative unless otherwise indicated   05/27/2018 - 06/23/2018 Radiation Therapy   Radiation treatment dates:   05/27/2018 - 06/23/2018  Site/dose:    1. Left Breast / 40.05 Gy in 15 fractions 2. Left Breast Boost / 10 Gy in 5 fractions - Total dose of 50.05 Gy  Beams/energy:    1. 3D / 6X Photon 2. Isodose / 10X, 6X Photon  Narrative: The patient tolerated radiation treatment relatively well.  She experienced fatigue and some expected skin irritation with mild erythema and soreness to  her breast/axilla. She is  applying Radiaplex twice daily as directed. She also mentioned bilateral hand cramping with bruising. She was advised to discuss this with her PCP as it is not related to her radiation treatment.    Survivorship   Per Cira Rue, NP       CURRENT THERAPY:  Surveillance  INTERVAL HISTORY:  Mary Wise is here for a follow up of left breast cancer. She was last seen by me in 03/2019 and seen by NP Lacie 6 months ago in interim. She presents to the clinic alone. She notes she is doing well. She notes she recently had skin lesions removed on her back, benign. She notes she is working out again. She denies any residual issues from prior chemo except mild residual neuropathy in hands. She is taking oral Nervene. She is on Gabapentin and cholesterol medication. She is on more supplements. She notes she is able to sleep for 4 hours even on Ambien. She had left knee surgery in 07/2019 with Dr Percell Miller.  She notes she will get PNA vaccine soon and has had her COVID booster vaccine. She does not plan to have flu shot as she feels she gets sick after each dose.    REVIEW OF SYSTEMS:  Constitutional: Denies fevers, chills or abnormal weight loss (+) Insomnia  Eyes: Denies blurriness of vision Ears, nose, mouth, throat, and face: Denies mucositis or sore throat Respiratory: Denies cough, dyspnea or wheezes Cardiovascular: Denies palpitation, chest discomfort or lower extremity swelling Gastrointestinal:  Denies nausea, heartburn or change in bowel habits Skin: Denies abnormal skin rashes Lymphatics: Denies new lymphadenopathy or easy bruising Neurological: (+) Mild neuropathy in hands and feet.  Behavioral/Psych: Mood is stable, no new changes  All other systems were reviewed with the patient and are negative.  MEDICAL HISTORY:  Past Medical History:  Diagnosis Date  . Anxiety   . Asthma   . Cancer [facility])    left breast cancer  . Family history of cancer   . Genetic testing 02/01/2018    Multi-Cancer panel (83 genes) @ [facility] - No pathogenic mutations detected  . Hemorrhoids   . History of radiation therapy 05/27/18- 06/23/18   Left Breast 40.05 Gy in 15 fractions, left breast boost 10 Gy in 5 fractions.   . Lipoma of left shoulder   . Migraine   . Migraine   . Osteopenia   . 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 ABDOMINAL HYSTERECTOMY    . APPENDECTOMY    . CESAREAN SECTION    . CHOLECYSTECTOMY    . ERCP    . IR FLUORO GUIDE PORT INSERTION RIGHT  10/05/2017  . IR REMOVAL TUN ACCESS W/ PORT W/O FL MOD SED  05/13/2018  . IR US GUIDE VASC ACCESS RIGHT  10/05/2017  . LIPOMA EXCISION Left 03/16/2018   left shoulder  . LIVER SURGERY    . MASS EXCISION N/A 03/30/2020   Procedure: EXCISION OF SEBACEOUS CYST ON LEFT NECK, SEBACEOUSE CYST ON BACK AND ATYPICAL MOLE ON RIGHT NECK, AND LEFT AXILLA SKIN TAG;  Surgeon: Virl Cagey, MD;  Location: [facility]S;  Service: General;  Laterality: N/A;  . PARTIAL MASTECTOMY WITH AXILLARY SENTINEL LYMPH NODE BIOPSY Left 07/31/2017   Procedure: LEFT PARTIAL MASTECTOMY WITH AXILLARY SENTINEL LYMPH NODE BIOPSY;  Surgeon: Virl Cagey, MD;  Location: [facility];  Service: General;  Laterality: Left;  .  TOTAL KNEE ARTHROPLASTY Left 07/05/2019   Procedure: TOTAL KNEE ARTHROPLASTY;  Surgeon: Renette Butters, MD;  Location: [facility];  Service: Orthopedics;  Laterality: Left;    I have reviewed the social history and family history with the patient and they are unchanged from previous note.  ALLERGIES:  is allergic to onion, penicillins, sulfa antibiotics, yellow dye, tape, clindamycin/lincomycin, and lipitor [atorvastatin calcium].  MEDICATIONS:  Current Outpatient Medications  Medication Sig Dispense Refill  . OVER THE COUNTER MEDICATION OmegaXL    . OVER THE COUNTER MEDICATION Nervive    . albuterol (PROVENTIL) (2.5 MG/3ML) 0.083% nebulizer solution  Take 3 mLs (2.5 mg total) by nebulization every 6 (six) hours as needed for wheezing or shortness of breath. 90 mL 3  . albuterol (VENTOLIN HFA) 108 (90 Base) MCG/ACT inhaler INHALE 2 PUFFS BY MOUTH EVERY 6 HOURS AS NEEDED FOR WHEEZING OR SHORTNESS OF BREATH 18 g 0  . ALPRAZolam (XANAX) 1 MG tablet Take 1 tablet by mouth twice daily as needed for anxiety 60 tablet 1  . fluticasone (FLOVENT HFA) 110 MCG/ACT inhaler Inhale 1 puff into the lungs in the morning and at bedtime. (Patient taking differently: Inhale 2 puffs into the lungs in the morning and at bedtime. ) 1 Inhaler 3  . ibuprofen (ADVIL) 800 MG tablet TAKE 1 TABLET BY MOUTH EVERY 8 HOURS AS NEEDED 30 tablet 3  . omeprazole (PRILOSEC) 20 MG capsule Take 1 capsule (20 mg total) by mouth daily. 30 days for gastroprotection while taking NSAIDs. 90 capsule 1  . SUMAtriptan (IMITREX) 100 MG tablet TAKE 1 TABLET BY MOUTH AT ONSET OF MIGRAINE, IF SYMPTOMS PERSIST 2 HOURS LATER THEN TAKE AN ADDITIONAL DOSE. MAX OF 2 TABLETS PER 24 HOURS 9 tablet 3  . zolpidem (AMBIEN) 10 MG tablet TAKE 1 TABLET BY MOUTH AT BEDTIME AS NEEDED FOR SLEEP. 30 tablet 3   No current facility-administered medications for this visit.    PHYSICAL EXAMINATION: ECOG PERFORMANCE STATUS: 0 - Asymptomatic  Vitals:   05/21/20 1410  BP: (!) 152/88  Pulse: 76  Resp: 18  Temp: (!) 97.2 F (36.2 C)  SpO2: 97%   Filed Weights   05/21/20 1410  Weight: 205 lb 6.4 oz (93.2 kg)    GENERAL:alert, no distress and comfortable SKIN: skin color, texture, turgor are normal, no rashes or significant lesions EYES: normal, Conjunctiva are pink and non-injected, sclera clear  NECK: supple, thyroid normal size, non-tender, without nodularity LYMPH:  no palpable lymphadenopathy in the cervical, axillary  LUNGS: clear to auscultation and percussion with normal breathing effort HEART: regular rate & rhythm and no murmurs and no lower extremity edema ABDOMEN:abdomen soft, non-tender and  normal bowel sounds Musculoskeletal:no cyanosis of digits and no clubbing  NEURO: alert & oriented x 3 with fluent speech, no focal motor/sensory deficits BREAST: S/p left partial mastectomy: surgical incision healed well with mild scar tissue. (+) Mild skin erythema of b/l breast skin folds. Right Breast exam benign.   LABORATORY DATA:  I have reviewed the data as listed CBC Latest Ref Rng & Units 05/21/2020 01/27/2020 11/21/2019  WBC 4.0 - 10.5 K/uL 7.5 7.2 4.1  Hemoglobin 12.0 - 15.0 g/dL 13.1 13.3 12.7  Hematocrit 36 - 46 % 39.7 40.2 39.9  Platelets 150 - 400 K/uL 327 346 257     CMP Latest Ref Rng & Units 05/21/2020 01/27/2020 11/21/2019  Glucose 70 - 99 mg/dL 83 82 92  BUN 8 - 23 mg/dL '14 13 11  ' Creatinine  0.44 - 1.00 mg/dL 0.82 0.84 0.82  Sodium 135 - 145 mmol/L 139 140 139  Potassium 3.5 - 5.1 mmol/L 3.7 4.1 3.9  Chloride 98 - 111 mmol/L 103 102 105  CO2 22 - 32 mmol/L 32 27 26  Calcium 8.9 - 10.3 mg/dL 9.8 9.8 8.6(L)  Total Protein 6.5 - 8.1 g/dL 7.7 7.6 7.6  Total Bilirubin 0.3 - 1.2 mg/dL 0.4 0.3 0.3  Alkaline Phos 38 - 126 U/L 98 - 115  AST 15 - 41 U/L 18 21 34  ALT 0 - 44 U/L '17 20 28      ' RADIOGRAPHIC STUDIES: I have personally reviewed the radiological images as listed and agreed with the findings in the report. No results found.   ASSESSMENT & PLAN:  SANDE PICKERT is a 69 y.o. female with    1. 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 Proliferation marker Ki67 at 90% -She was diagnosed in 06/2017. She is s/p partial left mastectomy, Adjuvant chemo AC-T and adjuvant radiation. -She is clinically doing well. She has minimal neuropathy in fingertips form prior chemo. Lab reviewed, her CBC and CMP are within normal limits. Her physical exam and her 11/2019 mammogram were unremarkable. There is no clinical concern for recurrence. -She is almost 3 years since her cancer diagnosis. Her risk of  recurrence will significantly decrease after 3 years. Continue 5 year surveillance plan. Next Mammogram in 12/2020.  -f/u in 6 months  -She notes she will get PNA vaccine soon and has had her COVID booster vaccine. She does not plan to have flu shot.   2. Anxiety, insomnia -controlled on xanax and ambien PRN. She still only able to get 4 hours of sleep, manageable.   3. Osteopenia -last DEXA In 02/2019 shows osteopenia at the [facility] spine. She has low calcium 8.6 today but is intolerant to calcium supplements. Continue Vit D and exercise.   4. Genetictesting was negative  5. Arthralgia and back pain -Controlled with baclofen, previously on flexeril but lost its efficacy  -s/p left knee replacement. She is followed by Dr. Fredonia Highland  6. 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 She is no longer on Gabapentin.  -minimal in fingertips. Continue Nervene supplement which she has found helpful.   Plan -Mammogram in 12/2020 -Lab and F/u in 6 months    No problem-specific Assessment & Plan notes found for this encounter.   Orders Placed This Encounter  Procedures  . MM DIAG BREAST TOMO BILATERAL    Standing Status:   Future    Standing Expiration Date:   05/21/2021    Order Specific Question:   Reason for Exam (SYMPTOM  OR DIAGNOSIS REQUIRED)    Answer:   screening    Order Specific Question:   Preferred imaging location?    Answer:   [facility]   All questions were answered. The patient knows to call the clinic with any problems, questions or concerns. No barriers to learning was detected. The total time spent in the appointment was 30 minutes.     Truitt Merle, MD 05/21/2020   I, Joslyn Devon, am acting as scribe for Truitt Merle, MD.   I have reviewed the above documentation for accuracy and completeness, and I agree with the above.

## 2020-05-21 ENCOUNTER — Inpatient Hospital Stay: Payer: Medicare Other | Attending: Hematology

## 2020-05-21 ENCOUNTER — Telehealth: Payer: Self-pay | Admitting: Hematology

## 2020-05-21 ENCOUNTER — Encounter: Payer: Self-pay | Admitting: Hematology

## 2020-05-21 ENCOUNTER — Inpatient Hospital Stay (HOSPITAL_BASED_OUTPATIENT_CLINIC_OR_DEPARTMENT_OTHER): Payer: Medicare Other | Admitting: Hematology

## 2020-05-21 ENCOUNTER — Other Ambulatory Visit: Payer: Self-pay

## 2020-05-21 ENCOUNTER — Ambulatory Visit: Payer: Medicare Other | Admitting: Hematology

## 2020-05-21 ENCOUNTER — Other Ambulatory Visit: Payer: Medicare Other

## 2020-05-21 VITALS — BP 152/88 | HR 76 | Temp 97.2°F | Resp 18 | Ht 68.0 in | Wt 205.4 lb

## 2020-05-21 DIAGNOSIS — C50912 Malignant neoplasm of unspecified site of left female breast: Secondary | ICD-10-CM | POA: Insufficient documentation

## 2020-05-21 DIAGNOSIS — G4700 Insomnia, unspecified: Secondary | ICD-10-CM | POA: Insufficient documentation

## 2020-05-21 DIAGNOSIS — G620 Drug-induced polyneuropathy: Secondary | ICD-10-CM | POA: Diagnosis not present

## 2020-05-21 DIAGNOSIS — C50412 Malignant neoplasm of upper-outer quadrant of left female breast: Secondary | ICD-10-CM

## 2020-05-21 DIAGNOSIS — Z9012 Acquired absence of left breast and nipple: Secondary | ICD-10-CM | POA: Diagnosis not present

## 2020-05-21 DIAGNOSIS — Z923 Personal history of irradiation: Secondary | ICD-10-CM | POA: Diagnosis not present

## 2020-05-21 DIAGNOSIS — Z9221 Personal history of antineoplastic chemotherapy: Secondary | ICD-10-CM | POA: Diagnosis not present

## 2020-05-21 DIAGNOSIS — F419 Anxiety disorder, unspecified: Secondary | ICD-10-CM | POA: Diagnosis not present

## 2020-05-21 DIAGNOSIS — Z96652 Presence of left artificial knee joint: Secondary | ICD-10-CM | POA: Diagnosis not present

## 2020-05-21 DIAGNOSIS — Z171 Estrogen receptor negative status [ER-]: Secondary | ICD-10-CM | POA: Insufficient documentation

## 2020-05-21 DIAGNOSIS — M8580 Other specified disorders of bone density and structure, unspecified site: Secondary | ICD-10-CM | POA: Diagnosis not present

## 2020-05-21 LAB — CBC WITH DIFFERENTIAL (CANCER CENTER ONLY)
Abs Immature Granulocytes: 0.03 10*3/uL (ref 0.00–0.07)
Basophils Absolute: 0.1 10*3/uL (ref 0.0–0.1)
Basophils Relative: 1 %
Eosinophils Absolute: 0.3 10*3/uL (ref 0.0–0.5)
Eosinophils Relative: 4 %
HCT: 39.7 % (ref 36.0–46.0)
Hemoglobin: 13.1 g/dL (ref 12.0–15.0)
Immature Granulocytes: 0 %
Lymphocytes Relative: 39 %
Lymphs Abs: 3 10*3/uL (ref 0.7–4.0)
MCH: 29.2 pg (ref 26.0–34.0)
MCHC: 33 g/dL (ref 30.0–36.0)
MCV: 88.4 fL (ref 80.0–100.0)
Monocytes Absolute: 0.5 10*3/uL (ref 0.1–1.0)
Monocytes Relative: 6 %
Neutro Abs: 3.7 10*3/uL (ref 1.7–7.7)
Neutrophils Relative %: 50 %
Platelet Count: 327 10*3/uL (ref 150–400)
RBC: 4.49 MIL/uL (ref 3.87–5.11)
RDW: 13.2 % (ref 11.5–15.5)
WBC Count: 7.5 10*3/uL (ref 4.0–10.5)
nRBC: 0 % (ref 0.0–0.2)

## 2020-05-21 LAB — CMP (CANCER CENTER ONLY)
ALT: 17 U/L (ref 0–44)
AST: 18 U/L (ref 15–41)
Albumin: 3.9 g/dL (ref 3.5–5.0)
Alkaline Phosphatase: 98 U/L (ref 38–126)
Anion gap: 4 — ABNORMAL LOW (ref 5–15)
BUN: 14 mg/dL (ref 8–23)
CO2: 32 mmol/L (ref 22–32)
Calcium: 9.8 mg/dL (ref 8.9–10.3)
Chloride: 103 mmol/L (ref 98–111)
Creatinine: 0.82 mg/dL (ref 0.44–1.00)
GFR, Estimated: >60 mL/min (ref >60)
Glucose, Bld: 83 mg/dL (ref 70–99)
Potassium: 3.7 mmol/L (ref 3.5–5.1)
Sodium: 139 mmol/L (ref 135–145)
Total Bilirubin: 0.4 mg/dL (ref 0.3–1.2)
Total Protein: 7.7 g/dL (ref 6.5–8.1)

## 2020-05-21 NOTE — Telephone Encounter (Signed)
Scheduled per 10/18 los. Printed avs and calendar for pt.

## 2020-05-29 ENCOUNTER — Ambulatory Visit (INDEPENDENT_AMBULATORY_CARE_PROVIDER_SITE_OTHER): Payer: Medicare Other | Admitting: Family Medicine

## 2020-05-29 ENCOUNTER — Ambulatory Visit: Payer: Medicare Other | Admitting: Family Medicine

## 2020-05-29 ENCOUNTER — Encounter: Payer: Self-pay | Admitting: Family Medicine

## 2020-05-29 ENCOUNTER — Other Ambulatory Visit: Payer: Self-pay

## 2020-05-29 VITALS — BP 140/80 | HR 88 | Temp 98.2°F | Ht 68.0 in | Wt 205.2 lb

## 2020-05-29 DIAGNOSIS — I10 Essential (primary) hypertension: Secondary | ICD-10-CM

## 2020-05-29 DIAGNOSIS — F419 Anxiety disorder, unspecified: Secondary | ICD-10-CM

## 2020-05-29 DIAGNOSIS — Z23 Encounter for immunization: Secondary | ICD-10-CM | POA: Diagnosis not present

## 2020-05-29 DIAGNOSIS — G43709 Chronic migraine without aura, not intractable, without status migrainosus: Secondary | ICD-10-CM

## 2020-05-29 DIAGNOSIS — J4520 Mild intermittent asthma, uncomplicated: Secondary | ICD-10-CM | POA: Diagnosis not present

## 2020-05-29 DIAGNOSIS — E782 Mixed hyperlipidemia: Secondary | ICD-10-CM | POA: Diagnosis not present

## 2020-05-29 MED ORDER — FLOVENT HFA 110 MCG/ACT IN AERO
2.0000 | INHALATION_SPRAY | Freq: Two times a day (BID) | RESPIRATORY_TRACT | 6 refills | Status: DC
Start: 2020-05-29 — End: 2020-10-03

## 2020-05-29 MED ORDER — IBUPROFEN 800 MG PO TABS
800.0000 mg | ORAL_TABLET | Freq: Three times a day (TID) | ORAL | 3 refills | Status: DC | PRN
Start: 2020-05-29 — End: 2023-04-10

## 2020-05-29 MED ORDER — SUMATRIPTAN SUCCINATE 100 MG PO TABS
ORAL_TABLET | ORAL | 3 refills | Status: DC
Start: 2020-05-29 — End: 2020-11-13

## 2020-05-29 MED ORDER — OMEPRAZOLE 20 MG PO CPDR
20.0000 mg | DELAYED_RELEASE_CAPSULE | Freq: Every day | ORAL | 1 refills | Status: DC
Start: 2020-05-29 — End: 2020-11-08

## 2020-05-29 MED ORDER — ALPRAZOLAM 1 MG PO TABS: 1.0000 mg | ORAL_TABLET | Freq: Two times a day (BID) | ORAL | 1 refills | Status: DC | PRN

## 2020-05-29 MED ORDER — ALBUTEROL SULFATE (2.5 MG/3ML) 0.083% IN NEBU: 2.5000 mg | INHALATION_SOLUTION | Freq: Four times a day (QID) | RESPIRATORY_TRACT | 3 refills | Status: AC | PRN

## 2020-05-29 NOTE — Assessment & Plan Note (Signed)
imitrex refilled

## 2020-05-29 NOTE — Progress Notes (Signed)
   Subjective:    Patient ID: Mary Wise, female    DOB: May 25, 1951, 69 y.o.   MRN: [id number]  Patient presents for Follow-up (x60mos)  Pt here to f/u complications and needs meds refilled   She is taking Omega XL, Nervive   She had cyst removed in Augst, very large and deep and she has had prolonged     Migraines she is currently having a migraine.  She has not eaten anything because she has had some nausea associated.  She was going to get her cholesterol checked as well.  She is out of her Imitrex and requesting this today to help treat her migraine.  She has been off gabaentin and feels much better   Due for PNA vaccine   She is not having issues with her asthma.  But she does need her inhalers refilled. Flu shot  HTN - bp has been stable off meds, no chest pain, noSOB   Review Of Systems:  GEN- denies fatigue, fever, weight loss,weakness, recent illness HEENT- denies eye drainage, change in vision, nasal discharge, CVS- denies chest pain, palpitations RESP- denies SOB, cough, wheeze ABD- denies N/V, change in stools, abd pain GU- denies dysuria, hematuria, dribbling, incontinence MSK- denies joint pain, muscle aches, injury Neuro- denies headache, dizziness, syncope, seizure activity       Objective:    BP 140/80 (BP Location: Right Arm, Patient Position: Sitting, Cuff Size: Normal)   Pulse 88   Temp 98.2 F (36.8 C) (Oral)   Ht 5\' 8"  (1.727 m)   Wt 205 lb 3.2 oz (93.1 kg)   SpO2 95%   BMI 31.20 kg/m  GEN- NAD, alert and oriented x3 HEENT- PERRL, EOMI, non injected sclera, pink conjunctiva, MMM, oropharynx clear Neck- Supple, no thyromegaly CVS- RRR, no murmur RESP-CTAB ABD-NABS,soft,NT,ND Skin Mid back bandage in place  EXT- No edema Pulses- Radial, DP- 2+        Assessment & Plan:      Problem List Items Addressed This Visit      Unprioritized   Anxiety    Continue xanax prn       Relevant Medications   ALPRAZolam (XANAX) 1 MG tablet    Asthma    Controlled, prevnar 13 given today Needs PNA 23 in 1 year       Relevant Medications   albuterol (PROVENTIL) (2.5 MG/3ML) 0.083% nebulizer solution   fluticasone (FLOVENT HFA) 110 MCG/ACT inhaler   HTN, goal below 140/90    Bp mildly elevated with her migraine, no changes to meds Recent CMET/CBC reviewed       Hyperlipemia    Treating with diet, did not tolerate statins      Relevant Orders   Lipid panel   Migraines - Primary    imitrex refilled       Relevant Medications   ibuprofen (ADVIL) 800 MG tablet   SUMAtriptan (IMITREX) 100 MG tablet      Note: This dictation was prepared with Dragon dictation along with smaller phrase technology. Any transcriptional errors that result from this process are unintentional.

## 2020-05-29 NOTE — Assessment & Plan Note (Signed)
Controlled, prevnar 13 given today Needs PNA 23 in 1 year

## 2020-05-29 NOTE — Assessment & Plan Note (Signed)
Continue xanax prn.   

## 2020-05-29 NOTE — Assessment & Plan Note (Signed)
Bp mildly elevated with her migraine, no changes to meds Recent CMET/CBC reviewed

## 2020-05-29 NOTE — Assessment & Plan Note (Signed)
Treating with diet, did not tolerate statins

## 2020-05-29 NOTE — Patient Instructions (Signed)
F/U 6 months

## 2020-05-29 NOTE — Addendum Note (Signed)
Addended by: Darlina Rumpf A on: 05/29/2020 04:00 PM   Modules accepted: Orders

## 2020-05-30 ENCOUNTER — Other Ambulatory Visit: Payer: Self-pay

## 2020-05-30 DIAGNOSIS — E782 Mixed hyperlipidemia: Secondary | ICD-10-CM

## 2020-05-30 LAB — LIPID PANEL
Cholesterol: 253 mg/dL — ABNORMAL HIGH (ref <200)
HDL: 58 mg/dL (ref >=50)
LDL Cholesterol (Calc): 167 mg/dL (calc) — ABNORMAL HIGH
Non-HDL Cholesterol (Calc): 195 mg/dL (calc) — ABNORMAL HIGH (ref <130)
Total CHOL/HDL Ratio: 4.4 (calc) (ref <5.0)
Triglycerides: 142 mg/dL (ref <150)

## 2020-05-30 MED ORDER — EZETIMIBE 10 MG PO TABS: 10.0000 mg | ORAL_TABLET | Freq: Every day | ORAL | 3 refills | Status: DC

## 2020-05-31 ENCOUNTER — Encounter: Payer: Self-pay | Admitting: General Surgery

## 2020-05-31 ENCOUNTER — Ambulatory Visit (INDEPENDENT_AMBULATORY_CARE_PROVIDER_SITE_OTHER): Payer: Medicare Other | Admitting: General Surgery

## 2020-05-31 ENCOUNTER — Other Ambulatory Visit: Payer: Self-pay

## 2020-05-31 VITALS — BP 137/91 | HR 97 | Temp 98.3°F | Resp 16 | Ht 68.0 in | Wt 204.0 lb

## 2020-05-31 DIAGNOSIS — L723 Sebaceous cyst: Secondary | ICD-10-CM

## 2020-05-31 DIAGNOSIS — T8130XA Disruption of wound, unspecified, initial encounter: Secondary | ICD-10-CM

## 2020-05-31 NOTE — Progress Notes (Signed)
[facility] Note   HPI:  69 y.o. Female presents to clinic for follow-up evaluation of her back dehiscence. It is healing. No further packing. Pain in the left breast/ chest is resolved after changing some medication. She says she has less inflammation and feels less puffy and is taking 4-5 less medications.   Review of Systems:  Scab over back dehiscence  All other review of systems: otherwise negative   Vital Signs:  BP (!) 137/91   Pulse 97   Temp 98.3 F (36.8 C) (Oral)   Resp 16   Ht 5\' 8"  (1.727 m)   Wt 204 lb (92.5 kg)   SpO2 95%   BMI 31.02 kg/m    Physical Exam:  Physical Exam Vitals reviewed.  Cardiovascular:     Rate and Rhythm: Normal rate.  Pulmonary:     Effort: Pulmonary effort is normal.  Chest:     Comments: Back with scab over dehiscence site, no erythema or drainage, healing Abdominal:     Palpations: Abdomen is soft.       Assessment:  69 y.o. yo Female with dehiscence of back incision after cyst removal. Left breast/chest pain resolved.  Plan:  Continue dry dressing as needed. Scab will fall off at some point and may bleed or have raw skin under it. That is expected. Keep covered until new skin "purply" skin coming through  PRN follow up  Curlene Labrum, MD [facility] [address] [place], [place] [address] [phone] (office)

## 2020-05-31 NOTE — Patient Instructions (Signed)
Continue dry dressing as needed. Scab will fall off at some point and may bleed or have raw skin under it. That is expected. Keep covered until new skin "purply" skin coming through.

## 2020-06-22 ENCOUNTER — Other Ambulatory Visit: Payer: Self-pay | Admitting: Family Medicine

## 2020-06-22 DIAGNOSIS — J4520 Mild intermittent asthma, uncomplicated: Secondary | ICD-10-CM

## 2020-08-04 HISTORY — PX: OTHER SURGICAL HISTORY: SHX169

## 2020-08-08 ENCOUNTER — Other Ambulatory Visit: Payer: Self-pay | Admitting: Family Medicine

## 2020-08-08 DIAGNOSIS — F419 Anxiety disorder, unspecified: Secondary | ICD-10-CM

## 2020-08-14 ENCOUNTER — Telehealth: Payer: Self-pay | Admitting: *Deleted

## 2020-08-14 NOTE — Telephone Encounter (Signed)
Received fax for cardiac genetic testing.   PCP reviewed and states that [facility] does not order genetic testing. Recommended cardiology referral if patient is concerned.   Call placed to patient and patient made aware per VM.

## 2020-08-23 ENCOUNTER — Telehealth: Payer: Self-pay | Admitting: Family Medicine

## 2020-08-23 NOTE — Telephone Encounter (Signed)
Sharyn Lull front desk ( LVM)call patient to inform that Dr.[place] doesn't order gentic testing  pt would like to have a [facility] also inform [facility] to

## 2020-09-05 ENCOUNTER — Other Ambulatory Visit: Payer: Self-pay | Admitting: Family Medicine

## 2020-09-05 DIAGNOSIS — F419 Anxiety disorder, unspecified: Secondary | ICD-10-CM

## 2020-09-05 NOTE — Telephone Encounter (Signed)
Ok to refill??  Last office visit 05/29/2020.  Last refill on Xanax 08/08/2020.  Last refill on Ambien 05/11/2021, #3 refills.

## 2020-09-10 ENCOUNTER — Other Ambulatory Visit: Payer: Medicare Other

## 2020-09-10 ENCOUNTER — Other Ambulatory Visit: Payer: Self-pay

## 2020-09-10 DIAGNOSIS — I10 Essential (primary) hypertension: Secondary | ICD-10-CM | POA: Diagnosis not present

## 2020-09-10 DIAGNOSIS — E782 Mixed hyperlipidemia: Secondary | ICD-10-CM

## 2020-09-11 LAB — CBC WITH DIFFERENTIAL/PLATELET
Absolute Monocytes: 511 cells/uL (ref 200–950)
Basophils Absolute: 88 cells/uL (ref 0–200)
Basophils Relative: 1.2 %
Eosinophils Absolute: 219 cells/uL (ref 15–500)
Eosinophils Relative: 3 %
HCT: 40.1 % (ref 35.0–45.0)
Hemoglobin: 13.6 g/dL (ref 11.7–15.5)
Lymphs Abs: 3088 cells/uL (ref 850–3900)
MCH: 29.3 pg (ref 27.0–33.0)
MCHC: 33.9 g/dL (ref 32.0–36.0)
MCV: 86.4 fL (ref 80.0–100.0)
MPV: 10.8 fL (ref 7.5–12.5)
Monocytes Relative: 7 %
Neutro Abs: 3395 cells/uL (ref 1500–7800)
Neutrophils Relative %: 46.5 %
Platelets: 342 10*3/uL (ref 140–400)
RBC: 4.64 10*6/uL (ref 3.80–5.10)
RDW: 12.5 % (ref 11.0–15.0)
Total Lymphocyte: 42.3 %
WBC: 7.3 10*3/uL (ref 3.8–10.8)

## 2020-09-11 LAB — COMPLETE METABOLIC PANEL WITH GFR
AG Ratio: 1.4 (calc) (ref 1.0–2.5)
ALT: 18 U/L (ref 6–29)
AST: 21 U/L (ref 10–35)
Albumin: 4.2 g/dL (ref 3.6–5.1)
Alkaline phosphatase (APISO): 107 U/L (ref 37–153)
BUN: 12 mg/dL (ref 7–25)
CO2: 28 mmol/L (ref 20–32)
Calcium: 9.4 mg/dL (ref 8.6–10.4)
Chloride: 102 mmol/L (ref 98–110)
Creat: 0.73 mg/dL (ref 0.50–0.99)
GFR, Est African American: 97 mL/min/{1.73_m2} (ref >=60)
GFR, Est Non African American: 84 mL/min/{1.73_m2} (ref >=60)
Globulin: 3.1 g/dL (calc) (ref 1.9–3.7)
Glucose, Bld: 83 mg/dL (ref 65–99)
Potassium: 4 mmol/L (ref 3.5–5.3)
Sodium: 140 mmol/L (ref 135–146)
Total Bilirubin: 0.4 mg/dL (ref 0.2–1.2)
Total Protein: 7.3 g/dL (ref 6.1–8.1)

## 2020-09-11 LAB — LIPID PANEL
Cholesterol: 229 mg/dL — ABNORMAL HIGH (ref <200)
HDL: 59 mg/dL (ref >=50)
LDL Cholesterol (Calc): 147 mg/dL (calc) — ABNORMAL HIGH
Non-HDL Cholesterol (Calc): 170 mg/dL (calc) — ABNORMAL HIGH (ref <130)
Total CHOL/HDL Ratio: 3.9 (calc) (ref <5.0)
Triglycerides: 115 mg/dL (ref <150)

## 2020-09-18 ENCOUNTER — Other Ambulatory Visit: Payer: Self-pay | Admitting: Family Medicine

## 2020-09-18 DIAGNOSIS — J4520 Mild intermittent asthma, uncomplicated: Secondary | ICD-10-CM

## 2020-10-03 ENCOUNTER — Ambulatory Visit (INDEPENDENT_AMBULATORY_CARE_PROVIDER_SITE_OTHER): Payer: Medicare Other | Admitting: Family Medicine

## 2020-10-03 ENCOUNTER — Encounter (INDEPENDENT_AMBULATORY_CARE_PROVIDER_SITE_OTHER): Payer: Self-pay | Admitting: *Deleted

## 2020-10-03 ENCOUNTER — Encounter: Payer: Self-pay | Admitting: Family Medicine

## 2020-10-03 ENCOUNTER — Other Ambulatory Visit: Payer: Self-pay

## 2020-10-03 VITALS — BP 152/90 | HR 92 | Temp 98.0°F | Resp 18 | Ht 68.0 in | Wt 206.8 lb

## 2020-10-03 DIAGNOSIS — I10 Essential (primary) hypertension: Secondary | ICD-10-CM

## 2020-10-03 DIAGNOSIS — Z1211 Encounter for screening for malignant neoplasm of colon: Secondary | ICD-10-CM | POA: Diagnosis not present

## 2020-10-03 DIAGNOSIS — E782 Mixed hyperlipidemia: Secondary | ICD-10-CM

## 2020-10-03 DIAGNOSIS — R252 Cramp and spasm: Secondary | ICD-10-CM

## 2020-10-03 DIAGNOSIS — J4520 Mild intermittent asthma, uncomplicated: Secondary | ICD-10-CM | POA: Diagnosis not present

## 2020-10-03 DIAGNOSIS — Z1272 Encounter for screening for malignant neoplasm of vagina: Secondary | ICD-10-CM | POA: Diagnosis not present

## 2020-10-03 MED ORDER — EZETIMIBE 10 MG PO TABS
10.0000 mg | ORAL_TABLET | Freq: Every day | ORAL | 3 refills | Status: DC
Start: 2020-10-03 — End: 2020-11-23

## 2020-10-03 MED ORDER — ALBUTEROL SULFATE HFA 108 (90 BASE) MCG/ACT IN AERS
INHALATION_SPRAY | RESPIRATORY_TRACT | 0 refills | Status: DC
Start: 2020-10-03 — End: 2020-11-13

## 2020-10-03 MED ORDER — FLOVENT HFA 110 MCG/ACT IN AERO: 2.0000 | INHALATION_SPRAY | Freq: Two times a day (BID) | RESPIRATORY_TRACT | 6 refills | Status: DC

## 2020-10-03 MED ORDER — TIZANIDINE HCL 4 MG PO TABS: 4.0000 mg | ORAL_TABLET | Freq: Three times a day (TID) | ORAL | 2 refills | Status: DC | PRN

## 2020-10-03 NOTE — Progress Notes (Signed)
   Subjective:    Patient ID: Mary Wise, female    DOB: September 15, 1950, 70 y.o.   MRN: [id number]  Patient presents for Follow-up (Medications )   Pt here to f/u chronic medical problems   she gets muscle cramps, but had to stop baclofen , she was on flexeril in the past, needs a new muscle relaxer      Asthma- taking flovent and has albuterol on hand    HTn no current medications, it does go up in the office ,feels good    HLD- taking zetia, she has been trying to change her diet   History of breast cancer, needs Colonoscopy and PAP per recommendations from oncology  Review Of Systems:  GEN- denies fatigue, fever, weight loss,weakness, recent illness HEENT- denies eye drainage, change in vision, nasal discharge, CVS- denies chest pain, palpitations RESP- denies SOB, cough, wheeze ABD- denies N/V, change in stools, abd pain GU- denies dysuria, hematuria, dribbling, incontinence MSK- denies joint pain,+ muscle aches, injury Neuro- denies headache, dizziness, syncope, seizure activity       Objective:    BP (!) 152/90   Pulse 92   Temp 98 F (36.7 C) (Temporal)   Resp 18   Ht 5\' 8"  (1.727 m)   Wt 206 lb 12.8 oz (93.8 kg)   SpO2 97%   BMI 31.44 kg/m  GEN- NAD, alert and oriented x3 HEENT- PERRL, EOMI, non injected sclera, pink conjunctiva, MMM, oropharynx clear Neck- Supple, no thyromegaly CVS- RRR, no murmur RESP-CTAB ABD-NABS,soft,NT,ND GU- normal external genitalia, vaginal mucosa pink with some atrophy, uterus absent, ovaries not palpated  EXT- No edema Pulses- Radial, DP- 2+        Assessment & Plan:   Reviewed recent fasting labs    Problem List Items Addressed This Visit      Unprioritized   Asthma    Controlled no changes       Relevant Medications   fluticasone (FLOVENT HFA) 110 MCG/ACT inhaler   albuterol (VENTOLIN HFA) 108 (90 Base) MCG/ACT inhaler   HTN, goal below 140/90 - Primary    Elevated, pt will check bp at home Goal less than 140  /90 No med started today Also reducing sugars/salt in diet       Relevant Medications   ezetimibe (ZETIA) 10 MG tablet   Hyperlipemia    Improved lipids on zetia Does not tolerate statins  LFT normal       Relevant Medications   ezetimibe (ZETIA) 10 MG tablet   Muscle cramps    Chronic spasm Trial of magnesium 400mg  once a day Will also give Zanaflex, has been on muscle relaxer's for many years        Other Visit Diagnoses    Colon cancer screening       Relevant Orders   Ambulatory referral to Gastroenterology   Screening for vaginal cancer       Relevant Orders   Pap IG w/ reflex to HPV when ASC-U      Note: This dictation was prepared with Dragon dictation along with smaller phrase technology. Any transcriptional errors that result from this process are unintentional.

## 2020-10-03 NOTE — Assessment & Plan Note (Signed)
Chronic spasm Trial of magnesium 400mg  once a day Will also give Zanaflex, has been on muscle relaxer's for many years

## 2020-10-03 NOTE — Assessment & Plan Note (Signed)
Improved lipids on zetia Does not tolerate statins  LFT normal

## 2020-10-03 NOTE — Patient Instructions (Addendum)
Try Magnesium 400mg  once a day for cramps Zanaflex for muscle spasm Check your blood pressure at home, goal  < 140/90  We will call with PAP smear results  F/U Mary Wise in 4 months

## 2020-10-03 NOTE — Assessment & Plan Note (Signed)
Elevated, pt will check bp at home Goal less than 140 /90 No med started today Also reducing sugars/salt in diet

## 2020-10-03 NOTE — Assessment & Plan Note (Signed)
Controlled no changes 

## 2020-10-04 LAB — PAP IG W/ RFLX HPV ASCU

## 2020-10-09 ENCOUNTER — Other Ambulatory Visit: Payer: Self-pay | Admitting: Family Medicine

## 2020-10-09 DIAGNOSIS — J4520 Mild intermittent asthma, uncomplicated: Secondary | ICD-10-CM

## 2020-10-15 ENCOUNTER — Encounter: Payer: Self-pay | Admitting: *Deleted

## 2020-11-07 ENCOUNTER — Other Ambulatory Visit: Payer: Self-pay | Admitting: Family Medicine

## 2020-11-08 ENCOUNTER — Telehealth: Payer: Self-pay | Admitting: Hematology

## 2020-11-08 NOTE — Telephone Encounter (Signed)
Spoke with patient she is aware

## 2020-11-13 ENCOUNTER — Other Ambulatory Visit: Payer: Self-pay | Admitting: Family Medicine

## 2020-11-13 DIAGNOSIS — J4520 Mild intermittent asthma, uncomplicated: Secondary | ICD-10-CM

## 2020-11-19 ENCOUNTER — Other Ambulatory Visit: Payer: Medicare Other

## 2020-11-19 ENCOUNTER — Ambulatory Visit: Payer: Medicare Other | Admitting: Hematology

## 2020-11-19 ENCOUNTER — Telehealth: Payer: Self-pay

## 2020-11-19 NOTE — Telephone Encounter (Signed)
Patient left voicemail to request call back to schedule appt. Left message on voicemail.

## 2020-11-21 NOTE — Progress Notes (Signed)
[place]   [phone] [phone]   Clinic Follow up Note   Patient Care Team: Pcp, No as PCP - General Truitt Merle, MD as Consulting Physician (Hematology) Eppie Gibson, MD as Attending Physician (Radiation Oncology) Alla Feeling, NP as Nurse Practitioner (Nurse Practitioner) Virl Cagey, MD as Consulting Physician (General Surgery) Daneil Dolin, MD as Consulting Physician (Gastroenterology)  Date of Service:  11/23/2020  CHIEF COMPLAINT: F/u triple negativeleftbreast cancer  SUMMARY OF ONCOLOGIC HISTORY: Oncology History Overview Note  Cancer Staging Malignant neoplasm of upper-outer quadrant of left breast in female, estrogen receptor negative ([place]) Staging form: Breast, AJCC 8th Edition - Pathologic stage from 08/19/2017: Stage IIA (pT2, pN0(sn), cM0, G3, ER: Negative, PR: Negative, HER2: Negative) - Signed by Ardath Sax, MD on 09/01/2017     Malignant neoplasm of upper-outer quadrant of left breast in female, estrogen receptor negative ([place])  06/16/2017 Initial Diagnosis   Malignant neoplasm of upper-outer quadrant of left breast in female: Initially presented with left deltoid mass which was consistent with lipoma.  During examination, a mass in the left breast was appreciated by palpation.   06/16/2017 Mammogram   BL Diagnostic Study: Irregular hyperdense mass in the upper outer quadrant of the left breast measuring 3.6 cm without radiographically detectable lymphadenopathy.  Normal appearance of the right breast.   06/16/2017 Breast US   3.2 cm irregular mass located at 2:30 o'clock, no pathological lymphadenopathy.   06/23/2017 Pathology Results   Ultrasound-guided biopsy: Positive for invasive ductal carcinoma, grade 3, ER 0%, PR 0%, HER-2 negative by [facility]   07/31/2017 Surgery   Partial left mastectomy with sentinel lymph node biopsy --Pathology: Invasive ductal carcinoma, measuring 4.5 cm, grade 3.  All  margins negative, but the anterior margin less than 0.1 cm.  No surrounding DCIS.  0/5 lymph nodes positive for malignancy   10/08/2017 PET scan   IMPRESSION: 1. No evidence breast cancer metastasis on FDG PET-CT scan. 2. Postsurgical change in the LEFT lateral breast and LEFT axilla.   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 Significance The following genes were evaluated for sequence changes and exonic deletions/duplications: ALK, APC, ATM, AXIN2, BAP1, BARD1, BLM, BMPR1A, BRCA1, BRCA2, BRIP1, CASR, CDC73, CDH1, CDK4, CDKN1B, CDKN1C, CDKN2A (p14ARF), CDKN2A (p16INK4a), CEBPA, CHEK2, CTNNA1, DICER1, DIS3L2, EPCAM*, FH, FLCN, GATA2, GPC3, GREM1*, HRAS, KIT, MAX, MEN1, MET, MLH1, MSH2, MSH3, MSH6, MUTYH, NBN, NF1, NF2, PALB2, PDGFRA, PHOX2B*, PMS2, POLD1, POLE, POT1, PRKAR1A, PTCH1, PTEN, RAD50, RAD51C, RAD51D, RB1, RECQL4, RET, RUNX1, SDHAF2, SDHB, SDHC, SDHD, SMAD4, SMARCA4, SMARCB1, SMARCE1, STK11, SUFU, TERC, TERT, TMEM127, TP53, TSC1, TSC2, VHL, WRN*, WT1 The following genes were evaluated for sequence changes only: EGFR*, HOXB13*, MITF*, NTHL1*, SDHA Results are negative unless otherwise indicated   05/27/2018 - 06/23/2018 Radiation Therapy   Radiation treatment dates:   05/27/2018 - 06/23/2018  Site/dose:    1. Left Breast / 40.05 Gy in 15 fractions 2. Left Breast Boost / 10 Gy in 5 fractions - Total dose of 50.05 Gy  Beams/energy:    1. 3D / 6X Photon 2. Isodose / 10X, 6X Photon  Narrative: The patient tolerated radiation treatment relatively well.  She experienced fatigue and some expected skin irritation with mild erythema and soreness to her breast/axilla. She is  applying Radiaplex twice daily as  directed. She also mentioned bilateral hand cramping with bruising. She was advised to discuss this with her PCP as it is not related to her radiation treatment.    Survivorship   Per Cira Rue, NP       CURRENT THERAPY:  Surveillance  INTERVAL HISTORY:  Mary Wise is here for a follow up of left breast cancer. She was last seen by me 6 months ago. He presents to the clinic with family member. She notes she has intermittent umbilical pain and slightly above her umbilicus. She notes this area will also swell at times. She notes she is overdue for her mammogram and her physicians has to request this. She notes she has not been able to get another colonoscopy because her GI feels she is high risk for bleeding, but she does not agree. She notes her Cologuard was 59. She notes having GI bleeding from her known polyps and hemorrhoids.  She notes feeling nodules on her breast for some time. She notes she marked this on breast. She notes she has called clinic about this but was not called back.    REVIEW OF SYSTEMS:   Constitutional: Denies fevers, chills or abnormal weight loss Eyes: Denies blurriness of vision Ears, nose, mouth, throat, and face: Denies mucositis or sore throat Respiratory: Denies cough, dyspnea or wheezes Cardiovascular: Denies palpitation, chest discomfort or lower extremity swelling Gastrointestinal:  Denies nausea, heartburn or change in bowel habits (+) Umbilical and epigastric pain.  Skin: Denies abnormal skin rashes Lymphatics: Denies new lymphadenopathy or easy bruising Neurological:Denies numbness, tingling or new weaknesses Behavioral/Psych: Mood is stable, no new changes  Breast: (+) right breast tenderness All other systems were reviewed with the patient and are negative.  MEDICAL HISTORY:  Past Medical History:  Diagnosis Date  . Anxiety   . Asthma   . Cancer [facility])    left breast cancer  . Family history of cancer   . Genetic testing 02/01/2018    Multi-Cancer panel (83 genes) @ [facility] - No pathogenic mutations detected  . Hemorrhoids   . History of radiation therapy 05/27/18- 06/23/18   Left Breast 40.05 Gy in 15 fractions, left breast boost 10 Gy in 5 fractions.   . Lipoma of left shoulder   . Migraine   . Migraine   . Osteopenia   . 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 ABDOMINAL HYSTERECTOMY    . APPENDECTOMY    . CESAREAN SECTION    . CHOLECYSTECTOMY    . ERCP    . IR FLUORO GUIDE PORT INSERTION RIGHT  10/05/2017  . IR REMOVAL TUN ACCESS W/ PORT W/O FL MOD SED  05/13/2018  . IR US GUIDE VASC ACCESS RIGHT  10/05/2017  . LIPOMA EXCISION Left 03/16/2018   left shoulder  . LIVER SURGERY    . MASS EXCISION N/A 03/30/2020   Procedure: EXCISION OF SEBACEOUS CYST ON LEFT NECK, SEBACEOUSE CYST ON BACK AND ATYPICAL MOLE ON RIGHT NECK, AND LEFT AXILLA SKIN TAG;  Surgeon: Virl Cagey, MD;  Location: [facility];  Service: General;  Laterality: N/A;  . PARTIAL MASTECTOMY WITH AXILLARY SENTINEL LYMPH NODE BIOPSY Left 07/31/2017   Procedure: LEFT PARTIAL MASTECTOMY WITH AXILLARY SENTINEL LYMPH NODE BIOPSY;  Surgeon: Virl Cagey, MD;  Location: [facility];  Service: General;  Laterality: Left;  . TOTAL KNEE ARTHROPLASTY Left 07/05/2019  Procedure: TOTAL KNEE ARTHROPLASTY;  Surgeon: Renette Butters, MD;  Location: [facility];  Service: Orthopedics;  Laterality: Left;    I have reviewed the social history and family history with the patient and they are unchanged from previous note.  ALLERGIES:  is allergic to onion, penicillins, sulfa antibiotics, yellow dye, tape, clindamycin/lincomycin, and lipitor [atorvastatin calcium].  MEDICATIONS:  Current Outpatient Medications  Medication Sig Dispense Refill  . albuterol (PROVENTIL) (2.5 MG/3ML) 0.083% nebulizer solution Take 3 mLs (2.5 mg total) by nebulization every 6 (six) hours as needed for wheezing  or shortness of breath. 90 mL 3  . albuterol (VENTOLIN HFA) 108 (90 Base) MCG/ACT inhaler INHALE 2 PUFFS BY MOUTH EVERY 6 HOURS AS NEEDED FOR WHEEZING FOR SHORTNESS OF BREATH 18 g 0  . ALPRAZolam (XANAX) 1 MG tablet Take 1 tablet by mouth twice daily as needed for anxiety 60 tablet 3  . ezetimibe (ZETIA) 10 MG tablet Take 1 tablet (10 mg total) by mouth daily. 30 tablet 0  . fluticasone (FLOVENT HFA) 110 MCG/ACT inhaler Inhale 2 puffs into the lungs in the morning and at bedtime. 1 each 6  . ibuprofen (ADVIL) 800 MG tablet Take 1 tablet (800 mg total) by mouth every 8 (eight) hours as needed. 30 tablet 3  . omeprazole (PRILOSEC) 20 MG capsule TAKE 1 CAPSULE BY MOUTH ONCE DAILY 30 DAYS FOR GASTROPROTECTION WHILE TAKING NSAIDS 90 capsule 0  . OVER THE COUNTER MEDICATION OmegaXL    . OVER THE COUNTER MEDICATION Nervive    . SUMAtriptan (IMITREX) 100 MG tablet TAKE 1 TABLET BY MOUTH AT ONSET OF MIGRAINE, IF SYMPTOMS PERSIST 2 HOURS LATER THEN TAKE AN ADDITIONAL DOSE. MAX OF 2 TABLETS PER 24 HOURS 9 tablet 0  . tiZANidine (ZANAFLEX) 4 MG tablet Take 1 tablet (4 mg total) by mouth every 8 (eight) hours as needed for muscle spasms. 60 tablet 2  . zolpidem (AMBIEN) 10 MG tablet TAKE 1 TABLET BY MOUTH AT BEDTIME AS NEEDED FOR SLEEP 30 tablet 3   No current facility-administered medications for this visit.    PHYSICAL EXAMINATION: ECOG PERFORMANCE STATUS: 1 - Symptomatic but completely ambulatory  Vitals:   11/23/20 1524  BP: (!) 160/88  Pulse: 88  Resp: 20  Temp: 97.9 F (36.6 C)  SpO2: 99%   Filed Weights   11/23/20 1524  Weight: 205 lb 14.4 oz (93.4 kg)    GENERAL:alert, no distress and comfortable SKIN: skin color, texture, turgor are normal, no rashes or significant lesions EYES: normal, Conjunctiva are pink and non-injected, sclera clear  NECK: supple, thyroid normal size, non-tender, without nodularity LYMPH:  no palpable lymphadenopathy in the cervical, axillary  LUNGS: clear to  auscultation and percussion with normal breathing effort HEART: regular rate & rhythm and no murmurs and no lower extremity edema ABDOMEN:abdomen soft, non-tender and normal bowel sounds (+) Significant epigastric tenderness Musculoskeletal:no cyanosis of digits and no clubbing  NEURO: alert & oriented x 3 with fluent speech, no focal motor/sensory deficits BREAST: s/p partial left mastectomy: surgical incision healed well. (+) Significant tenderness of right breast. No palpable mass, nodules or adenopathy bilaterally. Breast exam benign.   LABORATORY DATA:  I have reviewed the data as listed CBC Latest Ref Rng & Units 11/23/2020 09/10/2020 05/21/2020  WBC 4.0 - 10.5 K/uL 7.1 7.3 7.5  Hemoglobin 12.0 - 15.0 g/dL 13.1 13.6 13.1  Hematocrit 36.0 - 46.0 % 40.1 40.1 39.7  Platelets 150 - 400 K/uL 301 342 327  CMP Latest Ref Rng & Units 11/23/2020 09/10/2020 05/21/2020  Glucose 70 - 99 mg/dL 90 83 83  BUN 8 - 23 mg/dL '10 12 14  ' Creatinine 0.44 - 1.00 mg/dL 0.81 0.73 0.82  Sodium 135 - 145 mmol/L 140 140 139  Potassium 3.5 - 5.1 mmol/L 3.8 4.0 3.7  Chloride 98 - 111 mmol/L 101 102 103  CO2 22 - 32 mmol/L 30 28 32  Calcium 8.9 - 10.3 mg/dL 9.4 9.4 9.8  Total Protein 6.5 - 8.1 g/dL 8.0 7.3 7.7  Total Bilirubin 0.3 - 1.2 mg/dL 0.4 0.4 0.4  Alkaline Phos 38 - 126 U/L 114 - 98  AST 15 - 41 U/L '20 21 18  ' ALT 0 - 44 U/L '15 18 17      ' RADIOGRAPHIC STUDIES: I have personally reviewed the radiological images as listed and agreed with the findings in the report. No results found.   ASSESSMENT & PLAN:  Mary Wise is a 70 y.o. female with    1. 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 Proliferation marker Ki67 at 90% -She was diagnosed in 06/2017. She is s/p partial left mastectomy, Adjuvant chemo AC-T and adjuvant radiation. -From a breast cancer standpoint, she is clinically doing well. Lab reviewed, her CBC and  CMP are within normal limits. Her physical exam showed no palpable breast mass or adenopathy but her right breast is very tender without sign of infection. She also has significant tenderness of her epigastric region. I will order CT [facility] with contrast for evaluation  -She is over 3 years since her cancer diagnosis. Her risk of recurrence will significantly decrease after 3 years. Continue 5 year surveillance plan. Next Mammogram in 12/2020.  -f/u in 6 months    2. Right breast tenderness/pain  -Patient feels her right breast has nodules and overall very tender for several months. However, on exam this appears to be normal breast tissue (11/23/20). She does have significant tenderness of her right breast. These symptoms are not present of her left breast.  -She will proceed with Mammogram soon as she is due for imaging.    3. Epigastric pain, GI bleeding  -She notes recent pain around umbilicus and epigastric area. She notes this will occasionally swell and become hot. She attributes this to a hernia (she is s/p abdominal surgery) -She has significant epigastric tenderness on exam, no organomegaly (11/23/20). I discussed this degree of tenderness is not usually attributed to hernia. I recommend CT [facility] for further evaluation. She is agreeable.  -She also notes GI bleeding which she attributes to known polyps and hemorrhoids. Her prior cologuard was in 59-60 range. She is interested in colonoscopy but her current GI does not recommend. She is open to second opinion, I will refer her to [place]. She is agreeable.    4. Anxiety, insomnia -controlled on xanax and ambien PRN. She still only able to get 4 hours of sleep, manageable.   5. Osteopenia -last DEXA In 02/2019 shows osteopenia at the AP spine (T-score -1.1)  -Continue Vit D and exercise.   6. Genetictesting was negative  7. Arthralgia and back pain -Controlled with baclofen, previously on flexeril but lost its efficacy  -s/p left knee  replacement. She is followed by Dr. Fredonia Highland  Plan -Refer to [facility] for colonoscopy  -CT AP W contrast in 1-2 weeks. I will call her with results.    No problem-specific Assessment &  Plan notes found for this encounter.   Orders Placed This Encounter  Procedures  . MM DIAG BREAST TOMO BILATERAL    Standing Status:   Future    Standing Expiration Date:   11/23/2021    Order Specific Question:   Reason for Exam (SYMPTOM  OR DIAGNOSIS REQUIRED)    Answer:   screening    Order Specific Question:   Preferred imaging location?    Answer:   [facility]  . CT Abdomen Pelvis W Contrast    Mid abdominal pain, tenderness and bloating, rule out metastatic cancer or hernia    Standing Status:   Future    Standing Expiration Date:   11/23/2021    Order Specific Question:   If indicated for the ordered procedure, I authorize the administration of contrast media per Radiology protocol    Answer:   Yes    Order Specific Question:   Preferred imaging location?    Answer:   [facility]    Order Specific Question:   Release to patient    Answer:   Immediate    Order Specific Question:   Is Oral Contrast requested for this exam?    Answer:   Yes, Per Radiology protocol   All questions were answered. The patient knows to call the clinic with any problems, questions or concerns. No barriers to learning was detected. The total time spent in the appointment was 30 minutes.     Truitt Merle, MD 11/23/2020   I, Joslyn Devon, am acting as scribe for Truitt Merle, MD.   I have reviewed the above documentation for accuracy and completeness, and I agree with the above.

## 2020-11-22 ENCOUNTER — Telehealth: Payer: Self-pay | Admitting: Family Medicine

## 2020-11-22 DIAGNOSIS — E782 Mixed hyperlipidemia: Secondary | ICD-10-CM

## 2020-11-22 NOTE — Telephone Encounter (Signed)
Pt called needing a refill on all of her meds, she currently has an appt with the [facility] in July, but will need these meds refilled before that appt. Pt has also stated that she has decided that she will no longer take this med  ezetimibe (ZETIA) 10 MG tablet [[id number]]   Please call [phone]

## 2020-11-23 ENCOUNTER — Inpatient Hospital Stay (HOSPITAL_BASED_OUTPATIENT_CLINIC_OR_DEPARTMENT_OTHER): Payer: Medicare Other | Admitting: Hematology

## 2020-11-23 ENCOUNTER — Other Ambulatory Visit: Payer: Self-pay

## 2020-11-23 ENCOUNTER — Inpatient Hospital Stay: Payer: Medicare Other | Attending: Hematology

## 2020-11-23 VITALS — BP 160/88 | HR 88 | Temp 97.9°F | Resp 20 | Ht 68.0 in | Wt 205.9 lb

## 2020-11-23 DIAGNOSIS — G4700 Insomnia, unspecified: Secondary | ICD-10-CM | POA: Diagnosis not present

## 2020-11-23 DIAGNOSIS — F419 Anxiety disorder, unspecified: Secondary | ICD-10-CM | POA: Insufficient documentation

## 2020-11-23 DIAGNOSIS — Z171 Estrogen receptor negative status [ER-]: Secondary | ICD-10-CM | POA: Diagnosis not present

## 2020-11-23 DIAGNOSIS — Z923 Personal history of irradiation: Secondary | ICD-10-CM | POA: Insufficient documentation

## 2020-11-23 DIAGNOSIS — C50412 Malignant neoplasm of upper-outer quadrant of left female breast: Secondary | ICD-10-CM

## 2020-11-23 DIAGNOSIS — Z96652 Presence of left artificial knee joint: Secondary | ICD-10-CM | POA: Insufficient documentation

## 2020-11-23 DIAGNOSIS — R1013 Epigastric pain: Secondary | ICD-10-CM | POA: Diagnosis not present

## 2020-11-23 DIAGNOSIS — M8580 Other specified disorders of bone density and structure, unspecified site: Secondary | ICD-10-CM | POA: Diagnosis not present

## 2020-11-23 DIAGNOSIS — N644 Mastodynia: Secondary | ICD-10-CM | POA: Insufficient documentation

## 2020-11-23 DIAGNOSIS — Z9221 Personal history of antineoplastic chemotherapy: Secondary | ICD-10-CM | POA: Diagnosis not present

## 2020-11-23 LAB — CMP (CANCER CENTER ONLY)
ALT: 15 U/L (ref 0–44)
AST: 20 U/L (ref 15–41)
Albumin: 4 g/dL (ref 3.5–5.0)
Alkaline Phosphatase: 114 U/L (ref 38–126)
Anion gap: 9 (ref 5–15)
BUN: 10 mg/dL (ref 8–23)
CO2: 30 mmol/L (ref 22–32)
Calcium: 9.4 mg/dL (ref 8.9–10.3)
Chloride: 101 mmol/L (ref 98–111)
Creatinine: 0.81 mg/dL (ref 0.44–1.00)
GFR, Estimated: >60 mL/min (ref >60)
Glucose, Bld: 90 mg/dL (ref 70–99)
Potassium: 3.8 mmol/L (ref 3.5–5.1)
Sodium: 140 mmol/L (ref 135–145)
Total Bilirubin: 0.4 mg/dL (ref 0.3–1.2)
Total Protein: 8 g/dL (ref 6.5–8.1)

## 2020-11-23 LAB — CBC WITH DIFFERENTIAL (CANCER CENTER ONLY)
Abs Immature Granulocytes: 0.01 10*3/uL (ref 0.00–0.07)
Basophils Absolute: 0.1 10*3/uL (ref 0.0–0.1)
Basophils Relative: 1 %
Eosinophils Absolute: 0.2 10*3/uL (ref 0.0–0.5)
Eosinophils Relative: 3 %
HCT: 40.1 % (ref 36.0–46.0)
Hemoglobin: 13.1 g/dL (ref 12.0–15.0)
Immature Granulocytes: 0 %
Lymphocytes Relative: 47 %
Lymphs Abs: 3.3 10*3/uL (ref 0.7–4.0)
MCH: 28.5 pg (ref 26.0–34.0)
MCHC: 32.7 g/dL (ref 30.0–36.0)
MCV: 87.4 fL (ref 80.0–100.0)
Monocytes Absolute: 0.5 10*3/uL (ref 0.1–1.0)
Monocytes Relative: 7 %
Neutro Abs: 3 10*3/uL (ref 1.7–7.7)
Neutrophils Relative %: 42 %
Platelet Count: 301 10*3/uL (ref 150–400)
RBC: 4.59 MIL/uL (ref 3.87–5.11)
RDW: 13 % (ref 11.5–15.5)
WBC Count: 7.1 10*3/uL (ref 4.0–10.5)
nRBC: 0 % (ref 0.0–0.2)

## 2020-11-23 MED ORDER — EZETIMIBE 10 MG PO TABS: 10.0000 mg | ORAL_TABLET | Freq: Every day | ORAL | 0 refills | Status: DC

## 2020-11-25 ENCOUNTER — Encounter: Payer: Self-pay | Admitting: Hematology

## 2020-12-17 ENCOUNTER — Telehealth: Payer: Self-pay

## 2020-12-17 DIAGNOSIS — J4520 Mild intermittent asthma, uncomplicated: Secondary | ICD-10-CM

## 2020-12-17 MED ORDER — SUMATRIPTAN SUCCINATE 100 MG PO TABS: ORAL_TABLET | ORAL | 0 refills | Status: AC

## 2020-12-17 MED ORDER — ALBUTEROL SULFATE HFA 108 (90 BASE) MCG/ACT IN AERS: INHALATION_SPRAY | RESPIRATORY_TRACT | 0 refills | Status: AC

## 2020-12-17 NOTE — Telephone Encounter (Signed)
Prescription sent to pharmacy.

## 2020-12-17 NOTE — Telephone Encounter (Signed)
Pt called stating she needs a refill of albuterol (VENTOLIN HFA) 108 (90 Base) MCG/ACT inhaler  SUMAtriptan (IMITREX) 100 MG tablet    Pt asks if she can get enough refills to last until her appt with NP in July.  Cb#: [phone]

## 2020-12-18 ENCOUNTER — Other Ambulatory Visit (INDEPENDENT_AMBULATORY_CARE_PROVIDER_SITE_OTHER): Payer: Self-pay

## 2020-12-18 ENCOUNTER — Telehealth (INDEPENDENT_AMBULATORY_CARE_PROVIDER_SITE_OTHER): Payer: Self-pay

## 2020-12-18 ENCOUNTER — Encounter (INDEPENDENT_AMBULATORY_CARE_PROVIDER_SITE_OTHER): Payer: Self-pay

## 2020-12-18 DIAGNOSIS — Z1211 Encounter for screening for malignant neoplasm of colon: Secondary | ICD-10-CM

## 2020-12-18 MED ORDER — PEG 3350-KCL-NA BICARB-NACL 420 G PO SOLR: 4000.0000 mL | ORAL | 0 refills | Status: DC

## 2020-12-18 NOTE — Telephone Encounter (Signed)
Ok to schedule.

## 2020-12-18 NOTE — Telephone Encounter (Signed)
Referring MD/PCP: [facility]  Procedure: Tcs   Reason/Indication:  Screening  Has patient had this procedure before?  no  If so, when, by whom and where?    Is there a family history of colon cancer?  no  Who?  What age when diagnosed?    Is patient diabetic? If yes, Type 1 or Type 2   no      Does patient have prosthetic heart valve or mechanical valve?  no  Do you have a pacemaker/defibrillator?  no  Has patient ever had endocarditis/atrial fibrillation? no  Have you had a stroke/heart attack last 6 mths? no  Does patient use oxygen? no  Has patient had joint replacement within last 12 months?  no  Is patient constipated or do they take laxatives? Yes, constipation  Does patient have a history of alcohol/drug use?  no  Is patient on blood thinner such as Coumadin, Plavix and/or Aspirin? no  Do you take medicine for weight loss?  no  For female patients,: do you still have your menstrual cycle? no  Medications: Albut Nebs 2 puffs, Omeprazole 20 mg daily, Ambien 10 mg nightly, Xanax 10 mg bid, Flovent 2 puffs, Sumatriptan 100 mg prn, Albut Inhaler prn, Omega 300 mg daily, Pumpkin Seed Oil daily, tizanidine 4 mg every 8 hrs   Allergies: PCN, Sulfur, Yellow Dyes  Medication Adjustment per Dr Rehman/Dr Jenetta Downer none  Procedure date & time: 01/15/21 10:00

## 2020-12-18 NOTE — Telephone Encounter (Signed)
LeighAnn Nyonna Hargrove, CMA  

## 2020-12-19 ENCOUNTER — Other Ambulatory Visit: Payer: Self-pay | Admitting: Hematology

## 2020-12-19 ENCOUNTER — Other Ambulatory Visit: Payer: Self-pay

## 2020-12-19 ENCOUNTER — Ambulatory Visit
Admission: RE | Admit: 2020-12-19 | Discharge: 2020-12-19 | Disposition: A | Discharge status: Home / Self Care | Payer: Medicare Other | Source: Ambulatory Visit | Attending: Hematology | Admitting: Hematology

## 2020-12-19 DIAGNOSIS — N6310 Unspecified lump in the right breast, unspecified quadrant: Secondary | ICD-10-CM

## 2020-12-19 DIAGNOSIS — Z171 Estrogen receptor negative status [ER-]: Secondary | ICD-10-CM

## 2020-12-19 DIAGNOSIS — N6311 Unspecified lump in the right breast, upper outer quadrant: Secondary | ICD-10-CM | POA: Diagnosis not present

## 2020-12-19 DIAGNOSIS — N6312 Unspecified lump in the right breast, upper inner quadrant: Secondary | ICD-10-CM | POA: Diagnosis not present

## 2020-12-19 DIAGNOSIS — C50412 Malignant neoplasm of upper-outer quadrant of left female breast: Secondary | ICD-10-CM

## 2020-12-19 DIAGNOSIS — R928 Other abnormal and inconclusive findings on diagnostic imaging of breast: Secondary | ICD-10-CM | POA: Diagnosis not present

## 2020-12-21 ENCOUNTER — Other Ambulatory Visit: Payer: Self-pay

## 2020-12-21 ENCOUNTER — Ambulatory Visit (HOSPITAL_COMMUNITY)
Admission: RE | Admit: 2020-12-21 | Discharge: 2020-12-21 | Disposition: A | Discharge status: Home / Self Care | Payer: Medicare Other | Source: Ambulatory Visit | Attending: Hematology | Admitting: Hematology

## 2020-12-21 DIAGNOSIS — C50412 Malignant neoplasm of upper-outer quadrant of left female breast: Secondary | ICD-10-CM | POA: Insufficient documentation

## 2020-12-21 DIAGNOSIS — R1033 Periumbilical pain: Secondary | ICD-10-CM | POA: Diagnosis not present

## 2020-12-21 DIAGNOSIS — R140 Abdominal distension (gaseous): Secondary | ICD-10-CM | POA: Diagnosis not present

## 2020-12-21 DIAGNOSIS — Z171 Estrogen receptor negative status [ER-]: Secondary | ICD-10-CM | POA: Insufficient documentation

## 2020-12-21 DIAGNOSIS — Z853 Personal history of malignant neoplasm of breast: Secondary | ICD-10-CM | POA: Diagnosis not present

## 2020-12-21 DIAGNOSIS — K449 Diaphragmatic hernia without obstruction or gangrene: Secondary | ICD-10-CM | POA: Diagnosis not present

## 2020-12-21 MED ORDER — IOHEXOL 300 MG/ML  SOLN
75.0000 mL | Freq: Once | INTRAMUSCULAR | Status: AC | PRN
  Administered 2020-12-21: 75 mL via INTRAVENOUS

## 2020-12-27 ENCOUNTER — Telehealth: Payer: Self-pay

## 2020-12-27 NOTE — Telephone Encounter (Signed)
-----   Message from Truitt Merle, MD sent at 12/26/2020  7:23 AM EDT ----- Please let pt know her CT was unremarkable. I referred her to [facility] last time, make sure she has appointment. Please schedule lab and f/u in 4 months, thanks   Truitt Merle  12/26/2020

## 2020-12-27 NOTE — Telephone Encounter (Signed)
I spoke with Mary Wise and relayed Dr Ernestina Penna comments and recommendations.  A scheduling message has been sent.  She verbalized understanding.

## 2020-12-28 ENCOUNTER — Telehealth: Payer: Self-pay | Admitting: Hematology

## 2020-12-28 NOTE — Telephone Encounter (Signed)
Scheduled appts per 5/26 sch msg. Called pt, no answer. Left msg with appts date and times.

## 2021-01-08 ENCOUNTER — Other Ambulatory Visit: Payer: Self-pay

## 2021-01-08 ENCOUNTER — Other Ambulatory Visit (INDEPENDENT_AMBULATORY_CARE_PROVIDER_SITE_OTHER): Payer: Self-pay | Admitting: Gastroenterology

## 2021-01-08 ENCOUNTER — Telehealth (INDEPENDENT_AMBULATORY_CARE_PROVIDER_SITE_OTHER): Payer: Self-pay

## 2021-01-08 DIAGNOSIS — F419 Anxiety disorder, unspecified: Secondary | ICD-10-CM

## 2021-01-08 MED ORDER — ONDANSETRON HCL 4 MG PO TABS: 4.0000 mg | ORAL_TABLET | Freq: Once | ORAL | 0 refills | Status: AC

## 2021-01-08 NOTE — Telephone Encounter (Signed)
Hi, That is fine but will only send one pill as it can actually lead to constipation if she takes more than that. Thanks

## 2021-01-08 NOTE — Telephone Encounter (Signed)
Noted patient is aware 

## 2021-01-08 NOTE — Telephone Encounter (Signed)
Dr Nicoletta Dress is scheduled on  01/15/21 for Tcs and she is asking if we can send in to her pharmacy 4 mg Ondansetron she states the prep will make her nauseated and she would like to have it on hand, please advise?

## 2021-01-09 MED ORDER — TIZANIDINE HCL 4 MG PO TABS: 4.0000 mg | ORAL_TABLET | Freq: Three times a day (TID) | ORAL | 0 refills | Status: AC | PRN

## 2021-01-09 MED ORDER — ALPRAZOLAM 1 MG PO TABS: 1.0000 mg | ORAL_TABLET | Freq: Two times a day (BID) | ORAL | 0 refills | Status: DC | PRN

## 2021-01-09 MED ORDER — ZOLPIDEM TARTRATE 10 MG PO TABS: 10.0000 mg | ORAL_TABLET | Freq: Every evening | ORAL | 0 refills | Status: DC | PRN

## 2021-01-09 NOTE — Telephone Encounter (Signed)
PDMP reviewed.  Appears patient takes his medications chronically.  Will require appointment prior to future refills being given.

## 2021-01-14 ENCOUNTER — Other Ambulatory Visit (HOSPITAL_COMMUNITY): Payer: Medicare Other

## 2021-01-15 ENCOUNTER — Ambulatory Visit (HOSPITAL_COMMUNITY): Payer: Medicare Other | Admitting: Anesthesiology

## 2021-01-15 ENCOUNTER — Encounter (HOSPITAL_COMMUNITY): Payer: Self-pay | Admitting: Gastroenterology

## 2021-01-15 ENCOUNTER — Ambulatory Visit (HOSPITAL_COMMUNITY)
Admission: RE | Admit: 2021-01-15 | Discharge: 2021-01-15 | Disposition: A | Discharge status: Home / Self Care | Payer: Medicare Other | Attending: Gastroenterology | Admitting: Gastroenterology

## 2021-01-15 ENCOUNTER — Encounter (HOSPITAL_COMMUNITY)
Admission: RE | Disposition: A | Discharge status: Home / Self Care | Payer: Self-pay | Source: Home / Self Care | Attending: Gastroenterology

## 2021-01-15 ENCOUNTER — Other Ambulatory Visit: Payer: Self-pay

## 2021-01-15 DIAGNOSIS — Z881 Allergy status to other antibiotic agents status: Secondary | ICD-10-CM | POA: Diagnosis not present

## 2021-01-15 DIAGNOSIS — Z8669 Personal history of other diseases of the nervous system and sense organs: Secondary | ICD-10-CM | POA: Insufficient documentation

## 2021-01-15 DIAGNOSIS — Z888 Allergy status to other drugs, medicaments and biological substances status: Secondary | ICD-10-CM | POA: Diagnosis not present

## 2021-01-15 DIAGNOSIS — D175 Benign lipomatous neoplasm of intra-abdominal organs: Secondary | ICD-10-CM | POA: Diagnosis not present

## 2021-01-15 DIAGNOSIS — Z91040 Latex allergy status: Secondary | ICD-10-CM | POA: Diagnosis not present

## 2021-01-15 DIAGNOSIS — Z90710 Acquired absence of both cervix and uterus: Secondary | ICD-10-CM | POA: Diagnosis not present

## 2021-01-15 DIAGNOSIS — Z9049 Acquired absence of other specified parts of digestive tract: Secondary | ICD-10-CM | POA: Insufficient documentation

## 2021-01-15 DIAGNOSIS — Z880 Allergy status to penicillin: Secondary | ICD-10-CM | POA: Insufficient documentation

## 2021-01-15 DIAGNOSIS — K5730 Diverticulosis of large intestine without perforation or abscess without bleeding: Secondary | ICD-10-CM | POA: Diagnosis not present

## 2021-01-15 DIAGNOSIS — Z9012 Acquired absence of left breast and nipple: Secondary | ICD-10-CM | POA: Diagnosis not present

## 2021-01-15 DIAGNOSIS — D123 Benign neoplasm of transverse colon: Secondary | ICD-10-CM | POA: Insufficient documentation

## 2021-01-15 DIAGNOSIS — Z79899 Other long term (current) drug therapy: Secondary | ICD-10-CM | POA: Insufficient documentation

## 2021-01-15 DIAGNOSIS — Z96652 Presence of left artificial knee joint: Secondary | ICD-10-CM | POA: Insufficient documentation

## 2021-01-15 DIAGNOSIS — Z853 Personal history of malignant neoplasm of breast: Secondary | ICD-10-CM | POA: Insufficient documentation

## 2021-01-15 DIAGNOSIS — Z882 Allergy status to sulfonamides status: Secondary | ICD-10-CM | POA: Diagnosis not present

## 2021-01-15 DIAGNOSIS — Z1211 Encounter for screening for malignant neoplasm of colon: Secondary | ICD-10-CM | POA: Diagnosis not present

## 2021-01-15 DIAGNOSIS — K635 Polyp of colon: Secondary | ICD-10-CM | POA: Diagnosis not present

## 2021-01-15 DIAGNOSIS — F419 Anxiety disorder, unspecified: Secondary | ICD-10-CM | POA: Diagnosis not present

## 2021-01-15 HISTORY — PX: COLONOSCOPY WITH PROPOFOL: SHX5780

## 2021-01-15 HISTORY — PX: POLYPECTOMY: SHX5525

## 2021-01-15 LAB — HM COLONOSCOPY

## 2021-01-15 SURGERY — COLONOSCOPY WITH PROPOFOL
Anesthesia: General

## 2021-01-15 MED ORDER — PROPOFOL 10 MG/ML IV BOLUS
INTRAVENOUS | Status: DC | PRN
  Administered 2021-01-15: 60 mg via INTRAVENOUS
  Administered 2021-01-15: 100 mg via INTRAVENOUS
  Administered 2021-01-15: 50 mg via INTRAVENOUS
  Administered 2021-01-15: 40 mg via INTRAVENOUS
  Administered 2021-01-15 (×2): 100 mg via INTRAVENOUS
  Administered 2021-01-15 (×2): 50 mg via INTRAVENOUS

## 2021-01-15 MED ORDER — LACTATED RINGERS IV SOLN: INTRAVENOUS | Status: DC

## 2021-01-15 MED ORDER — STERILE WATER FOR IRRIGATION IR SOLN
Status: DC | PRN
  Administered 2021-01-15: 100 mL

## 2021-01-15 MED ORDER — LIDOCAINE HCL (CARDIAC) PF 50 MG/5ML IV SOSY
PREFILLED_SYRINGE | INTRAVENOUS | Status: DC | PRN
  Administered 2021-01-15: 50 mg via INTRAVENOUS

## 2021-01-15 NOTE — Anesthesia Postprocedure Evaluation (Signed)
Anesthesia Post Note  Patient: Mary Wise  Procedure(s) Performed: COLONOSCOPY WITH PROPOFOL POLYPECTOMY  Anesthesia Type: General Anesthetic complications: no   No notable events documented.   Last Vitals:  Vitals:   01/15/21 0826 01/15/21 1048  BP: (!) 169/89 115/74  Pulse:  74  Resp: 16 14  Temp: 36.8 C (!) 36.3 C  SpO2: 96% 98%    Last Pain:  Vitals:   01/15/21 1048  TempSrc: Oral  PainSc:                  Louann Sjogren

## 2021-01-15 NOTE — Transfer of Care (Signed)
Immediate Anesthesia Transfer of Care Note  Patient: Mary Wise  Procedure(s) Performed: COLONOSCOPY WITH PROPOFOL POLYPECTOMY  Patient Location: Endoscopy Unit  Anesthesia Type:General  Level of Consciousness: awake, alert  and patient cooperative  Airway & Oxygen Therapy: Patient Spontanous Breathing  Post-op Assessment: Report given to RN and Post -op Vital signs reviewed and stable  Post vital signs: Reviewed and stable  Last Vitals:  Vitals Value Taken Time  BP    Temp    Pulse    Resp    SpO2    SEE VITAL SIGN SHEET  Last Pain:  Vitals:   01/15/21 1004  TempSrc:   PainSc: 0-No pain      Patients Stated Pain Goal: 6 (07/27/81 5003)  Complications: No notable events documented.

## 2021-01-15 NOTE — Op Note (Signed)
[facility] Patient Name: Mary Wise Procedure Date: 01/15/2021 9:54 AM MRN: [id number] Date of Birth: 1950/08/18 Attending MD: Maylon Peppers ,  CSN: [id number] Age: 70 Admit Type: Outpatient Procedure:                Colonoscopy Indications:              Screening for colorectal malignant neoplasm Providers:                Maylon Peppers, Lambert Mody Raphael Gibney,                            Technician Referring MD:              Medicines:                Monitored Anesthesia Care Complications:            No immediate complications. Estimated Blood Loss:     Estimated blood loss: none. Procedure:                Pre-Anesthesia Assessment:                           - Prior to the procedure, a History and Physical                            was performed, and patient medications, allergies                            and sensitivities were reviewed. The patient's                            tolerance of previous anesthesia was reviewed.                           - The risks and benefits of the procedure and the                            sedation options and risks were discussed with the                            patient. All questions were answered and informed                            consent was obtained.                           - ASA Grade Assessment: II - A patient with mild                            systemic disease.                           After obtaining informed consent, the colonoscope                            was passed under direct vision. Throughout the  procedure, the patient's blood pressure, pulse, and                            oxygen saturations were monitored continuously. The                            PCF-[id number] ([id number]) was introduced through the                            anus and advanced to the the cecum, identified by                            appendiceal orifice and ileocecal valve. The                             colonoscopy was performed without difficulty. The                            patient tolerated the procedure well. The quality                            of the bowel preparation was adequate. Scope In: 10:09:57 AM Scope Out: 10:42:58 AM Scope Withdrawal Time: 0 hours 21 minutes 13 seconds  Total Procedure Duration: 0 hours 33 minutes 1 second  Findings:      The perianal and digital rectal examinations were normal.      A 8 mm polyp was found in the transverse colon. The polyp was sessile.       The polyp was removed with a hot snare. Resection and retrieval were       complete.      There was a medium-sized lipoma, in the proximal transverse colon.      Multiple small and large-mouthed diverticula were found in the sigmoid       colon, descending colon and ascending colon.      The retroflexed view of the distal rectum and anal verge was normal and       showed no anal or rectal abnormalities. Impression:               - One 8 mm polyp in the transverse colon, removed                            with a hot snare. Resected and retrieved.                           - Medium-sized lipoma in the proximal transverse                            colon.                           - Diverticulosis in the sigmoid colon, in the                            descending colon and in the ascending colon.                           -  The distal rectum and anal verge are normal on                            retroflexion view. Moderate Sedation:      Per Anesthesia Care Recommendation:           - Discharge patient to home (ambulatory).                           - Resume previous diet.                           - Await pathology results.                           - Repeat colonoscopy for surveillance based on                            pathology results. Procedure Code(s):        --- Professional ---                           [phone], Colonoscopy, flexible; with removal of                             tumor(s), polyp(s), or other lesion(s) by snare                            technique Diagnosis Code(s):        --- Professional ---                           Z12.11, Encounter for screening for malignant                            neoplasm of colon                           K63.5, Polyp of colon                           D17.5, Benign lipomatous neoplasm of                            intra-abdominal organs                           K57.30, Diverticulosis of large intestine without                            perforation or abscess without bleeding CPT copyright 2019 American [employer] Association. All rights reserved. The codes documented in this report are preliminary and upon coder review may  be revised to meet current compliance requirements. Maylon Peppers, MD Maylon Peppers,  01/15/2021 10:47:11 AM This report has been signed electronically. Number of Addenda: 0

## 2021-01-15 NOTE — H&P (Signed)
Mary Wise is an 70 y.o. female.   Chief Complaint: Screening colonoscopy HPI: 70 year old female with past medical history of asthma, left breast cancer status post radiation and mastectomy, multiple abdominal surgeries, seizures, anxiety, coming for screening colonoscopy. The patient has never had a colonoscopy in the past.  The patient denies having any complaints such as melena, hematochezia, abdominal  distention, change in her bowel movement consistency or frequency, no changes in her weight recently.  No family history of colorectal cancer.  The patient endorses having some discomfort in the epigastric area which she reports has been related to a hernia he has had for years but has been causing more discomfort recently. Past Medical History:  Diagnosis Date   Anxiety    Asthma    Cancer ([place])    left breast cancer   Family history of cancer    Genetic testing 02/01/2018   Multi-Cancer panel (83 genes) @ [facility] - No pathogenic mutations detected   Hemorrhoids    History of radiation therapy 05/27/18- 06/23/18   Left Breast 40.05 Gy in 15 fractions, left breast boost 10 Gy in 5 fractions.    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    Past Surgical History:  Procedure Laterality Date   ABDOMINAL HYSTERECTOMY     APPENDECTOMY     BREAST LUMPECTOMY Left 2018   CESAREAN SECTION     CHOLECYSTECTOMY     ERCP     IR FLUORO GUIDE PORT INSERTION RIGHT  10/05/2017   IR REMOVAL TUN ACCESS W/ PORT W/O FL MOD SED  05/13/2018   IR US GUIDE VASC ACCESS RIGHT  10/05/2017   LIPOMA EXCISION Left 03/16/2018   left shoulder   LIVER SURGERY     MASS EXCISION N/A 03/30/2020   Procedure: EXCISION OF SEBACEOUS CYST ON LEFT NECK, SEBACEOUSE CYST ON BACK AND ATYPICAL MOLE ON RIGHT NECK, AND LEFT AXILLA SKIN TAG;  Surgeon: Virl Cagey, MD;  Location: [facility];  Service: General;  Laterality: N/A;   PARTIAL MASTECTOMY  WITH AXILLARY SENTINEL LYMPH NODE BIOPSY Left 07/31/2017   Procedure: LEFT PARTIAL MASTECTOMY WITH AXILLARY SENTINEL LYMPH NODE BIOPSY;  Surgeon: Virl Cagey, MD;  Location: [facility];  Service: General;  Laterality: Left;   TOTAL KNEE ARTHROPLASTY Left 07/05/2019   Procedure: TOTAL KNEE ARTHROPLASTY;  Surgeon: Renette Butters, MD;  Location: [facility];  Service: Orthopedics;  Laterality: Left;    Family History  Problem Relation Age of Onset   Cancer Mother 71       unk. primary; deceased 32   Cancer Father 70       brain cancer; deceased 3   Ovarian cancer Sister 89       deceased 32   Diabetes Brother    Breast cancer Maternal Grandmother        dx 59s; deceased 89   Cancer Other        "female ca"   Diabetes Sister    Diabetes Sister    Diabetes Sister    Cancer Paternal Uncle        brain cancer    Cancer Paternal Grandmother        "female ca"   Cancer Paternal Grandfather        lung cancer   Cancer Paternal Uncle        brain tumor  Cancer Paternal Uncle        brain tumor    Stomach cancer Maternal Aunt 87       deceased 30   Cancer Paternal Aunt        ink. primary in 2 aunts   Melanoma Other    Lung cancer Maternal Aunt    Stomach cancer Cousin        2 sons of an unaffected maternal uncle   Social History:  reports that she has never smoked. She has never used smokeless tobacco. She reports that she does not drink alcohol and does not use drugs.  Allergies:  Allergies  Allergen Reactions   Onion Other (See Comments)    REACTION: results in coma state   Penicillins Hives, Shortness Of Breath and Swelling    Throat swelling Has patient had a PCN reaction causing immediate rash, facial/tongue/throat swelling, SOB or lightheadedness with hypotension: Yes Has patient had a PCN reaction causing severe rash involving mucus membranes or skin necrosis: No Has patient had a PCN reaction that required hospitalization: Yes Has patient had a PCN reaction  occurring within the last 10 years: No If all of the above answers are "NO", then may proceed with Cephalosporin use.    Sulfa Antibiotics Hives and Shortness Of Breath   Yellow Dye     REACTION: #5 and #9 "will kill me"   Tape Swelling    Pt states she had tape and Tegaderm used on her in [place]. This caused swelling to her face and neck. Wounds and bleeding from area tape/tegaderm was used.    Clindamycin/Lincomycin Hives and Nausea And Vomiting   Latex Itching   Lipitor [Atorvastatin Calcium] Hives    Medications Prior to Admission  Medication Sig Dispense Refill   albuterol (PROVENTIL) (2.5 MG/3ML) 0.083% nebulizer solution Take 3 mLs (2.5 mg total) by nebulization every 6 (six) hours as needed for wheezing or shortness of breath. 90 mL 3   albuterol (VENTOLIN HFA) 108 (90 Base) MCG/ACT inhaler INHALE 2 PUFFS BY MOUTH EVERY 6 HOURS AS NEEDED FOR WHEEZING FOR SHORTNESS OF BREATH (Patient taking differently: Inhale 2 puffs into the lungs every 6 (six) hours as needed for shortness of breath or wheezing.) 18 g 0   Alcaftadine (LASTACAFT) 0.25 % SOLN Place 1 drop into both eyes 2 (two) times daily as needed (irritated/dry eyes.).     ALPRAZolam (XANAX) 1 MG tablet Take 1 tablet (1 mg total) by mouth 2 (two) times daily as needed. for anxiety (Patient taking differently: Take 1 mg by mouth in the morning and at bedtime. for anxiety) 60 tablet 0   fluticasone (FLOVENT HFA) 110 MCG/ACT inhaler Inhale 2 puffs into the lungs in the morning and at bedtime. 1 each 6   Omega-3 Fatty Acids (OMEGA 3 PO) Take 1 capsule by mouth in the morning and at bedtime. Omega XL     omeprazole (PRILOSEC) 20 MG capsule TAKE 1 CAPSULE BY MOUTH ONCE DAILY 30 DAYS FOR GASTROPROTECTION WHILE TAKING NSAIDS (Patient taking differently: Take 20 mg by mouth in the morning. (0900)) 90 capsule 0   ondansetron (ZOFRAN) 4 MG tablet Take 4 mg by mouth every 8 (eight) hours as needed for nausea or vomiting.     Ophthalmic  Irrigation Solution (EYE [place] OP) Place 1 application into both eyes as needed (eye irritation).     SUMAtriptan (IMITREX) 100 MG tablet May repeat in 2 hours if headache persists or recurs. (Patient taking differently: Take 100  mg by mouth every 2 (two) hours as needed for migraine (May repeat in 2 hours if headache persists or recurs.).) 9 tablet 0   tiZANidine (ZANAFLEX) 4 MG tablet Take 1 tablet (4 mg total) by mouth every 8 (eight) hours as needed for muscle spasms. (Patient taking differently: Take 4 mg by mouth in the morning and at bedtime.) 60 tablet 0   zolpidem (AMBIEN) 10 MG tablet Take 1 tablet (10 mg total) by mouth at bedtime as needed. for sleep (Patient taking differently: Take 10 mg by mouth at bedtime.) 30 tablet 0   ezetimibe (ZETIA) 10 MG tablet Take 1 tablet (10 mg total) by mouth daily. (Patient not taking: Reported on 01/11/2021) 30 tablet 0   ibuprofen (ADVIL) 800 MG tablet Take 1 tablet (800 mg total) by mouth every 8 (eight) hours as needed. (Patient not taking: Reported on 01/11/2021) 30 tablet 3   polyethylene glycol-electrolytes (TRILYTE) 420 g solution Take 4,000 mLs by mouth as directed. 4000 mL 0    No results found for this or any previous visit (from the past 48 hour(s)). No results found.  Review of Systems  Constitutional: Negative.   HENT: Negative.    Eyes: Negative.   Respiratory: Negative.    Cardiovascular: Negative.   Gastrointestinal:  Positive for abdominal pain.  Endocrine: Negative.   Genitourinary: Negative.   Musculoskeletal: Negative.   Skin: Negative.   Allergic/Immunologic: Negative.   Neurological: Negative.   Hematological: Negative.   Psychiatric/Behavioral: Negative.     Blood pressure (!) 169/89, temperature 98.2 F (36.8 C), temperature source Oral, resp. rate 16, height 5\' 8"  (1.727 m), weight 88.9 kg, SpO2 96 %. Physical Exam  GENERAL: The patient is AO x3, in no acute distress. HEENT: Head is normocephalic and atraumatic. EOMI  are intact. Mouth is well hydrated and without lesions. NECK: Supple. No masses LUNGS: Clear to auscultation. No presence of rhonchi/wheezing/rales. Adequate chest expansion HEART: RRR, normal s1 and s2. ABDOMEN: mildly tender upon palpation of the epigastric area with a mass sensation in this area, no guarding, no peritoneal signs, and nondistended. BS +. No other masses. EXTREMITIES: Without any cyanosis, clubbing, rash, lesions or edema. NEUROLOGIC: AOx3, no focal motor deficit. SKIN: no jaundice, no rashes   Assessment/Plan 70 year old female with past medical history of asthma, left breast cancer status post radiation and mastectomy, multiple abdominal surgeries, seizures, anxiety, coming for screening colonoscopy. The patient is at average risk for colorectal cancer.  We will proceed with colonoscopy today.   Harvel Quale, MD 01/15/2021, 9:07 AM

## 2021-01-15 NOTE — Anesthesia Preprocedure Evaluation (Signed)
Anesthesia Evaluation  Patient identified by MRN, date of birth, ID band Patient awake    Reviewed: Allergy & Precautions, H&P , NPO status , Patient's Chart, lab work & pertinent test results, reviewed documented beta blocker date and time   Airway Mallampati: II  TM Distance: >3 FB Neck ROM: full    Dental no notable dental hx. (+) Teeth Intact   Pulmonary asthma ,    Pulmonary exam normal breath sounds clear to auscultation       Cardiovascular Exercise Tolerance: Good hypertension,  Rhythm:regular Rate:Normal     Neuro/Psych  Headaches, Seizures -,  PSYCHIATRIC DISORDERS Anxiety  Neuromuscular disease    GI/Hepatic negative GI ROS, Neg liver ROS,   Endo/Other  negative endocrine ROS  Renal/GU negative Renal ROS  negative genitourinary   Musculoskeletal   Abdominal   Peds  Hematology negative hematology ROS (+)   Anesthesia Other Findings   Reproductive/Obstetrics negative OB ROS                             Anesthesia Physical  Anesthesia Plan  ASA: 2  Anesthesia Plan: General   Post-op Pain Management:    Induction:   PONV Risk Score and Plan: Propofol infusion  Airway Management Planned:   Additional Equipment:   Intra-op Plan:   Post-operative Plan:   Informed Consent: I have reviewed the patients History and Physical, chart, labs and discussed the procedure including the risks, benefits and alternatives for the proposed anesthesia with the patient or authorized representative who has indicated his/her understanding and acceptance.     Dental Advisory Given  Plan Discussed with: CRNA  Anesthesia Plan Comments:         Anesthesia Quick Evaluation

## 2021-01-15 NOTE — Anesthesia Procedure Notes (Signed)
Date/Time: 01/15/2021 10:01 AM Performed by: Vista Deck, CRNA Pre-anesthesia Checklist: Patient identified, Emergency Drugs available, Suction available, Timeout performed and Patient being monitored Patient Re-evaluated:Patient Re-evaluated prior to induction Oxygen Delivery Method: Nasal Cannula

## 2021-01-15 NOTE — Discharge Instructions (Addendum)
You are being discharged to home.  Resume your previous diet.  We are waiting for your pathology results.  Your physician has recommended a repeat colonoscopy for surveillance based on pathology results.  

## 2021-01-16 LAB — SURGICAL PATHOLOGY

## 2021-01-22 ENCOUNTER — Encounter (INDEPENDENT_AMBULATORY_CARE_PROVIDER_SITE_OTHER): Payer: Self-pay | Admitting: *Deleted

## 2021-01-22 ENCOUNTER — Encounter (HOSPITAL_COMMUNITY): Payer: Self-pay | Admitting: Gastroenterology

## 2021-01-25 ENCOUNTER — Other Ambulatory Visit: Payer: Self-pay | Admitting: *Deleted

## 2021-01-25 MED ORDER — SIMVASTATIN 20 MG PO TABS: 20.0000 mg | ORAL_TABLET | ORAL | 3 refills | Status: DC

## 2021-02-06 ENCOUNTER — Other Ambulatory Visit: Payer: Self-pay

## 2021-02-06 ENCOUNTER — Ambulatory Visit (INDEPENDENT_AMBULATORY_CARE_PROVIDER_SITE_OTHER): Payer: Medicare Other | Admitting: Nurse Practitioner

## 2021-02-06 VITALS — BP 140/90 | HR 76 | Temp 98.7°F | Ht 68.0 in | Wt 209.0 lb

## 2021-02-06 DIAGNOSIS — J4520 Mild intermittent asthma, uncomplicated: Secondary | ICD-10-CM

## 2021-02-06 DIAGNOSIS — M8589 Other specified disorders of bone density and structure, multiple sites: Secondary | ICD-10-CM

## 2021-02-06 DIAGNOSIS — R1084 Generalized abdominal pain: Secondary | ICD-10-CM | POA: Diagnosis not present

## 2021-02-06 DIAGNOSIS — G43709 Chronic migraine without aura, not intractable, without status migrainosus: Secondary | ICD-10-CM | POA: Diagnosis not present

## 2021-02-06 DIAGNOSIS — Z79899 Other long term (current) drug therapy: Secondary | ICD-10-CM | POA: Diagnosis not present

## 2021-02-06 DIAGNOSIS — I10 Essential (primary) hypertension: Secondary | ICD-10-CM | POA: Diagnosis not present

## 2021-02-06 DIAGNOSIS — G4700 Insomnia, unspecified: Secondary | ICD-10-CM | POA: Diagnosis not present

## 2021-02-06 DIAGNOSIS — E782 Mixed hyperlipidemia: Secondary | ICD-10-CM

## 2021-02-06 DIAGNOSIS — F419 Anxiety disorder, unspecified: Secondary | ICD-10-CM

## 2021-02-06 NOTE — Progress Notes (Signed)
Subjective:    Patient ID: Mary Wise, female    DOB: [birth year], 70 y.o.   MRN: [id number]  HPI: Mary Wise is a 70 y.o. female presenting for follow-up.  Chief Complaint  Patient presents with   Follow-up    4 month follow up    Pain    From surgeries she had in the past. Dr Buelah Manis believes she has a hernia    Patient reports she has been stable on the same medications for many years.  She states she needs a primary care provider who will understand that she knows her body and knows what she needs.  ASTHMA Taking flovent 2 puffs 2 times daily.  Taking either albuterol nebulizer or inhaler at least once daily. Asthma status: "horrible" Satisfied with current treatment?: no Albuterol/rescue inhaler frequency: 7/7 days  Dyspnea frequency: rarely Wheezing frequency: in the morning Cough frequency: rarely Nocturnal symptom frequency: never Limitation of activity: no Current upper respiratory symptoms: no Pneumovax: Up to Date Influenza: Not up to Date  HYPERTENSION / HYPERLIPIDEMIA Not taking simvastatin or ezetimibe.  Wondering if she really needs these.  Not currently taking anything for high cholesterol.  Checks blood pressure at home and is reportedly normal. Satisfied with current treatment? yes Duration of hypertension: chronic BP monitoring frequency: daily BP range: 121-137/70-80 BP medication side effects: n/a Duration of hyperlipidemia: chronic Cholesterol medication side effects: yes Aspirin: no Recent stressors: no Recurrent headaches: no Visual changes: no Palpitations: no Dyspnea: no Chest pain: no Lower extremity edema: no Dizzy/lightheaded: no The 10-year ASCVD risk score Mikey Bussing DC Jr., et al., 2013) is: 10.4%   Values used to calculate the score:     Age: 61 years     Sex: Female     Is Non-Hispanic African American: No     Diabetic: No     Tobacco smoker: No     Systolic Blood Pressure: 371 mmHg     Is BP treated: No     HDL  Cholesterol: 59 mg/dL     Total Cholesterol: 229 mg/dL  ANXIETY/STRESS Currently taking alprazolam 1 mg 2 times daily for anxiety.  Also taking Ambien 10 mg daily at bedtime to help with her sleep.  She has been on these medications for many years.  Reports her mood is stable.  MIGRAINE Taking imitrex 100 mg as needed.  Migraines are not changing and are not worsening.  Allergies  Allergen Reactions   Onion Other (See Comments)    REACTION: results in coma state   Penicillins Hives, Shortness Of Breath and Swelling    Throat swelling Has patient had a PCN reaction causing immediate rash, facial/tongue/throat swelling, SOB or lightheadedness with hypotension: Yes Has patient had a PCN reaction causing severe rash involving mucus membranes or skin necrosis: No Has patient had a PCN reaction that required hospitalization: Yes Has patient had a PCN reaction occurring within the last 10 years: No If all of the above answers are "NO", then may proceed with Cephalosporin use.    Sulfa Antibiotics Hives and Shortness Of Breath   Yellow Dye     REACTION: #5 and #9 "will kill me"   Tape Swelling    Pt states she had tape and Tegaderm used on her in [place]. This caused swelling to her face and neck. Wounds and bleeding from area tape/tegaderm was used.    Clindamycin/Lincomycin Hives and Nausea And Vomiting   Latex Itching   Lipitor [Atorvastatin Calcium] Hives  Outpatient Encounter Medications as of 02/06/2021  Medication Sig   albuterol (PROVENTIL) (2.5 MG/3ML) 0.083% nebulizer solution Take 3 mLs (2.5 mg total) by nebulization every 6 (six) hours as needed for wheezing or shortness of breath.   albuterol (VENTOLIN HFA) 108 (90 Base) MCG/ACT inhaler INHALE 2 PUFFS BY MOUTH EVERY 6 HOURS AS NEEDED FOR WHEEZING FOR SHORTNESS OF BREATH (Patient taking differently: Inhale 2 puffs into the lungs every 6 (six) hours as needed for shortness of breath or wheezing.)   Alcaftadine (LASTACAFT)  0.25 % SOLN Place 1 drop into both eyes 2 (two) times daily as needed (irritated/dry eyes.).   ALPRAZolam (XANAX) 1 MG tablet Take 1 tablet (1 mg total) by mouth 2 (two) times daily as needed. for anxiety (Patient taking differently: Take 1 mg by mouth in the morning and at bedtime. for anxiety)   fluticasone (FLOVENT HFA) 110 MCG/ACT inhaler Inhale 2 puffs into the lungs in the morning and at bedtime.   ibuprofen (ADVIL) 800 MG tablet Take 1 tablet (800 mg total) by mouth every 8 (eight) hours as needed. (Patient not taking: Reported on 01/11/2021)   omeprazole (PRILOSEC) 20 MG capsule TAKE 1 CAPSULE BY MOUTH ONCE DAILY 30 DAYS FOR GASTROPROTECTION WHILE TAKING NSAIDS (Patient taking differently: Take 20 mg by mouth in the morning. (0900))   ondansetron (ZOFRAN) 4 MG tablet Take 4 mg by mouth every 8 (eight) hours as needed for nausea or vomiting.   Ophthalmic Irrigation Solution (EYE [place] OP) Place 1 application into both eyes as needed (eye irritation).   simvastatin (ZOCOR) 20 MG tablet Take 1 tablet (20 mg total) by mouth 3 (three) times a week.   SUMAtriptan (IMITREX) 100 MG tablet May repeat in 2 hours if headache persists or recurs. (Patient taking differently: Take 100 mg by mouth every 2 (two) hours as needed for migraine (May repeat in 2 hours if headache persists or recurs.).)   tiZANidine (ZANAFLEX) 4 MG tablet Take 1 tablet (4 mg total) by mouth every 8 (eight) hours as needed for muscle spasms. (Patient taking differently: Take 4 mg by mouth in the morning and at bedtime.)   zolpidem (AMBIEN) 10 MG tablet Take 1 tablet (10 mg total) by mouth at bedtime as needed. for sleep (Patient taking differently: Take 10 mg by mouth at bedtime.)   [DISCONTINUED] ezetimibe (ZETIA) 10 MG tablet Take 1 tablet (10 mg total) by mouth daily. (Patient not taking: Reported on 01/11/2021)   [DISCONTINUED] Omega-3 Fatty Acids (OMEGA 3 PO) Take 1 capsule by mouth in the morning and at bedtime. Omega XL   No  facility-administered encounter medications on file as of 02/06/2021.    Patient Active Problem List   Diagnosis Date Noted   Wound dehiscence 04/12/2020   Atypical mole of neck 03/06/2020   Sebaceous cyst 02/16/2020   Skin tag 02/16/2020   Primary osteoarthritis of left knee 06/13/2019   OA (osteoarthritis) of knee 05/18/2019   Asthma 05/18/2019   Hyperlipemia 05/18/2019   Osteopenia 02/02/2019   Muscle cramps 01/10/2019   Peripheral neuropathy 10/11/2018   Migraines 02/01/2018   Insomnia 02/01/2018   Genetic testing 02/01/2018   Family history of cancer    Port-A-Cath in place 10/12/2017   Malignant neoplasm of upper-outer quadrant of left breast in female, estrogen receptor negative ([place])    HTN, goal below 140/90 07/01/2017   Anxiety 07/01/2017   Lipoma of left shoulder 06/19/2017   Left breast mass 06/19/2017    Past Medical History:  Diagnosis Date   Anxiety    Asthma    Cancer ([place])    left breast cancer   Family history of cancer    Genetic testing 02/01/2018   Multi-Cancer panel (83 genes) @ [facility] - No pathogenic mutations detected   Hemorrhoids    History of radiation therapy 05/27/18- 06/23/18   Left Breast 40.05 Gy in 15 fractions, left breast boost 10 Gy in 5 fractions.    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 Interim medical history since our last visit reviewed.  Review of Systems  Constitutional: Negative.   HENT: Negative.    Respiratory: Negative.    Cardiovascular: Negative.   Gastrointestinal:  Positive for abdominal pain (chronic). Negative for blood in stool, constipation, diarrhea, nausea and vomiting.  Genitourinary: Negative.   Musculoskeletal:  Positive for arthralgias (chronic knee pain).  Skin: Negative.   Neurological: Negative.   Psychiatric/Behavioral:   Positive for sleep disturbance. Negative for behavioral problems, confusion, decreased concentration and suicidal ideas. The patient is nervous/anxious.    Per HPI unless specifically indicated above     Objective:    BP 140/90   Pulse 76   Temp 98.7 F (37.1 C)   Ht 5\' 8"  (1.727 m)   Wt 209 lb (94.8 kg)   SpO2 97%   BMI 31.78 kg/m   Wt Readings from Last 3 Encounters:  02/06/21 209 lb (94.8 kg)  01/15/21 196 lb (88.9 kg)  11/23/20 205 lb 14.4 oz (93.4 kg)    Physical Exam Vitals and nursing note reviewed.  Constitutional:      Appearance: Normal appearance. She is obese.  HENT:     Head: Normocephalic and atraumatic.     Right Ear: External ear normal.     Left Ear: External ear normal.  Eyes:     General: No scleral icterus.       Right eye: No discharge.        Left eye: No discharge.     Extraocular Movements: Extraocular movements intact.     Pupils: Pupils are equal, round, and reactive to light.  Cardiovascular:     Rate and Rhythm: Normal rate and regular rhythm.  Pulmonary:     Effort: Pulmonary effort is normal. No respiratory distress.     Breath sounds: Normal breath sounds. No wheezing, rhonchi or rales.  Abdominal:     General: Bowel sounds are normal. There is no distension.     Palpations: Abdomen is soft.     Tenderness: There is abdominal tenderness. There is no right CVA tenderness or left CVA tenderness.  Musculoskeletal:        General: Normal range of motion.     Cervical back: Normal range of motion.     Right lower leg: No edema.     Left lower leg: No edema.  Lymphadenopathy:     Cervical: No cervical adenopathy.  Skin:    General: Skin is warm and dry.     Coloration: Skin is not jaundiced or pale.     Findings: No erythema.  Neurological:     Mental Status: She is alert and oriented to person, place, and time.     Motor: No weakness.     Gait: Gait normal.  Psychiatric:  Mood and Affect: Mood normal.        Behavior:  Behavior normal.        Thought Content: Thought content normal.        Judgment: Judgment normal.      Assessment & Plan:   Problem List Items Addressed This Visit       Cardiovascular and Mediastinum   Migraines    Chronic.  Stable with as needed Imitrex.  Continue for now-patient to let us know if migraine frequency or intensity changes.  Follow-up in 3 months.       HTN, goal below 140/90    Appears to be chronic, however question whitecoat syndrome given normal reported blood pressures at home.  Encouraged patient to continue to check blood pressure at home.  Try to reduce salt intake as well.  Plan to follow-up in 3 months.         Respiratory   Asthma    Chronic.  Discussed at length that nebulizer/rescue inhaler should not need to be used every day-this is only for shortness of breath or wheezing.  Encouraged patient to use rescue medications only as needed.  Continue Flovent 2 puffs 2 times daily.  Follow-up if cutting back on albuterol worsen symptoms.  Of note, patient reported she has done this for years and has always been stable.  Plan to follow-up in 3 months or sooner if needed.         Other   Insomnia    Chronic.  Has been on Ambien 10 mg daily every night for years.  I discussed that I am not comfortable prescribing this medication long-term for daily use due to side effects.  Options include slowly decreasing on this medication versus referral to psychiatry.  Patient is agreeable to referral to psychiatry.  PDMP reviewed.  Pt is aware of risks of psychoactive medication use to include increased sedation, respiratory suppression, falls, extrapyramidal movements,  dependence and cardiovascular events.  Pt would like to continue treatment as benefit determined to outweigh risk.  She is not to take this medication at the same time as Xanax.   We will plan to bridge medication until we can get her in with them.  Follow-up in 3 months.       Hyperlipemia    Chronic.   Discussed The 10-year ASCVD risk score Mikey Bussing DC Brooke Bonito., et al., 2013) is: 10.4%   Values used to calculate the score:     Age: 39 years     Sex: Female     Is Non-Hispanic African American: No     Diabetic: No     Tobacco smoker: No     Systolic Blood Pressure: 154 mmHg     Is BP treated: No     HDL Cholesterol: 59 mg/dL     Total Cholesterol: 229 mg/dL  we will restart simvastatin 3 times weekly and recheck lipids in 3 months.       Anxiety    Chronic.  Discussed at length benzodiazepine use.  I am not comfortable prescribing this medication for daily use and discussed options including cutting back on medication or referral to psychiatry.  Patient agreeable to referral to psychiatry.  Pt is aware of risks of psychoactive medication use to include increased sedation, respiratory suppression, falls, extrapyramidal movements,  dependence and cardiovascular events.  Pt would like to continue treatment as benefit determined to outweigh risk.  PDMP reviewed today.  She is not to take this medication at the same  time as Ambien.  We will plan to bridge alprazolam 1 mg until she can get in with psychiatry.  Follow-up in 3 months.       Other Visit Diagnoses     Generalized abdominal pain    -  Primary   Relevant Orders   Ambulatory referral to Gastroenterology   Long-term current use of high risk medication other than anticoagulant            Follow up plan: Return in about 3 months (around 05/09/2021).  40+ minutes spent with the patient today discussing chronic conditions and management of asthma, anxiety, and insomnia.

## 2021-02-08 ENCOUNTER — Encounter (INDEPENDENT_AMBULATORY_CARE_PROVIDER_SITE_OTHER): Payer: Self-pay | Admitting: *Deleted

## 2021-02-09 ENCOUNTER — Encounter: Payer: Self-pay | Admitting: Nurse Practitioner

## 2021-02-09 MED ORDER — ALPRAZOLAM 1 MG PO TABS: 1.0000 mg | ORAL_TABLET | Freq: Two times a day (BID) | ORAL | 0 refills | Status: AC | PRN

## 2021-02-09 MED ORDER — ZOLPIDEM TARTRATE 10 MG PO TABS: 10.0000 mg | ORAL_TABLET | Freq: Every evening | ORAL | 0 refills | Status: AC | PRN

## 2021-02-09 NOTE — Assessment & Plan Note (Addendum)
Chronic.  Discussed at length benzodiazepine use.  I am not comfortable prescribing this medication for daily use and discussed options including cutting back on medication or referral to psychiatry.  Patient agreeable to referral to psychiatry.  Pt is aware of risks of psychoactive medication use to include increased sedation, respiratory suppression, falls, extrapyramidal movements,  dependence and cardiovascular events.  Pt would like to continue treatment as benefit determined to outweigh risk.  PDMP reviewed today.  She is not to take this medication at the same time as Ambien.  We will plan to bridge alprazolam 1 mg until she can get in with psychiatry.  Follow-up in 3 months.

## 2021-02-09 NOTE — Assessment & Plan Note (Signed)
Chronic.  Has been on Ambien 10 mg daily every night for years.  I discussed that I am not comfortable prescribing this medication long-term for daily use due to side effects.  Options include slowly decreasing on this medication versus referral to psychiatry.  Patient is agreeable to referral to psychiatry.  PDMP reviewed.  Pt is aware of risks of psychoactive medication use to include increased sedation, respiratory suppression, falls, extrapyramidal movements,  dependence and cardiovascular events.  Pt would like to continue treatment as benefit determined to outweigh risk.  She is not to take this medication at the same time as Xanax.   We will plan to bridge medication until we can get her in with them.  Follow-up in 3 months.

## 2021-02-09 NOTE — Assessment & Plan Note (Addendum)
Chronic.  Has not been taking statin or zetia - will defer checking cholesterol levels for now.  Discussed The 10-year ASCVD risk score Mikey Bussing DC Brooke Bonito., et al., 2013) is: 10.4%   Values used to calculate the score:     Age: 70 years     Sex: Female     Is Non-Hispanic African American: No     Diabetic: No     Tobacco smoker: No     Systolic Blood Pressure: 301 mmHg     Is BP treated: No     HDL Cholesterol: 59 mg/dL     Total Cholesterol: 229 mg/dL  we will restart simvastatin 3 times weekly and recheck lipids in 3 months.

## 2021-02-09 NOTE — Assessment & Plan Note (Signed)
Appears to be chronic, however question whitecoat syndrome given normal reported blood pressures at home.  Encouraged patient to continue to check blood pressure at home.  Try to reduce salt intake as well.  Plan to follow-up in 3 months.

## 2021-02-09 NOTE — Assessment & Plan Note (Signed)
Chronic.  Stable with as needed Imitrex.  Continue for now-patient to let us know if migraine frequency or intensity changes.  Follow-up in 3 months.

## 2021-02-09 NOTE — Assessment & Plan Note (Signed)
Chronic.  Discussed at length that nebulizer/rescue inhaler should not need to be used every day-this is only for shortness of breath or wheezing.  Encouraged patient to use rescue medications only as needed.  Continue Flovent 2 puffs 2 times daily.  Follow-up if cutting back on albuterol worsen symptoms.  Of note, patient reported she has done this for years and has always been stable.  Plan to follow-up in 3 months or sooner if needed.

## 2021-02-11 ENCOUNTER — Other Ambulatory Visit: Payer: Medicare Other

## 2021-02-21 ENCOUNTER — Telehealth (INDEPENDENT_AMBULATORY_CARE_PROVIDER_SITE_OTHER): Payer: Self-pay

## 2021-02-21 NOTE — Telephone Encounter (Signed)
Mary Wise states that she has been vomiting for a month now she is having bad acid reflux her throat is on fire she is hoarse from throwing up the acid she states that she is taking omeprazole for reflux but not helping at all she states that in her nose and her mouth smells like a "rotten cabbage patch" and shes lost 6 lbs in a month

## 2021-02-21 NOTE — Telephone Encounter (Signed)
She has only seen [place] for a direct referral for screening colonoscopy, she will need an appointment to assess these issues. Please set her up in next available or in wait list for cancelations. Thanks

## 2021-02-22 NOTE — Telephone Encounter (Signed)
I left a detailed message to please call the office and make an appointment to assess these issues shes having

## 2021-03-11 DIAGNOSIS — G4700 Insomnia, unspecified: Secondary | ICD-10-CM | POA: Diagnosis not present

## 2021-03-11 DIAGNOSIS — E6609 Other obesity due to excess calories: Secondary | ICD-10-CM | POA: Diagnosis not present

## 2021-03-11 DIAGNOSIS — R030 Elevated blood-pressure reading, without diagnosis of hypertension: Secondary | ICD-10-CM | POA: Diagnosis not present

## 2021-03-11 DIAGNOSIS — Z853 Personal history of malignant neoplasm of breast: Secondary | ICD-10-CM | POA: Diagnosis not present

## 2021-03-26 ENCOUNTER — Other Ambulatory Visit (INDEPENDENT_AMBULATORY_CARE_PROVIDER_SITE_OTHER): Payer: Self-pay

## 2021-03-26 ENCOUNTER — Ambulatory Visit (INDEPENDENT_AMBULATORY_CARE_PROVIDER_SITE_OTHER): Payer: Medicare Other | Admitting: Internal Medicine

## 2021-03-26 ENCOUNTER — Other Ambulatory Visit: Payer: Self-pay

## 2021-03-26 ENCOUNTER — Encounter (INDEPENDENT_AMBULATORY_CARE_PROVIDER_SITE_OTHER): Payer: Self-pay | Admitting: Internal Medicine

## 2021-03-26 ENCOUNTER — Encounter (INDEPENDENT_AMBULATORY_CARE_PROVIDER_SITE_OTHER): Payer: Self-pay

## 2021-03-26 DIAGNOSIS — R109 Unspecified abdominal pain: Secondary | ICD-10-CM | POA: Diagnosis not present

## 2021-03-26 DIAGNOSIS — R1319 Other dysphagia: Secondary | ICD-10-CM

## 2021-03-26 DIAGNOSIS — K219 Gastro-esophageal reflux disease without esophagitis: Secondary | ICD-10-CM

## 2021-03-26 MED ORDER — OMEPRAZOLE 20 MG PO CPDR: 20.0000 mg | DELAYED_RELEASE_CAPSULE | Freq: Two times a day (BID) | ORAL | 3 refills | Status: DC

## 2021-03-26 MED ORDER — HYOSCYAMINE SULFATE 0.125 MG SL SUBL: 0.1250 mg | SUBLINGUAL_TABLET | Freq: Three times a day (TID) | SUBLINGUAL | 1 refills | Status: DC | PRN

## 2021-03-26 NOTE — H&P (View-Only) (Signed)
Presenting complaint;  Frequent heartburn despite taking medications, dysphagia and abdominal pain.  History of present illness  Patient is 70 year old Caucasian female who is here for scheduled visit.  While she has never been seen in the office before she did undergo screening colonoscopy by Dr. 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 Patient now presents with multiple complaints. She states she has had heartburn since she was a teenager.  She has been on omeprazole for the past few years.  She says it is not working anymore.  Sometimes she has heartburn on drinking few sips of water.  She is having daily heartburn which occurs at different times.  She also has nocturnal regurgitation.  She also experiences regurgitation when she stoops or bends forward or when she is getting up from her recliner.  This is sometimes associated with vomiting.  She has had 2-3 episodes of vomiting in the last 1 month.  No history of hematemesis or melena.  She also complains of soreness in the back of her tongue and throat off and on.  She ran out of omeprazole prescription and experience intractable heartburn.  She did not get much relief with Rolaids and Pepcid OTC.  She also complains of dysphagia with liquids and solids such as spaghetti.  She does not eat much meat.  Her appetite is good.  She has lost 4 pounds in the last few months but she states she has gained several pounds since she had chemo for breast carcinoma.  She received chemo from March 2019 through June 2019. Her third complaint is 1 of abdominal pain which starts in epigastric and radiates along the midline in the hypogastric region.  She describes this pain to be sharp.  She also complains of splashing noise in her abdomen when she eats or drinks.  She has had this pain for months but it has gotten worse  lately.  She did have abdominal pelvic CT about 3 months ago by her oncologist Dr. Burr Medico and no abnormality was noted to account for this pain. She is having as many as 7 stools per day.  Stools are mushy.  She denies rectal bleeding. \She remains with good appetite. She has left knee arthritis and migraine.  She takes ibuprofen 800 mg at least 3 times a week.  She says she has 9-11 episodes of migraine every month.  She is using [place] [employer] tracks.  Current Medications: Outpatient Encounter Medications as of 03/26/2021  Medication Sig   albuterol (PROVENTIL) (2.5 MG/3ML) 0.083% nebulizer solution Take 3 mLs (2.5 mg total) by nebulization every 6 (six) hours as needed for wheezing or shortness of breath.   albuterol (VENTOLIN HFA) 108 (90 Base) MCG/ACT inhaler INHALE 2 PUFFS BY MOUTH EVERY 6 HOURS AS NEEDED FOR WHEEZING FOR SHORTNESS OF BREATH (Patient taking differently: Inhale 2 puffs into the lungs every 6 (six) hours as needed for shortness of breath or wheezing.)   ALPRAZolam (XANAX) 1 MG tablet Take 1 tablet (1 mg total) by mouth 2 (two) times daily as needed. for anxiety   fluticasone (FLOVENT HFA) 110 MCG/ACT inhaler Inhale 2 puffs into the lungs in the morning and at bedtime.   ibuprofen (ADVIL) 800 MG tablet Take 1 tablet (800 mg total) by mouth every 8 (eight) hours as needed.   omeprazole (PRILOSEC) 20 MG capsule TAKE 1 CAPSULE BY MOUTH ONCE DAILY 30 DAYS FOR GASTROPROTECTION  WHILE TAKING NSAIDS (Patient taking differently: Take 20 mg by mouth in the morning. (0900))   ondansetron (ZOFRAN) 4 MG tablet Take 4 mg by mouth every 8 (eight) hours as needed for nausea or vomiting.   Ophthalmic Irrigation Solution (EYE [place] OP) Place 1 application into both eyes as needed (eye irritation).   simvastatin (ZOCOR) 20 MG tablet Take 1 tablet (20 mg total) by mouth 3 (three) times a week.   SUMAtriptan (IMITREX) 100 MG tablet May repeat in 2 hours if headache persists or recurs. (Patient taking differently:  Take 100 mg by mouth every 2 (two) hours as needed for migraine (May repeat in 2 hours if headache persists or recurs.).)   tiZANidine (ZANAFLEX) 4 MG tablet Take 1 tablet (4 mg total) by mouth every 8 (eight) hours as needed for muscle spasms. (Patient taking differently: Take 4 mg by mouth in the morning and at bedtime.)   zolpidem (AMBIEN) 10 MG tablet Take 1 tablet (10 mg total) by mouth at bedtime as needed. for sleep   [DISCONTINUED] Alcaftadine (LASTACAFT) 0.25 % SOLN Place 1 drop into both eyes 2 (two) times daily as needed (irritated/dry eyes.).   No facility-administered encounter medications on file as of 03/26/2021.   Past Medical History:  Diagnosis Date   Anxiety    Asthma    Cancer ([place])    left breast cancer   Family history of cancer    Genetic testing 02/01/2018   Multi-Cancer panel (83 genes) @ [facility] - No pathogenic mutations detected   Hemorrhoids    History of radiation therapy 05/27/18- 06/23/18   Left Breast 40.05 Gy in 15 fractions, left breast boost 10 Gy in 5 fractions.    Lipoma of left shoulder    Migraine    Migraine    Osteopenia    Upper GI bleed when she was 70 years old thought to be due to peptic ulcer disease.       Past Surgical History:  Procedure Laterality Date   ABDOMINAL HYSTERECTOMY     APPENDECTOMY     BREAST LUMPECTOMY Left 2018   CESAREAN SECTION     CHOLECYSTECTOMY     COLONOSCOPY WITH PROPOFOL N/A 01/15/2021   Procedure: COLONOSCOPY WITH PROPOFOL;  Surgeon: Harvel Quale, MD;  Location: [facility];  Service: Gastroenterology;  Laterality: N/A;  10:00   ERCP     IR FLUORO GUIDE PORT INSERTION RIGHT  10/05/2017   IR REMOVAL TUN ACCESS W/ PORT W/O FL MOD SED  05/13/2018   IR US GUIDE VASC ACCESS RIGHT  10/05/2017   LIPOMA EXCISION Left 03/16/2018   left shoulder   LIVER SURGERY     MASS EXCISION N/A 03/30/2020   Procedure: EXCISION OF SEBACEOUS CYST ON LEFT NECK, SEBACEOUSE CYST ON BACK AND ATYPICAL MOLE ON RIGHT NECK,  AND LEFT AXILLA SKIN TAG;  Surgeon: Virl Cagey, MD;  Location: [facility];  Service: General;  Laterality: N/A;   PARTIAL MASTECTOMY WITH AXILLARY SENTINEL LYMPH NODE BIOPSY Left 07/31/2017   Procedure: LEFT PARTIAL MASTECTOMY WITH AXILLARY SENTINEL LYMPH NODE BIOPSY;  Surgeon: Virl Cagey, MD;  Location: [facility];  Service: General;  Laterality: Left;   POLYPECTOMY  01/15/2021   Procedure: POLYPECTOMY;  Surgeon: Harvel Quale, MD;  Location: [facility];  Service: Gastroenterology;;   TOTAL KNEE ARTHROPLASTY Left 07/05/2019   Procedure: TOTAL KNEE ARTHROPLASTY;  Surgeon: Renette Butters, MD;  Location: [facility];  Service: Orthopedics;  Laterality: Left;  Allergies  Allergies  Allergen Reactions   Onion Other (See Comments)    REACTION: results in coma state   Penicillins Hives, Shortness Of Breath and Swelling    Throat swelling Has patient had a PCN reaction causing immediate rash, facial/tongue/throat swelling, SOB or lightheadedness with hypotension: Yes Has patient had a PCN reaction causing severe rash involving mucus membranes or skin necrosis: No Has patient had a PCN reaction that required hospitalization: Yes Has patient had a PCN reaction occurring within the last 10 years: No If all of the above answers are "NO", then may proceed with Cephalosporin use.    Sulfa Antibiotics Hives and Shortness Of Breath   Yellow Dye     REACTION: #5 and #9 "will kill me"   Tape Swelling    Pt states she had tape and Tegaderm used on her in [place]. This caused swelling to her face and neck. Wounds and bleeding from area tape/tegaderm was used.    Clindamycin/Lincomycin Hives and Nausea And Vomiting   Latex Itching   Lipitor [Atorvastatin Calcium] Hives   Family history  Father was diagnosed with a brain tumor at age 14 and died at age 7.  2 paternal uncles also died of brain cancer at ages 22 and 48. Mother at age 89 of sudden death. She lost sister at age 67.   She was diagnosed with breast and pancreatic carcinoma.  She has 3 sisters living.  1 sister is diabetic and other sister has chronic GERD and third sister is in good health.  She has a brother age 61 who is alcoholic.  Social history  She is divorced.  She has a daughter age 3 with diabetes and obesity. Patient has never smoked cigarettes or drink alcohol.  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  She is now retired.  She is not able to do much physical activity because of arthritis in her knees.  Objective: Blood pressure (!) 155/92, pulse 74, temperature 98 F (36.7 C), temperature source Oral, height _0  (1.727 m), weight 205 lb 14.4 oz (93.4 kg). Patient is alert and in no acute distress. Conjunctiva is pink. Sclera is nonicteric Oropharyngeal mucosa is normal. No neck masses or thyromegaly noted. Cardiac exam with regular rhythm normal S1 and S2. No murmur or gallop noted. Lungs are clear to auscultation. Abdomen is full.  Bowel sounds are normal.  She has hernia of linea alba.  Palpation reveals soft abdomen with mild periumbilical and epigastric tenderness.  No organomegaly or masses. No LE edema or clubbing noted.  Labs/studies Results:   CBC Latest Ref Rng & Units 11/23/2020 09/10/2020 05/21/2020  WBC 4.0 - 10.5 K/uL 7.1 7.3 7.5  Hemoglobin 12.0 - 15.0 g/dL 13.1 13.6 13.1  Hematocrit 36.0 - 46.0 % 40.1 40.1 39.7  Platelets 150 - 400 K/uL 301 342 327    CMP Latest Ref Rng & Units 11/23/2020 09/10/2020 05/21/2020  Glucose 70 - 99 mg/dL 90 83 83  BUN 8 - 23 mg/dL _1 Creatinine 0.44 - 1.00 mg/dL 0.81 0.73 0.82  Sodium 135 - 145 mmol/L 140 140 139  Potassium 3.5 - 5.1 mmol/L 3.8 4.0 3.7  Chloride 98 - 111 mmol/L 101 102 103  CO2 22 - 32 mmol/L 30 28 32  Calcium 8.9 - 10.3 mg/dL 9.4 9.4 9.8  Total Protein 6.5 - 8.1 g/dL 8.0 7.3 7.7  Total Bilirubin 0.3 - 1.2 mg/dL 0.4  0.4 0.4  Alkaline Phos 38 - 126 U/L 114 - 98   AST 15 - 41 U/L _0 ALT 0 - 44 U/L _1 Hepatic Function Latest Ref Rng & Units 11/23/2020 09/10/2020 05/21/2020  Total Protein 6.5 - 8.1 g/dL 8.0 7.3 7.7  Albumin 3.5 - 5.0 g/dL 4.0 - 3.9  AST 15 - 41 U/L _2 ALT 0 - 44 U/L _3 Alk Phosphatase 38 - 126 U/L 114 - 98  Total Bilirubin 0.3 - 1.2 mg/dL 0.4 0.4 0.4  Bilirubin, Direct 0.0 - 0.2 mg/dL - - -    Abdominal pelvic CT images from 12/21/2020 reviewed. Evidence of prior cholecystectomy.  No abnormality of the liver.  Bile duct not dilated.  No abnormality noted to pancreas spleen adrenals.  Small low-attenuation lesion seen in both kidneys felt to be cysts but too small to characterize. Small sliding hiatal hernia.  No abnormality noted to small large bowel or appendix. Atherosclerotic changes to aorta but no pathologically enlarged lymph nodes. Status post hysterectomy.   Assessment:  #1.  Gastroesophageal reflux disease.  Patient has had symptoms since she was a teenager.  Upper GI tract has never been examined.  Typical symptoms are poorly controlled with antireflux measures and single dose PPI.  It remains to be seen if she has developed peptic ulcer disease leading to worsening GERD symptoms.  She would benefit from changing PPI dose to twice daily.  #2.  Esophageal dysphagia.  She is having dysphagia to both liquids as well as solids.  She may have developed esophageal stricture ring given chronic GERD symptoms.  Esophageal dilation would be undertaken unless there is obvious contraindication.  #3.  Abdominal pain.  She has epigastric pain which radiates inferiorly.  She experienced upper GI bleed when she was 70 years old presumed to be due to peptic ulcer disease.  She is at risk for peptic ulcer disease given that she has been using NSAIDs at least 3 times a week.  She would benefit from diagnostic esophagogastroduodenoscopy. She has hernia of linea alba which has nothing to do with her  symptoms. Similarly incidental finding of colonic lipoma has nothing to do with her symptoms.  #4.  Recent colonoscopy for screening purposes revealed 7 mm tubular adenoma without dysplasia.  He recommended follow-up exam in 7 years which would be in June 2029.   Plan:  Keep ibuprofen dose to minimum and always take with food or snack. Increase omeprazole to 20 mg p.o. twice daily. Hyoscyamine sublingual 1 tablet up to 3 times a day as needed for abdominal pain. Esophagogastroduodenoscopy with esophageal dilation under monitored anesthesia care in near future. Office visit in 3 months.

## 2021-03-26 NOTE — Patient Instructions (Signed)
Keep ibuprofen use to minimum and always take with food or snack. Esophagogastroduodenoscopy with esophageal dilation to be scheduled in near future under monitored anesthesia care.

## 2021-03-26 NOTE — Progress Notes (Signed)
Presenting complaint;  Frequent heartburn despite taking medications, dysphagia and abdominal pain.  History of present illness  Patient is 70-year-old Caucasian female who is here for scheduled visit.  While she has never been seen in the office before she did undergo screening colonoscopy by Dr. Castaneda on 01/15/2021 which revealed midsized lipoma at transverse colon, 7 mm tubular adenoma and proximal transverse colon and diverticula at ascending descending and sigmoid colon. Patient now presents with multiple complaints. She states she has had heartburn since she was a teenager.  She has been on omeprazole for the past few years.  She says it is not working anymore.  Sometimes she has heartburn on drinking few sips of water.  She is having daily heartburn which occurs at different times.  She also has nocturnal regurgitation.  She also experiences regurgitation when she stoops or bends forward or when she is getting up from her recliner.  This is sometimes associated with vomiting.  She has had 2-3 episodes of vomiting in the last 1 month.  No history of hematemesis or melena.  She also complains of soreness in the back of her tongue and throat off and on.  She ran out of omeprazole prescription and experience intractable heartburn.  She did not get much relief with Rolaids and Pepcid OTC.  She also complains of dysphagia with liquids and solids such as spaghetti.  She does not eat much meat.  Her appetite is good.  She has lost 4 pounds in the last few months but she states she has gained several pounds since she had chemo for breast carcinoma.  She received chemo from March 2019 through June 2019. Her third complaint is 1 of abdominal pain which starts in epigastric and radiates along the midline in the hypogastric region.  She describes this pain to be sharp.  She also complains of splashing noise in her abdomen when she eats or drinks.  She has had this pain for months but it has gotten worse  lately.  She did have abdominal pelvic CT about 3 months ago by her oncologist Dr. Feng and no abnormality was noted to account for this pain. She is having as many as 7 stools per day.  Stools are mushy.  She denies rectal bleeding. \She remains with good appetite. She has left knee arthritis and migraine.  She takes ibuprofen 800 mg at least 3 times a week.  She says she has 9-11 episodes of migraine every month.  She is using [employer] tracks.  Current Medications: Outpatient Encounter Medications as of 03/26/2021  Medication Sig   albuterol (PROVENTIL) (2.5 MG/3ML) 0.083% nebulizer solution Take 3 mLs (2.5 mg total) by nebulization every 6 (six) hours as needed for wheezing or shortness of breath.   albuterol (VENTOLIN HFA) 108 (90 Base) MCG/ACT inhaler INHALE 2 PUFFS BY MOUTH EVERY 6 HOURS AS NEEDED FOR WHEEZING FOR SHORTNESS OF BREATH (Patient taking differently: Inhale 2 puffs into the lungs every 6 (six) hours as needed for shortness of breath or wheezing.)   ALPRAZolam (XANAX) 1 MG tablet Take 1 tablet (1 mg total) by mouth 2 (two) times daily as needed. for anxiety   fluticasone (FLOVENT HFA) 110 MCG/ACT inhaler Inhale 2 puffs into the lungs in the morning and at bedtime.   ibuprofen (ADVIL) 800 MG tablet Take 1 tablet (800 mg total) by mouth every 8 (eight) hours as needed.   omeprazole (PRILOSEC) 20 MG capsule TAKE 1 CAPSULE BY MOUTH ONCE DAILY 30 DAYS FOR GASTROPROTECTION   WHILE TAKING NSAIDS (Patient taking differently: Take 20 mg by mouth in the morning. (0900))   ondansetron (ZOFRAN) 4 MG tablet Take 4 mg by mouth every 8 (eight) hours as needed for nausea or vomiting.   Ophthalmic Irrigation Solution (EYE WASH OP) Place 1 application into both eyes as needed (eye irritation).   simvastatin (ZOCOR) 20 MG tablet Take 1 tablet (20 mg total) by mouth 3 (three) times a week.   SUMAtriptan (IMITREX) 100 MG tablet May repeat in 2 hours if headache persists or recurs. (Patient taking differently:  Take 100 mg by mouth every 2 (two) hours as needed for migraine (May repeat in 2 hours if headache persists or recurs.).)   tiZANidine (ZANAFLEX) 4 MG tablet Take 1 tablet (4 mg total) by mouth every 8 (eight) hours as needed for muscle spasms. (Patient taking differently: Take 4 mg by mouth in the morning and at bedtime.)   zolpidem (AMBIEN) 10 MG tablet Take 1 tablet (10 mg total) by mouth at bedtime as needed. for sleep   [DISCONTINUED] Alcaftadine (LASTACAFT) 0.25 % SOLN Place 1 drop into both eyes 2 (two) times daily as needed (irritated/dry eyes.).   No facility-administered encounter medications on file as of 03/26/2021.   Past Medical History:  Diagnosis Date   Anxiety    Asthma    Cancer (HCC)    left breast cancer   Family history of cancer    Genetic testing 02/01/2018   Multi-Cancer panel (83 genes) @ [facility] - No pathogenic mutations detected   Hemorrhoids    History of radiation therapy 05/27/18- 06/23/18   Left Breast 40.05 Gy in 15 fractions, left breast boost 10 Gy in 5 fractions.    Lipoma of left shoulder    Migraine    Migraine    Osteopenia    Upper GI bleed when she was 70 years old thought to be due to peptic ulcer disease.       Past Surgical History:  Procedure Laterality Date   ABDOMINAL HYSTERECTOMY     APPENDECTOMY     BREAST LUMPECTOMY Left 2018   CESAREAN SECTION     CHOLECYSTECTOMY     COLONOSCOPY WITH PROPOFOL N/A 01/15/2021   Procedure: COLONOSCOPY WITH PROPOFOL;  Surgeon: Castaneda Mayorga, Daniel, MD;  Location: [facility];  Service: Gastroenterology;  Laterality: N/A;  10:00   ERCP     IR FLUORO GUIDE PORT INSERTION RIGHT  10/05/2017   IR REMOVAL TUN ACCESS W/ PORT W/O FL MOD SED  05/13/2018   IR US GUIDE VASC ACCESS RIGHT  10/05/2017   LIPOMA EXCISION Left 03/16/2018   left shoulder   LIVER SURGERY     MASS EXCISION N/A 03/30/2020   Procedure: EXCISION OF SEBACEOUS CYST ON LEFT NECK, SEBACEOUSE CYST ON BACK AND ATYPICAL MOLE ON RIGHT NECK,  AND LEFT AXILLA SKIN TAG;  Surgeon: Bridges, Lindsay C, MD;  Location: [facility];  Service: General;  Laterality: N/A;   PARTIAL MASTECTOMY WITH AXILLARY SENTINEL LYMPH NODE BIOPSY Left 07/31/2017   Procedure: LEFT PARTIAL MASTECTOMY WITH AXILLARY SENTINEL LYMPH NODE BIOPSY;  Surgeon: Bridges, Lindsay C, MD;  Location: [facility];  Service: General;  Laterality: Left;   POLYPECTOMY  01/15/2021   Procedure: POLYPECTOMY;  Surgeon: Castaneda Mayorga, Daniel, MD;  Location: [facility];  Service: Gastroenterology;;   TOTAL KNEE ARTHROPLASTY Left 07/05/2019   Procedure: TOTAL KNEE ARTHROPLASTY;  Surgeon: Murphy, Timothy D, MD;  Location: [facility];  Service: Orthopedics;  Laterality: Left;     Allergies  Allergies  Allergen Reactions   Onion Other (See Comments)    REACTION: results in coma state   Penicillins Hives, Shortness Of Breath and Swelling    Throat swelling Has patient had a PCN reaction causing immediate rash, facial/tongue/throat swelling, SOB or lightheadedness with hypotension: Yes Has patient had a PCN reaction causing severe rash involving mucus membranes or skin necrosis: No Has patient had a PCN reaction that required hospitalization: Yes Has patient had a PCN reaction occurring within the last 10 years: No If all of the above answers are "NO", then may proceed with Cephalosporin use.    Sulfa Antibiotics Hives and Shortness Of Breath   Yellow Dye     REACTION: #5 and #9 "will kill me"   Tape Swelling    Pt states she had tape and Tegaderm used on her in [place]. This caused swelling to her face and neck. Wounds and bleeding from area tape/tegaderm was used.    Clindamycin/Lincomycin Hives and Nausea And Vomiting   Latex Itching   Lipitor [Atorvastatin Calcium] Hives   Family history  Father was diagnosed with a brain tumor at age 83 and died at age 84.  2 paternal uncles also died of brain cancer at ages 67 and 69. Mother at age 81 of sudden death. She lost sister at age 56.   She was diagnosed with breast and pancreatic carcinoma.  She has 3 sisters living.  1 sister is diabetic and other sister has chronic GERD and third sister is in good health.  She has a brother age 68 who is alcoholic.  Social history  She is divorced.  She has a daughter age 47 with diabetes and obesity. Patient has never smoked cigarettes or drink alcohol.  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  She is now retired.  She is not able to do much physical activity because of arthritis in her knees.  Objective: Blood pressure (!) 155/92, pulse 74, temperature 98 F (36.7 C), temperature source Oral, height 5' 8" (1.727 m), weight 205 lb 14.4 oz (93.4 kg). Patient is alert and in no acute distress. Conjunctiva is pink. Sclera is nonicteric Oropharyngeal mucosa is normal. No neck masses or thyromegaly noted. Cardiac exam with regular rhythm normal S1 and S2. No murmur or gallop noted. Lungs are clear to auscultation. Abdomen is full.  Bowel sounds are normal.  She has hernia of linea alba.  Palpation reveals soft abdomen with mild periumbilical and epigastric tenderness.  No organomegaly or masses. No LE edema or clubbing noted.  Labs/studies Results:   CBC Latest Ref Rng & Units 11/23/2020 09/10/2020 05/21/2020  WBC 4.0 - 10.5 K/uL 7.1 7.3 7.5  Hemoglobin 12.0 - 15.0 g/dL 13.1 13.6 13.1  Hematocrit 36.0 - 46.0 % 40.1 40.1 39.7  Platelets 150 - 400 K/uL 301 342 327    CMP Latest Ref Rng & Units 11/23/2020 09/10/2020 05/21/2020  Glucose 70 - 99 mg/dL 90 83 83  BUN 8 - 23 mg/dL 10 12 14  Creatinine 0.44 - 1.00 mg/dL 0.81 0.73 0.82  Sodium 135 - 145 mmol/L 140 140 139  Potassium 3.5 - 5.1 mmol/L 3.8 4.0 3.7  Chloride 98 - 111 mmol/L 101 102 103  CO2 22 - 32 mmol/L 30 28 32  Calcium 8.9 - 10.3 mg/dL 9.4 9.4 9.8  Total Protein 6.5 - 8.1 g/dL 8.0 7.3 7.7  Total Bilirubin 0.3 - 1.2 mg/dL 0.4   0.4 0.4  Alkaline Phos 38 - 126 U/L 114 - 98   AST 15 - 41 U/L 20 21 18  ALT 0 - 44 U/L 15 18 17    Hepatic Function Latest Ref Rng & Units 11/23/2020 09/10/2020 05/21/2020  Total Protein 6.5 - 8.1 g/dL 8.0 7.3 7.7  Albumin 3.5 - 5.0 g/dL 4.0 - 3.9  AST 15 - 41 U/L 20 21 18  ALT 0 - 44 U/L 15 18 17  Alk Phosphatase 38 - 126 U/L 114 - 98  Total Bilirubin 0.3 - 1.2 mg/dL 0.4 0.4 0.4  Bilirubin, Direct 0.0 - 0.2 mg/dL - - -    Abdominal pelvic CT images from 12/21/2020 reviewed. Evidence of prior cholecystectomy.  No abnormality of the liver.  Bile duct not dilated.  No abnormality noted to pancreas spleen adrenals.  Small low-attenuation lesion seen in both kidneys felt to be cysts but too small to characterize. Small sliding hiatal hernia.  No abnormality noted to small large bowel or appendix. Atherosclerotic changes to aorta but no pathologically enlarged lymph nodes. Status post hysterectomy.   Assessment:  #1.  Gastroesophageal reflux disease.  Patient has had symptoms since she was a teenager.  Upper GI tract has never been examined.  Typical symptoms are poorly controlled with antireflux measures and single dose PPI.  It remains to be seen if she has developed peptic ulcer disease leading to worsening GERD symptoms.  She would benefit from changing PPI dose to twice daily.  #2.  Esophageal dysphagia.  She is having dysphagia to both liquids as well as solids.  She may have developed esophageal stricture ring given chronic GERD symptoms.  Esophageal dilation would be undertaken unless there is obvious contraindication.  #3.  Abdominal pain.  She has epigastric pain which radiates inferiorly.  She experienced upper GI bleed when she was 70 years old presumed to be due to peptic ulcer disease.  She is at risk for peptic ulcer disease given that she has been using NSAIDs at least 3 times a week.  She would benefit from diagnostic esophagogastroduodenoscopy. She has hernia of linea alba which has nothing to do with her  symptoms. Similarly incidental finding of colonic lipoma has nothing to do with her symptoms.  #4.  Recent colonoscopy for screening purposes revealed 7 mm tubular adenoma without dysplasia.  He recommended follow-up exam in 7 years which would be in June 2029.   Plan:  Keep ibuprofen dose to minimum and always take with food or snack. Increase omeprazole to 20 mg p.o. twice daily. Hyoscyamine sublingual 1 tablet up to 3 times a day as needed for abdominal pain. Esophagogastroduodenoscopy with esophageal dilation under monitored anesthesia care in near future. Office visit in 3 months.      

## 2021-04-01 NOTE — Patient Instructions (Signed)
Mary Wise  04/01/2021     '@PREFPERIOPPHARMACY'$ @   Your procedure is scheduled on  04/10/2021.   Report to [name] at  [place].   Call this number if you have problems the morning of surgery:  [phone]   Remember:  Follow the diet instructions given to you by the office.    Use your nebulizer and your inhalers before you come and bring your rescue inhaler with you.     Take these medicines the morning of surgery with A SIP OF WATER        Xanax (if needed), prilosec, zofran (if needed),imitrex (if needed), zanaflex (if needed).     Do not wear jewelry, make-up or nail polish.  Do not wear lotions, powders, or perfumes, or deodorant.  Do not shave 48 hours prior to surgery.  Men may shave face and neck.  Do not bring valuables to the hospital.  [facility] is not responsible for any belongings or valuables.  Contacts, dentures or bridgework may not be worn into surgery.  Leave your suitcase in the car.  After surgery it may be brought to your room.  For patients admitted to the hospital, discharge time will be determined by your treatment team.  Patients discharged the day of surgery will not be allowed to drive home and must have someone with them for 24 hours.    Special instructions:   DO NOT smoke tobacco or vape for 24 hours before your procedure.  Please read over the following fact sheets that you were given. Anesthesia Post-op Instructions and Care and Recovery After Surgery      Upper Endoscopy, Adult, Care After This sheet gives you information about how to care for yourself after your procedure. Your health care provider may also give you more specific instructions. If you have problems or questions, contact your health careprovider. What can I expect after the procedure? After the procedure, it is common to have: A sore throat. Mild stomach pain or discomfort. Bloating. Nausea. Follow these instructions at home:  Follow  instructions from your health care provider about what to eat or drink after your procedure. Return to your normal activities as told by your health care provider. Ask your health care provider what activities are safe for you. Take over-the-counter and prescription medicines only as told by your health care provider. If you were given a sedative during the procedure, it can affect you for several hours. Do not drive or operate machinery until your health care provider says that it is safe. Keep all follow-up visits as told by your health care provider. This is important. Contact a health care provider if you have: A sore throat that lasts longer than one day. Trouble swallowing. Get help right away if: You vomit blood or your vomit looks like coffee grounds. You have: A fever. Bloody, black, or tarry stools. A severe sore throat or you cannot swallow. Difficulty breathing. Severe pain in your chest or abdomen. Summary After the procedure, it is common to have a sore throat, mild stomach discomfort, bloating, and nausea. If you were given a sedative during the procedure, it can affect you for several hours. Do not drive or operate machinery until your health care provider says that it is safe. Follow instructions from your health care provider about what to eat or drink after your procedure. Return to your normal activities as told by your health care provider. This information  is not intended to replace advice given to you by your health care provider. Make sure you discuss any questions you have with your healthcare provider. Document Revised: 07/19/2019 Document Reviewed: 12/21/2017 Elsevier Patient Education  2022 [place]. https://www.asge.org/home/for-patients/patient-information/understanding-eso-dilation-updated">  Esophageal Dilatation Esophageal dilatation, also called esophageal dilation, is a procedure to widen or open a blocked or narrowed part of the esophagus. The  esophagus is the part of the body that moves food and liquid from the mouth to the stomach. You may need this procedure if: You have a buildup of scar tissue in your esophagus that makes it difficult, painful, or impossible to swallow. This can be caused by gastroesophageal reflux disease (GERD). You have cancer of the esophagus. There is a problem with how food moves through your esophagus. In some cases, you may need this procedure repeated at a later time to dilatethe esophagus gradually. Tell a health care provider about: Any allergies you have. All medicines you are taking, including vitamins, herbs, eye drops, creams, and over-the-counter medicines. Any problems you or family members have had with anesthetic medicines. Any blood disorders you have. Any surgeries you have had. Any medical conditions you have. Any antibiotic medicines you are required to take before dental procedures. Whether you are pregnant or may be pregnant. What are the risks? Generally, this is a safe procedure. However, problems may occur, including: Bleeding due to a tear in the lining of the esophagus. A hole, or perforation, in the esophagus. What happens before the procedure? Ask your health care provider about: Changing or stopping your regular medicines. This is especially important if you are taking diabetes medicines or blood thinners. Taking medicines such as aspirin and ibuprofen. These medicines can thin your blood. Do not take these medicines unless your health care provider tells you to take them. Taking over-the-counter medicines, vitamins, herbs, and supplements. Follow instructions from your health care provider about eating or drinking restrictions. Plan to have a responsible adult take you home from the hospital or clinic. Plan to have a responsible adult care for you for the time you are told after you leave the hospital or clinic. This is important. What happens during the procedure? You may  be given a medicine to help you relax (sedative). A numbing medicine may be sprayed into the back of your throat, or you may gargle the medicine. Your health care provider may perform the dilatation using various surgical instruments, such as: Simple dilators. This instrument is carefully placed in the esophagus to stretch it. Guided wire bougies. This involves using an endoscope to insert a wire into the esophagus. A dilator is passed over this wire to enlarge the esophagus. Then the wire is removed. Balloon dilators. An endoscope with a small balloon is inserted into the esophagus. The balloon is inflated to stretch the esophagus and open it up. The procedure may vary among health care providers and hospitals. What can I expect after the procedure? Your blood pressure, heart rate, breathing rate, and blood oxygen level will be monitored until you leave the hospital or clinic. Your throat may feel slightly sore and numb. This will get better over time. You will not be allowed to eat or drink until your throat is no longer numb. When you are able to drink, urinate, and sit on the edge of the bed without nausea or dizziness, you may be able to return home. Follow these instructions at home: Take over-the-counter and prescription medicines only as told by your health care provider.  If you were given a sedative during the procedure, it can affect you for several hours. Do not drive or operate machinery until your health care provider says that it is safe. Plan to have a responsible adult care for you for the time you are told. This is important. Follow instructions from your health care provider about any eating or drinking restrictions. Do not use any products that contain nicotine or tobacco, such as cigarettes, e-cigarettes, and chewing tobacco. If you need help quitting, ask your health care provider. Keep all follow-up visits. This is important. Contact a health care provider if: You have a  fever. You have pain that is not relieved by medicine. Get help right away if: You have chest pain. You have trouble breathing. You have trouble swallowing. You vomit blood. You have black, tarry, or bloody stools. These symptoms may represent a serious problem that is an emergency. Do not wait to see if the symptoms will go away. Get medical help right away. Call your local emergency services (911 in the [place] Do not drive yourself to the hospital. Summary Esophageal dilatation, also called esophageal dilation, is a procedure to widen or open a blocked or narrowed part of the esophagus. Plan to have a responsible adult take you home from the hospital or clinic. For this procedure, a numbing medicine may be sprayed into the back of your throat, or you may gargle the medicine. Do not drive or operate machinery until your health care provider says that it is safe. This information is not intended to replace advice given to you by your health care provider. Make sure you discuss any questions you have with your healthcare provider. Document Revised: 12/07/2019 Document Reviewed: 12/07/2019 Elsevier Patient Education  [place] After This sheet gives you information about how to care for yourself after your procedure. Your health care provider may also give you more specific instructions. If you have problems or questions, contact your health careprovider. What can I expect after the procedure? After the procedure, it is common to have: Tiredness. Forgetfulness about what happened after the procedure. Impaired judgment for important decisions. Nausea or vomiting. Some difficulty with balance. Follow these instructions at home: For the time period you were told by your health care provider:     Rest as needed. Do not participate in activities where you could fall or become injured. Do not drive or use machinery. Do not drink alcohol. Do not  take sleeping pills or medicines that cause drowsiness. Do not make important decisions or sign legal documents. Do not take care of children on your own. Eating and drinking Follow the diet that is recommended by your health care provider. Drink enough fluid to keep your urine pale yellow. If you vomit: Drink water, juice, or soup when you can drink without vomiting. Make sure you have little or no nausea before eating solid foods. General instructions Have a responsible adult stay with you for the time you are told. It is important to have someone help care for you until you are awake and alert. Take over-the-counter and prescription medicines only as told by your health care provider. If you have sleep apnea, surgery and certain medicines can increase your risk for breathing problems. Follow instructions from your health care provider about wearing your sleep device: Anytime you are sleeping, including during daytime naps. While taking prescription pain medicines, sleeping medicines, or medicines that make you drowsy. Avoid smoking. Keep all follow-up visits as  told by your health care provider. This is important. Contact a health care provider if: You keep feeling nauseous or you keep vomiting. You feel light-headed. You are still sleepy or having trouble with balance after 24 hours. You develop a rash. You have a fever. You have redness or swelling around the IV site. Get help right away if: You have trouble breathing. You have new-onset confusion at home. Summary For several hours after your procedure, you may feel tired. You may also be forgetful and have poor judgment. Have a responsible adult stay with you for the time you are told. It is important to have someone help care for you until you are awake and alert. Rest as told. Do not drive or operate machinery. Do not drink alcohol or take sleeping pills. Get help right away if you have trouble breathing, or if you suddenly  become confused. This information is not intended to replace advice given to you by your health care provider. Make sure you discuss any questions you have with your healthcare provider. Document Revised: 04/05/2020 Document Reviewed: 06/23/2019 Elsevier Patient Education  2022 [employer].

## 2021-04-05 ENCOUNTER — Encounter (HOSPITAL_COMMUNITY): Payer: Self-pay

## 2021-04-05 ENCOUNTER — Other Ambulatory Visit: Payer: Self-pay

## 2021-04-05 ENCOUNTER — Encounter (HOSPITAL_COMMUNITY)
Admission: RE | Admit: 2021-04-05 | Discharge: 2021-04-05 | Disposition: A | Discharge status: Home / Self Care | Payer: Medicare Other | Source: Ambulatory Visit | Attending: Internal Medicine | Admitting: Internal Medicine

## 2021-04-05 DIAGNOSIS — R1319 Other dysphagia: Secondary | ICD-10-CM

## 2021-04-05 DIAGNOSIS — R109 Unspecified abdominal pain: Secondary | ICD-10-CM

## 2021-04-05 DIAGNOSIS — K219 Gastro-esophageal reflux disease without esophagitis: Secondary | ICD-10-CM

## 2021-04-05 HISTORY — DX: Gastro-esophageal reflux disease without esophagitis: K21.9

## 2021-04-09 ENCOUNTER — Other Ambulatory Visit (HOSPITAL_COMMUNITY): Payer: Self-pay | Admitting: Family Medicine

## 2021-04-09 DIAGNOSIS — G43909 Migraine, unspecified, not intractable, without status migrainosus: Secondary | ICD-10-CM | POA: Diagnosis not present

## 2021-04-09 DIAGNOSIS — R252 Cramp and spasm: Secondary | ICD-10-CM | POA: Diagnosis not present

## 2021-04-09 DIAGNOSIS — R1310 Dysphagia, unspecified: Secondary | ICD-10-CM | POA: Diagnosis not present

## 2021-04-09 DIAGNOSIS — Z780 Asymptomatic menopausal state: Secondary | ICD-10-CM

## 2021-04-09 DIAGNOSIS — R030 Elevated blood-pressure reading, without diagnosis of hypertension: Secondary | ICD-10-CM | POA: Diagnosis not present

## 2021-04-10 ENCOUNTER — Encounter (HOSPITAL_COMMUNITY): Payer: Self-pay | Admitting: Internal Medicine

## 2021-04-10 ENCOUNTER — Encounter (HOSPITAL_COMMUNITY)
Admission: RE | Disposition: A | Discharge status: Home / Self Care | Payer: Self-pay | Source: Home / Self Care | Attending: Internal Medicine

## 2021-04-10 ENCOUNTER — Other Ambulatory Visit: Payer: Self-pay

## 2021-04-10 ENCOUNTER — Ambulatory Visit (HOSPITAL_COMMUNITY)
Admission: RE | Admit: 2021-04-10 | Discharge: 2021-04-10 | Disposition: A | Discharge status: Home / Self Care | Payer: Medicare Other | Attending: Internal Medicine | Admitting: Internal Medicine

## 2021-04-10 ENCOUNTER — Ambulatory Visit (HOSPITAL_COMMUNITY): Payer: Medicare Other | Admitting: Anesthesiology

## 2021-04-10 DIAGNOSIS — R12 Heartburn: Secondary | ICD-10-CM | POA: Insufficient documentation

## 2021-04-10 DIAGNOSIS — Z923 Personal history of irradiation: Secondary | ICD-10-CM | POA: Insufficient documentation

## 2021-04-10 DIAGNOSIS — Z7951 Long term (current) use of inhaled steroids: Secondary | ICD-10-CM | POA: Diagnosis not present

## 2021-04-10 DIAGNOSIS — Z888 Allergy status to other drugs, medicaments and biological substances status: Secondary | ICD-10-CM | POA: Insufficient documentation

## 2021-04-10 DIAGNOSIS — R1013 Epigastric pain: Secondary | ICD-10-CM | POA: Diagnosis not present

## 2021-04-10 DIAGNOSIS — K222 Esophageal obstruction: Secondary | ICD-10-CM | POA: Insufficient documentation

## 2021-04-10 DIAGNOSIS — Z882 Allergy status to sulfonamides status: Secondary | ICD-10-CM | POA: Insufficient documentation

## 2021-04-10 DIAGNOSIS — R1314 Dysphagia, pharyngoesophageal phase: Secondary | ICD-10-CM | POA: Diagnosis not present

## 2021-04-10 DIAGNOSIS — R1319 Other dysphagia: Secondary | ICD-10-CM

## 2021-04-10 DIAGNOSIS — Z79899 Other long term (current) drug therapy: Secondary | ICD-10-CM | POA: Diagnosis not present

## 2021-04-10 DIAGNOSIS — K449 Diaphragmatic hernia without obstruction or gangrene: Secondary | ICD-10-CM | POA: Diagnosis not present

## 2021-04-10 DIAGNOSIS — K219 Gastro-esophageal reflux disease without esophagitis: Secondary | ICD-10-CM | POA: Diagnosis not present

## 2021-04-10 DIAGNOSIS — R109 Unspecified abdominal pain: Secondary | ICD-10-CM

## 2021-04-10 DIAGNOSIS — Z91040 Latex allergy status: Secondary | ICD-10-CM | POA: Insufficient documentation

## 2021-04-10 DIAGNOSIS — Z880 Allergy status to penicillin: Secondary | ICD-10-CM | POA: Diagnosis not present

## 2021-04-10 DIAGNOSIS — G43909 Migraine, unspecified, not intractable, without status migrainosus: Secondary | ICD-10-CM | POA: Diagnosis not present

## 2021-04-10 DIAGNOSIS — J45909 Unspecified asthma, uncomplicated: Secondary | ICD-10-CM | POA: Diagnosis not present

## 2021-04-10 DIAGNOSIS — Z853 Personal history of malignant neoplasm of breast: Secondary | ICD-10-CM | POA: Diagnosis not present

## 2021-04-10 DIAGNOSIS — M1712 Unilateral primary osteoarthritis, left knee: Secondary | ICD-10-CM | POA: Insufficient documentation

## 2021-04-10 DIAGNOSIS — R1310 Dysphagia, unspecified: Secondary | ICD-10-CM | POA: Diagnosis not present

## 2021-04-10 DIAGNOSIS — Z881 Allergy status to other antibiotic agents status: Secondary | ICD-10-CM | POA: Diagnosis not present

## 2021-04-10 HISTORY — PX: ESOPHAGOGASTRODUODENOSCOPY (EGD) WITH PROPOFOL: SHX5813

## 2021-04-10 HISTORY — PX: ESOPHAGEAL DILATION: SHX303

## 2021-04-10 SURGERY — ESOPHAGOGASTRODUODENOSCOPY (EGD) WITH PROPOFOL
Anesthesia: General

## 2021-04-10 MED ORDER — PROPOFOL 10 MG/ML IV BOLUS
INTRAVENOUS | Status: DC | PRN
  Administered 2021-04-10: 100 mg via INTRAVENOUS
  Administered 2021-04-10: 5 mg via INTRAVENOUS
  Administered 2021-04-10 (×2): 50 mg via INTRAVENOUS

## 2021-04-10 MED ORDER — LACTATED RINGERS IV SOLN: INTRAVENOUS | Status: DC

## 2021-04-10 MED ORDER — MIDAZOLAM HCL 2 MG/2ML IJ SOLN
INTRAMUSCULAR | Status: AC
  Filled 2021-04-10: qty 2

## 2021-04-10 MED ORDER — MIDAZOLAM HCL 2 MG/2ML IJ SOLN
1.0000 mg | INTRAMUSCULAR | Status: DC | PRN
Start: 2021-04-10 — End: 2021-04-10
  Administered 2021-04-10: 1 mg via INTRAVENOUS

## 2021-04-10 MED ORDER — STERILE WATER FOR IRRIGATION IR SOLN
Status: DC | PRN
  Administered 2021-04-10: 100 mL

## 2021-04-10 NOTE — Interval H&P Note (Signed)
Patient reports improvement in control of heartburn since PPI was doubled.  She is using hyoscyamine sublingual for abdominal pain.  It is helping. Examination is unchanged.  She remains with mild periumbilical and hypogastric tenderness without guarding. Patient is agreeable to proceed with esophagogastroduodenoscopy with esophageal dilation.  History and Physical Interval Note:  04/10/2021 10:53 AM  Mary Wise  has presented today for surgery, with the diagnosis of GERD Abdomianal Pain Dysphagia.  The various methods of treatment have been discussed with the patient and family. After consideration of risks, benefits and other options for treatment, the patient has consented to  Procedure(s) with comments: ESOPHAGOGASTRODUODENOSCOPY (EGD) WITH PROPOFOL (N/A) - 10:55 ESOPHAGEAL DILATION (N/A) as a surgical intervention.  The patient's history has been reviewed, patient examined, no change in status, stable for surgery.  I have reviewed the patient's chart and labs.  Questions were answered to the patient's satisfaction.     [employer]

## 2021-04-10 NOTE — Transfer of Care (Signed)
Immediate Anesthesia Transfer of Care Note  Patient: Mary Wise  Procedure(s) Performed: ESOPHAGOGASTRODUODENOSCOPY (EGD) WITH PROPOFOL ESOPHAGEAL DILATION  Patient Location: Short Stay  Anesthesia Type:General  Level of Consciousness: awake  Airway & Oxygen Therapy: Patient Spontanous Breathing  Post-op Assessment: Report given to RN  Post vital signs: Reviewed  Last Vitals:  Vitals Value Taken Time  BP 136/84 04/10/21 1124  Temp 36.5 C 04/10/21 1124  Pulse 86 04/10/21 1124  Resp 17 04/10/21 1124  SpO2 96 % 04/10/21 1124    Last Pain:  Vitals:   04/10/21 1124  TempSrc: Axillary  PainSc: 0-No pain      Patients Stated Pain Goal: 8 ([id number])  Complications: No notable events documented.

## 2021-04-10 NOTE — Op Note (Signed)
[facility] Patient Name: Mary Wise Procedure Date: 04/10/2021 10:17 AM MRN: [id number] Date of Birth: 09-Feb-1951 Attending MD: Hildred Laser , MD CSN: [id number] Age: 70 Admit Type: Outpatient Procedure:                Upper GI endoscopy Indications:              Epigastric abdominal pain, Esophageal dysphagia,                            Follow-up of gastro-esophageal reflux disease Providers:                Hildred Laser, MD, Rosina Lowenstein, RN, Aram Candela Referring MD:             Leona Carry. Edsel Petrin, NP Medicines:                Propofol per Anesthesia Complications:            No immediate complications. Estimated Blood Loss:     Estimated blood loss was minimal. Procedure:                Pre-Anesthesia Assessment:                           - Prior to the procedure, a History and Physical                            was performed, and patient medications and                            allergies were reviewed. The patient's tolerance of                            previous anesthesia was also reviewed. The risks                            and benefits of the procedure and the sedation                            options and risks were discussed with the patient.                            All questions were answered, and informed consent                            was obtained. Prior Anticoagulants: The patient has                            taken no previous anticoagulant or antiplatelet                            agents except for NSAID medication. ASA Grade                            Assessment: III - A patient with severe systemic  disease. After reviewing the risks and benefits,                            the patient was deemed in satisfactory condition to                            undergo the procedure.                           After obtaining informed consent, the endoscope was                            passed under direct vision. Throughout  the                            procedure, the patient's blood pressure, pulse, and                            oxygen saturations were monitored continuously. The                            [id number] scope was introduced through the                            mouth, and advanced to the second part of duodenum.                            The upper GI endoscopy was accomplished without                            difficulty. The patient tolerated the procedure                            well. Scope In: 11:04:54 AM Scope Out: [id number] AM Total Procedure Duration: 0 hours 11 minutes 19 seconds  Findings:      The hypopharynx was normal.      The examined esophagus was normal.      A widely patent Schatzki ring was found at the gastroesophageal       junction. The scope was withdrawn. Dilation was performed with a Maloney       dilator with no resistance at 18 Fr and 56 Fr. The dilation site was       examined following endoscope reinsertion and showed no change and no       bleeding, mucosal tear or perforation. Schatzki's ring was intact and       disrupted with focal biopsy at 3 sites. No tissue saved.      A 3 cm hiatal hernia was present.      The entire examined stomach was normal.      The duodenal bulb and second portion of the duodenum were normal. Impression:               - Normal hypopharynx.                           - Normal esophagus.                           -  Widely patent Schatzki ring. Dilated and                            disrupted(focal biopsy).                           - 3 cm hiatal hernia.                           - Normal stomach.                           - Normal duodenal bulb and second portion of the                            duodenum.                           - No specimens collected. Moderate Sedation:      Per Anesthesia Care Recommendation:           - Patient has a contact number available for                            emergencies. The  signs and symptoms of potential                            delayed complications were discussed with the                            patient. Return to normal activities tomorrow.                            Written discharge instructions were provided to the                            patient.                           - Resume previous diet today.                           - No aspirin, ibuprofen, naproxen, or other                            non-steroidal anti-inflammatory drugs for 1 day.                           - Continue present medications.                           - Telephone GI clinic in 1 week. Procedure Code(s):        --- Professional ---                           [phone], Esophagogastroduodenoscopy, flexible,  transoral; diagnostic, including collection of                            specimen(s) by brushing or washing, when performed                            (separate procedure)                           43450, Dilation of esophagus, by unguided sound or                            bougie, single or multiple passes Diagnosis Code(s):        --- Professional ---                           K22.2, Esophageal obstruction                           K44.9, Diaphragmatic hernia without obstruction or                            gangrene                           R10.13, Epigastric pain                           R13.14, Dysphagia, pharyngoesophageal phase                           K21.9, Gastro-esophageal reflux disease without                            esophagitis CPT copyright 2019 American Medical Association. All rights reserved. The codes documented in this report are preliminary and upon coder review may  be revised to meet current compliance requirements. Hildred Laser, MD Hildred Laser, MD 04/10/2021 11:25:27 AM This report has been signed electronically. Number of Addenda: 0

## 2021-04-10 NOTE — Anesthesia Postprocedure Evaluation (Signed)
Anesthesia Post Note  Patient: GRIZELDA AMPARANO  Procedure(s) Performed: ESOPHAGOGASTRODUODENOSCOPY (EGD) WITH PROPOFOL ESOPHAGEAL DILATION  Patient location during evaluation: Short Stay Anesthesia Type: General Level of consciousness: awake and alert Pain management: pain level controlled Vital Signs Assessment: post-procedure vital signs reviewed and stable Respiratory status: spontaneous breathing Cardiovascular status: blood pressure returned to baseline and stable Postop Assessment: no apparent nausea or vomiting Anesthetic complications: no   No notable events documented.   Last Vitals:  Vitals:   04/10/21 0935 04/10/21 1124  BP: 140/88 136/84  Pulse: 78 86  Resp: 11 17  Temp: 36.7 C 36.5 C  SpO2: 99% 96%    Last Pain:  Vitals:   04/10/21 1124  TempSrc: Axillary  PainSc: 0-No pain                 Laray Corbit

## 2021-04-10 NOTE — Discharge Instructions (Signed)
No aspirin or NSAIDs for 24 hours. Resume usual medications as before. Resume usual diet. No driving for 24 hours. Please call office with progress report in 1 week.

## 2021-04-10 NOTE — Anesthesia Preprocedure Evaluation (Signed)
Anesthesia Evaluation  Patient identified by MRN, date of birth, ID band Patient awake    Reviewed: Allergy & Precautions, H&P , NPO status , Patient's Chart, lab work & pertinent test results, reviewed documented beta blocker date and time   Airway Mallampati: II  TM Distance: >3 FB Neck ROM: full    Dental no notable dental hx.    Pulmonary neg pulmonary ROS, asthma ,    Pulmonary exam normal breath sounds clear to auscultation       Cardiovascular Exercise Tolerance: Good hypertension, negative cardio ROS   Rhythm:regular Rate:Normal     Neuro/Psych  Headaches, Seizures -,  PSYCHIATRIC DISORDERS Anxiety  Neuromuscular disease negative neurological ROS  negative psych ROS   GI/Hepatic negative GI ROS, Neg liver ROS, GERD  Medicated,  Endo/Other  negative endocrine ROS  Renal/GU negative Renal ROS  negative genitourinary   Musculoskeletal   Abdominal   Peds  Hematology negative hematology ROS (+)   Anesthesia Other Findings   Reproductive/Obstetrics negative OB ROS                             Anesthesia Physical Anesthesia Plan  ASA: 3  Anesthesia Plan: General   Post-op Pain Management:    Induction:   PONV Risk Score and Plan: Propofol infusion  Airway Management Planned:   Additional Equipment:   Intra-op Plan:   Post-operative Plan:   Informed Consent: I have reviewed the patients History and Physical, chart, labs and discussed the procedure including the risks, benefits and alternatives for the proposed anesthesia with the patient or authorized representative who has indicated his/her understanding and acceptance.     Dental Advisory Given  Plan Discussed with: CRNA  Anesthesia Plan Comments:         Anesthesia Quick Evaluation

## 2021-04-15 ENCOUNTER — Other Ambulatory Visit: Payer: Self-pay

## 2021-04-15 ENCOUNTER — Ambulatory Visit (HOSPITAL_COMMUNITY)
Admission: RE | Admit: 2021-04-15 | Discharge: 2021-04-15 | Disposition: A | Discharge status: Home / Self Care | Payer: Medicare Other | Source: Ambulatory Visit | Attending: Family Medicine | Admitting: Family Medicine

## 2021-04-15 DIAGNOSIS — Z780 Asymptomatic menopausal state: Secondary | ICD-10-CM | POA: Insufficient documentation

## 2021-04-15 DIAGNOSIS — M85832 Other specified disorders of bone density and structure, left forearm: Secondary | ICD-10-CM | POA: Diagnosis not present

## 2021-04-17 ENCOUNTER — Encounter (HOSPITAL_COMMUNITY): Payer: Self-pay | Admitting: Internal Medicine

## 2021-05-01 ENCOUNTER — Other Ambulatory Visit: Payer: Medicare Other

## 2021-05-01 ENCOUNTER — Ambulatory Visit: Payer: Medicare Other | Admitting: Hematology

## 2021-05-06 ENCOUNTER — Ambulatory Visit (INDEPENDENT_AMBULATORY_CARE_PROVIDER_SITE_OTHER): Payer: Medicare Other | Admitting: Gastroenterology

## 2021-06-01 DIAGNOSIS — Z23 Encounter for immunization: Secondary | ICD-10-CM | POA: Diagnosis not present

## 2021-06-02 DIAGNOSIS — U071 COVID-19: Secondary | ICD-10-CM | POA: Diagnosis not present

## 2021-06-18 ENCOUNTER — Other Ambulatory Visit (HOSPITAL_COMMUNITY): Payer: Self-pay | Admitting: Family Medicine

## 2021-06-18 DIAGNOSIS — M25532 Pain in left wrist: Secondary | ICD-10-CM | POA: Diagnosis not present

## 2021-06-18 DIAGNOSIS — G4700 Insomnia, unspecified: Secondary | ICD-10-CM | POA: Diagnosis not present

## 2021-06-18 DIAGNOSIS — R52 Pain, unspecified: Secondary | ICD-10-CM

## 2021-06-18 DIAGNOSIS — M8580 Other specified disorders of bone density and structure, unspecified site: Secondary | ICD-10-CM | POA: Diagnosis not present

## 2021-06-18 DIAGNOSIS — Z853 Personal history of malignant neoplasm of breast: Secondary | ICD-10-CM | POA: Diagnosis not present

## 2021-07-11 ENCOUNTER — Ambulatory Visit (INDEPENDENT_AMBULATORY_CARE_PROVIDER_SITE_OTHER): Payer: Medicare Other | Admitting: Gastroenterology

## 2021-07-19 ENCOUNTER — Other Ambulatory Visit (INDEPENDENT_AMBULATORY_CARE_PROVIDER_SITE_OTHER): Payer: Self-pay | Admitting: *Deleted

## 2021-07-19 ENCOUNTER — Telehealth (INDEPENDENT_AMBULATORY_CARE_PROVIDER_SITE_OTHER): Payer: Self-pay | Admitting: *Deleted

## 2021-07-19 NOTE — Telephone Encounter (Signed)
Received fax for request for refill on omeprazole 20mg  one bid. #60 Pt last seen 03/26/21. I went to send in rx and it came up a warning that pt is allergic to yellow dye. Comment on warning was #5 and #9 "will kill me"  [facility].  Left message to return call with pt to discuss.

## 2021-07-22 ENCOUNTER — Other Ambulatory Visit (INDEPENDENT_AMBULATORY_CARE_PROVIDER_SITE_OTHER): Payer: Self-pay | Admitting: Gastroenterology

## 2021-07-22 ENCOUNTER — Other Ambulatory Visit (INDEPENDENT_AMBULATORY_CARE_PROVIDER_SITE_OTHER): Payer: Self-pay | Admitting: *Deleted

## 2021-07-22 MED ORDER — OMEPRAZOLE 20 MG PO CPDR: 20.0000 mg | DELAYED_RELEASE_CAPSULE | Freq: Two times a day (BID) | ORAL | 5 refills | Status: DC

## 2021-07-22 MED ORDER — OMEPRAZOLE 20 MG PO CPDR: 20.0000 mg | DELAYED_RELEASE_CAPSULE | Freq: Two times a day (BID) | ORAL | 0 refills | Status: DC

## 2021-07-22 NOTE — Telephone Encounter (Signed)
Pt called back and I told her about the warning to the yellow dye #5 and #9 and that we had in her chart she said "will kill me" she states she has been taking omeprazole with no problems and would like refill sent in.

## 2021-08-21 ENCOUNTER — Other Ambulatory Visit (HOSPITAL_COMMUNITY): Payer: Self-pay | Admitting: Family Medicine

## 2021-08-21 ENCOUNTER — Other Ambulatory Visit: Payer: Self-pay | Admitting: Family Medicine

## 2021-08-21 DIAGNOSIS — R1031 Right lower quadrant pain: Secondary | ICD-10-CM

## 2021-08-21 DIAGNOSIS — Z853 Personal history of malignant neoplasm of breast: Secondary | ICD-10-CM

## 2021-09-16 ENCOUNTER — Ambulatory Visit (HOSPITAL_COMMUNITY)
Admission: RE | Admit: 2021-09-16 | Discharge: 2021-09-16 | Disposition: A | Discharge status: Home / Self Care | Payer: Medicare Other | Source: Ambulatory Visit | Attending: Family Medicine | Admitting: Family Medicine

## 2021-09-16 ENCOUNTER — Other Ambulatory Visit: Payer: Self-pay

## 2021-09-16 DIAGNOSIS — R1031 Right lower quadrant pain: Secondary | ICD-10-CM | POA: Insufficient documentation

## 2021-09-16 LAB — POCT I-STAT CREATININE: Creatinine, Ser: 0.9 mg/dL (ref 0.44–1.00)

## 2021-09-16 MED ORDER — IOHEXOL 300 MG/ML  SOLN
100.0000 mL | Freq: Once | INTRAMUSCULAR | Status: AC | PRN
  Administered 2021-09-16: 100 mL via INTRAVENOUS

## 2021-11-20 ENCOUNTER — Other Ambulatory Visit: Payer: Self-pay | Admitting: Hematology

## 2021-11-20 DIAGNOSIS — Z1231 Encounter for screening mammogram for malignant neoplasm of breast: Secondary | ICD-10-CM

## 2021-11-20 DIAGNOSIS — N644 Mastodynia: Secondary | ICD-10-CM

## 2021-12-11 LAB — LAB REPORT - SCANNED: EGFR: 78

## 2021-12-20 ENCOUNTER — Ambulatory Visit: Payer: Medicare Other

## 2021-12-20 ENCOUNTER — Ambulatory Visit
Admission: RE | Admit: 2021-12-20 | Discharge: 2021-12-20 | Disposition: A | Discharge status: Home / Self Care | Payer: Medicare Other | Source: Ambulatory Visit | Attending: Hematology | Admitting: Hematology

## 2021-12-20 DIAGNOSIS — N644 Mastodynia: Secondary | ICD-10-CM

## 2021-12-26 ENCOUNTER — Telehealth: Payer: Self-pay | Admitting: Hematology

## 2021-12-26 NOTE — Telephone Encounter (Signed)
.  Called patient to schedule appointment per 5/22 inbasket, patient is aware of date and time.

## 2022-01-03 ENCOUNTER — Encounter (HOSPITAL_COMMUNITY): Payer: Self-pay | Admitting: *Deleted

## 2022-01-03 ENCOUNTER — Other Ambulatory Visit: Payer: Self-pay

## 2022-01-03 ENCOUNTER — Emergency Department (HOSPITAL_COMMUNITY): Payer: Medicare Other

## 2022-01-03 ENCOUNTER — Emergency Department (HOSPITAL_COMMUNITY)
Admission: EM | Admit: 2022-01-03 | Discharge: 2022-01-03 | Disposition: A | Discharge status: Home / Self Care | Payer: Medicare Other | Attending: Emergency Medicine | Admitting: Emergency Medicine

## 2022-01-03 DIAGNOSIS — Z20822 Contact with and (suspected) exposure to covid-19: Secondary | ICD-10-CM | POA: Insufficient documentation

## 2022-01-03 DIAGNOSIS — J181 Lobar pneumonia, unspecified organism: Secondary | ICD-10-CM | POA: Diagnosis not present

## 2022-01-03 DIAGNOSIS — Z91040 Latex allergy status: Secondary | ICD-10-CM | POA: Insufficient documentation

## 2022-01-03 DIAGNOSIS — E876 Hypokalemia: Secondary | ICD-10-CM | POA: Insufficient documentation

## 2022-01-03 DIAGNOSIS — Z7951 Long term (current) use of inhaled steroids: Secondary | ICD-10-CM | POA: Diagnosis not present

## 2022-01-03 DIAGNOSIS — Z853 Personal history of malignant neoplasm of breast: Secondary | ICD-10-CM | POA: Insufficient documentation

## 2022-01-03 DIAGNOSIS — J45909 Unspecified asthma, uncomplicated: Secondary | ICD-10-CM | POA: Diagnosis not present

## 2022-01-03 DIAGNOSIS — R059 Cough, unspecified: Secondary | ICD-10-CM | POA: Diagnosis present

## 2022-01-03 DIAGNOSIS — J189 Pneumonia, unspecified organism: Secondary | ICD-10-CM

## 2022-01-03 LAB — BASIC METABOLIC PANEL
Anion gap: 10 (ref 5–15)
BUN: 10 mg/dL (ref 8–23)
CO2: 26 mmol/L (ref 22–32)
Calcium: 8.9 mg/dL (ref 8.9–10.3)
Chloride: 103 mmol/L (ref 98–111)
Creatinine, Ser: 0.79 mg/dL (ref 0.44–1.00)
GFR, Estimated: >60 mL/min (ref >60)
Glucose, Bld: 97 mg/dL (ref 70–99)
Potassium: 2.9 mmol/L — ABNORMAL LOW (ref 3.5–5.1)
Sodium: 139 mmol/L (ref 135–145)

## 2022-01-03 LAB — CBC WITH DIFFERENTIAL/PLATELET
Abs Immature Granulocytes: 0.01 10*3/uL (ref 0.00–0.07)
Basophils Absolute: 0.1 10*3/uL (ref 0.0–0.1)
Basophils Relative: 1 %
Eosinophils Absolute: 0.2 10*3/uL (ref 0.0–0.5)
Eosinophils Relative: 2 %
HCT: 41.6 % (ref 36.0–46.0)
Hemoglobin: 13.7 g/dL (ref 12.0–15.0)
Immature Granulocytes: 0 %
Lymphocytes Relative: 17 %
Lymphs Abs: 1.4 10*3/uL (ref 0.7–4.0)
MCH: 28.9 pg (ref 26.0–34.0)
MCHC: 32.9 g/dL (ref 30.0–36.0)
MCV: 87.8 fL (ref 80.0–100.0)
Monocytes Absolute: 0.5 10*3/uL (ref 0.1–1.0)
Monocytes Relative: 6 %
Neutro Abs: 5.9 10*3/uL (ref 1.7–7.7)
Neutrophils Relative %: 74 %
Platelets: 270 10*3/uL (ref 150–400)
RBC: 4.74 MIL/uL (ref 3.87–5.11)
RDW: 13.2 % (ref 11.5–15.5)
WBC: 8 10*3/uL (ref 4.0–10.5)
nRBC: 0 % (ref 0.0–0.2)

## 2022-01-03 LAB — RESP PANEL BY RT-PCR (FLU A&B, COVID) ARPGX2
Influenza A by PCR: NEGATIVE
Influenza B by PCR: NEGATIVE
SARS Coronavirus 2 by RT PCR: NEGATIVE

## 2022-01-03 LAB — MAGNESIUM: Magnesium: 1.8 mg/dL (ref 1.7–2.4)

## 2022-01-03 MED ORDER — POTASSIUM CHLORIDE ER 10 MEQ PO TBCR: 10.0000 meq | EXTENDED_RELEASE_TABLET | Freq: Every day | ORAL | 0 refills | Status: DC

## 2022-01-03 MED ORDER — DOXYCYCLINE HYCLATE 100 MG PO CAPS: 100.0000 mg | ORAL_CAPSULE | Freq: Two times a day (BID) | ORAL | 0 refills | Status: AC

## 2022-01-03 MED ORDER — POTASSIUM CHLORIDE CRYS ER 20 MEQ PO TBCR
40.0000 meq | EXTENDED_RELEASE_TABLET | Freq: Once | ORAL | Status: AC
  Administered 2022-01-03: 40 meq via ORAL
  Filled 2022-01-03: qty 2

## 2022-01-03 NOTE — Discharge Instructions (Signed)
Pneumonia-started on antibiotics please take as prescribed.  Low potassium-sodium potassium pills please take prescribed please follow-up with your primary care doctor 5 days time for reassess of your potassium as well as your pneumonia.  Come back to the emergency department if you develop chest pain, shortness of breath, severe abdominal pain, uncontrolled nausea, vomiting, diarrhea.

## 2022-01-03 NOTE — ED Provider Notes (Signed)
[facility] EMERGENCY DEPARTMENT Provider Note   CSN: [id number] Arrival date & time: 01/03/22  1414     History  Chief Complaint  Patient presents with   Cough    Mary Wise is a 71 y.o. female.  HPI  Patient with medical history including GERD, seizures, asthma, partial mastectomy due to breast cancer, presents to the emerged part with complaints of URI-like symptoms.  Patient dates symptoms started 5 days ago, she endorses productive cough, nasal congestion, sore throat, sinus pressure, decrease in appetite.  She denies any shortness of breath, chest pain, stomach pains, nausea vomiting diarrhea.  She denies recent sick contacts, she is not immunocompromise, up-to-date on COVID and influenza vaccine.  She has really complaints at this time.  Home Medications Prior to Admission medications   Medication Sig Start Date End Date Taking? Authorizing Provider  doxycycline (VIBRAMYCIN) 100 MG capsule Take 1 capsule (100 mg total) by mouth 2 (two) times daily for 7 days. 01/03/22 01/10/22 Yes Marcello Fennel, PA-C  potassium chloride (KLOR-CON) 10 MEQ tablet Take 1 tablet (10 mEq total) by mouth daily for 4 days. 01/03/22 01/07/22 Yes Marcello Fennel, PA-C  albuterol (PROVENTIL) (2.5 MG/3ML) 0.083% nebulizer solution Take 3 mLs (2.5 mg total) by nebulization every 6 (six) hours as needed for wheezing or shortness of breath. 05/29/20   [place], Modena Nunnery, MD  albuterol (VENTOLIN HFA) 108 (90 Base) MCG/ACT inhaler INHALE 2 PUFFS BY MOUTH EVERY 6 HOURS AS NEEDED FOR WHEEZING FOR SHORTNESS OF BREATH Patient taking differently: Inhale 2 puffs into the lungs every 6 (six) hours as needed for shortness of breath or wheezing. 12/17/20   Eulogio Bear, NP  ALPRAZolam Duanne Moron) 1 MG tablet Take 1 tablet (1 mg total) by mouth 2 (two) times daily as needed. for anxiety Patient taking differently: Take 1 mg by mouth 2 (two) times daily. 02/09/21   Eulogio Bear, NP  ezetimibe (ZETIA) 10 MG  tablet Take 10 mg by mouth daily. 02/06/21   [provider]  fluticasone (FLOVENT HFA) 110 MCG/ACT inhaler Inhale 2 puffs into the lungs in the morning and at bedtime. Patient taking differently: Inhale 2 puffs into the lungs in the morning. 10/03/20   [place], Modena Nunnery, MD  hyoscyamine (LEVSIN SL) 0.125 MG SL tablet Place 1 tablet (0.125 mg total) under the tongue 3 (three) times daily as needed. 03/26/21   Rogene Houston, MD  ibuprofen (ADVIL) 800 MG tablet Take 1 tablet (800 mg total) by mouth every 8 (eight) hours as needed. Patient taking differently: Take 400-800 mg by mouth every 8 (eight) hours as needed for moderate pain. 05/29/20   [place], Modena Nunnery, MD  lidocaine (LIDODERM) 5 % Place 0.5-1 patches onto the skin daily as needed (pain). Remove & Discard patch within 12 hours or as directed by MD    [provider]  omeprazole (PRILOSEC) 20 MG capsule Take 1 capsule (20 mg total) by mouth 2 (two) times daily before a meal. 07/22/21   Carlan, Chelsea L, NP  ondansetron (ZOFRAN) 4 MG tablet Take 4 mg by mouth every 8 (eight) hours as needed for nausea or vomiting.    [provider]  Ophthalmic Irrigation Solution (EYE [place] OP) Place 1 application into both eyes 3 (three) times daily as needed (eye irritation).    [provider]  simvastatin (ZOCOR) 20 MG tablet Take 1 tablet (20 mg total) by mouth 3 (three) times a week. 01/25/21   Noemi Chapel  A, NP  SUMAtriptan (IMITREX) 100 MG tablet May repeat in 2 hours if headache persists or recurs. Patient taking differently: Take 100 mg by mouth every 2 (two) hours as needed for migraine (May repeat in 2 hours if headache persists or recurs.). 12/17/20   Eulogio Bear, NP  tiZANidine (ZANAFLEX) 4 MG tablet Take 1 tablet (4 mg total) by mouth every 8 (eight) hours as needed for muscle spasms. 01/09/21   Eulogio Bear, NP  zolpidem (AMBIEN) 10 MG tablet Take 1 tablet (10 mg total) by mouth at bedtime as  needed. for sleep 02/09/21   Eulogio Bear, NP      Allergies    Onion, Penicillins, Sulfa antibiotics, Yellow dye, Tape, Clindamycin/lincomycin, Latex, and Lipitor [atorvastatin calcium]    Review of Systems   Review of Systems  Constitutional:  Positive for appetite change, chills and fever.  HENT:  Positive for congestion and sore throat. Negative for trouble swallowing.   Respiratory:  Positive for cough. Negative for shortness of breath.   Cardiovascular:  Negative for chest pain.  Gastrointestinal:  Negative for abdominal pain.  Neurological:  Negative for headaches.   Physical Exam Updated Vital Signs BP (!) 151/91 (BP Location: Right Arm)   Pulse (!) 104   Temp 98.5 F (36.9 C) (Oral)   Resp 20   SpO2 95%  Physical Exam Vitals and nursing note reviewed.  Constitutional:      General: She is not in acute distress.    Appearance: She is not ill-appearing.  HENT:     Head: Normocephalic and atraumatic.     Right Ear: Tympanic membrane, ear canal and external ear normal.     Left Ear: Tympanic membrane, ear canal and external ear normal.     Nose: No congestion.     Mouth/Throat:     Mouth: Mucous membranes are moist.     Pharynx: Oropharynx is clear. No oropharyngeal exudate or posterior oropharyngeal erythema.     Comments: No trismus no torticollis, tonsils were equal symmetrical, no erythema or exudates present, no submandibular swelling. Eyes:     Conjunctiva/sclera: Conjunctivae normal.  Cardiovascular:     Rate and Rhythm: Normal rate and regular rhythm.     Pulses: Normal pulses.     Heart sounds: No murmur heard.   No friction rub. No gallop.  Pulmonary:     Effort: No respiratory distress.     Breath sounds: No wheezing, rhonchi or rales.  Abdominal:     Palpations: Abdomen is soft.     Tenderness: There is no abdominal tenderness. There is no right CVA tenderness or left CVA tenderness.  Musculoskeletal:     Right lower leg: No edema.     Left  lower leg: No edema.  Skin:    General: Skin is warm and dry.  Neurological:     Mental Status: She is alert.  Psychiatric:        Mood and Affect: Mood normal.    ED Results / Procedures / Treatments   Labs (all labs ordered are listed, but only abnormal results are displayed) Labs Reviewed  BASIC METABOLIC PANEL - Abnormal; Notable for the following components:      Result Value   Potassium 2.9 (*)    All other components within normal limits  RESP PANEL BY RT-PCR (FLU A&B, COVID) ARPGX2  CBC WITH DIFFERENTIAL/PLATELET  MAGNESIUM    EKG None  Radiology DG Chest Portable 1 View  Result Date: 01/03/2022 CLINICAL  DATA:  Productive cough with green sputum, symptoms for 5 days, history breast cancer, asthma EXAM: PORTABLE CHEST 1 VIEW COMPARISON:  Portable exam 1722 hours compared to 07/06/2019 FINDINGS: Normal heart size, mediastinal contours, and pulmonary vascularity. Minimal RIGHT basilar atelectasis. Atelectasis versus consolidation at LEFT lung base. Remaining lungs clear. No pleural effusion or pneumothorax. Bones demineralized. IMPRESSION: Atelectasis versus consolidation at LEFT lung base. Electronically Signed   By: Lavonia Dana M.D.   On: 01/03/2022 17:36    Procedures Procedures    Medications Ordered in ED Medications  potassium chloride SA (KLOR-CON M) CR tablet 40 mEq (40 mEq Oral Given 01/03/22 1841)    ED Course/ Medical Decision Making/ A&P                           Medical Decision Making Amount and/or Complexity of Data Reviewed Labs: ordered. Radiology: ordered.  Risk Prescription drug management.   This patient presents to the ED for concern of URI, this involves an extensive number of treatment options, and is a complaint that carries with it a high risk of complications and morbidity.  The differential diagnosis includes pneumonia, PE, sepsis    Additional history obtained:  Additional history obtained from N/A External records from outside  source obtained and reviewed including previous oncology notes   Co morbidities that complicate the patient evaluation  Cancer  Social Determinants of Health:  Geriatric    Lab Tests:  I Ordered, and personally interpreted labs.  The pertinent results include: CBC unremarkable, BMP shows potassium 2.9, magnesium 2.1   Imaging Studies ordered:  I ordered imaging studies including chest x-ray I independently visualized and interpreted imaging which showed consolidation in the left lower lobe I agree with the radiologist interpretation   Cardiac Monitoring:  The patient was maintained on a cardiac monitor.  I personally viewed and interpreted the cardiac monitored which showed an underlying rhythm of: N/A   Medicines ordered and prescription drug management:  I ordered medication including potassium I have reviewed the patients home medicines and have made adjustments as needed  Critical Interventions:  N/A   Reevaluation:  Presents with URI-like symptoms, will obtain basic lab work-up, chest x-ray and reassess  Potassium is notably low, will provide with supplemental potassium, check magnesium.  Updated patient on lab or imaging, she has no complaints, she is agreeable to discharge at this time.  Consultations Obtained:  N/A    Considered:  Admission for pneumonia-deferred as patient has no new oxygen requirements, she is afebrile no leukocytosis, patient failed outpatient therapy.  Patient has a curb score of 1.    Rule out Low suspicion for systemic infection as patient is nontoxic-appearing, vital signs reassuring, no obvious source infection noted on exam.  I have low suspicion for PE nontachypneic nonhypoxic, presentation atypical etiology more consistent with likely pneumonia seen on chest x-ray. low suspicion for strep throat as oropharynx was visualized, no erythema or exudates noted.  Low suspicion patient would need  hospitalized due to viral  infection or Covid as vital signs reassuring, patient is not in respiratory distress.      Dispostion and problem list  After consideration of the diagnostic results and the patients response to treatment, I feel that the patent would benefit from discharge.  CAP-we will start her on Z-Pak, follow-up PCP as needed strict return precautions. Hypokalemia-we will provide patient with 40s with potassium, follow-up with PCP for reevaluation.  Patient is on albuterol  likely the cause.            Final Clinical Impression(s) / ED Diagnoses Final diagnoses:  Community acquired pneumonia of left lower lobe of lung    Rx / DC Orders ED Discharge Orders          Ordered    doxycycline (VIBRAMYCIN) 100 MG capsule  2 times daily        01/03/22 1911    potassium chloride (KLOR-CON) 10 MEQ tablet  Daily        01/03/22 1912              Aron Baba 01/03/22 1914    Davonna Belling, MD 01/04/22 [phone]

## 2022-01-03 NOTE — ED Triage Notes (Signed)
Pt in c/o productive cough with green sputum, pt reports symptom x 5 days ago, denies fever, chills, reports x 2 neg Covid tests, pt c/o sore throat, pt c/o sinus tenderness

## 2022-01-28 ENCOUNTER — Other Ambulatory Visit: Payer: Self-pay

## 2022-01-28 DIAGNOSIS — C50412 Malignant neoplasm of upper-outer quadrant of left female breast: Secondary | ICD-10-CM

## 2022-01-29 ENCOUNTER — Other Ambulatory Visit: Payer: Self-pay

## 2022-01-29 ENCOUNTER — Encounter: Payer: Self-pay | Admitting: Hematology

## 2022-01-29 ENCOUNTER — Inpatient Hospital Stay: Payer: Medicare Other | Attending: Hematology

## 2022-01-29 ENCOUNTER — Inpatient Hospital Stay (HOSPITAL_BASED_OUTPATIENT_CLINIC_OR_DEPARTMENT_OTHER): Payer: Medicare Other | Admitting: Hematology

## 2022-01-29 VITALS — BP 168/91 | HR 73 | Temp 98.4°F | Resp 18 | Ht 68.0 in | Wt 202.4 lb

## 2022-01-29 DIAGNOSIS — Z79899 Other long term (current) drug therapy: Secondary | ICD-10-CM | POA: Insufficient documentation

## 2022-01-29 DIAGNOSIS — M8580 Other specified disorders of bone density and structure, unspecified site: Secondary | ICD-10-CM | POA: Insufficient documentation

## 2022-01-29 DIAGNOSIS — R519 Headache, unspecified: Secondary | ICD-10-CM | POA: Diagnosis not present

## 2022-01-29 DIAGNOSIS — Z171 Estrogen receptor negative status [ER-]: Secondary | ICD-10-CM | POA: Diagnosis not present

## 2022-01-29 DIAGNOSIS — R479 Unspecified speech disturbances: Secondary | ICD-10-CM | POA: Diagnosis not present

## 2022-01-29 DIAGNOSIS — C50412 Malignant neoplasm of upper-outer quadrant of left female breast: Secondary | ICD-10-CM

## 2022-01-29 DIAGNOSIS — F419 Anxiety disorder, unspecified: Secondary | ICD-10-CM | POA: Insufficient documentation

## 2022-01-29 DIAGNOSIS — Z9221 Personal history of antineoplastic chemotherapy: Secondary | ICD-10-CM | POA: Insufficient documentation

## 2022-01-29 DIAGNOSIS — Z8489 Family history of other specified conditions: Secondary | ICD-10-CM | POA: Diagnosis not present

## 2022-01-29 DIAGNOSIS — G4700 Insomnia, unspecified: Secondary | ICD-10-CM | POA: Diagnosis not present

## 2022-01-29 DIAGNOSIS — Z923 Personal history of irradiation: Secondary | ICD-10-CM | POA: Insufficient documentation

## 2022-01-29 DIAGNOSIS — F40240 Claustrophobia: Secondary | ICD-10-CM | POA: Insufficient documentation

## 2022-01-29 LAB — CBC WITH DIFFERENTIAL (CANCER CENTER ONLY)
Abs Immature Granulocytes: 0.02 10*3/uL (ref 0.00–0.07)
Basophils Absolute: 0.1 10*3/uL (ref 0.0–0.1)
Basophils Relative: 1 %
Eosinophils Absolute: 0.4 10*3/uL (ref 0.0–0.5)
Eosinophils Relative: 5 %
HCT: 40 % (ref 36.0–46.0)
Hemoglobin: 13.2 g/dL (ref 12.0–15.0)
Immature Granulocytes: 0 %
Lymphocytes Relative: 41 %
Lymphs Abs: 2.9 10*3/uL (ref 0.7–4.0)
MCH: 28.5 pg (ref 26.0–34.0)
MCHC: 33 g/dL (ref 30.0–36.0)
MCV: 86.4 fL (ref 80.0–100.0)
Monocytes Absolute: 0.5 10*3/uL (ref 0.1–1.0)
Monocytes Relative: 7 %
Neutro Abs: 3.2 10*3/uL (ref 1.7–7.7)
Neutrophils Relative %: 46 %
Platelet Count: 301 10*3/uL (ref 150–400)
RBC: 4.63 MIL/uL (ref 3.87–5.11)
RDW: 13.2 % (ref 11.5–15.5)
WBC Count: 7 10*3/uL (ref 4.0–10.5)
nRBC: 0 % (ref 0.0–0.2)

## 2022-01-29 LAB — CMP (CANCER CENTER ONLY)
ALT: 21 U/L (ref 0–44)
AST: 24 U/L (ref 15–41)
Albumin: 4.2 g/dL (ref 3.5–5.0)
Alkaline Phosphatase: 107 U/L (ref 38–126)
Anion gap: 8 (ref 5–15)
BUN: 10 mg/dL (ref 8–23)
CO2: 28 mmol/L (ref 22–32)
Calcium: 9.3 mg/dL (ref 8.9–10.3)
Chloride: 106 mmol/L (ref 98–111)
Creatinine: 0.8 mg/dL (ref 0.44–1.00)
GFR, Estimated: >60 mL/min (ref >60)
Glucose, Bld: 87 mg/dL (ref 70–99)
Potassium: 3.2 mmol/L — ABNORMAL LOW (ref 3.5–5.1)
Sodium: 142 mmol/L (ref 135–145)
Total Bilirubin: 0.5 mg/dL (ref 0.3–1.2)
Total Protein: 8 g/dL (ref 6.5–8.1)

## 2022-01-29 NOTE — Progress Notes (Signed)
[facility]   [phone] [phone]   Clinic Follow up Note   Patient Care Team: Leslie Andrea, MD as PCP - General (Family Medicine) Truitt Merle, MD as Consulting Physician (Hematology) Eppie Gibson, MD as Attending Physician (Radiation Oncology) Alla Feeling, NP as Nurse Practitioner (Nurse Practitioner) Virl Cagey, MD as Consulting Physician (General Surgery) Daneil Dolin, MD as Consulting Physician (Gastroenterology)  Date of Service:  01/29/2022  CHIEF COMPLAINT: f/u of left breast cancer  CURRENT THERAPY:  Surveillance  ASSESSMENT & PLAN:  Mary Wise is a 71 y.o. post-hysterectomy female with   1. 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  Proliferation marker Ki67 at 90% -diagnosed in 06/2017. S/p left lumpectomy, Adjuvant chemo AC-T and adjuvant radiation.  -most recent mammogram 12/20/21 was benign. -From a breast cancer standpoint, she is clinically doing well. Lab reviewed, her CBC and CMP are within normal limits. Physical exam was unremarkable aside from abdominal tenderness. There is no clinical concern for breast cancer recurrence. -f/u in 6 months    2. Headaches with vision and speech deficits -h/o headaches, on imitrex -she reports new onset vision and intermittent speech changes in the last few months and expressed concern due to family history of brain tumors. Her neuro exam was unremarkable today. -I will order brain MRI to rule out disease. She notes she has some metal fillings and is s/p knee replacement. She also reports she is very claustrophobic. Assuming she is able to proceed with MRI, we will work to get her scheduled at the open MRI machine.   3. Health Maintenance -last colonoscopy 01/15/21 with Dr. Jenetta Downer, one tubular adenoma, recall in 2029.   4. Anxiety, insomnia  -controlled on xanax and ambien PRN.    5.  Osteopenia -DEXA In 02/2019 shows osteopenia at the [facility] spine (T-score -1.1). Repeat 04/15/21 showed some worsening to -1.8 at left forearm (spine not measured) -Continue Vit D and exercise.    6. Genetic testing was negative   7. Arthralgia and muscle spasms -currently on tizanidine -s/p left knee replacement. She is followed by Dr. Fredonia Highland    Plan -brain MRI to be done soon, with phone visit several days later -lab and f/u in 6 months   No problem-specific Assessment & Plan notes found for this encounter.   SUMMARY OF ONCOLOGIC HISTORY: Oncology History Overview Note  Cancer Staging Malignant neoplasm of upper-outer quadrant of left breast in female, estrogen receptor negative ([place]) Staging form: Breast, AJCC 8th Edition - Pathologic stage from 08/19/2017: Stage IIA (pT2, pN0(sn), cM0, G3, ER: Negative, PR: Negative, HER2: Negative) - Signed by Ardath Sax, MD on 09/01/2017     Malignant neoplasm of upper-outer quadrant of left breast in female, estrogen receptor negative ([place])  06/16/2017 Initial Diagnosis   Malignant neoplasm of upper-outer quadrant of left breast in female: Initially presented with left deltoid mass which was consistent with lipoma.  During examination, a mass in the left breast was appreciated by palpation.   06/16/2017 Mammogram   BL Diagnostic Study: Irregular hyperdense mass in the upper outer quadrant of the left breast measuring 3.6 cm without radiographically detectable lymphadenopathy.  Normal appearance of the right breast.   06/16/2017 Breast US   3.2 cm irregular mass located at 2:30 o'clock, no pathological lymphadenopathy.   06/23/2017 Pathology Results   Ultrasound-guided biopsy: Positive for invasive ductal carcinoma, grade 3, ER 0%, PR  0%, HER-2 negative by [facility]   07/31/2017 Surgery   Partial left mastectomy with sentinel lymph node biopsy --Pathology: Invasive ductal carcinoma, measuring 4.5 cm, grade 3.  All margins negative, but  the anterior margin less than 0.1 cm.  No surrounding DCIS.  0/5 lymph nodes positive for malignancy   10/08/2017 PET scan   IMPRESSION: 1. No evidence breast cancer metastasis on FDG PET-CT scan. 2. Postsurgical change in the LEFT lateral breast and LEFT axilla.   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 Significance The following genes were evaluated for sequence changes and exonic deletions/duplications: ALK, APC, ATM, AXIN2, BAP1, BARD1, BLM, BMPR1A, BRCA1, BRCA2, BRIP1, CASR, CDC73, CDH1, CDK4, CDKN1B, CDKN1C, CDKN2A (p14ARF), CDKN2A (p16INK4a), CEBPA, CHEK2, CTNNA1, DICER1, DIS3L2, EPCAM*, FH, FLCN, GATA2, GPC3, GREM1*, HRAS, KIT, MAX, MEN1, MET, MLH1, MSH2, MSH3, MSH6, MUTYH, NBN, NF1, NF2, PALB2, PDGFRA, PHOX2B*, PMS2, POLD1, POLE, POT1, PRKAR1A, PTCH1, PTEN, RAD50, RAD51C, RAD51D, RB1, RECQL4, RET, RUNX1, SDHAF2, SDHB, SDHC, SDHD, SMAD4, SMARCA4, SMARCB1, SMARCE1, STK11, SUFU, TERC, TERT, TMEM127, TP53, TSC1, TSC2, VHL, WRN*, WT1 The following genes were evaluated for sequence changes only: EGFR*, HOXB13*, MITF*, NTHL1*, SDHA Results are negative unless otherwise indicated   05/27/2018 - 06/23/2018 Radiation Therapy   Radiation treatment dates:   05/27/2018 - 06/23/2018   Site/dose:    1. Left Breast / 40.05 Gy in 15 fractions 2. Left Breast Boost / 10 Gy in 5 fractions - Total dose of 50.05 Gy   Beams/energy:    1. 3D / 6X Photon 2. Isodose / 10X, 6X Photon   Narrative: The patient tolerated radiation treatment relatively well.  She experienced fatigue and some expected skin irritation with mild erythema and soreness to her breast/axilla. She is applying Radiaplex twice daily as directed. She also  mentioned bilateral hand cramping with bruising. She was advised to discuss this with her PCP as it is not related to her radiation treatment.    Survivorship   Per Cira Rue, NP       INTERVAL HISTORY:  Mary Wise is here for a follow up of breast cancer. She was last seen by me on 11/23/20. She presents to the clinic alone. She reports she is having occipital headaches, with associated vision changes and stuttering. She reports she is also still having abdominal tenderness, which she was told is a hernia.   All other systems were reviewed with the patient and are negative.  MEDICAL HISTORY:  Past Medical History:  Diagnosis Date   Anxiety    Asthma    Cancer ([place])    left breast cancer   Family history of cancer    Genetic testing 02/01/2018   Multi-Cancer panel (83 genes) @ [facility] - No pathogenic mutations detected   GERD (gastroesophageal reflux disease)    Hemorrhoids    History of radiation therapy 05/27/18- 06/23/18   Left Breast 40.05 Gy in 15 fractions, left breast boost 10 Gy in 5 fractions.    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    SURGICAL HISTORY: Past Surgical History:  Procedure Laterality Date   ABDOMINAL HYSTERECTOMY     APPENDECTOMY  BREAST LUMPECTOMY Left 2018   CATARACT EXTRACTION Bilateral    CESAREAN SECTION     CHOLECYSTECTOMY     COLONOSCOPY WITH PROPOFOL N/A 01/15/2021   Procedure: COLONOSCOPY WITH PROPOFOL;  Surgeon: Harvel Quale, MD;  Location: [facility];  Service: Gastroenterology;  Laterality: N/A;  10:00   ERCP     ESOPHAGEAL DILATION N/A 04/10/2021   Procedure: ESOPHAGEAL DILATION;  Surgeon: Rogene Houston, MD;  Location: [facility];  Service: Endoscopy;  Laterality: N/A;   ESOPHAGOGASTRODUODENOSCOPY (EGD) WITH PROPOFOL N/A 04/10/2021   Procedure: ESOPHAGOGASTRODUODENOSCOPY (EGD) WITH PROPOFOL;  Surgeon: Rogene Houston, MD;   Location: [facility];  Service: Endoscopy;  Laterality: N/A;  10:55   IR FLUORO GUIDE PORT INSERTION RIGHT  10/05/2017   IR REMOVAL TUN ACCESS W/ PORT W/O FL MOD SED  05/13/2018   IR US GUIDE VASC ACCESS RIGHT  10/05/2017   LIPOMA EXCISION Left 03/16/2018   left shoulder   LIVER SURGERY     MASS EXCISION N/A 03/30/2020   Procedure: EXCISION OF SEBACEOUS CYST ON LEFT NECK, SEBACEOUSE CYST ON BACK AND ATYPICAL MOLE ON RIGHT NECK, AND LEFT AXILLA SKIN TAG;  Surgeon: Virl Cagey, MD;  Location: [facility];  Service: General;  Laterality: N/A;   PARTIAL MASTECTOMY WITH AXILLARY SENTINEL LYMPH NODE BIOPSY Left 07/31/2017   Procedure: LEFT PARTIAL MASTECTOMY WITH AXILLARY SENTINEL LYMPH NODE BIOPSY;  Surgeon: Virl Cagey, MD;  Location: [facility];  Service: General;  Laterality: Left;   POLYPECTOMY  01/15/2021   Procedure: POLYPECTOMY;  Surgeon: Harvel Quale, MD;  Location: [facility];  Service: Gastroenterology;;   TOTAL KNEE ARTHROPLASTY Left 07/05/2019   Procedure: TOTAL KNEE ARTHROPLASTY;  Surgeon: Renette Butters, MD;  Location: [facility];  Service: Orthopedics;  Laterality: Left;    I have reviewed the social history and family history with the patient and they are unchanged from previous note.  ALLERGIES:  is allergic to onion, penicillins, sulfa antibiotics, yellow dye, tape, clindamycin/lincomycin, latex, and lipitor [atorvastatin calcium].  MEDICATIONS:  Current Outpatient Medications  Medication Sig Dispense Refill   albuterol (PROVENTIL) (2.5 MG/3ML) 0.083% nebulizer solution Take 3 mLs (2.5 mg total) by nebulization every 6 (six) hours as needed for wheezing or shortness of breath. 90 mL 3   albuterol (VENTOLIN HFA) 108 (90 Base) MCG/ACT inhaler INHALE 2 PUFFS BY MOUTH EVERY 6 HOURS AS NEEDED FOR WHEEZING FOR SHORTNESS OF BREATH (Patient taking differently: Inhale 2 puffs into the lungs every 6 (six) hours as needed for shortness of breath or wheezing.) 18 g 0    ALPRAZolam (XANAX) 1 MG tablet Take 1 tablet (1 mg total) by mouth 2 (two) times daily as needed. for anxiety (Patient taking differently: Take 1 mg by mouth 2 (two) times daily.) 60 tablet 0   ezetimibe (ZETIA) 10 MG tablet Take 10 mg by mouth daily.     fluticasone (FLOVENT HFA) 110 MCG/ACT inhaler Inhale 2 puffs into the lungs in the morning and at bedtime. (Patient taking differently: Inhale 2 puffs into the lungs in the morning.) 1 each 6   hyoscyamine (LEVSIN SL) 0.125 MG SL tablet Place 1 tablet (0.125 mg total) under the tongue 3 (three) times daily as needed. 90 tablet 1   ibuprofen (ADVIL) 800 MG tablet Take 1 tablet (800 mg total) by mouth every 8 (eight) hours as needed. (Patient taking differently: Take 400-800 mg by mouth every 8 (eight) hours as needed for moderate pain.) 30 tablet  3   lidocaine (LIDODERM) 5 % Place 0.5-1 patches onto the skin daily as needed (pain). Remove & Discard patch within 12 hours or as directed by MD     omeprazole (PRILOSEC) 20 MG capsule Take 1 capsule (20 mg total) by mouth 2 (two) times daily before a meal. 90 capsule 5   ondansetron (ZOFRAN) 4 MG tablet Take 4 mg by mouth every 8 (eight) hours as needed for nausea or vomiting.     Ophthalmic Irrigation Solution (EYE [place] OP) Place 1 application into both eyes 3 (three) times daily as needed (eye irritation).     potassium chloride (KLOR-CON) 10 MEQ tablet Take 1 tablet (10 mEq total) by mouth daily for 4 days. 4 tablet 0   simvastatin (ZOCOR) 20 MG tablet Take 1 tablet (20 mg total) by mouth 3 (three) times a week. 30 tablet 3   SUMAtriptan (IMITREX) 100 MG tablet May repeat in 2 hours if headache persists or recurs. (Patient taking differently: Take 100 mg by mouth every 2 (two) hours as needed for migraine (May repeat in 2 hours if headache persists or recurs.).) 9 tablet 0   tiZANidine (ZANAFLEX) 4 MG tablet Take 1 tablet (4 mg total) by mouth every 8 (eight) hours as needed for muscle spasms. 60 tablet  0   zolpidem (AMBIEN) 10 MG tablet Take 1 tablet (10 mg total) by mouth at bedtime as needed. for sleep 30 tablet 0   No current facility-administered medications for this visit.    PHYSICAL EXAMINATION: ECOG PERFORMANCE STATUS: 1 - Symptomatic but completely ambulatory  Vitals:   01/29/22 1417  BP: (!) 168/91  Pulse: 73  Resp: 18  Temp: 98.4 F (36.9 C)  SpO2: 97%   Wt Readings from Last 3 Encounters:  01/29/22 202 lb 6.4 oz (91.8 kg)  04/10/21 205 lb (93 kg)  04/05/21 200 lb (90.7 kg)     GENERAL:alert, no distress and comfortable SKIN: skin color, texture, turgor are normal, no rashes or significant lesions EYES: normal, Conjunctiva are pink and non-injected, sclera clear OROPHARYNX:no exudate, no erythema and lips, buccal mucosa, and tongue normal NECK: supple, thyroid normal size, non-tender, without nodularity LYMPH:  no palpable lymphadenopathy in the cervical, axillary ABDOMEN:abdomen soft, (+) tenderness Musculoskeletal:no cyanosis of digits and no clubbing  NEURO: alert & oriented x 3 with fluent speech, no focal motor/sensory deficits BREAST: No palpable mass, nodules or adenopathy bilaterally. Breast exam benign.   LABORATORY DATA:  I have reviewed the data as listed    Latest Ref Rng & Units 01/29/2022    1:50 PM 01/03/2022    5:37 PM 11/23/2020    2:37 PM  CBC  WBC 4.0 - 10.5 K/uL 7.0  8.0  7.1   Hemoglobin 12.0 - 15.0 g/dL 13.2  13.7  13.1   Hematocrit 36.0 - 46.0 % 40.0  41.6  40.1   Platelets 150 - 400 K/uL 301  270  301         Latest Ref Rng & Units 01/29/2022    1:50 PM 01/03/2022    5:37 PM 09/16/2021    7:59 AM  CMP  Glucose 70 - 99 mg/dL 87  97    BUN 8 - 23 mg/dL 10  10    Creatinine 0.44 - 1.00 mg/dL 0.80  0.79  0.90   Sodium 135 - 145 mmol/L 142  139    Potassium 3.5 - 5.1 mmol/L 3.2  2.9    Chloride 98 - 111 mmol/L  106  103    CO2 22 - 32 mmol/L 28  26    Calcium 8.9 - 10.3 mg/dL 9.3  8.9    Total Protein 6.5 - 8.1 g/dL 8.0     Total  Bilirubin 0.3 - 1.2 mg/dL 0.5     Alkaline Phos 38 - 126 U/L 107     AST 15 - 41 U/L 24     ALT 0 - 44 U/L 21         RADIOGRAPHIC STUDIES: I have personally reviewed the radiological images as listed and agreed with the findings in the report. No results found.    Orders Placed This Encounter  Procedures   MR Brain W Wo Contrast    Standing Status:   Future    Standing Expiration Date:   01/29/2023    Order Specific Question:   If indicated for the ordered procedure, I authorize the administration of contrast media per Radiology protocol    Answer:   Yes    Order Specific Question:   What is the patient's sedation requirement?    Answer:   No Sedation    Order Specific Question:   Does the patient have a pacemaker or implanted devices?    Answer:   No    Order Specific Question:   Use SRS Protocol?    Answer:   No    Order Specific Question:   Preferred imaging location?    Answer:   [facility] (table limit-550lbs)   All questions were answered. The patient knows to call the clinic with any problems, questions or concerns. No barriers to learning was detected. The total time spent in the appointment was 30 minutes.     Truitt Merle, MD 01/29/2022   I, Wilburn Mylar, am acting as scribe for Truitt Merle, MD.   I have reviewed the above documentation for accuracy and completeness, and I agree with the above.

## 2022-01-30 ENCOUNTER — Telehealth: Payer: Self-pay | Admitting: Hematology

## 2022-01-30 NOTE — Telephone Encounter (Signed)
Scheduled per 6/28 los, pt has been called and confirmed

## 2022-02-13 ENCOUNTER — Ambulatory Visit
Admission: RE | Admit: 2022-02-13 | Discharge: 2022-02-13 | Disposition: A | Discharge status: Home / Self Care | Payer: Medicare Other | Source: Ambulatory Visit | Attending: Hematology | Admitting: Hematology

## 2022-02-13 DIAGNOSIS — C50412 Malignant neoplasm of upper-outer quadrant of left female breast: Secondary | ICD-10-CM

## 2022-02-14 ENCOUNTER — Telehealth: Payer: Self-pay

## 2022-02-14 NOTE — Telephone Encounter (Signed)
Pt called stating she was unable to complete her recent MRI of her brain.  Pt is tearful and highly upset because she does not know what can be done now since she couldn't complete her test.  Pt want's Dr. Burr Medico to give her a call.

## 2022-02-17 ENCOUNTER — Inpatient Hospital Stay: Payer: Medicare Other | Admitting: Hematology

## 2022-04-10 ENCOUNTER — Ambulatory Visit: Payer: Medicare Other | Attending: Cardiology

## 2022-04-10 ENCOUNTER — Ambulatory Visit: Payer: Medicare Other | Attending: Cardiology | Admitting: Cardiology

## 2022-04-10 ENCOUNTER — Encounter: Payer: Self-pay | Admitting: Cardiology

## 2022-04-10 VITALS — BP 174/100 | HR 76 | Ht 68.0 in | Wt 203.0 lb

## 2022-04-10 DIAGNOSIS — I10 Essential (primary) hypertension: Secondary | ICD-10-CM | POA: Diagnosis not present

## 2022-04-10 DIAGNOSIS — R002 Palpitations: Secondary | ICD-10-CM | POA: Insufficient documentation

## 2022-04-10 DIAGNOSIS — E669 Obesity, unspecified: Secondary | ICD-10-CM

## 2022-04-10 DIAGNOSIS — R0602 Shortness of breath: Secondary | ICD-10-CM

## 2022-04-10 DIAGNOSIS — R011 Cardiac murmur, unspecified: Secondary | ICD-10-CM

## 2022-04-10 DIAGNOSIS — E785 Hyperlipidemia, unspecified: Secondary | ICD-10-CM | POA: Diagnosis present

## 2022-04-10 MED ORDER — LOSARTAN POTASSIUM 50 MG PO TABS: ORAL_TABLET | ORAL | 3 refills | Status: DC

## 2022-04-10 NOTE — Progress Notes (Signed)
Cardiology Office Note:    Date:  04/10/2022   ID:  Mary Wise, DOB 02/09/1951, MRN [id number]  PCP:  Leslie Andrea, MD  Cardiologist:  Berniece Salines, DO  Electrophysiologist:  None   Referring MD: Leslie Andrea, MD   Chief Complaint  Patient presents with   Chest Pain   Shortness of Breath   History of Present Illness:    Mary Wise is a 71 y.o. female with a hx of hypertension, hyperlipidemia, history of breast cancer status post lumpectomy, chemo and radiation, anxiety, osteopenia.  The patient tells me that she has been experiencing intermittent palpitations, she notes that this has been going on for some time now.  She describes as a fluttering sensation that comes off and on.  Is about 6-9 times a day.  She notes that she experiences recently this morning and they had improved.  She admits to intermittent shortness of breath.  But she contributes this to her asthma.  But now she is having some chest pain she tells me when she lies flat she feels that but no other times.   Past Medical History:  Diagnosis Date   Anxiety    Asthma    Cancer ([place])    left breast cancer   Family history of cancer    Genetic testing 02/01/2018   Multi-Cancer panel (83 genes) @ [facility] - No pathogenic mutations detected   GERD (gastroesophageal reflux disease)    Hemorrhoids    History of radiation therapy 05/27/18- 06/23/18   Left Breast 40.05 Gy in 15 fractions, left breast boost 10 Gy in 5 fractions.    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    Past Surgical History:  Procedure Laterality Date   ABDOMINAL HYSTERECTOMY     APPENDECTOMY     BREAST LUMPECTOMY Left 2018   CATARACT EXTRACTION Bilateral    CESAREAN SECTION     CHOLECYSTECTOMY     COLONOSCOPY WITH PROPOFOL N/A 01/15/2021   Procedure: COLONOSCOPY WITH PROPOFOL;  Surgeon: Harvel Quale, MD;  [facility];   Service: Gastroenterology;  Laterality: N/A;  10:00   ERCP     ESOPHAGEAL DILATION N/A 04/10/2021   Procedure: ESOPHAGEAL DILATION;  Surgeon: Rogene Houston, MD;  [facility];  Service: Endoscopy;  Laterality: N/A;   ESOPHAGOGASTRODUODENOSCOPY (EGD) WITH PROPOFOL N/A 04/10/2021   Procedure: ESOPHAGOGASTRODUODENOSCOPY (EGD) WITH PROPOFOL;  Surgeon: Rogene Houston, MD;  [facility];  Service: Endoscopy;  Laterality: N/A;  10:55   IR FLUORO GUIDE PORT INSERTION RIGHT  10/05/2017   IR REMOVAL TUN ACCESS W/ PORT W/O FL MOD SED  05/13/2018   IR US GUIDE VASC ACCESS RIGHT  10/05/2017   LIPOMA EXCISION Left 03/16/2018   left shoulder   LIVER SURGERY     MASS EXCISION N/A 03/30/2020   Procedure: EXCISION OF SEBACEOUS CYST ON LEFT NECK, SEBACEOUSE CYST ON BACK AND ATYPICAL MOLE ON RIGHT NECK, AND LEFT AXILLA SKIN TAG;  Surgeon: Virl Cagey, MD;  Location: [facility];  Service: General;  Laterality: N/A;   PARTIAL MASTECTOMY WITH AXILLARY SENTINEL LYMPH NODE BIOPSY Left 07/31/2017   Procedure: LEFT PARTIAL MASTECTOMY WITH AXILLARY SENTINEL LYMPH NODE BIOPSY;  Surgeon: Virl Cagey, MD;  Location: [facility];  Service: General;  Laterality: Left;   POLYPECTOMY  01/15/2021   Procedure: POLYPECTOMY;  Surgeon: Montez Morita, Quillian Quince, MD;  [facility];  Service: Gastroenterology;;   TOTAL KNEE ARTHROPLASTY Left 07/05/2019   Procedure: TOTAL KNEE ARTHROPLASTY;  Surgeon: Renette Butters, MD;  Location: [facility];  Service: Orthopedics;  Laterality: Left;    Current Medications: Current Meds  Medication Sig   albuterol (PROVENTIL) (2.5 MG/3ML) 0.083% nebulizer solution Take 3 mLs (2.5 mg total) by nebulization every 6 (six) hours as needed for wheezing or shortness of breath.   albuterol (VENTOLIN HFA) 108 (90 Base) MCG/ACT inhaler INHALE 2 PUFFS BY MOUTH EVERY 6 HOURS AS NEEDED FOR WHEEZING FOR SHORTNESS OF BREATH (Patient taking differently: Inhale 2 puffs into the  lungs every 6 (six) hours as needed for shortness of breath or wheezing.)   ALPRAZolam (XANAX) 1 MG tablet Take 1 tablet (1 mg total) by mouth 2 (two) times daily as needed. for anxiety (Patient taking differently: Take 1 mg by mouth 2 (two) times daily.)   ezetimibe (ZETIA) 10 MG tablet Take 10 mg by mouth daily.   fluticasone (FLOVENT HFA) 110 MCG/ACT inhaler Inhale 2 puffs into the lungs in the morning and at bedtime. (Patient taking differently: Inhale 2 puffs into the lungs in the morning.)   hyoscyamine (LEVSIN SL) 0.125 MG SL tablet Place 1 tablet (0.125 mg total) under the tongue 3 (three) times daily as needed.   ibuprofen (ADVIL) 800 MG tablet Take 1 tablet (800 mg total) by mouth every 8 (eight) hours as needed. (Patient taking differently: Take 400-800 mg by mouth every 8 (eight) hours as needed for moderate pain.)   losartan (COZAAR) 50 MG tablet Take 50 mg (one tablet) in the morning, take 25 mg (half tablet) at night.   omeprazole (PRILOSEC) 20 MG capsule Take 1 capsule (20 mg total) by mouth 2 (two) times daily before a meal.   ondansetron (ZOFRAN) 4 MG tablet Take 4 mg by mouth every 8 (eight) hours as needed for nausea or vomiting.   Ophthalmic Irrigation Solution (EYE [place] OP) Place 1 application into both eyes 3 (three) times daily as needed (eye irritation).   SUMAtriptan (IMITREX) 100 MG tablet May repeat in 2 hours if headache persists or recurs. (Patient taking differently: Take 100 mg by mouth every 2 (two) hours as needed for migraine (May repeat in 2 hours if headache persists or recurs.).)   tiZANidine (ZANAFLEX) 4 MG tablet Take 1 tablet (4 mg total) by mouth every 8 (eight) hours as needed for muscle spasms.   zolpidem (AMBIEN) 10 MG tablet Take 1 tablet (10 mg total) by mouth at bedtime as needed. for sleep     Allergies:   Onion, Penicillins, Sulfa antibiotics, Yellow dye, Tape, Clindamycin/lincomycin, Latex, and Lipitor [atorvastatin calcium]   Social History    Socioeconomic History   Marital status: Single    Spouse name: Not on file   Number of children: Not on file   Years of education: Not on file   Highest education level: Not on file  Occupational History   Not on file  Tobacco Use   Smoking status: Never   Smokeless tobacco: Never  Vaping Use   Vaping Use: Never used  Substance and Sexual Activity   Alcohol use: No   Drug use: Never   Sexual activity: Never  Other Topics Concern   Not on file  Social History Narrative   Lives in [place] [address]    Social Determinants of Health   Financial Resource Strain: Not on file  Food Insecurity: Not on file  Transportation Needs: No Transportation Needs (07/23/2018)   PRAPARE - Hydrologist (Medical): No    Lack of Transportation (Non-Medical): No  Physical Activity: Not on file  Stress: Not on file  Social Connections: Not on file     Family History: The patient's family history includes Breast cancer in her maternal grandmother; Cancer in her paternal aunt, paternal grandfather, paternal grandmother, paternal uncle, paternal uncle, paternal uncle, and another family member; Cancer (age of onset: 23) in her mother; Cancer (age of onset: 28) in her father; Diabetes in her brother, sister, sister, and sister; Lung cancer in her maternal aunt; Melanoma in an other family member; Ovarian cancer (age of onset: 10) in her sister; Stomach cancer in her cousin; Stomach cancer (age of onset: 27) in her maternal aunt.  ROS:   Review of Systems  Constitution: Negative for decreased appetite, fever and weight gain.  HENT: Negative for congestion, ear discharge, hoarse voice and sore throat.   Eyes: Negative for discharge, redness, vision loss in right eye and visual halos.  Cardiovascular: Negative for chest pain, dyspnea on exertion, leg swelling, orthopnea and palpitations.  Respiratory: Negative for cough, hemoptysis, shortness of breath and  snoring.   Endocrine: Negative for heat intolerance and polyphagia.  Hematologic/Lymphatic: Negative for bleeding problem. Does not bruise/bleed easily.  Skin: Negative for flushing, nail changes, rash and suspicious lesions.  Musculoskeletal: Negative for arthritis, joint pain, muscle cramps, myalgias, neck pain and stiffness.  Gastrointestinal: Negative for abdominal pain, bowel incontinence, diarrhea and excessive appetite.  Genitourinary: Negative for decreased libido, genital sores and incomplete emptying.  Neurological: Negative for brief paralysis, focal weakness, headaches and loss of balance.  Psychiatric/Behavioral: Negative for altered mental status, depression and suicidal ideas.  Allergic/Immunologic: Negative for HIV exposure and persistent infections.    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  Transthoracic echocardiogram March 2019 Study Conclusions  - Left ventricle: The cavity size was normal. There was moderate    concentric hypertrophy. Systolic function was normal. The    estimated ejection fraction was in the range of 55% to 60%. Wall    motion was normal; there were no regional wall motion    abnormalities. Doppler parameters are consistent with abnormal    left ventricular relaxation (grade 1 diastolic dysfunction).    There was no evidence of elevated ventricular filling pressure by    Doppler parameters.  - Aortic valve: Trileaflet; normal thickness leaflets. There was no    regurgitation.  - Aortic root: The aortic root was normal in size.  - Mitral valve: There was no regurgitation.  - Right ventricle: Systolic function was normal.  - Right atrium: The atrium was normal in size.  - Tricuspid valve: There was no regurgitation.  - Pulmonic valve: There was no regurgitation.  -  Pulmonary arteries: Systolic pressure could not be accurately    estimated.  - Inferior vena cava: The vessel was normal in size.  - Pericardium, extracardiac: There was no pericardial effusion   Recent Labs: 01/03/2022: Magnesium 1.8 01/29/2022: ALT 21; BUN 10; Creatinine 0.80; Hemoglobin 13.2; Platelet Count 301; Potassium 3.2; Sodium 142  Recent Lipid Panel    Component Value Date/Time   CHOL 229 (H) 09/10/2020 1428   TRIG 115 09/10/2020 1428   HDL  59 09/10/2020 1428   CHOLHDL 3.9 09/10/2020 1428   LDLCALC 147 (H) 09/10/2020 1428    Physical Exam:    VS:  BP (!) 174/100 (BP Location: Right Arm, Patient Position: Sitting)   Pulse 76   Ht '5\' 8"'$  (1.727 m)   Wt 203 lb (92.1 kg)   BMI 30.87 kg/m     Wt Readings from Last 3 Encounters:  04/10/22 203 lb (92.1 kg)  01/29/22 202 lb 6.4 oz (91.8 kg)  04/10/21 205 lb (93 kg)     GEN: Well nourished, well developed in no acute distress HEENT: Normal NECK: No JVD; No carotid bruits LYMPHATICS: No lymphadenopathy CARDIAC: S1S2 noted,RRR, 2/6 murmurs, rubs, gallops RESPIRATORY:  Clear to auscultation without rales, wheezing or rhonchi  ABDOMEN: Soft, non-tender, non-distended, +bowel sounds, no guarding. EXTREMITIES: No edema, No cyanosis, no clubbing MUSCULOSKELETAL:  No deformity  SKIN: Warm and dry NEUROLOGIC:  Alert and oriented x 3, non-focal PSYCHIATRIC:  Normal affect, good insight  ASSESSMENT:    1. Murmur, cardiac   2. SOB (shortness of breath)   3. Palpitations   4. HTN, goal below 140/90   5. Hyperlipidemia, unspecified hyperlipidemia type   6. Obesity (BMI 30-39.9)    PLAN:    She is short of breath as well as with noted midsystolic ejection murmur concerning for aortic stenosis or sclerosis we will order an echocardiogram to assess any flow abnormalities.  With her symptoms of shortness of breath, chest pain abnormal EKG once her echocardiogram is done and unable to get some understanding of the valve I plan  to send the patient for ischemic evaluation: Coronary CTA versus heart catheterization based on results.  If there are no significant/severe aortic stenosis we will move forward with a coronary CTA.  I would like to rule out a cardiovascular etiology of this palpitation, therefore at this time I would like to placed a zio patch for 14 days.   Her blood pressure is elevated in the office today-manually taken by me.  She tells me she takes losartan 50 mg daily we will increase this medication to 50 mg in the morning and 25 mg at night.  She has had some allergies to 2 prior antihypertensive medications but she could not remember the names.  The patient understands the need to lose weight with diet and exercise. We have discussed specific strategies for this.  The patient is in agreement with the above plan. The patient left the office in stable condition.  The patient will follow up in   Medication Adjustments/Labs and Tests Ordered: Current medicines are reviewed at length with the patient today.  Concerns regarding medicines are outlined above.  Orders Placed This Encounter  Procedures   LONG TERM MONITOR (3-14 DAYS)   EKG 12-Lead   ECHOCARDIOGRAM COMPLETE   Meds ordered this encounter  Medications   losartan (COZAAR) 50 MG tablet    Sig: Take 50 mg (one tablet) in the morning, take 25 mg (half tablet) at night.    Dispense:  135 tablet    Refill:  3    Patient Instructions  Medication Instructions:  Your physician has recommended you make the following change in your medication:  INCREASE: Losartan 50 mg in the morning and 25 mg at night.   Please take your blood pressure daily for 1 week and record. Please include heart rates (pulse).   HOW TO TAKE YOUR BLOOD PRESSURE: Rest 5 minutes before taking your blood pressure. Don't smoke or drink  caffeinated beverages for at least 30 minutes before. Take your blood pressure before (not after) you eat. Sit comfortably with your back  supported and both feet on the floor (don't cross your legs). Elevate your arm to heart level on a table or a desk. Use the proper sized cuff. It should fit smoothly and snugly around your bare upper arm. There should be enough room to slip a fingertip under the cuff. The bottom edge of the cuff should be 1 inch above the crease of the elbow. Ideally, take 3 measurements at one sitting and record the average.  *If you need a refill on your cardiac medications before your next appointment, please call your pharmacy*   Lab Work: None   Testing/Procedures: Your physician has requested that you have an echocardiogram. Echocardiography is a painless test that uses sound waves to create images of your heart. It provides your doctor with information about the size and shape of your heart and how well your heart's chambers and valves are working. This procedure takes approximately one hour. There are no restrictions for this procedure.  ZIO XT- Long Term Monitor Instructions  Your physician has requested you wear a ZIO patch monitor for 14 days.  This is a single patch monitor. [employer] supplies one patch monitor per enrollment. Additional stickers are not available. Please do not apply patch if you will be having a Nuclear Stress Test,  Echocardiogram, Cardiac CT, MRI, or Chest Xray during the period you would be wearing the  monitor. The patch cannot be worn during these tests. You cannot remove and re-apply the  ZIO XT patch monitor.  Your ZIO patch monitor will be mailed 3 day USPS to your address on file. It may take 3-5 days  to receive your monitor after you have been enrolled.  Once you have received your monitor, please review the enclosed instructions. Your monitor  has already been registered assigning a specific monitor serial # to you.  Billing and Patient Assistance Program Information  We have supplied [employer] with any of your insurance information on file for billing  purposes. [employer] offers a sliding scale Patient Assistance Program for patients that do not have  insurance, or whose insurance does not completely cover the cost of the ZIO monitor.  You must apply for the Patient Assistance Program to qualify for this discounted rate.  To apply, please call [employer] at [phone], select option 4, select option 2, ask to apply for  Patient Assistance Program. Theodore Demark will ask your household income, and how many people  are in your household. They will quote your out-of-pocket cost based on that information.  [employer] will also be able to set up a 3-month interest-free payment plan if needed.  Applying the monitor   Shave hair from upper left chest.  Hold abrader disc by orange tab. Rub abrader in 40 strokes over the upper left chest as  indicated in your monitor instructions.  Clean area with 4 enclosed alcohol pads. Let dry.  Apply patch as indicated in monitor instructions. Patch will be placed under collarbone on left  side of chest with arrow pointing upward.  Rub patch adhesive wings for 2 minutes. Remove white label marked "1". Remove the white  label marked "2". Rub patch adhesive wings for 2 additional minutes.  While looking in a mirror, press and release button in center of patch. A small green light will  flash 3-4 times. This will be your only indicator that the monitor has been  turned on.  Do not shower for the first 24 hours. You may shower after the first 24 hours.  Press the button if you feel a symptom. You will hear a small click. Record Date, Time and  Symptom in the Patient Logbook.  When you are ready to remove the patch, follow instructions on the last 2 pages of Patient  Logbook. Stick patch monitor onto the last page of Patient Logbook.  Place Patient Logbook in the blue and white box. Use locking tab on box and tape box closed  securely. The blue and white box has prepaid postage on it. Please place it in the mailbox as  soon  as possible. Your physician should have your test results approximately 7 days after the  monitor has been mailed back to [facility].  Call [facility] at [phone] if you have questions regarding  your ZIO XT patch monitor. Call them immediately if you see an orange light blinking on your  monitor.  If your monitor falls off in less than 4 days, contact our Monitor department at [phone].  If your monitor becomes loose or falls off after 4 days call [employer] at [phone] for  suggestions on securing your monitor    Follow-Up: At [facility], you and your health needs are our priority.  As part of our continuing mission to provide you with exceptional heart care, we have created designated Provider Care Teams.  These Care Teams include your primary Cardiologist (physician) and Advanced Practice Providers (APPs -  Physician Assistants and Nurse Practitioners) who all work together to provide you with the care you need, when you need it.  We recommend signing up for the patient portal called "MyChart".  Sign up information is provided on this After Visit Summary.  MyChart is used to connect with patients for Virtual Visits (Telemedicine).  Patients are able to view lab/test results, encounter notes, upcoming appointments, etc.  Non-urgent messages can be sent to your provider as well.   To learn more about what you can do with MyChart, go to [email or web address].    Your next appointment:   4 month(s)  The format for your next appointment:   In Person  Provider:   Berniece Salines, DO     Other Instructions   Important Information About Sugar         Adopting a Healthy Lifestyle.  Know what a healthy weight is for you (roughly BMI <25) and aim to maintain this   Aim for 7+ servings of fruits and vegetables daily   65-80+ fluid ounces of water or unsweet tea for healthy kidneys   Limit to max 1 drink of alcohol per day; avoid  smoking/tobacco   Limit animal fats in diet for cholesterol and heart health - choose grass fed whenever available   Avoid highly processed foods, and foods high in saturated/trans fats   Aim for low stress - take time to unwind and care for your mental health   Aim for 150 min of moderate intensity exercise weekly for heart health, and weights twice weekly for bone health   Aim for 7-9 hours of sleep daily   When it comes to diets, agreement about the perfect plan isnt easy to find, even among the experts. Experts at the [place] [employer] developed an idea known as the Healthy Eating Plate. Just imagine a plate divided into logical, healthy portions.   The emphasis is on diet quality:   Load up on  vegetables and fruits - one-half of your plate: Aim for color and variety, and remember that potatoes dont count.   Go for whole grains - one-quarter of your plate: Whole wheat, barley, wheat berries, quinoa, oats, brown rice, and foods made with them. If you want pasta, go with whole wheat pasta.   Protein power - one-quarter of your plate: Fish, chicken, beans, and nuts are all healthy, versatile protein sources. Limit red meat.   The diet, however, does go beyond the plate, offering a few other suggestions.   Use healthy plant oils, such as olive, canola, soy, corn, sunflower and peanut. Check the labels, and avoid partially hydrogenated oil, which have unhealthy trans fats.   If youre thirsty, drink water. Coffee and tea are good in moderation, but skip sugary drinks and limit milk and dairy products to one or two daily servings.   The type of carbohydrate in the diet is more important than the amount. Some sources of carbohydrates, such as vegetables, fruits, whole grains, and beans-are healthier than others.   Finally, stay active  Signed, Berniece Salines, DO  04/10/2022 2:46 PM    [facility]

## 2022-04-10 NOTE — Progress Notes (Unsigned)
Enrolled for [employer] to mail a ZIO XT long term holter monitor to the patients address on file.  

## 2022-04-10 NOTE — Patient Instructions (Addendum)
Medication Instructions:  Your physician has recommended you make the following change in your medication:  INCREASE: Losartan 50 mg in the morning and 25 mg at night.   Please take your blood pressure daily for 1 week and record. Please include heart rates (pulse).   HOW TO TAKE YOUR BLOOD PRESSURE: Rest 5 minutes before taking your blood pressure. Don't smoke or drink caffeinated beverages for at least 30 minutes before. Take your blood pressure before (not after) you eat. Sit comfortably with your back supported and both feet on the floor (don't cross your legs). Elevate your arm to heart level on a table or a desk. Use the proper sized cuff. It should fit smoothly and snugly around your bare upper arm. There should be enough room to slip a fingertip under the cuff. The bottom edge of the cuff should be 1 inch above the crease of the elbow. Ideally, take 3 measurements at one sitting and record the average.  *If you need a refill on your cardiac medications before your next appointment, please call your pharmacy*   Lab Work: None   Testing/Procedures: Your physician has requested that you have an echocardiogram. Echocardiography is a painless test that uses sound waves to create images of your heart. It provides your doctor with information about the size and shape of your heart and how well your heart's chambers and valves are working. This procedure takes approximately one hour. There are no restrictions for this procedure.  ZIO XT- Long Term Monitor Instructions  Your physician has requested you wear a ZIO patch monitor for 14 days.  This is a single patch monitor. [employer] supplies one patch monitor per enrollment. Additional stickers are not available. Please do not apply patch if you will be having a Nuclear Stress Test,  Echocardiogram, Cardiac CT, MRI, or Chest Xray during the period you would be wearing the  monitor. The patch cannot be worn during these tests. You cannot  remove and re-apply the  ZIO XT patch monitor.  Your ZIO patch monitor will be mailed 3 day USPS to your address on file. It may take 3-5 days  to receive your monitor after you have been enrolled.  Once you have received your monitor, please review the enclosed instructions. Your monitor  has already been registered assigning a specific monitor serial # to you.  Billing and Patient Assistance Program Information  We have supplied [employer] with any of your insurance information on file for billing purposes. [employer] offers a sliding scale Patient Assistance Program for patients that do not have  insurance, or whose insurance does not completely cover the cost of the ZIO monitor.  You must apply for the Patient Assistance Program to qualify for this discounted rate.  To apply, please call [employer] at [phone], select option 4, select option 2, ask to apply for  Patient Assistance Program. Theodore Demark will ask your household income, and how many people  are in your household. They will quote your out-of-pocket cost based on that information.  [employer] will also be able to set up a 66-month interest-free payment plan if needed.  Applying the monitor   Shave hair from upper left chest.  Hold abrader disc by orange tab. Rub abrader in 40 strokes over the upper left chest as  indicated in your monitor instructions.  Clean area with 4 enclosed alcohol pads. Let dry.  Apply patch as indicated in monitor instructions. Patch will be placed under collarbone on left  side of chest with  arrow pointing upward.  Rub patch adhesive wings for 2 minutes. Remove white label marked "1". Remove the white  label marked "2". Rub patch adhesive wings for 2 additional minutes.  While looking in a mirror, press and release button in center of patch. A small green light will  flash 3-4 times. This will be your only indicator that the monitor has been turned on.  Do not shower for the first 24 hours. You may shower  after the first 24 hours.  Press the button if you feel a symptom. You will hear a small click. Record Date, Time and  Symptom in the Patient Logbook.  When you are ready to remove the patch, follow instructions on the last 2 pages of Patient  Logbook. Stick patch monitor onto the last page of Patient Logbook.  Place Patient Logbook in the blue and white box. Use locking tab on box and tape box closed  securely. The blue and white box has prepaid postage on it. Please place it in the mailbox as  soon as possible. Your physician should have your test results approximately 7 days after the  monitor has been mailed back to [facility].  Call [facility] at [phone] if you have questions regarding  your ZIO XT patch monitor. Call them immediately if you see an orange light blinking on your  monitor.  If your monitor falls off in less than 4 days, contact our Monitor department at [phone].  If your monitor becomes loose or falls off after 4 days call [employer] at [phone] for  suggestions on securing your monitor    Follow-Up: At [facility], you and your health needs are our priority.  As part of our continuing mission to provide you with exceptional heart care, we have created designated Provider Care Teams.  These Care Teams include your primary Cardiologist (physician) and Advanced Practice Providers (APPs -  Physician Assistants and Nurse Practitioners) who all work together to provide you with the care you need, when you need it.  We recommend signing up for the patient portal called "MyChart".  Sign up information is provided on this After Visit Summary.  MyChart is used to connect with patients for Virtual Visits (Telemedicine).  Patients are able to view lab/test results, encounter notes, upcoming appointments, etc.  Non-urgent messages can be sent to your provider as well.   To learn more about what you can do with MyChart, go to  [email or web address].    Your next appointment:   4 month(s)  The format for your next appointment:   In Person  Provider:   Berniece Salines, DO     Other Instructions   Important Information About Sugar

## 2022-04-15 ENCOUNTER — Telehealth: Payer: Self-pay | Admitting: Cardiology

## 2022-04-15 DIAGNOSIS — E7489 Other specified disorders of carbohydrate metabolism: Secondary | ICD-10-CM

## 2022-04-15 DIAGNOSIS — E8881 Metabolic syndrome: Secondary | ICD-10-CM

## 2022-04-15 NOTE — Telephone Encounter (Signed)
Pt would like to see if Dr Harriet Masson put in a lab to check her A1C. She stated they mentioned that during the visit-sas

## 2022-04-16 ENCOUNTER — Other Ambulatory Visit: Payer: Self-pay

## 2022-04-16 ENCOUNTER — Other Ambulatory Visit (HOSPITAL_BASED_OUTPATIENT_CLINIC_OR_DEPARTMENT_OTHER): Payer: Medicare Other

## 2022-04-16 ENCOUNTER — Ambulatory Visit (HOSPITAL_COMMUNITY)
Admission: RE | Admit: 2022-04-16 | Discharge: 2022-04-16 | Disposition: A | Discharge status: Home / Self Care | Payer: Medicare Other | Source: Ambulatory Visit | Attending: Cardiology | Admitting: Cardiology

## 2022-04-16 DIAGNOSIS — E8881 Metabolic syndrome: Secondary | ICD-10-CM

## 2022-04-16 DIAGNOSIS — R0602 Shortness of breath: Secondary | ICD-10-CM

## 2022-04-16 DIAGNOSIS — Z131 Encounter for screening for diabetes mellitus: Secondary | ICD-10-CM

## 2022-04-16 DIAGNOSIS — E7489 Other specified disorders of carbohydrate metabolism: Secondary | ICD-10-CM

## 2022-04-16 DIAGNOSIS — E669 Obesity, unspecified: Secondary | ICD-10-CM

## 2022-04-16 DIAGNOSIS — E782 Mixed hyperlipidemia: Secondary | ICD-10-CM

## 2022-04-16 LAB — ECHOCARDIOGRAM COMPLETE
Area-P 1/2: 2.42 cm2
S' Lateral: 3 cm

## 2022-04-16 NOTE — Telephone Encounter (Addendum)
Order for A1c placed. Left message for patient to call. Lab slip malied to patient's home.

## 2022-04-16 NOTE — Progress Notes (Signed)
*  PRELIMINARY RESULTS* Echocardiogram 2D Echocardiogram has been performed.  Mary Wise 04/16/2022, 3:35 PM

## 2022-04-17 DIAGNOSIS — R002 Palpitations: Secondary | ICD-10-CM

## 2022-04-18 ENCOUNTER — Telehealth: Payer: Self-pay | Admitting: Cardiology

## 2022-04-18 NOTE — Telephone Encounter (Signed)
Left message to call back  

## 2022-04-18 NOTE — Telephone Encounter (Signed)
Pt called in with BP readings   9/8 2pm-165/98 hr 76 8 am-145/91 hr 78  9/9 2:30 pm- 166/93 hr 78 8:30 pm-133/83 hr 71  9/10 1:20 pm-144/92 hr 87 5:30 pm-135/81 hr 89  9/11 10am-142/83 hr 79 7 pm-138/80 hr 89  9/12 10 am-142/83 hr 79 7 pm- 138/80 hr 89  9/13 1 pm-141/95 hr 77 8 pm -143/93 hr 77  9/14 10 am-129/87 hr 90 5 pm-132/99 hr 100

## 2022-04-23 LAB — HEMOGLOBIN A1C
Est. average glucose Bld gHb Est-mCnc: 111 mg/dL
Hgb A1c MFr Bld: 5.5 % (ref 4.8–5.6)

## 2022-04-24 NOTE — Telephone Encounter (Signed)
Spoke with pt, she has been taking losartan 75 mg once daily since 04/10/22 and this is how her blood pressure has been running since then. Aware will make dr tobb aware.

## 2022-04-28 MED ORDER — LOSARTAN POTASSIUM 100 MG PO TABS: ORAL_TABLET | ORAL | 3 refills | Status: DC

## 2022-04-28 NOTE — Telephone Encounter (Signed)
Left message for pt to call.

## 2022-04-28 NOTE — Telephone Encounter (Signed)
Called and left detailed message for pt to start taking Losartan 100 mg once daily. Pt informed to call us back to let us know she received the message. Prescription sent to pharmacy.

## 2022-04-29 NOTE — Telephone Encounter (Signed)
Patient is returning call to review results/discuss instructions.

## 2022-04-29 NOTE — Telephone Encounter (Signed)
Spoke to patient stated she started Losartan 100 mg daily yesterday.Advised A1C was normal.I will make Dr.Tobb's RN aware.

## 2022-06-25 ENCOUNTER — Encounter: Payer: Self-pay | Admitting: Cardiology

## 2022-06-25 ENCOUNTER — Ambulatory Visit: Payer: Medicare Other | Attending: Cardiology | Admitting: Cardiology

## 2022-06-25 VITALS — BP 144/92 | HR 100 | Ht 68.0 in | Wt 198.8 lb

## 2022-06-25 DIAGNOSIS — I10 Essential (primary) hypertension: Secondary | ICD-10-CM | POA: Diagnosis not present

## 2022-06-25 DIAGNOSIS — I4729 Other ventricular tachycardia: Secondary | ICD-10-CM | POA: Insufficient documentation

## 2022-06-25 DIAGNOSIS — I471 Supraventricular tachycardia, unspecified: Secondary | ICD-10-CM | POA: Diagnosis not present

## 2022-06-25 DIAGNOSIS — E782 Mixed hyperlipidemia: Secondary | ICD-10-CM | POA: Diagnosis not present

## 2022-06-25 MED ORDER — DILTIAZEM HCL ER COATED BEADS 180 MG PO CP24: 180.0000 mg | ORAL_CAPSULE | Freq: Every day | ORAL | 3 refills | Status: DC

## 2022-06-25 NOTE — Patient Instructions (Signed)
Medication Instructions:  Your physician recommends that you continue on your current medications as directed. Please refer to the Current Medication list given to you today.  *If you need a refill on your cardiac medications before your next appointment, please call your pharmacy*   Lab Work: NONE If you have labs (blood work) drawn today and your tests are completely normal, you will receive your results only by: MyChart Message (if you have MyChart) OR A paper copy in the mail If you have any lab test that is abnormal or we need to change your treatment, we will call you to review the results.   Testing/Procedures: NONE   Follow-Up: At [facility], you and your health needs are our priority.  As part of our continuing mission to provide you with exceptional heart care, we have created designated Provider Care Teams.  These Care Teams include your primary Cardiologist (physician) and Advanced Practice Providers (APPs -  Physician Assistants and Nurse Practitioners) who all work together to provide you with the care you need, when you need it.  We recommend signing up for the patient portal called "MyChart".  Sign up information is provided on this After Visit Summary.  MyChart is used to connect with patients for Virtual Visits (Telemedicine).  Patients are able to view lab/test results, encounter notes, upcoming appointments, etc.  Non-urgent messages can be sent to your provider as well.   To learn more about what you can do with MyChart, go to [email or web address].    Your next appointment:   6 month(s)  The format for your next appointment:   In Person  Provider:   Kardie Tobb, DO   

## 2022-06-27 NOTE — Progress Notes (Signed)
Cardiology Office Note:    Date:  06/27/2022   ID:  [name], [name] 1951-03-24, MRN [id number]  PCP:  Mary Andrea, MD  Cardiologist:  Mary Salines, DO  Electrophysiologist:  None   Referring MD: Mary Andrea, MD   " I am doing fine"   History of Present Illness:    Mary Wise is a 71 y.o. female with a hx of hypertension, hyperlipidemia, history of breast cancer status postlumpectomy chemo and radiation, anxiety here today for follow-up visit.  I first saw the patient on April 10, 2022 at that time she was experiencing significant fluttering as well as intermittent palpitations.  I placed a monitor on the patient.  She is here today to discuss those results.  She is here today with her daughter Mary Wise.  She tells me she still is experiencing the palpitations.  She is concerned about that because she is starting to get lightheaded.  Past Medical History:  Diagnosis Date   Anxiety    Asthma    Cancer ([place])    left breast cancer   Family history of cancer    Genetic testing 02/01/2018   Multi-Cancer panel (83 genes) @ [facility] - No pathogenic mutations detected   GERD (gastroesophageal reflux disease)    Hemorrhoids    History of radiation therapy 05/27/18- 06/23/18   Left Breast 40.05 Gy in 15 fractions, left breast boost 10 Gy in 5 fractions.    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    Past Surgical History:  Procedure Laterality Date   ABDOMINAL HYSTERECTOMY     APPENDECTOMY     BREAST LUMPECTOMY Left 2018   CATARACT EXTRACTION Bilateral    CESAREAN SECTION     CHOLECYSTECTOMY     COLONOSCOPY WITH PROPOFOL N/A 01/15/2021   Procedure: COLONOSCOPY WITH PROPOFOL;  Surgeon: Harvel Quale, MD;  [facility];  Service: Gastroenterology;  Laterality: N/A;  10:00   ERCP     ESOPHAGEAL DILATION N/A 04/10/2021   Procedure: ESOPHAGEAL DILATION;  Surgeon:  Rogene Houston, MD;  [facility];  Service: Endoscopy;  Laterality: N/A;   ESOPHAGOGASTRODUODENOSCOPY (EGD) WITH PROPOFOL N/A 04/10/2021   Procedure: ESOPHAGOGASTRODUODENOSCOPY (EGD) WITH PROPOFOL;  Surgeon: Rogene Houston, MD;  [facility];  Service: Endoscopy;  Laterality: N/A;  10:55   IR FLUORO GUIDE PORT INSERTION RIGHT  10/05/2017   IR REMOVAL TUN ACCESS W/ PORT W/O FL MOD SED  05/13/2018   IR US GUIDE VASC ACCESS RIGHT  10/05/2017   LIPOMA EXCISION Left 03/16/2018   left shoulder   LIVER SURGERY     MASS EXCISION N/A 03/30/2020   Procedure: EXCISION OF SEBACEOUS CYST ON LEFT NECK, SEBACEOUSE CYST ON BACK AND ATYPICAL MOLE ON RIGHT NECK, AND LEFT AXILLA SKIN TAG;  Surgeon: Virl Cagey, MD;  Location: [facility];  Service: General;  Laterality: N/A;   PARTIAL MASTECTOMY WITH AXILLARY SENTINEL LYMPH NODE BIOPSY Left 07/31/2017   Procedure: LEFT PARTIAL MASTECTOMY WITH AXILLARY SENTINEL LYMPH NODE BIOPSY;  Surgeon: Virl Cagey, MD;  Location: [facility];  Service: General;  Laterality: Left;   POLYPECTOMY  01/15/2021   Procedure: POLYPECTOMY;  Surgeon: Harvel Quale, MD;  [facility];  Service: Gastroenterology;;   TOTAL KNEE ARTHROPLASTY Left 07/05/2019   Procedure: TOTAL KNEE ARTHROPLASTY;  Surgeon: Renette Butters,  MD;  Location: [facility];  Service: Orthopedics;  Laterality: Left;    Current Medications: Current Meds  Medication Sig   albuterol (PROVENTIL) (2.5 MG/3ML) 0.083% nebulizer solution Take 3 mLs (2.5 mg total) by nebulization every 6 (six) hours as needed for wheezing or shortness of breath.   albuterol (VENTOLIN HFA) 108 (90 Base) MCG/ACT inhaler INHALE 2 PUFFS BY MOUTH EVERY 6 HOURS AS NEEDED FOR WHEEZING FOR SHORTNESS OF BREATH (Patient taking differently: Inhale 2 puffs into the lungs every 6 (six) hours as needed for shortness of breath or wheezing.)   ALPRAZolam (XANAX) 1 MG tablet Take 1 tablet (1 mg total) by  mouth 2 (two) times daily as needed. for anxiety (Patient taking differently: Take 1 mg by mouth 2 (two) times daily.)   diltiazem (CARDIZEM CD) 180 MG 24 hr capsule Take 1 capsule (180 mg total) by mouth daily.   ibuprofen (ADVIL) 800 MG tablet Take 1 tablet (800 mg total) by mouth every 8 (eight) hours as needed. (Patient taking differently: Take 400-800 mg by mouth every 8 (eight) hours as needed for moderate pain.)   losartan (COZAAR) 100 MG tablet Take 100 mg (1 tablet) once daily.   omeprazole (PRILOSEC) 20 MG capsule Take 1 capsule (20 mg total) by mouth 2 (two) times daily before a meal.   ondansetron (ZOFRAN) 4 MG tablet Take 4 mg by mouth every 8 (eight) hours as needed for nausea or vomiting.   Ophthalmic Irrigation Solution (EYE [place] OP) Place 1 application into both eyes 3 (three) times daily as needed (eye irritation).   SUMAtriptan (IMITREX) 100 MG tablet May repeat in 2 hours if headache persists or recurs. (Patient taking differently: Take 100 mg by mouth every 2 (two) hours as needed for migraine (May repeat in 2 hours if headache persists or recurs.).)   tiZANidine (ZANAFLEX) 4 MG tablet Take 1 tablet (4 mg total) by mouth every 8 (eight) hours as needed for muscle spasms.   zolpidem (AMBIEN) 10 MG tablet Take 1 tablet (10 mg total) by mouth at bedtime as needed. for sleep     Allergies:   Onion, Penicillins, Sulfa antibiotics, Yellow dye, Tape, Clindamycin/lincomycin, Latex, and Lipitor [atorvastatin calcium]   Social History   Socioeconomic History   Marital status: Single    Spouse name: Not on file   Number of children: Not on file   Years of education: Not on file   Highest education level: Not on file  Occupational History   Not on file  Tobacco Use   Smoking status: Never   Smokeless tobacco: Never  Vaping Use   Vaping Use: Never used  Substance and Sexual Activity   Alcohol use: No   Drug use: Never   Sexual activity: Never  Other Topics Concern   Not on  file  Social History Narrative   Lives in [place] [address].    Social Determinants of Health   Financial Resource Strain: Not on file  Food Insecurity: Not on file  Transportation Needs: No Transportation Needs (07/23/2018)   PRAPARE - Hydrologist (Medical): No    Lack of Transportation (Non-Medical): No  Physical Activity: Not on file  Stress: Not on file  Social Connections: Not on file     Family History: The patient's family history includes Breast cancer in her maternal grandmother; Cancer in her paternal aunt, paternal grandfather, paternal grandmother, paternal uncle, paternal uncle, paternal uncle, and another family member; Cancer (age  of onset: 55) in her mother; Cancer (age of onset: 3) in her father; Diabetes in her brother, sister, sister, and sister; Lung cancer in her maternal aunt; Melanoma in an other family member; Ovarian cancer (age of onset: 33) in her sister; Stomach cancer in her cousin; Stomach cancer (age of onset: 70) in her maternal aunt.  ROS:   Review of Systems  Constitution: Negative for decreased appetite, fever and weight gain.  HENT: Negative for congestion, ear discharge, hoarse voice and sore throat.   Eyes: Negative for discharge, redness, vision loss in right eye and visual halos.  Cardiovascular: Negative for chest pain, dyspnea on exertion, leg swelling, orthopnea and palpitations.  Respiratory: Negative for cough, hemoptysis, shortness of breath and snoring.   Endocrine: Negative for heat intolerance and polyphagia.  Hematologic/Lymphatic: Negative for bleeding problem. Does not bruise/bleed easily.  Skin: Negative for flushing, nail changes, rash and suspicious lesions.  Musculoskeletal: Negative for arthritis, joint pain, muscle cramps, myalgias, neck pain and stiffness.  Gastrointestinal: Negative for abdominal pain, bowel incontinence, diarrhea and excessive appetite.  Genitourinary: Negative for  decreased libido, genital sores and incomplete emptying.  Neurological: Negative for brief paralysis, focal weakness, headaches and loss of balance.  Psychiatric/Behavioral: Negative for altered mental status, depression and suicidal ideas.  Allergic/Immunologic: Negative for HIV exposure and persistent infections.    EKGs/Labs/Other Studies Reviewed:    The following studies were reviewed today:   EKG: None today  ZIO monitor results: Patch Wear Time:  13 days and 0 hours (2023-09-14T19:20:07-399 to 2023-09-27T19:40:54-0400)   Patient had a min HR of 53 bpm, max HR of 255 bpm, and avg HR of 83 bpm. Predominant underlying rhythm was Sinus Rhythm.    3 Ventricular Tachycardia runs occurred, the run with the fastest interval lasting 5 beats with a max rate of 184 bpm, the longest lasting 4 beats with an avg rate of 105 bpm.  55 Supraventricular Tachycardia runs occurred, the run with the fastest interval lasting 16 beats with a max rate of 255 bpm, the longest lasting 16 beats with an avg rate of 97 bpm.    Isolated SVEs were rare (<1.0%), SVE Couplets were rare (<1.0%), and SVE Triplets were rare (<1.0%). Isolated VEs were rare (<1.0%, 2173), VE Couplets were rare (<1.0%, 18), and no VE Triplets were present. Ventricular Trigeminy was present.    No symptoms reported.   Conclusion: This study shows evidence of the following:                         1. Nonsustained ventricular tachycardia                          2. Paroxysmal atrial tachycardia  Transthoracic echocardiogram 04/16/2022 IMPRESSIONS     1. Left ventricular ejection fraction, by estimation, is 65 to 70%. The  left ventricle has normal function. The left ventricle has no regional  wall motion abnormalities. Left ventricular diastolic parameters are  consistent with Grade I diastolic  dysfunction (impaired relaxation).   2. Right ventricular systolic function is normal. The right ventricular  size is normal. Tricuspid  regurgitation signal is inadequate for assessing  PA pressure.   3. A small and somewhat organized pericardial effusion is present. The  pericardial effusion is posterior to the left ventricle.   4. The mitral valve is grossly normal. Trivial mitral valve  regurgitation.   5. The aortic valve is tricuspid. Aortic valve  regurgitation is not  visualized.   6. The inferior vena cava is normal in size with greater than 50%  respiratory variability, suggesting right atrial pressure of 3 mmHg.   Comparison(s): Prior images unable to be directly viewed.   FINDINGS   Left Ventricle: Left ventricular ejection fraction, by estimation, is 65  to 70%. The left ventricle has normal function. The left ventricle has no  regional wall motion abnormalities. The left ventricular internal cavity  size was normal in size. There is   borderline left ventricular hypertrophy. Left ventricular diastolic  parameters are consistent with Grade I diastolic dysfunction (impaired  relaxation).   Right Ventricle: The right ventricular size is normal. No increase in  right ventricular wall thickness. Right ventricular systolic function is  normal. Tricuspid regurgitation signal is inadequate for assessing PA  pressure.   Left Atrium: Left atrial size was normal in size.   Right Atrium: Right atrial size was normal in size.   Pericardium: A small pericardial effusion is present. The pericardial  effusion is posterior to the left ventricle. Presence of epicardial fat  layer.   Mitral Valve: The mitral valve is grossly normal. Trivial mitral valve  regurgitation.   Tricuspid Valve: The tricuspid valve is grossly normal. Tricuspid valve  regurgitation is trivial.   Aortic Valve: The aortic valve is tricuspid. Aortic valve regurgitation is  not visualized.   Pulmonic Valve: The pulmonic valve was grossly normal. Pulmonic valve  regurgitation is trivial.   Aorta: The aortic root is normal in size and  structure.   Venous: The inferior vena cava is normal in size with greater than 50%  respiratory variability, suggesting right atrial pressure of 3 mmHg.   IAS/Shunts: No atrial level shunt detected by color flow Doppler   Recent Labs: 01/03/2022: Magnesium 1.8 01/29/2022: ALT 21; BUN 10; Creatinine 0.80; Hemoglobin 13.2; Platelet Count 301; Potassium 3.2; Sodium 142  Recent Lipid Panel    Component Value Date/Time   CHOL 229 (H) 09/10/2020 1428   TRIG 115 09/10/2020 1428   HDL 59 09/10/2020 1428   CHOLHDL 3.9 09/10/2020 1428   LDLCALC 147 (H) 09/10/2020 1428    Physical Exam:    VS:  BP (!) 144/92   Pulse 100   Ht '5\' 8"'$  (1.727 m)   Wt 198 lb 12.8 oz (90.2 kg)   SpO2 98%   BMI 30.23 kg/m     Wt Readings from Last 3 Encounters:  06/25/22 198 lb 12.8 oz (90.2 kg)  04/10/22 203 lb (92.1 kg)  01/29/22 202 lb 6.4 oz (91.8 kg)     GEN: Well nourished, well developed in no acute distress HEENT: Normal NECK: No JVD; No carotid bruits LYMPHATICS: No lymphadenopathy CARDIAC: S1S2 noted,RRR, no murmurs, rubs, gallops RESPIRATORY:  Clear to auscultation without rales, wheezing or rhonchi  ABDOMEN: Soft, non-tender, non-distended, +bowel sounds, no guarding. EXTREMITIES: No edema, No cyanosis, no clubbing MUSCULOSKELETAL:  No deformity  SKIN: Warm and dry NEUROLOGIC:  Alert and oriented x 3, non-focal PSYCHIATRIC:  Normal affect, good insight  ASSESSMENT:    1. PSVT (paroxysmal supraventricular tachycardia)   2. NSVT (nonsustained ventricular tachycardia) ([place])   3. Mixed hyperlipidemia   4. Primary hypertension    PLAN:    I discussed the monitor results with the patient and her daughter who was present in the room.  She is having significant paroxysmal SVT as well as some run of nonsustained ventricular tachycardia.  The best thing to do is to start AV  nodal blockers on the patient.  Diltiazem 180 mg will be started.  I am hoping this will help with her symptoms as  well.  In terms of her hypertension she is on losartan 100 mg daily, with addition of Cardizem that she will also help as she was not at target today.    The patient is in agreement with the above plan. The patient left the office in stable condition.  The patient will follow up in 6 months or sooner if needed.   Medication Adjustments/Labs and Tests Ordered: Current medicines are reviewed at length with the patient today.  Concerns regarding medicines are outlined above.  No orders of the defined types were placed in this encounter.  Meds ordered this encounter  Medications   diltiazem (CARDIZEM CD) 180 MG 24 hr capsule    Sig: Take 1 capsule (180 mg total) by mouth daily.    Dispense:  90 capsule    Refill:  3    Patient Instructions  Medication Instructions:  Your physician recommends that you continue on your current medications as directed. Please refer to the Current Medication list given to you today.  *If you need a refill on your cardiac medications before your next appointment, please call your pharmacy*   Lab Work: NONE If you have labs (blood work) drawn today and your tests are completely normal, you will receive your results only by: [place] (if you have MyChart) OR A paper copy in the mail If you have any lab test that is abnormal or we need to change your treatment, we will call you to review the results.   Testing/Procedures: NONE   Follow-Up: At [facility], you and your health needs are our priority.  As part of our continuing mission to provide you with exceptional heart care, we have created designated Provider Care Teams.  These Care Teams include your primary Cardiologist (physician) and Advanced Practice Providers (APPs -  Physician Assistants and Nurse Practitioners) who all work together to provide you with the care you need, when you need it.  We recommend signing up for the patient portal called "MyChart".  Sign up information is  provided on this After Visit Summary.  MyChart is used to connect with patients for Virtual Visits (Telemedicine).  Patients are able to view lab/test results, encounter notes, upcoming appointments, etc.  Non-urgent messages can be sent to your provider as well.   To learn more about what you can do with MyChart, go to [email or web address].    Your next appointment:   6 month(s)  The format for your next appointment:   In Person  Provider:   Berniece Salines, DO     Adopting a Healthy Lifestyle.  Know what a healthy weight is for you (roughly BMI <25) and aim to maintain this   Aim for 7+ servings of fruits and vegetables daily   65-80+ fluid ounces of water or unsweet tea for healthy kidneys   Limit to max 1 drink of alcohol per day; avoid smoking/tobacco   Limit animal fats in diet for cholesterol and heart health - choose grass fed whenever available   Avoid highly processed foods, and foods high in saturated/trans fats   Aim for low stress - take time to unwind and care for your mental health   Aim for 150 min of moderate intensity exercise weekly for heart health, and weights twice weekly for bone health   Aim for 7-9 hours of sleep daily   When  it comes to diets, agreement about the perfect plan isnt easy to find, even among the experts. Experts at the [place] developed an idea known as the Healthy Eating Plate. Just imagine a plate divided into logical, healthy portions.   The emphasis is on diet quality:   Load up on vegetables and fruits - one-half of your plate: Aim for color and variety, and remember that potatoes dont count.   Go for whole grains - one-quarter of your plate: Whole wheat, barley, wheat berries, quinoa, oats, brown rice, and foods made with them. If you want pasta, go with whole wheat pasta.   Protein power - one-quarter of your plate: Fish, chicken, beans, and nuts are all healthy, versatile protein sources. Limit red  meat.   The diet, however, does go beyond the plate, offering a few other suggestions.   Use healthy plant oils, such as olive, canola, soy, corn, sunflower and peanut. Check the labels, and avoid partially hydrogenated oil, which have unhealthy trans fats.   If youre thirsty, drink water. Coffee and tea are good in moderation, but skip sugary drinks and limit milk and dairy products to one or two daily servings.   The type of carbohydrate in the diet is more important than the amount. Some sources of carbohydrates, such as vegetables, fruits, whole grains, and beans-are healthier than others.   Finally, stay active  Signed, Mary Salines, DO  06/27/2022 11:31 AM    [place]

## 2022-07-10 NOTE — Progress Notes (Deleted)
Office Visit Note  Patient: Mary Wise             Date of Birth: 1950/10/09           MRN: [id number]             PCP: Leslie Andrea, MD Referring: Leslie Andrea, MD Visit Date: 07/23/2022 Occupation: '@GUAROCC'$ @  Subjective:  No chief complaint on file.   History of Present Illness: Mary Wise is a 71 y.o. female ***   Activities of Daily Living:  Patient reports morning stiffness for *** {minute/hour:19697}.   Patient {ACTIONS;DENIES/REPORTS:21021675::"Denies"} nocturnal pain.  Difficulty dressing/grooming: {ACTIONS;DENIES/REPORTS:21021675::"Denies"} Difficulty climbing stairs: {ACTIONS;DENIES/REPORTS:21021675::"Denies"} Difficulty getting out of chair: {ACTIONS;DENIES/REPORTS:21021675::"Denies"} Difficulty using hands for taps, buttons, cutlery, and/or writing: {ACTIONS;DENIES/REPORTS:21021675::"Denies"}  No Rheumatology ROS completed.   PMFS History:  Patient Active Problem List   Diagnosis Date Noted   GERD (gastroesophageal reflux disease) 03/26/2021   Abdominal pain 03/26/2021   Esophageal dysphagia 03/26/2021   Wound dehiscence 04/12/2020   Atypical mole of neck 03/06/2020   Sebaceous cyst 02/16/2020   Skin tag 02/16/2020   Primary osteoarthritis of left knee 06/13/2019   OA (osteoarthritis) of knee 05/18/2019   Asthma 05/18/2019   Hyperlipemia 05/18/2019   Osteopenia 02/02/2019   Muscle cramps 01/10/2019   Peripheral neuropathy 10/11/2018   Migraines 02/01/2018   Insomnia 02/01/2018   Genetic testing 02/01/2018   Family history of cancer    Port-A-Cath in place 10/12/2017   Malignant neoplasm of upper-outer quadrant of left breast in female, estrogen receptor negative ([place])    HTN, goal below 140/90 07/01/2017   Anxiety 07/01/2017   Lipoma of left shoulder 06/19/2017   Left breast mass 06/19/2017    Past Medical History:  Diagnosis Date   Anxiety    Asthma    Cancer ([place])    left breast cancer   Family history of cancer    Genetic  testing 02/01/2018   Multi-Cancer panel (83 genes) @ [facility] - No pathogenic mutations detected   GERD (gastroesophageal reflux disease)    Hemorrhoids    History of radiation therapy 05/27/18- 06/23/18   Left Breast 40.05 Gy in 15 fractions, left breast boost 10 Gy in 5 fractions.    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 primary; deceased 37   Cancer Father 31       brain cancer; deceased 25   Ovarian cancer Sister 20       deceased 8   Diabetes Brother    Breast cancer Maternal Grandmother        dx 60s; deceased 31   Cancer Other        "female ca"   Diabetes Sister    Diabetes Sister    Diabetes Sister    Cancer Paternal Uncle        brain cancer    Cancer Paternal Grandmother        "female ca"   Cancer Paternal Grandfather        lung cancer   Cancer Paternal Uncle        brain tumor    Cancer Paternal Uncle        brain tumor    Stomach cancer Maternal Aunt 87  deceased 51   Cancer Paternal Aunt        ink. primary in 2 aunts   Melanoma Other    Lung cancer Maternal Aunt    Stomach cancer Cousin        2 sons of an unaffected maternal uncle   Past Surgical History:  Procedure Laterality Date   ABDOMINAL HYSTERECTOMY     APPENDECTOMY     BREAST LUMPECTOMY Left 2018   CATARACT EXTRACTION Bilateral    CESAREAN SECTION     CHOLECYSTECTOMY     COLONOSCOPY WITH PROPOFOL N/A 01/15/2021   Procedure: COLONOSCOPY WITH PROPOFOL;  Surgeon: Harvel Quale, MD;  [facility];  Service: Gastroenterology;  Laterality: N/A;  10:00   ERCP     ESOPHAGEAL DILATION N/A 04/10/2021   Procedure: ESOPHAGEAL DILATION;  Surgeon: Rogene Houston, MD;  [facility];  Service: Endoscopy;  Laterality: N/A;   ESOPHAGOGASTRODUODENOSCOPY (EGD) WITH PROPOFOL N/A 04/10/2021   Procedure:  ESOPHAGOGASTRODUODENOSCOPY (EGD) WITH PROPOFOL;  Surgeon: Rogene Houston, MD;  [facility];  Service: Endoscopy;  Laterality: N/A;  10:55   IR FLUORO GUIDE PORT INSERTION RIGHT  10/05/2017   IR REMOVAL TUN ACCESS W/ PORT W/O FL MOD SED  05/13/2018   IR US GUIDE VASC ACCESS RIGHT  10/05/2017   LIPOMA EXCISION Left 03/16/2018   left shoulder   LIVER SURGERY     MASS EXCISION N/A 03/30/2020   Procedure: EXCISION OF SEBACEOUS CYST ON LEFT NECK, SEBACEOUSE CYST ON BACK AND ATYPICAL MOLE ON RIGHT NECK, AND LEFT AXILLA SKIN TAG;  Surgeon: Virl Cagey, MD;  Location: [facility];  Service: General;  Laterality: N/A;   PARTIAL MASTECTOMY WITH AXILLARY SENTINEL LYMPH NODE BIOPSY Left 07/31/2017   Procedure: LEFT PARTIAL MASTECTOMY WITH AXILLARY SENTINEL LYMPH NODE BIOPSY;  Surgeon: Virl Cagey, MD;  Location: [facility];  Service: General;  Laterality: Left;   POLYPECTOMY  01/15/2021   Procedure: POLYPECTOMY;  Surgeon: Harvel Quale, MD;  [facility];  Service: Gastroenterology;;   TOTAL KNEE ARTHROPLASTY Left 07/05/2019   Procedure: TOTAL KNEE ARTHROPLASTY;  Surgeon: Renette Butters, MD;  Location: [facility];  Service: Orthopedics;  Laterality: Left;   Social History   Social History Narrative   Lives in [facility]isted Living on [address].    Immunization History  Administered Date(s) Administered   Moderna Sars-Covid-2 Vaccination 12/07/2019, 01/04/2020   Pneumococcal Conjugate-13 05/29/2020   Tdap 03/21/1998, 11/02/2013     Objective: Vital Signs: There were no vitals taken for this visit.   Physical Exam   Musculoskeletal Exam: ***  CDAI Exam: CDAI Score: -- Patient Global: --; Provider Global: -- Swollen: --; Tender: -- Joint Exam 07/23/2022   No joint exam has been documented for this visit   There is currently no information documented on the homunculus. Go to the Rheumatology activity and complete the homunculus joint  exam.  Investigation: No additional findings.  Imaging: No results found.  Recent Labs: Lab Results  Component Value Date   WBC 7.0 01/29/2022   HGB 13.2 01/29/2022   PLT 301 01/29/2022   NA 142 01/29/2022   K 3.2 (L) 01/29/2022   CL 106 01/29/2022   CO2 28 01/29/2022   GLUCOSE 87 01/29/2022   BUN 10 01/29/2022   CREATININE 0.80 01/29/2022   BILITOT 0.5 01/29/2022   ALKPHOS 107 01/29/2022   AST 24 01/29/2022   ALT 21 01/29/2022   PROT 8.0 01/29/2022  ALBUMIN 4.2 01/29/2022   CALCIUM 9.3 01/29/2022   GFRAA 97 09/10/2020    Speciality Comments: No specialty comments available.  Procedures:  No procedures performed Allergies: Onion, Penicillins, Sulfa antibiotics, Yellow dye, Tape, Clindamycin/lincomycin, Latex, and Lipitor [atorvastatin calcium]   Assessment / Plan:     Visit Diagnoses: Bilateral hand pain  Primary osteoarthritis of left knee  Bilateral carpal tunnel syndrome  Drug-induced polyneuropathy (HCC)  HTN, goal below 140/90  Chronic migraine without aura without status migrainosus, not intractable  Mild intermittent asthma without complication  History of gastroesophageal reflux (GERD)  Esophageal dysphagia  Osteopenia of multiple sites  Mixed hyperlipidemia  Malignant neoplasm of upper-outer quadrant of left breast in female, estrogen receptor negative ([place])  Port-A-Cath in place  History of anxiety  Orders: No orders of the defined types were placed in this encounter.  No orders of the defined types were placed in this encounter.   Face-to-face time spent with patient was *** minutes. Greater than 50% of time was spent in counseling and coordination of care.  Follow-Up Instructions: No follow-ups on file.   Ofilia Neas, PA-C  Note - This record has been created using Dragon software.  Chart creation errors have been sought, but may not always  have been located. Such creation errors do not reflect on  the standard of  medical care.

## 2022-07-23 ENCOUNTER — Telehealth: Payer: Self-pay | Admitting: Hematology

## 2022-07-23 ENCOUNTER — Ambulatory Visit: Payer: Medicare Other | Admitting: Rheumatology

## 2022-07-23 DIAGNOSIS — Z95828 Presence of other vascular implants and grafts: Secondary | ICD-10-CM

## 2022-07-23 DIAGNOSIS — R1319 Other dysphagia: Secondary | ICD-10-CM

## 2022-07-23 DIAGNOSIS — Z171 Estrogen receptor negative status [ER-]: Secondary | ICD-10-CM

## 2022-07-23 DIAGNOSIS — J4520 Mild intermittent asthma, uncomplicated: Secondary | ICD-10-CM

## 2022-07-23 DIAGNOSIS — G620 Drug-induced polyneuropathy: Secondary | ICD-10-CM

## 2022-07-23 DIAGNOSIS — M1712 Unilateral primary osteoarthritis, left knee: Secondary | ICD-10-CM

## 2022-07-23 DIAGNOSIS — G5603 Carpal tunnel syndrome, bilateral upper limbs: Secondary | ICD-10-CM

## 2022-07-23 DIAGNOSIS — I10 Essential (primary) hypertension: Secondary | ICD-10-CM

## 2022-07-23 DIAGNOSIS — E782 Mixed hyperlipidemia: Secondary | ICD-10-CM

## 2022-07-23 DIAGNOSIS — M8589 Other specified disorders of bone density and structure, multiple sites: Secondary | ICD-10-CM

## 2022-07-23 DIAGNOSIS — Z8719 Personal history of other diseases of the digestive system: Secondary | ICD-10-CM

## 2022-07-23 DIAGNOSIS — M79641 Pain in right hand: Secondary | ICD-10-CM

## 2022-07-23 DIAGNOSIS — G43709 Chronic migraine without aura, not intractable, without status migrainosus: Secondary | ICD-10-CM

## 2022-07-23 DIAGNOSIS — Z8659 Personal history of other mental and behavioral disorders: Secondary | ICD-10-CM

## 2022-07-23 NOTE — Telephone Encounter (Signed)
Patient aware of appointment change  

## 2022-08-01 ENCOUNTER — Inpatient Hospital Stay: Payer: Medicare Other

## 2022-08-01 ENCOUNTER — Inpatient Hospital Stay: Payer: Medicare Other | Admitting: Oncology

## 2022-08-01 ENCOUNTER — Ambulatory Visit: Payer: Medicare Other | Admitting: Hematology

## 2022-08-01 ENCOUNTER — Telehealth: Payer: Self-pay | Admitting: Hematology

## 2022-08-01 ENCOUNTER — Other Ambulatory Visit: Payer: Medicare Other

## 2022-08-01 NOTE — Telephone Encounter (Signed)
Patient called to r/s 12/29 appointment due to feeling ill. Patient wanted r/s for later date. Patient notified of new appointment date/time.

## 2022-08-20 ENCOUNTER — Ambulatory Visit: Payer: Medicare Other | Admitting: Cardiology

## 2022-08-21 ENCOUNTER — Ambulatory Visit: Payer: Medicare Other | Admitting: Rheumatology

## 2022-08-24 NOTE — Assessment & Plan Note (Signed)
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  Proliferation marker Ki67 at 90% -diagnosed in 06/2017. S/p left lumpectomy, Adjuvant chemo AC-T and adjuvant radiation.  -most recent mammogram 12/20/21 was benign. -From a breast cancer standpoint, she is clinically doing well. Lab reviewed, her CBC and CMP are within normal limits. Physical exam was unremarkable. There is no clinical concern for breast cancer recurrence.

## 2022-08-25 ENCOUNTER — Encounter: Payer: Self-pay | Admitting: Hematology

## 2022-08-25 ENCOUNTER — Inpatient Hospital Stay (HOSPITAL_BASED_OUTPATIENT_CLINIC_OR_DEPARTMENT_OTHER): Payer: Medicare Other | Admitting: Hematology

## 2022-08-25 ENCOUNTER — Inpatient Hospital Stay: Payer: Medicare Other | Attending: Hematology

## 2022-08-25 ENCOUNTER — Other Ambulatory Visit: Payer: Self-pay

## 2022-08-25 VITALS — BP 152/79 | HR 71 | Temp 97.8°F | Resp 18 | Ht 68.0 in | Wt 203.7 lb

## 2022-08-25 DIAGNOSIS — Z923 Personal history of irradiation: Secondary | ICD-10-CM | POA: Insufficient documentation

## 2022-08-25 DIAGNOSIS — Z853 Personal history of malignant neoplasm of breast: Secondary | ICD-10-CM | POA: Diagnosis present

## 2022-08-25 DIAGNOSIS — Z1231 Encounter for screening mammogram for malignant neoplasm of breast: Secondary | ICD-10-CM

## 2022-08-25 DIAGNOSIS — Z9221 Personal history of antineoplastic chemotherapy: Secondary | ICD-10-CM | POA: Insufficient documentation

## 2022-08-25 DIAGNOSIS — Z171 Estrogen receptor negative status [ER-]: Secondary | ICD-10-CM | POA: Diagnosis not present

## 2022-08-25 DIAGNOSIS — R519 Headache, unspecified: Secondary | ICD-10-CM | POA: Insufficient documentation

## 2022-08-25 DIAGNOSIS — Z79899 Other long term (current) drug therapy: Secondary | ICD-10-CM | POA: Diagnosis not present

## 2022-08-25 DIAGNOSIS — C50412 Malignant neoplasm of upper-outer quadrant of left female breast: Secondary | ICD-10-CM

## 2022-08-25 LAB — CBC WITH DIFFERENTIAL (CANCER CENTER ONLY)
Abs Immature Granulocytes: 0.01 10*3/uL (ref 0.00–0.07)
Basophils Absolute: 0.1 10*3/uL (ref 0.0–0.1)
Basophils Relative: 1 %
Eosinophils Absolute: 0.3 10*3/uL (ref 0.0–0.5)
Eosinophils Relative: 5 %
HCT: 40.3 % (ref 36.0–46.0)
Hemoglobin: 13.5 g/dL (ref 12.0–15.0)
Immature Granulocytes: 0 %
Lymphocytes Relative: 35 %
Lymphs Abs: 2 10*3/uL (ref 0.7–4.0)
MCH: 29.5 pg (ref 26.0–34.0)
MCHC: 33.5 g/dL (ref 30.0–36.0)
MCV: 88 fL (ref 80.0–100.0)
Monocytes Absolute: 0.4 10*3/uL (ref 0.1–1.0)
Monocytes Relative: 7 %
Neutro Abs: 2.9 10*3/uL (ref 1.7–7.7)
Neutrophils Relative %: 52 %
Platelet Count: 298 10*3/uL (ref 150–400)
RBC: 4.58 MIL/uL (ref 3.87–5.11)
RDW: 13.2 % (ref 11.5–15.5)
WBC Count: 5.6 10*3/uL (ref 4.0–10.5)
nRBC: 0 % (ref 0.0–0.2)

## 2022-08-25 LAB — CMP (CANCER CENTER ONLY)
ALT: 20 U/L (ref 0–44)
AST: 21 U/L (ref 15–41)
Albumin: 4 g/dL (ref 3.5–5.0)
Alkaline Phosphatase: 92 U/L (ref 38–126)
Anion gap: 5 (ref 5–15)
BUN: 12 mg/dL (ref 8–23)
CO2: 30 mmol/L (ref 22–32)
Calcium: 9.3 mg/dL (ref 8.9–10.3)
Chloride: 104 mmol/L (ref 98–111)
Creatinine: 0.83 mg/dL (ref 0.44–1.00)
GFR, Estimated: >60 mL/min (ref >60)
Glucose, Bld: 83 mg/dL (ref 70–99)
Potassium: 3.8 mmol/L (ref 3.5–5.1)
Sodium: 139 mmol/L (ref 135–145)
Total Bilirubin: 0.4 mg/dL (ref 0.3–1.2)
Total Protein: 7.1 g/dL (ref 6.5–8.1)

## 2022-08-25 NOTE — Progress Notes (Signed)
[place]   [phone] [phone]   Clinic Follow up Note   Patient Care Team: Leslie Andrea, MD as PCP - General (Family Medicine) Berniece Salines, DO as PCP - Cardiology (Cardiology) Truitt Merle, MD as Consulting Physician (Hematology) Eppie Gibson, MD as Attending Physician (Radiation Oncology) Alla Feeling, NP as Nurse Practitioner (Nurse Practitioner) Virl Cagey, MD as Consulting Physician (General Surgery) Daneil Dolin, MD as Consulting Physician (Gastroenterology)  Date of Service:  08/25/2022  CHIEF COMPLAINT: f/u of  left breast cancer    CURRENT THERAPY: Surveillance    ASSESSMENT:  Mary FUERSTENBERG is a 72 y.o. 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  Proliferation marker Ki67 at 90% -diagnosed in 06/2017. S/p left lumpectomy, Adjuvant chemo AC-T and adjuvant radiation.  -most recent mammogram 12/20/21 was benign. -From a breast cancer standpoint, she is clinically doing well. Lab reviewed, her CBC and CMP are within normal limits. Physical exam was unremarkable. There is no clinical concern for breast cancer recurrence.  Headaches -She has had headaches for several months, stable but bothersome. - I previously ordered a brain MRI but she is not able to do due to close for me. -No other neurological symptoms -I will refer her to see Dr. Mickeal Skinner for further evaluation.  PLAN: -referral Dr.Vaslow for Headaches  - Order Mammogram 12/2022 -referral GI Dr. Lorenso Courier -f/u in 1 year   SUMMARY OF ONCOLOGIC HISTORY: Oncology History Overview Note  Cancer Staging Malignant neoplasm of upper-outer quadrant of left breast in female, estrogen receptor negative ([place]) Staging form: Breast, AJCC 8th Edition - Pathologic stage from 08/19/2017:  Stage IIA (pT2, pN0(sn), cM0, G3, ER: Negative, PR: Negative, HER2: Negative) - Signed by Ardath Sax, MD on 09/01/2017     Malignant neoplasm of upper-outer quadrant of left breast in female, estrogen receptor negative ([place])  06/16/2017 Initial Diagnosis   Malignant neoplasm of upper-outer quadrant of left breast in female: Initially presented with left deltoid mass which was consistent with lipoma.  During examination, a mass in the left breast was appreciated by palpation.   06/16/2017 Mammogram   BL Diagnostic Study: Irregular hyperdense mass in the upper outer quadrant of the left breast measuring 3.6 cm without radiographically detectable lymphadenopathy.  Normal appearance of the right breast.   06/16/2017 Breast US   3.2 cm irregular mass located at 2:30 o'clock, no pathological lymphadenopathy.   06/23/2017 Pathology Results   Ultrasound-guided biopsy: Positive for invasive ductal carcinoma, grade 3, ER 0%, PR 0%, HER-2 negative by [facility]   07/31/2017 Surgery   Partial left mastectomy with sentinel lymph node biopsy --Pathology: Invasive ductal carcinoma, measuring 4.5 cm, grade 3.  All margins negative, but the anterior margin less than 0.1 cm.  No surrounding DCIS.  0/5 lymph nodes positive for malignancy   10/08/2017 PET scan   IMPRESSION: 1. No evidence breast cancer metastasis on FDG PET-CT scan. 2. Postsurgical change in the LEFT lateral breast and LEFT axilla.   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 Significance The following genes were evaluated for sequence changes and exonic deletions/duplications: ALK,  APC, ATM, AXIN2, BAP1, BARD1, BLM, BMPR1A, BRCA1, BRCA2, BRIP1, CASR, CDC73, CDH1, CDK4,  CDKN1B, CDKN1C, CDKN2A (p14ARF), CDKN2A (p16INK4a), CEBPA, CHEK2, CTNNA1, DICER1, DIS3L2, EPCAM*, FH, FLCN, GATA2, GPC3, GREM1*, HRAS, KIT, MAX, MEN1, MET, MLH1, MSH2, MSH3, MSH6, MUTYH, NBN, NF1, NF2, PALB2, PDGFRA, PHOX2B*, PMS2, POLD1, POLE, POT1, PRKAR1A, PTCH1, PTEN, RAD50, RAD51C, RAD51D, RB1, RECQL4, RET, RUNX1, SDHAF2, SDHB, SDHC, SDHD, SMAD4, SMARCA4, SMARCB1, SMARCE1, STK11, SUFU, TERC, TERT, TMEM127, TP53, TSC1, TSC2, VHL, WRN*, WT1 The following genes were evaluated for sequence changes only: EGFR*, HOXB13*, MITF*, NTHL1*, SDHA Results are negative unless otherwise indicated   05/27/2018 - 06/23/2018 Radiation Therapy   Radiation treatment dates:   05/27/2018 - 06/23/2018   Site/dose:    1. Left Breast / 40.05 Gy in 15 fractions 2. Left Breast Boost / 10 Gy in 5 fractions - Total dose of 50.05 Gy   Beams/energy:    1. 3D / 6X Photon 2. Isodose / 10X, 6X Photon   Narrative: The patient tolerated radiation treatment relatively well.  She experienced fatigue and some expected skin irritation with mild erythema and soreness to her breast/axilla. She is applying Radiaplex twice daily as directed. She also mentioned bilateral hand cramping with bruising. She was advised to discuss this with her PCP as it is not related to her radiation treatment.    Survivorship   Per Cira Rue, NP       INTERVAL HISTORY:  Mary Wise is here for a follow up of   left breast cancer  She was last seen by me on 01/29/2022 She presents to the clinic alone. Pt reports she is still having headaches. Pt states her headache is always in  the back of her head.     All other systems were reviewed with the patient and are negative.  MEDICAL HISTORY:  Past Medical History:  Diagnosis Date   Anxiety    Asthma    Cancer ([place])    left breast cancer   Family history of cancer    Genetic testing 02/01/2018   Multi-Cancer panel (83 genes) @ [facility] - No pathogenic mutations detected   GERD  (gastroesophageal reflux disease)    Hemorrhoids    History of radiation therapy 05/27/18- 06/23/18   Left Breast 40.05 Gy in 15 fractions, left breast boost 10 Gy in 5 fractions.    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    SURGICAL HISTORY: Past Surgical History:  Procedure Laterality Date   ABDOMINAL HYSTERECTOMY     APPENDECTOMY     BREAST LUMPECTOMY Left 2018   CATARACT EXTRACTION Bilateral    CESAREAN SECTION     CHOLECYSTECTOMY     COLONOSCOPY WITH PROPOFOL N/A 01/15/2021   Procedure: COLONOSCOPY WITH PROPOFOL;  Surgeon: Harvel Quale, MD;  [facility];  Service: Gastroenterology;  Laterality: N/A;  10:00   ERCP     ESOPHAGEAL DILATION N/A 04/10/2021   Procedure: ESOPHAGEAL DILATION;  Surgeon: Rogene Houston, MD;  [facility];  Service: Endoscopy;  Laterality: N/A;   ESOPHAGOGASTRODUODENOSCOPY (EGD) WITH PROPOFOL N/A 04/10/2021   Procedure: ESOPHAGOGASTRODUODENOSCOPY (EGD) WITH PROPOFOL;  Surgeon: Rogene Houston, MD;  [facility];  Service: Endoscopy;  Laterality: N/A;  10:55   IR FLUORO GUIDE PORT INSERTION RIGHT  10/05/2017   IR REMOVAL TUN ACCESS W/ PORT W/O FL MOD SED  05/13/2018   IR US GUIDE VASC ACCESS RIGHT  10/05/2017   LIPOMA EXCISION Left 03/16/2018   left shoulder   LIVER SURGERY     MASS EXCISION N/A 03/30/2020   Procedure: EXCISION OF SEBACEOUS CYST ON LEFT NECK, SEBACEOUSE CYST ON BACK AND ATYPICAL MOLE ON RIGHT NECK, AND LEFT AXILLA SKIN TAG;  Surgeon: Virl Cagey, MD;  Location: [facility];  Service: General;  Laterality: N/A;   PARTIAL MASTECTOMY WITH AXILLARY SENTINEL LYMPH NODE BIOPSY Left 07/31/2017   Procedure: LEFT PARTIAL MASTECTOMY WITH AXILLARY SENTINEL LYMPH NODE BIOPSY;  Surgeon: Virl Cagey, MD;  Location: [facility];  Service: General;  Laterality: Left;   POLYPECTOMY  01/15/2021   Procedure: POLYPECTOMY;   Surgeon: Harvel Quale, MD;  [facility];  Service: Gastroenterology;;   TOTAL KNEE ARTHROPLASTY Left 07/05/2019   Procedure: TOTAL KNEE ARTHROPLASTY;  Surgeon: Renette Butters, MD;  [facility];  Service: Orthopedics;  Laterality: Left;    I have reviewed the social history and family history with the patient and they are unchanged from previous note.  ALLERGIES:  is allergic to onion, penicillins, sulfa antibiotics, yellow dye, tape, clindamycin/lincomycin, latex, and lipitor [atorvastatin calcium].  MEDICATIONS:  Current Outpatient Medications  Medication Sig Dispense Refill   albuterol (PROVENTIL) (2.5 MG/3ML) 0.083% nebulizer solution Take 3 mLs (2.5 mg total) by nebulization every 6 (six) hours as needed for wheezing or shortness of breath. 90 mL 3   albuterol (VENTOLIN HFA) 108 (90 Base) MCG/ACT inhaler INHALE 2 PUFFS BY MOUTH EVERY 6 HOURS AS NEEDED FOR WHEEZING FOR SHORTNESS OF BREATH (Patient taking differently: Inhale 2 puffs into the lungs every 6 (six) hours as needed for shortness of breath or wheezing.) 18 g 0   ALPRAZolam (XANAX) 1 MG tablet Take 1 tablet (1 mg total) by mouth 2 (two) times daily as needed. for anxiety (Patient taking differently: Take 1 mg by mouth 2 (two) times daily.) 60 tablet 0   diltiazem (CARDIZEM CD) 180 MG 24 hr capsule Take 1 capsule (180 mg total) by mouth daily. 90 capsule 3   ibuprofen (ADVIL) 800 MG tablet Take 1 tablet (800 mg total) by mouth every 8 (eight) hours as needed. (Patient taking differently: Take 400-800 mg by mouth every 8 (eight) hours as needed for moderate pain.) 30 tablet 3   losartan (COZAAR) 100 MG tablet Take 100 mg (1 tablet) once daily. 90 tablet 3   omeprazole (PRILOSEC) 20 MG capsule Take 1 capsule (20 mg total) by mouth 2 (two) times daily before a meal. 90 capsule 5   ondansetron (ZOFRAN) 4 MG tablet Take 4 mg by mouth every 8 (eight) hours as needed for nausea or vomiting.     Ophthalmic  Irrigation Solution (EYE Green OP) Place 1 application into both eyes 3 (three) times daily as needed (eye irritation).     SUMAtriptan (IMITREX) 100 MG tablet May repeat in 2 hours if headache persists or recurs. (Patient taking differently: Take 100 mg by mouth every 2 (two) hours as needed for migraine (May repeat in 2 hours if headache persists or recurs.).) 9 tablet 0   tiZANidine (ZANAFLEX) 4 MG tablet Take 1 tablet (4 mg total) by mouth every 8 (eight) hours as needed for muscle spasms. 60 tablet 0   zolpidem (AMBIEN) 10 MG tablet Take 1 tablet (10 mg total) by mouth at bedtime as needed. for sleep 30 tablet 0   No current facility-administered medications for this visit.  PHYSICAL EXAMINATION: ECOG PERFORMANCE STATUS: 1 - Symptomatic but completely ambulatory  Vitals:   08/25/22 1124  BP: (!) 152/79  Pulse: 71  Resp: 18  Temp: 97.8 F (36.6 C)  SpO2: 100%   Wt Readings from Last 3 Encounters:  08/25/22 203 lb 11.2 oz (92.4 kg)  06/25/22 198 lb 12.8 oz (90.2 kg)  04/10/22 203 lb (92.1 kg)    GENERAL:alert, no distress and comfortable SKIN: skin color, texture, turgor are normal, no rashes or significant lesions EYES: normal, Conjunctiva are pink and non-injected, sclera clear NECK: (-) supple, thyroid normal size, non-tender, without nodularity LYMPH:  (-) no palpable lymphadenopathy in the cervical, axillary  LUNGS: clear to auscultation and percussion with normal breathing effort HEART: regular rate & rhythm and no murmurs and no lower extremity edema ABDOMEN:abdomen soft, non-tender and normal bowel sounds Musculoskeletal:no cyanosis of digits and no clubbing  NEURO: alert & oriented x 3 with fluent speech, no focal motor/sensory deficits BREAST:  RT breast  no palpable mass , LT breast no palpable mass, Breast exam benign.        LABORATORY DATA:  I have reviewed the data as listed    Latest Ref Rng & Units 08/25/2022   11:07 AM 01/29/2022    1:50 PM 01/03/2022     5:37 PM  CBC  WBC 4.0 - 10.5 K/uL 5.6  7.0  8.0   Hemoglobin 12.0 - 15.0 g/dL 13.5  13.2  13.7   Hematocrit 36.0 - 46.0 % 40.3  40.0  41.6   Platelets 150 - 400 K/uL 298  301  270         Latest Ref Rng & Units 08/25/2022   11:07 AM 01/29/2022    1:50 PM 01/03/2022    5:37 PM  CMP  Glucose 70 - 99 mg/dL 83  87  97   BUN 8 - 23 mg/dL '12  10  10   '$ Creatinine 0.44 - 1.00 mg/dL 0.83  0.80  0.79   Sodium 135 - 145 mmol/L 139  142  139   Potassium 3.5 - 5.1 mmol/L 3.8  3.2  2.9   Chloride 98 - 111 mmol/L 104  106  103   CO2 22 - 32 mmol/L '30  28  26   '$ Calcium 8.9 - 10.3 mg/dL 9.3  9.3  8.9   Total Protein 6.5 - 8.1 g/dL 7.1  8.0    Total Bilirubin 0.3 - 1.2 mg/dL 0.4  0.5    Alkaline Phos 38 - 126 U/L 92  107    AST 15 - 41 U/L 21  24    ALT 0 - 44 U/L 20  21        RADIOGRAPHIC STUDIES: I have personally reviewed the radiological images as listed and agreed with the findings in the report. No results found.    Orders Placed This Encounter  Procedures   MM Digital Screening    Standing Status:   Future    Standing Expiration Date:   08/25/2023    Order Specific Question:   Reason for Exam (SYMPTOM  OR DIAGNOSIS REQUIRED)    Answer:   screening    Order Specific Question:   Preferred imaging location?    Answer:   [facility]   Ambulatory referral to Neurology    Referral Priority:   Routine    Referral Type:   Consultation    Referral Reason:   Specialty Services Required    Requested Specialty:  Neurology    Number of Visits Requested:   1   All questions were answered. The patient knows to call the clinic with any problems, questions or concerns. No barriers to learning was detected. The total time spent in the appointment was 30 minutes.     Truitt Merle, MD 08/25/2022   Felicity Coyer, CMA, am acting as scribe for Truitt Merle, MD.   I have reviewed the above documentation for accuracy and completeness, and I agree with the above.

## 2022-08-31 ENCOUNTER — Encounter: Payer: Self-pay | Admitting: Hematology

## 2022-09-02 ENCOUNTER — Ambulatory Visit: Payer: Medicare Other | Attending: Cardiology | Admitting: Cardiology

## 2022-09-02 ENCOUNTER — Encounter: Payer: Self-pay | Admitting: Cardiology

## 2022-09-02 VITALS — BP 132/80 | HR 78 | Ht 68.0 in | Wt 202.2 lb

## 2022-09-02 DIAGNOSIS — I10 Essential (primary) hypertension: Secondary | ICD-10-CM | POA: Diagnosis present

## 2022-09-02 DIAGNOSIS — I4729 Other ventricular tachycardia: Secondary | ICD-10-CM

## 2022-09-02 DIAGNOSIS — E669 Obesity, unspecified: Secondary | ICD-10-CM

## 2022-09-02 DIAGNOSIS — I471 Supraventricular tachycardia, unspecified: Secondary | ICD-10-CM | POA: Diagnosis present

## 2022-09-02 MED ORDER — VALSARTAN 80 MG PO TABS: 80.0000 mg | ORAL_TABLET | Freq: Every day | ORAL | 3 refills | Status: DC

## 2022-09-02 NOTE — Patient Instructions (Signed)
Medication Instructions:  Your physician has recommended you make the following change in your medication:  STOP: Losartan  START: Valsartan 80 mg once daily  Please take your blood pressure daily for 1 week and send in a MyChart message or call with the readings. Please include heart rates.   HOW TO TAKE YOUR BLOOD PRESSURE: Rest 5 minutes before taking your blood pressure. Don't smoke or drink caffeinated beverages for at least 30 minutes before. Take your blood pressure before (not after) you eat. Sit comfortably with your back supported and both feet on the floor (don't cross your legs). Elevate your arm to heart level on a table or a desk. Use the proper sized cuff. It should fit smoothly and snugly around your bare upper arm. There should be enough room to slip a fingertip under the cuff. The bottom edge of the cuff should be 1 inch above the crease of the elbow. Ideally, take 3 measurements at one sitting and record the average.  *If you need a refill on your cardiac medications before your next appointment, please call your pharmacy*   Lab Work: None  Testing/Procedures: None   Follow-Up: At [facility], you and your health needs are our priority.  As part of our continuing mission to provide you with exceptional heart care, we have created designated Provider Care Teams.  These Care Teams include your primary Cardiologist (physician) and Advanced Practice Providers (APPs -  Physician Assistants and Nurse Practitioners) who all work together to provide you with the care you need, when you need it.  We recommend signing up for the patient portal called "MyChart".  Sign up information is provided on this After Visit Summary.  MyChart is used to connect with patients for Virtual Visits (Telemedicine).  Patients are able to view lab/test results, encounter notes, upcoming appointments, etc.  Non-urgent messages can be sent to your provider as well.   To learn more about  what you can do with MyChart, go to [email or web address].    Your next appointment:   6 month(s)  Provider:   Berniece Salines, DO

## 2022-09-07 NOTE — Progress Notes (Signed)
Cardiology Office Note:    Date:  09/07/2022   ID:  Mary Wise 22-Jun-1951, MRN [id number]  PCP:  Mary Andrea, MD  Cardiologist:  Mary Salines, DO  Electrophysiologist:  None   Referring MD: Mary Andrea, MD   " I am doing fine"   History of Present Illness:    Mary Wise is a 72 y.o. female with a hx of hypertension, hyperlipidemia, history of breast cancer status postlumpectomy chemo and radiation, anxiety here today for follow-up visit.  I first saw the patient on April 10, 2022 at that time she was experiencing significant fluttering as well as intermittent palpitations.  I placed a monitor on the patient.  She is here today to discuss those results.  She is here today with her daughter Mary Wise.  She tells me she still is experiencing the palpitations.  She is concerned about that because she is starting to get lightheaded.  Past Medical History:  Diagnosis Date   Anxiety    Asthma    Cancer ([place])    left breast cancer   Family history of cancer    Genetic testing 02/01/2018   Multi-Cancer panel (83 genes) @ [facility] - No pathogenic mutations detected   GERD (gastroesophageal reflux disease)    Hemorrhoids    History of radiation therapy 05/27/18- 06/23/18   Left Breast 40.05 Gy in 15 fractions, left breast boost 10 Gy in 5 fractions.    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    Past Surgical History:  Procedure Laterality Date   ABDOMINAL HYSTERECTOMY     APPENDECTOMY     BREAST LUMPECTOMY Left 2018   CATARACT EXTRACTION Bilateral    CESAREAN SECTION     CHOLECYSTECTOMY     COLONOSCOPY WITH PROPOFOL N/A 01/15/2021   Procedure: COLONOSCOPY WITH PROPOFOL;  Surgeon: Harvel Quale, MD;  [facility];  Service: Gastroenterology;  Laterality: N/A;  10:00   ERCP     ESOPHAGEAL DILATION N/A 04/10/2021   Procedure: ESOPHAGEAL DILATION;  Surgeon: Rogene Houston, MD;  [facility];  Service: Endoscopy;  Laterality: N/A;   ESOPHAGOGASTRODUODENOSCOPY (EGD) WITH PROPOFOL N/A 04/10/2021   Procedure: ESOPHAGOGASTRODUODENOSCOPY (EGD) WITH PROPOFOL;  Surgeon: Rogene Houston, MD;  [facility];  Service: Endoscopy;  Laterality: N/A;  10:55   IR FLUORO GUIDE PORT INSERTION RIGHT  10/05/2017   IR REMOVAL TUN ACCESS W/ PORT W/O FL MOD SED  05/13/2018   IR US GUIDE VASC ACCESS RIGHT  10/05/2017   LIPOMA EXCISION Left 03/16/2018   left shoulder   LIVER SURGERY     MASS EXCISION N/A 03/30/2020   Procedure: EXCISION OF SEBACEOUS CYST ON LEFT NECK, SEBACEOUSE CYST ON BACK AND ATYPICAL MOLE ON RIGHT NECK, AND LEFT AXILLA SKIN TAG;  Surgeon: Virl Cagey, MD;  Location: [facility];  Service: General;  Laterality: N/A;   PARTIAL MASTECTOMY WITH AXILLARY SENTINEL LYMPH NODE BIOPSY Left 07/31/2017   Procedure: LEFT PARTIAL MASTECTOMY WITH AXILLARY SENTINEL LYMPH NODE BIOPSY;  Surgeon: Virl Cagey, MD;  Location: [facility];  Service: General;  Laterality: Left;   POLYPECTOMY  01/15/2021   Procedure: POLYPECTOMY;  Surgeon: Harvel Quale, MD;  [facility];  Service: Gastroenterology;;   TOTAL KNEE ARTHROPLASTY Left 07/05/2019   Procedure: TOTAL KNEE ARTHROPLASTY;  Surgeon: Renette Butters,  MD;  Location: [facility];  Service: Orthopedics;  Laterality: Left;    Current Medications: Current Meds  Medication Sig   albuterol (PROVENTIL) (2.5 MG/3ML) 0.083% nebulizer solution Take 3 mLs (2.5 mg total) by nebulization every 6 (six) hours as needed for wheezing or shortness of breath.   albuterol (VENTOLIN HFA) 108 (90 Base) MCG/ACT inhaler INHALE 2 PUFFS BY MOUTH EVERY 6 HOURS AS NEEDED FOR WHEEZING FOR SHORTNESS OF BREATH (Patient taking differently: Inhale 2 puffs into the lungs every 6 (six) hours as needed for shortness of breath or wheezing.)   ALPRAZolam (XANAX) 1 MG tablet Take 1 tablet (1 mg total) by mouth 2  (two) times daily as needed. for anxiety (Patient taking differently: Take 1 mg by mouth 2 (two) times daily.)   diltiazem (CARDIZEM CD) 180 MG 24 hr capsule Take 1 capsule (180 mg total) by mouth daily.   ibuprofen (ADVIL) 800 MG tablet Take 1 tablet (800 mg total) by mouth every 8 (eight) hours as needed. (Patient taking differently: Take 400-800 mg by mouth every 8 (eight) hours as needed for moderate pain.)   omeprazole (PRILOSEC) 20 MG capsule Take 1 capsule (20 mg total) by mouth 2 (two) times daily before a meal.   ondansetron (ZOFRAN) 4 MG tablet Take 4 mg by mouth every 8 (eight) hours as needed for nausea or vomiting.   Ophthalmic Irrigation Solution (EYE [place] OP) Place 1 application into both eyes 3 (three) times daily as needed (eye irritation).   SUMAtriptan (IMITREX) 100 MG tablet May repeat in 2 hours if headache persists or recurs. (Patient taking differently: Take 100 mg by mouth every 2 (two) hours as needed for migraine (May repeat in 2 hours if headache persists or recurs.).)   tiZANidine (ZANAFLEX) 4 MG tablet Take 1 tablet (4 mg total) by mouth every 8 (eight) hours as needed for muscle spasms.   valsartan (DIOVAN) 80 MG tablet Take 1 tablet (80 mg total) by mouth daily.   zolpidem (AMBIEN) 10 MG tablet Take 1 tablet (10 mg total) by mouth at bedtime as needed. for sleep   [DISCONTINUED] losartan (COZAAR) 100 MG tablet Take 100 mg (1 tablet) once daily.     Allergies:   Onion, Penicillins, Sulfa antibiotics, Yellow dye, Tape, Clindamycin/lincomycin, Latex, and Lipitor [atorvastatin calcium]   Social History   Socioeconomic History   Marital status: Single    Spouse name: Not on file   Number of children: Not on file   Years of education: Not on file   Highest education level: Not on file  Occupational History   Not on file  Tobacco Use   Smoking status: Never   Smokeless tobacco: Never  Vaping Use   Vaping Use: Never used  Substance and Sexual Activity   Alcohol  use: No   Drug use: Never   Sexual activity: Never  Other Topics Concern   Not on file  Social History Narrative   Lives in [place] on [address].    Social Determinants of Health   Financial Resource Strain: Not on file  Food Insecurity: Not on file  Transportation Needs: No Transportation Needs (07/23/2018)   PRAPARE - Hydrologist (Medical): No    Lack of Transportation (Non-Medical): No  Physical Activity: Not on file  Stress: Not on file  Social Connections: Not on file     Family History: The patient's family history includes Breast cancer in her maternal grandmother; Cancer in her paternal  aunt, paternal grandfather, paternal grandmother, paternal uncle, paternal uncle, paternal uncle, and another family member; Cancer (age of onset: 81) in her mother; Cancer (age of onset: 70) in her father; Diabetes in her brother, sister, sister, and sister; Lung cancer in her maternal aunt; Melanoma in an other family member; Ovarian cancer (age of onset: 69) in her sister; Stomach cancer in her cousin; Stomach cancer (age of onset: 31) in her maternal aunt.  ROS:   Review of Systems  Constitution: Negative for decreased appetite, fever and weight gain.  HENT: Negative for congestion, ear discharge, hoarse voice and sore throat.   Eyes: Negative for discharge, redness, vision loss in right eye and visual halos.  Cardiovascular: Negative for chest pain, dyspnea on exertion, leg swelling, orthopnea and palpitations.  Respiratory: Negative for cough, hemoptysis, shortness of breath and snoring.   Endocrine: Negative for heat intolerance and polyphagia.  Hematologic/Lymphatic: Negative for bleeding problem. Does not bruise/bleed easily.  Skin: Negative for flushing, nail changes, rash and suspicious lesions.  Musculoskeletal: Negative for arthritis, joint pain, muscle cramps, myalgias, neck pain and stiffness.  Gastrointestinal: Negative for abdominal  pain, bowel incontinence, diarrhea and excessive appetite.  Genitourinary: Negative for decreased libido, genital sores and incomplete emptying.  Neurological: Negative for brief paralysis, focal weakness, headaches and loss of balance.  Psychiatric/Behavioral: Negative for altered mental status, depression and suicidal ideas.  Allergic/Immunologic: Negative for HIV exposure and persistent infections.    EKGs/Labs/Other Studies Reviewed:    The following studies were reviewed today:   EKG: None today  ZIO monitor results: Patch Wear Time:  13 days and 0 hours (2023-09-14T19:20:07-399 to 2023-09-27T19:40:54-0400)   Patient had a min HR of 53 bpm, max HR of 255 bpm, and avg HR of 83 bpm. Predominant underlying rhythm was Sinus Rhythm.    3 Ventricular Tachycardia runs occurred, the run with the fastest interval lasting 5 beats with a max rate of 184 bpm, the longest lasting 4 beats with an avg rate of 105 bpm.  55 Supraventricular Tachycardia runs occurred, the run with the fastest interval lasting 16 beats with a max rate of 255 bpm, the longest lasting 16 beats with an avg rate of 97 bpm.    Isolated SVEs were rare (<1.0%), SVE Couplets were rare (<1.0%), and SVE Triplets were rare (<1.0%). Isolated VEs were rare (<1.0%, 2173), VE Couplets were rare (<1.0%, 18), and no VE Triplets were present. Ventricular Trigeminy was present.    No symptoms reported.   Conclusion: This study shows evidence of the following:                         1. Nonsustained ventricular tachycardia                          2. Paroxysmal atrial tachycardia  Transthoracic echocardiogram 04/16/2022 IMPRESSIONS     1. Left ventricular ejection fraction, by estimation, is 65 to 70%. The  left ventricle has normal function. The left ventricle has no regional  wall motion abnormalities. Left ventricular diastolic parameters are  consistent with Grade I diastolic  dysfunction (impaired relaxation).   2. Right  ventricular systolic function is normal. The right ventricular  size is normal. Tricuspid regurgitation signal is inadequate for assessing  PA pressure.   3. A small and somewhat organized pericardial effusion is present. The  pericardial effusion is posterior to the left ventricle.   4. The mitral valve is  grossly normal. Trivial mitral valve  regurgitation.   5. The aortic valve is tricuspid. Aortic valve regurgitation is not  visualized.   6. The inferior vena cava is normal in size with greater than 50%  respiratory variability, suggesting right atrial pressure of 3 mmHg.   Comparison(s): Prior images unable to be directly viewed.   FINDINGS   Left Ventricle: Left ventricular ejection fraction, by estimation, is 65  to 70%. The left ventricle has normal function. The left ventricle has no  regional wall motion abnormalities. The left ventricular internal cavity  size was normal in size. There is   borderline left ventricular hypertrophy. Left ventricular diastolic  parameters are consistent with Grade I diastolic dysfunction (impaired  relaxation).   Right Ventricle: The right ventricular size is normal. No increase in  right ventricular wall thickness. Right ventricular systolic function is  normal. Tricuspid regurgitation signal is inadequate for assessing PA  pressure.   Left Atrium: Left atrial size was normal in size.   Right Atrium: Right atrial size was normal in size.   Pericardium: A small pericardial effusion is present. The pericardial  effusion is posterior to the left ventricle. Presence of epicardial fat  layer.   Mitral Valve: The mitral valve is grossly normal. Trivial mitral valve  regurgitation.   Tricuspid Valve: The tricuspid valve is grossly normal. Tricuspid valve  regurgitation is trivial.   Aortic Valve: The aortic valve is tricuspid. Aortic valve regurgitation is  not visualized.   Pulmonic Valve: The pulmonic valve was grossly normal.  Pulmonic valve  regurgitation is trivial.   Aorta: The aortic root is normal in size and structure.   Venous: The inferior vena cava is normal in size with greater than 50%  respiratory variability, suggesting right atrial pressure of 3 mmHg.   IAS/Shunts: No atrial level shunt detected by color flow Doppler   Recent Labs: 01/03/2022: Magnesium 1.8 08/25/2022: ALT 20; BUN 12; Creatinine 0.83; Hemoglobin 13.5; Platelet Count 298; Potassium 3.8; Sodium 139  Recent Lipid Panel    Component Value Date/Time   CHOL 229 (H) 09/10/2020 1428   TRIG 115 09/10/2020 1428   HDL 59 09/10/2020 1428   CHOLHDL 3.9 09/10/2020 1428   LDLCALC 147 (H) 09/10/2020 1428    Physical Exam:    VS:  BP 132/80   Pulse 78   Ht '5\' 8"'$  (1.727 m)   Wt 91.7 kg   SpO2 98%   BMI 30.74 kg/m     Wt Readings from Last 3 Encounters:  09/02/22 91.7 kg  08/25/22 92.4 kg  06/25/22 90.2 kg     GEN: Well nourished, well developed in no acute distress HEENT: Normal NECK: No JVD; No carotid bruits LYMPHATICS: No lymphadenopathy CARDIAC: S1S2 noted,RRR, no murmurs, rubs, gallops RESPIRATORY:  Clear to auscultation without rales, wheezing or rhonchi  ABDOMEN: Soft, non-tender, non-distended, +bowel sounds, no guarding. EXTREMITIES: No edema, No cyanosis, no clubbing MUSCULOSKELETAL:  No deformity  SKIN: Warm and dry NEUROLOGIC:  Alert and oriented x 3, non-focal PSYCHIATRIC:  Normal affect, good insight  ASSESSMENT:    1. Primary hypertension   2. NSVT (nonsustained ventricular tachycardia) ([place])   3. PSVT (paroxysmal supraventricular tachycardia)   4. Obesity (BMI 30-39.9)     PLAN:    Her palpitations have improved but she is hypertensive in the office today.  Will keep dose of cardizem and switch from losartan to valsartan.  She will take her bp and send me update in 2 weeks.  In  terms of her hypertension she is on losartan 100 mg daily, with addition of Cardizem that she will also help as she was  not at [place] today.    The patient is in agreement with the above plan. The patient left the office in stable condition.  The patient will follow up in 6 months or sooner if needed.   Medication Adjustments/Labs and Tests Ordered: Current medicines are reviewed at length with the patient today.  Concerns regarding medicines are outlined above.  No orders of the defined types were placed in this encounter.  Meds ordered this encounter  Medications   valsartan (DIOVAN) 80 MG tablet    Sig: Take 1 tablet (80 mg total) by mouth daily.    Dispense:  90 tablet    Refill:  3    Patient Instructions  Medication Instructions:  Your physician has recommended you make the following change in your medication:  STOP: Losartan  START: Valsartan 80 mg once daily  Please take your blood pressure daily for 1 week and send in a MyChart message or call with the readings. Please include heart rates.   HOW TO TAKE YOUR BLOOD PRESSURE: Rest 5 minutes before taking your blood pressure. Don't smoke or drink caffeinated beverages for at least 30 minutes before. Take your blood pressure before (not after) you eat. Sit comfortably with your back supported and both feet on the floor (don't cross your legs). Elevate your arm to heart level on a table or a desk. Use the proper sized cuff. It should fit smoothly and snugly around your bare upper arm. There should be enough room to slip a fingertip under the cuff. The bottom edge of the cuff should be 1 inch above the crease of the elbow. Ideally, take 3 measurements at one sitting and record the average.  *If you need a refill on your cardiac medications before your next appointment, please call your pharmacy*   Lab Work: None  Testing/Procedures: None   Follow-Up: At [facility], you and your health needs are our priority.  As part of our continuing mission to provide you with exceptional heart care, we have created designated Provider  Care Teams.  These Care Teams include your primary Cardiologist (physician) and Advanced Practice Providers (APPs -  Physician Assistants and Nurse Practitioners) who all work together to provide you with the care you need, when you need it.  We recommend signing up for the patient portal called "MyChart".  Sign up information is provided on this After Visit Summary.  MyChart is used to connect with patients for Virtual Visits (Telemedicine).  Patients are able to view lab/test results, encounter notes, upcoming appointments, etc.  Non-urgent messages can be sent to your provider as well.   To learn more about what you can do with MyChart, go to [email or web address].    Your next appointment:   6 month(s)  Provider:   Berniece Salines, DO      Adopting a Healthy Lifestyle.  Know what a healthy weight is for you (roughly BMI <25) and aim to maintain this   Aim for 7+ servings of fruits and vegetables daily   65-80+ fluid ounces of water or unsweet tea for healthy kidneys   Limit to max 1 drink of alcohol per day; avoid smoking/tobacco   Limit animal fats in diet for cholesterol and heart health - choose grass fed whenever available   Avoid highly processed foods, and foods high in saturated/trans fats   Aim for  low stress - take time to unwind and care for your mental health   Aim for 150 min of moderate intensity exercise weekly for heart health, and weights twice weekly for bone health   Aim for 7-9 hours of sleep daily   When it comes to diets, agreement about the perfect plan isnt easy to find, even among the experts. Experts at the [employer] developed an idea known as the Healthy Eating Plate. Just imagine a plate divided into logical, healthy portions.   The emphasis is on diet quality:   Load up on vegetables and fruits - one-half of your plate: Aim for color and variety, and remember that potatoes dont count.   Go for whole grains - one-quarter of  your plate: Whole wheat, barley, wheat berries, quinoa, oats, brown rice, and foods made with them. If you want pasta, go with whole wheat pasta.   Protein power - one-quarter of your plate: Fish, chicken, beans, and nuts are all healthy, versatile protein sources. Limit red meat.   The diet, however, does go beyond the plate, offering a few other suggestions.   Use healthy plant oils, such as olive, canola, soy, corn, sunflower and peanut. Check the labels, and avoid partially hydrogenated oil, which have unhealthy trans fats.   If youre thirsty, drink water. Coffee and tea are good in moderation, but skip sugary drinks and limit milk and dairy products to one or two daily servings.   The type of carbohydrate in the diet is more important than the amount. Some sources of carbohydrates, such as vegetables, fruits, whole grains, and beans-are healthier than others.   Finally, stay active  Signed, Mary Salines, DO  09/07/2022 6:12 PM    [facility]

## 2022-09-23 ENCOUNTER — Inpatient Hospital Stay: Payer: Medicare Other | Admitting: Internal Medicine

## 2022-09-26 ENCOUNTER — Encounter: Payer: Self-pay | Admitting: Hematology

## 2022-09-30 ENCOUNTER — Telehealth: Payer: Self-pay | Admitting: Internal Medicine

## 2022-09-30 NOTE — Telephone Encounter (Signed)
Per 2/27 IB reached out to reschedule patient for missed appointment with [facility], patient tested positive for Covid, patient is aware of date and time of appointment change.

## 2022-10-01 ENCOUNTER — Telehealth: Payer: Self-pay | Admitting: Internal Medicine

## 2022-10-21 ENCOUNTER — Ambulatory Visit (INDEPENDENT_AMBULATORY_CARE_PROVIDER_SITE_OTHER): Payer: Medicare Other | Admitting: General Surgery

## 2022-10-21 ENCOUNTER — Encounter: Payer: Self-pay | Admitting: General Surgery

## 2022-10-21 VITALS — BP 165/94 | HR 72 | Temp 97.6°F | Resp 16 | Ht 68.0 in | Wt 202.0 lb

## 2022-10-21 DIAGNOSIS — D487 Neoplasm of uncertain behavior of other specified sites: Secondary | ICD-10-CM

## 2022-10-21 DIAGNOSIS — L989 Disorder of the skin and subcutaneous tissue, unspecified: Secondary | ICD-10-CM | POA: Diagnosis not present

## 2022-10-21 DIAGNOSIS — L918 Other hypertrophic disorders of the skin: Secondary | ICD-10-CM

## 2022-10-21 DIAGNOSIS — D492 Neoplasm of unspecified behavior of bone, soft tissue, and skin: Secondary | ICD-10-CM | POA: Diagnosis not present

## 2022-10-21 NOTE — Patient Instructions (Signed)
Will plan to excise your mole on your chest, the skin tag between the breast and the right forearm skin lesion

## 2022-10-21 NOTE — Progress Notes (Signed)
[facility] History and Physical  Reason for Referral:*** Referring Physician: ***  Chief Complaint   Has some moles she would like to be checked     Mary Wise is a 72 y.o. female.  HPI: ***.  The *** started *** and has had a duration of ***.  It is associated with ***.  The *** is improved with ***, and is made worse with ***.    Quality*** Context***  Past Medical History:  Diagnosis Date   Anxiety    Asthma    Cancer (HCC)    left breast cancer   Family history of cancer    Genetic testing 02/01/2018   Multi-Cancer panel (83 genes) @ [facility] - No pathogenic mutations detected   GERD (gastroesophageal reflux disease)    Hemorrhoids    History of radiation therapy 05/27/18- 06/23/18   Left Breast 40.05 Gy in 15 fractions, left breast boost 10 Gy in 5 fractions.    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    Past Surgical History:  Procedure Laterality Date   ABDOMINAL HYSTERECTOMY     APPENDECTOMY     BREAST LUMPECTOMY Left 2018   CATARACT EXTRACTION Bilateral    CESAREAN SECTION     CHOLECYSTECTOMY     COLONOSCOPY WITH PROPOFOL N/A 01/15/2021   Procedure: COLONOSCOPY WITH PROPOFOL;  Surgeon: Dolores Frame, MD;  [facility];  Service: Gastroenterology;  Laterality: N/A;  10:00   ERCP     ESOPHAGEAL DILATION N/A 04/10/2021   Procedure: ESOPHAGEAL DILATION;  Surgeon: Malissa Hippo, MD;  [facility];  Service: Endoscopy;  Laterality: N/A;   ESOPHAGOGASTRODUODENOSCOPY (EGD) WITH PROPOFOL N/A 04/10/2021   Procedure: ESOPHAGOGASTRODUODENOSCOPY (EGD) WITH PROPOFOL;  Surgeon: Malissa Hippo, MD;  [facility];  Service: Endoscopy;  Laterality: N/A;  10:55   IR FLUORO GUIDE PORT INSERTION RIGHT  10/05/2017   IR REMOVAL TUN ACCESS W/ PORT W/O FL MOD SED  05/13/2018   IR US GUIDE VASC ACCESS RIGHT  10/05/2017   LIPOMA EXCISION  Left 03/16/2018   left shoulder   LIVER SURGERY     MASS EXCISION N/A 03/30/2020   Procedure: EXCISION OF SEBACEOUS CYST ON LEFT NECK, SEBACEOUSE CYST ON BACK AND ATYPICAL MOLE ON RIGHT NECK, AND LEFT AXILLA SKIN TAG;  Surgeon: Lucretia Roers, MD;  Location: [facility];  Service: General;  Laterality: N/A;   PARTIAL MASTECTOMY WITH AXILLARY SENTINEL LYMPH NODE BIOPSY Left 07/31/2017   Procedure: LEFT PARTIAL MASTECTOMY WITH AXILLARY SENTINEL LYMPH NODE BIOPSY;  Surgeon: Lucretia Roers, MD;  Location: [facility];  Service: General;  Laterality: Left;   POLYPECTOMY  01/15/2021   Procedure: POLYPECTOMY;  Surgeon: Dolores Frame, MD;  [facility];  Service: Gastroenterology;;   TOTAL KNEE ARTHROPLASTY Left 07/05/2019   Procedure: TOTAL KNEE ARTHROPLASTY;  Surgeon: Sheral Apley, MD;  Location: [facility];  Service: Orthopedics;  Laterality: Left;    Family History  Problem Relation Age of Onset   Cancer Mother 47       unk. primary; deceased 62   Cancer Father 1       brain cancer; deceased 18   Ovarian cancer Sister 83       deceased 76   Diabetes Brother    Breast cancer Maternal Grandmother  dx 70s; deceased 87   Cancer Other        "female ca"   Diabetes Sister    Diabetes Sister    Diabetes Sister    Cancer Paternal Uncle        brain cancer    Cancer Paternal Grandmother        "female ca"   Cancer Paternal Grandfather        lung cancer   Cancer Paternal Uncle        brain tumor    Cancer Paternal Uncle        brain tumor    Stomach cancer Maternal Aunt 45       deceased 42   Cancer Paternal Aunt        ink. primary in 2 aunts   Melanoma Other    Lung cancer Maternal Aunt    Stomach cancer Cousin        2 sons of an unaffected maternal uncle    Social History   Tobacco Use   Smoking status: Never   Smokeless tobacco: Never  Vaping Use   Vaping Use: Never used  Substance Use Topics   Alcohol use: No   Drug use: Never     Medications: {medication reviewed/display:3041432} Allergies as of 10/21/2022       Reactions   Onion Other (See Comments)   REACTION: results in coma state   Penicillins Hives, Shortness Of Breath, Swelling   Throat swelling Has patient had a PCN reaction causing immediate rash, facial/tongue/throat swelling, SOB or lightheadedness with hypotension: Yes Has patient had a PCN reaction causing severe rash involving mucus membranes or skin necrosis: No Has patient had a PCN reaction that required hospitalization: Yes Has patient had a PCN reaction occurring within the last 10 years: No If all of the above answers are "NO", then may proceed with Cephalosporin use.   Sulfa Antibiotics Hives, Shortness Of Breath   Yellow Dye    REACTION: #5 and #9 "will kill me"   Tape Swelling   Pt states she had tape and Tegaderm used on her in [place]. This caused swelling to her face and neck. Wounds and bleeding from area tape/tegaderm was used.    Clindamycin/lincomycin Hives, Nausea And Vomiting   Latex Itching   Lipitor [atorvastatin Calcium] Hives        Medication List        Accurate as of October 21, 2022 12:43 PM. If you have any questions, ask your nurse or doctor.          albuterol (2.5 MG/3ML) 0.083% nebulizer solution Commonly known as: PROVENTIL Take 3 mLs (2.5 mg total) by nebulization every 6 (six) hours as needed for wheezing or shortness of breath. What changed: Another medication with the same name was changed. Make sure you understand how and when to take each.   albuterol 108 (90 Base) MCG/ACT inhaler Commonly known as: VENTOLIN HFA INHALE 2 PUFFS BY MOUTH EVERY 6 HOURS AS NEEDED FOR WHEEZING FOR SHORTNESS OF BREATH What changed:  how much to take how to take this when to take this reasons to take this additional instructions   ALPRAZolam 1 MG tablet Commonly known as: XANAX Take 1 tablet (1 mg total) by mouth 2 (two) times daily as needed. for  anxiety What changed:  when to take this additional instructions   diltiazem 180 MG 24 hr capsule Commonly known as: CARDIZEM CD Take 1 capsule (180 mg total) by mouth daily.  EYE WASH OP Place 1 application into both eyes 3 (three) times daily as needed (eye irritation).   ibuprofen 800 MG tablet Commonly known as: ADVIL Take 1 tablet (800 mg total) by mouth every 8 (eight) hours as needed. What changed:  how much to take reasons to take this   omeprazole 20 MG capsule Commonly known as: PRILOSEC Take 1 capsule (20 mg total) by mouth 2 (two) times daily before a meal.   ondansetron 4 MG tablet Commonly known as: ZOFRAN Take 4 mg by mouth every 8 (eight) hours as needed for nausea or vomiting.   SUMAtriptan 100 MG tablet Commonly known as: IMITREX May repeat in 2 hours if headache persists or recurs. What changed:  how much to take how to take this when to take this reasons to take this additional instructions   tiZANidine 4 MG tablet Commonly known as: Zanaflex Take 1 tablet (4 mg total) by mouth every 8 (eight) hours as needed for muscle spasms.   valsartan 80 MG tablet Commonly known as: Diovan Take 1 tablet (80 mg total) by mouth daily.   zolpidem 10 MG tablet Commonly known as: AMBIEN Take 1 tablet (10 mg total) by mouth at bedtime as needed. for sleep         ROS:  {Review of Systems:30496}  Blood pressure (!) 165/94, pulse 72, temperature 97.6 F (36.4 C), temperature source Other (Comment), resp. rate 16, height 5\' 8"  (1.727 m), weight 202 lb (91.6 kg), SpO2 97 %. Physical Exam  Results: No results found for this or any previous visit (from the past 48 hour(s)).  No results found.   Assessment & Plan:  Mary Wise is a 72 y.o. female with *** -*** -*** -Follow up ***  All questions were answered to the satisfaction of the patient and family***.  The risk and benefits of *** were discussed including but not limited to ***.  After  careful consideration, DORISMAR BASSE has decided to ***.    Lucretia Roers 10/21/2022, 12:43 PM

## 2022-10-23 DIAGNOSIS — D487 Neoplasm of uncertain behavior of other specified sites: Secondary | ICD-10-CM | POA: Insufficient documentation

## 2022-10-23 DIAGNOSIS — D492 Neoplasm of unspecified behavior of bone, soft tissue, and skin: Secondary | ICD-10-CM | POA: Insufficient documentation

## 2022-10-23 DIAGNOSIS — L989 Disorder of the skin and subcutaneous tissue, unspecified: Secondary | ICD-10-CM | POA: Insufficient documentation

## 2022-10-23 NOTE — H&P (Signed)
[facility] History and Physical  Reason for Referral: Multiple skin lesions Referring Physician: Leslie Andrea, MD   Chief Complaint   Has some moles she would like to be checked     Mary Wise is a 72 y.o. female.  HPI: Ms. Rhyne is known to me after a partial mastectomy and SLNB for a breast cancer and prior removal of multiple skin lesions and cyst in the past. She has additional lesions that she is worried about and asked her PCP about at her last visit. They recommended seeing me to get possible removal.  Past Medical History:  Diagnosis Date   Anxiety    Asthma    Cancer ([place])    left breast cancer   Family history of cancer    Genetic testing 02/01/2018   Multi-Cancer panel (83 genes) @ [facility] - No pathogenic mutations detected   GERD (gastroesophageal reflux disease)    Hemorrhoids    History of radiation therapy 05/27/18- 06/23/18   Left Breast 40.05 Gy in 15 fractions, left breast boost 10 Gy in 5 fractions.    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    Past Surgical History:  Procedure Laterality Date   ABDOMINAL HYSTERECTOMY     APPENDECTOMY     BREAST LUMPECTOMY Left 2018   CATARACT EXTRACTION Bilateral    CESAREAN SECTION     CHOLECYSTECTOMY     COLONOSCOPY WITH PROPOFOL N/A 01/15/2021   Procedure: COLONOSCOPY WITH PROPOFOL;  Surgeon: Harvel Quale, MD;  [facility];  Service: Gastroenterology;  Laterality: N/A;  10:00   ERCP     ESOPHAGEAL DILATION N/A 04/10/2021   Procedure: ESOPHAGEAL DILATION;  Surgeon: Rogene Houston, MD;  [facility];  Service: Endoscopy;  Laterality: N/A;   ESOPHAGOGASTRODUODENOSCOPY (EGD) WITH PROPOFOL N/A 04/10/2021   Procedure: ESOPHAGOGASTRODUODENOSCOPY (EGD) WITH PROPOFOL;  Surgeon: Rogene Houston, MD;  [facility];  Service: Endoscopy;  Laterality: N/A;  10:55   IR FLUORO  GUIDE PORT INSERTION RIGHT  10/05/2017   IR REMOVAL TUN ACCESS W/ PORT W/O FL MOD SED  05/13/2018   IR US GUIDE VASC ACCESS RIGHT  10/05/2017   LIPOMA EXCISION Left 03/16/2018   left shoulder   LIVER SURGERY     MASS EXCISION N/A 03/30/2020   Procedure: EXCISION OF SEBACEOUS CYST ON LEFT NECK, SEBACEOUSE CYST ON BACK AND ATYPICAL MOLE ON RIGHT NECK, AND LEFT AXILLA SKIN TAG;  Surgeon: Virl Cagey, MD;  Location: [facility];  Service: General;  Laterality: N/A;   PARTIAL MASTECTOMY WITH AXILLARY SENTINEL LYMPH NODE BIOPSY Left 07/31/2017   Procedure: LEFT PARTIAL MASTECTOMY WITH AXILLARY SENTINEL LYMPH NODE BIOPSY;  Surgeon: Virl Cagey, MD;  Location: [facility];  Service: General;  Laterality: Left;   POLYPECTOMY  01/15/2021   Procedure: POLYPECTOMY;  Surgeon: Harvel Quale, MD;  [facility];  Service: Gastroenterology;;   TOTAL KNEE ARTHROPLASTY Left 07/05/2019   Procedure: TOTAL KNEE ARTHROPLASTY;  Surgeon: Renette Butters, MD;  Location: [facility];  Service: Orthopedics;  Laterality: Left;    Family History  Problem Relation Age of Onset   Cancer Mother 71       unk. primary; deceased 72   Cancer Father 60       brain cancer; deceased 35   Ovarian cancer Sister 77  dx 40s; deceased 84   Cancer Other        "female ca"   Diabetes Sister    Diabetes Sister    Diabetes Sister    Cancer Paternal Uncle        brain cancer    Cancer Paternal Grandmother        "female ca"   Cancer Paternal Grandfather        lung cancer   Cancer Paternal Uncle        brain tumor    Cancer Paternal Uncle        brain tumor    Stomach cancer Maternal Aunt 87       deceased 89   Cancer Paternal Aunt        ink. primary in 2 aunts   Melanoma Other    Lung cancer Maternal Aunt    Stomach cancer Cousin        2 sons of an unaffected maternal uncle    Social History   Tobacco Use   Smoking status: Never   Smokeless tobacco: Never  Vaping Use   Vaping Use: Never used  Substance Use Topics   Alcohol use: No   Drug use: Never     Medications: {medication reviewed/display:3041432} Allergies as of 10/21/2022       Reactions   Onion Other (See Comments)   REACTION: results in coma state   Penicillins Hives, Shortness Of Breath, Swelling   Throat swelling Has patient had a PCN reaction causing immediate rash, facial/tongue/throat swelling, SOB or lightheadedness with hypotension: Yes Has patient had a PCN reaction causing severe rash involving mucus membranes or skin necrosis: No Has patient had a PCN reaction that required hospitalization: Yes Has patient had a PCN reaction occurring within the last 10 years: No If all of the above answers are "NO", then may proceed with Cephalosporin use.   Sulfa Antibiotics Hives, Shortness Of Breath   Yellow Dye    REACTION: #5 and #9 "will kill me"   Tape Swelling   Pt states she had tape and Tegaderm used on her in [place]. This caused swelling to her face and neck. Wounds and bleeding from area tape/tegaderm was used.    Clindamycin/lincomycin Hives, Nausea And Vomiting   Latex Itching   Lipitor [atorvastatin Calcium] Hives        Medication List        Accurate as of October 21, 2022 12:43 PM. If you have any questions, ask your nurse or doctor.          albuterol (2.5 MG/3ML) 0.083% nebulizer solution Commonly known as: PROVENTIL Take 3 mLs (2.5 mg total) by nebulization every 6 (six) hours as needed for wheezing or shortness of breath. What changed: Another medication with the same name was changed. Make sure you understand how and when to take each.   albuterol 108 (90 Base) MCG/ACT inhaler Commonly known as: VENTOLIN HFA INHALE 2 PUFFS BY MOUTH EVERY 6 HOURS AS NEEDED FOR WHEEZING FOR SHORTNESS OF BREATH What changed:  how much to take how to take this when to take this reasons to take this additional instructions   ALPRAZolam 1 MG tablet Commonly known as: XANAX Take 1 tablet (1 mg total) by mouth 2 (two) times daily as needed. for  anxiety What changed:  when to take this additional instructions   diltiazem 180 MG 24 hr capsule Commonly known as: CARDIZEM CD Take 1 capsule (180 mg total) by mouth daily.     additional instructions   diltiazem 180 MG 24 hr capsule Commonly known as: CARDIZEM CD Take 1 capsule (180 mg total) by mouth daily.   EYE [place] OP Place 1 application into both eyes 3 (three) times daily as needed (eye irritation).   ibuprofen 800 MG tablet Commonly known as: ADVIL Take 1 tablet (800 mg total) by mouth every 8 (eight) hours as needed. What changed:  how much to take reasons to take this   omeprazole 20 MG capsule Commonly known as: PRILOSEC Take 1 capsule (20 mg total) by mouth 2 (two) times daily before a meal.   ondansetron 4 MG tablet Commonly known as: ZOFRAN Take 4 mg by mouth every 8 (eight) hours as needed for nausea or vomiting.   SUMAtriptan 100 MG tablet Commonly known as: IMITREX May repeat in 2 hours if headache persists or recurs. What changed:  how much to take how to take this when to take this reasons to take this additional instructions   tiZANidine 4 MG tablet Commonly known as: Zanaflex Take 1 tablet (4 mg total) by mouth every 8 (eight) hours as needed for muscle spasms.   valsartan 80 MG tablet Commonly known as: Diovan Take 1 tablet (80 mg total) by mouth daily.   zolpidem 10 MG tablet Commonly known as: AMBIEN Take 1 tablet (10 mg total) by mouth at bedtime as needed. for sleep         ROS:  A comprehensive review of systems was negative except for: Integument/breast: positive for skin lesion(s)  Blood pressure (!) 165/94, pulse 72, temperature 97.6 F (36.4 C), temperature source Other (Comment), resp. rate 16, height 5\' 8"  (1.727 m), weight 202 lb (91.6 kg), SpO2 97 %. Physical Exam Vitals reviewed.  HENT:     Head: Normocephalic.     Nose: Nose normal.     Mouth/Throat:     Mouth: Mucous membranes are moist.   Eyes:     Extraocular Movements: Extraocular movements intact.  Cardiovascular:     Rate and Rhythm: Normal rate and regular rhythm.  Pulmonary:     Effort: Pulmonary effort is normal.     Breath sounds: Normal breath sounds.  Abdominal:     General: There is no distension.     Palpations: Abdomen is soft.     Tenderness: There is no abdominal tenderness.  Musculoskeletal:        General: Normal range of motion.     Cervical back: Normal range of motion.  Skin:    General: Skin is warm.     Comments: 1.) Right flank- small 0.25 cm cherry hemangioma looking lesion 2.) Right upper thigh- small mobile, superficial 1cm likely lipoma under skin 3.) Left upper arm medial- 0.25 hard superficial cyst like lesion 4.) Between breast- skin tag hanging 0.25cm  5.) Right upper chest- mole 0.25 smooth borders but with two colors in the lesion 6.) Upper middle back- small skin colored nodule, looks like skin colored mole 0.25cm, small black head- contents expressed without issues 7.) Right forearm posterior surface, scaly area of patchy skin- possible actinic keratosis versus possible squamous cell lesion, 1cm    Neurological:     General: No focal deficit present.     Mental Status: She is alert and oriented to person, place, and time.  Psychiatric:        Mood and Affect: Mood normal.        Behavior: Behavior normal.        Thought Content: Thought  content normal.     Results: None  Assessment & Plan:  SHARITY FERRANTE is a 72 y.o. female with skin lesions that she is wanting to get excised. Discussed the above lesions.    1.) Right flank- small 0.25 cm cherry hemangioma looking lesion = Would leave alone and monitor   2.) Right upper thigh- small mobile, superficial 1cm likely lipoma under skin= No pain or issues associated with this, would not remove at this time   3.) Left upper arm medial- 0.25 hard superficial cyst like lesion= No pain or issues, would not remove at this time,  monitor   4.) Between breast- skin tag hanging 0.25cm - Can remove when we remove the chest mole  5.) Right upper chest- mole 0.25 smooth borders but with two colors in the lesion= Given the two colors and reports that it is changing, will plan to excise to ensure this is not melanoma   6.) Upper middle back- small skin colored nodule, looks like skin colored mole 0.25cm, small black head- contents expressed without issues= Would not remove the skin colored mole and the black head has been taken care of with expression   7.) Right forearm posterior surface, scaly area of patchy skin- possible actinic keratosis versus possible squamous cell lesion, 1cm  = Would excision due to the scaling and concern this could be a squamous cell   All questions were answered to the satisfaction of the patient.  Discussed risk of bleeding, infection, issues with healing, finding cancer or needing more surgery.   Discussed option of doing this with local at the office but the patient says she is too anxious and cannot do any needles or blood.   Patient request for having sedation/ anesthesia due to her anxiety.   Virl Cagey 10/21/2022, 12:43 PM

## 2022-11-04 ENCOUNTER — Other Ambulatory Visit: Payer: Self-pay

## 2022-11-04 ENCOUNTER — Inpatient Hospital Stay: Payer: Medicare Other | Attending: Hematology | Admitting: Internal Medicine

## 2022-11-04 ENCOUNTER — Telehealth: Payer: Self-pay | Admitting: *Deleted

## 2022-11-04 VITALS — BP 159/83 | HR 76 | Temp 97.7°F | Resp 13 | Wt 205.4 lb

## 2022-11-04 DIAGNOSIS — Z79899 Other long term (current) drug therapy: Secondary | ICD-10-CM | POA: Diagnosis not present

## 2022-11-04 DIAGNOSIS — Z8041 Family history of malignant neoplasm of ovary: Secondary | ICD-10-CM | POA: Diagnosis not present

## 2022-11-04 DIAGNOSIS — M5481 Occipital neuralgia: Secondary | ICD-10-CM | POA: Insufficient documentation

## 2022-11-04 DIAGNOSIS — Z808 Family history of malignant neoplasm of other organs or systems: Secondary | ICD-10-CM | POA: Diagnosis not present

## 2022-11-04 DIAGNOSIS — Z800 Family history of malignant neoplasm of digestive organs: Secondary | ICD-10-CM | POA: Diagnosis not present

## 2022-11-04 DIAGNOSIS — Z801 Family history of malignant neoplasm of trachea, bronchus and lung: Secondary | ICD-10-CM | POA: Diagnosis not present

## 2022-11-04 DIAGNOSIS — Z809 Family history of malignant neoplasm, unspecified: Secondary | ICD-10-CM | POA: Insufficient documentation

## 2022-11-04 DIAGNOSIS — Z803 Family history of malignant neoplasm of breast: Secondary | ICD-10-CM | POA: Diagnosis not present

## 2022-11-04 NOTE — Progress Notes (Signed)
[facility] [place], [place] [address] [phone]   New Patient Evaluation  Date of Service: 11/04/22 Patient Name: Mary Wise Patient MRN: [id number] Patient DOB: 01/25/1951 Provider: Ventura Sellers, MD  Identifying Statement:  Mary Wise is a 72 y.o. female with Bilateral occipital neuralgia - Plan: Ambulatory referral to Neurosurgery who presents for initial consultation and evaluation regarding cancer associated neurologic deficits.    Referring Provider: Leslie Andrea, MD [address] [place],  [place] [address]  Primary Cancer:  Oncologic History: Oncology History Overview Note  Cancer Staging Malignant neoplasm of upper-outer quadrant of left breast in female, estrogen receptor negative ([place]) Staging form: Breast, AJCC 8th Edition - Pathologic stage from 08/19/2017: Stage IIA (pT2, pN0(sn), cM0, G3, ER: Negative, PR: Negative, HER2: Negative) - Signed by Ardath Sax, MD on 09/01/2017     Malignant neoplasm of upper-outer quadrant of left breast in female, estrogen receptor negative  06/16/2017 Initial Diagnosis   Malignant neoplasm of upper-outer quadrant of left breast in female: Initially presented with left deltoid mass which was consistent with lipoma.  During examination, a mass in the left breast was appreciated by palpation.   06/16/2017 Mammogram   BL Diagnostic Study: Irregular hyperdense mass in the upper outer quadrant of the left breast measuring 3.6 cm without radiographically detectable lymphadenopathy.  Normal appearance of the right breast.   06/16/2017 Breast US   3.2 cm irregular mass located at 2:30 o'clock, no pathological lymphadenopathy.   06/23/2017 Pathology Results   Ultrasound-guided biopsy: Positive for invasive ductal carcinoma, grade 3, ER 0%, PR 0%, HER-2 negative by [facility]   07/31/2017 Surgery   Partial left mastectomy with sentinel lymph node biopsy --Pathology:  Invasive ductal carcinoma, measuring 4.5 cm, grade 3.  All margins negative, but the anterior margin less than 0.1 cm.  No surrounding DCIS.  0/5 lymph nodes positive for malignancy   10/08/2017 PET scan   IMPRESSION: 1. No evidence breast cancer metastasis on FDG PET-CT scan. 2. Postsurgical change in the LEFT lateral breast and LEFT axilla.   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 Significance The following genes were evaluated for sequence changes and exonic deletions/duplications: ALK, APC, ATM, AXIN2, BAP1, BARD1, BLM, BMPR1A, BRCA1, BRCA2, BRIP1, CASR, CDC73, CDH1, CDK4, CDKN1B, CDKN1C, CDKN2A (p14ARF), CDKN2A (p16INK4a), CEBPA, CHEK2, CTNNA1, DICER1, DIS3L2, EPCAM*, FH, FLCN, GATA2, GPC3, GREM1*, HRAS, KIT, MAX, MEN1, MET, MLH1, MSH2, MSH3, MSH6, MUTYH, NBN, NF1, NF2, PALB2, PDGFRA, PHOX2B*, PMS2, POLD1, POLE, POT1, PRKAR1A, PTCH1, PTEN, RAD50, RAD51C, RAD51D, RB1, RECQL4, RET, RUNX1, SDHAF2, SDHB, SDHC, SDHD, SMAD4, SMARCA4, SMARCB1, SMARCE1, STK11, SUFU, TERC, TERT, TMEM127, TP53, TSC1, TSC2, VHL, WRN*, WT1 The following genes were evaluated for sequence changes only: EGFR*, HOXB13*, MITF*, NTHL1*, SDHA Results are negative unless otherwise indicated   05/27/2018 - 06/23/2018 Radiation Therapy   Radiation treatment dates:   05/27/2018 - 06/23/2018   Site/dose:    1. Left Breast / 40.05 Gy in 15 fractions 2. Left Breast Boost / 10 Gy in 5 fractions - Total dose of 50.05 Gy   Beams/energy:    1. 3D / 6X Photon 2. Isodose / 10X, 6X Photon   Narrative: The patient tolerated radiation treatment relatively well.  She experienced  fatigue and some expected skin irritation with mild erythema and soreness to  her breast/axilla. She is applying Radiaplex twice daily as directed. She also mentioned bilateral hand cramping with bruising. She was advised to discuss this with her PCP as it is not related to her radiation treatment.    Survivorship   Per Cira Rue, NP      History of Present Illness: The patient's records from the referring physician were obtained and reviewed and the patient interviewed to confirm this HPI.  Mary Wise presents today to review headache syndrome.  She describes severe pain radiating from neck up the back of her head.  Onset was 2 years ago, duration is several hours or a full day.  Frequency is ~3x per week.  She has dosed oxycodone but this has not helped, limiting to less than daily use.  Sleep has been poor "since I was a kid"; she doses ambien but denies daytime sleepiness.  Pain is clearly separate from migraine headaches, which have not been severe or frequent in recent months.     Medications: Current Outpatient Medications on File Prior to Visit  Medication Sig Dispense Refill   albuterol (PROVENTIL) (2.5 MG/3ML) 0.083% nebulizer solution Take 3 mLs (2.5 mg total) by nebulization every 6 (six) hours as needed for wheezing or shortness of breath. 90 mL 3   albuterol (VENTOLIN HFA) 108 (90 Base) MCG/ACT inhaler INHALE 2 PUFFS BY MOUTH EVERY 6 HOURS AS NEEDED FOR WHEEZING FOR SHORTNESS OF BREATH (Patient taking differently: Inhale 2 puffs into the lungs every 6 (six) hours as needed for shortness of breath or wheezing.) 18 g 0   ALPRAZolam (XANAX) 1 MG tablet Take 1 tablet (1 mg total) by mouth 2 (two) times daily as needed. for anxiety (Patient taking differently: Take 1 mg by mouth 2 (two) times daily.) 60 tablet 0   diltiazem (CARDIZEM CD) 180 MG 24 hr capsule Take 1 capsule (180 mg total) by mouth daily. 90 capsule 3   ibuprofen (ADVIL) 800 MG tablet Take 1 tablet (800 mg total) by mouth every 8 (eight) hours as needed. (Patient taking differently: Take 400-800  mg by mouth every 8 (eight) hours as needed for moderate pain.) 30 tablet 3   omeprazole (PRILOSEC) 20 MG capsule Take 1 capsule (20 mg total) by mouth 2 (two) times daily before a meal. 90 capsule 5   ondansetron (ZOFRAN) 4 MG tablet Take 4 mg by mouth every 8 (eight) hours as needed for nausea or vomiting.     Ophthalmic Irrigation Solution (EYE [place] OP) Place 1 application into both eyes 3 (three) times daily as needed (eye irritation).     Oxycodone HCl 10 MG TABS Take 10 mg by mouth as needed.     SUMAtriptan (IMITREX) 100 MG tablet May repeat in 2 hours if headache persists or recurs. (Patient taking differently: Take 100 mg by mouth every 2 (two) hours as needed for migraine (May repeat in 2 hours if headache persists or recurs.).) 9 tablet 0   tiZANidine (ZANAFLEX) 4 MG tablet Take 1 tablet (4 mg total) by mouth every 8 (eight) hours as needed for muscle spasms. 60 tablet 0   valsartan (DIOVAN) 80 MG tablet Take 1 tablet (80 mg total) by mouth daily. 90 tablet 3   zolpidem (AMBIEN) 10 MG tablet Take 1 tablet (10 mg total) by mouth at bedtime as needed. for sleep 30 tablet 0   No current facility-administered medications on file prior to visit.  Allergies:  Allergies  Allergen Reactions   Onion Other (See Comments)    REACTION: results in coma state   Penicillins Hives, Shortness Of Breath and Swelling    Throat swelling Has patient had a PCN reaction causing immediate rash, facial/tongue/throat swelling, SOB or lightheadedness with hypotension: Yes Has patient had a PCN reaction causing severe rash involving mucus membranes or skin necrosis: No Has patient had a PCN reaction that required hospitalization: Yes Has patient had a PCN reaction occurring within the last 10 years: No If all of the above answers are "NO", then may proceed with Cephalosporin use.    Sulfa Antibiotics Hives and Shortness Of Breath   Yellow Dye     REACTION: #5 and #9 "will kill me"   Tape Swelling     Pt states she had tape and Tegaderm used on her in [place]. This caused swelling to her face and neck. Wounds and bleeding from area tape/tegaderm was used.    Clindamycin/Lincomycin Hives and Nausea And Vomiting   Latex Itching   Lipitor [Atorvastatin Calcium] Hives   Past Medical History:  Past Medical History:  Diagnosis Date   Anxiety    Asthma    Cancer ([place])    left breast cancer   Family history of cancer    Genetic testing 02/01/2018   Multi-Cancer panel (83 genes) @ [facility] - No pathogenic mutations detected   GERD (gastroesophageal reflux disease)    Hemorrhoids    History of radiation therapy 05/27/18- 06/23/18   Left Breast 40.05 Gy in 15 fractions, left breast boost 10 Gy in 5 fractions.    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   Past Surgical History:  Past Surgical History:  Procedure Laterality Date   ABDOMINAL HYSTERECTOMY     APPENDECTOMY     BREAST LUMPECTOMY Left 2018   CATARACT EXTRACTION Bilateral    CESAREAN SECTION     CHOLECYSTECTOMY     COLONOSCOPY WITH PROPOFOL N/A 01/15/2021   Procedure: COLONOSCOPY WITH PROPOFOL;  Surgeon: Harvel Quale, MD;  [facility];  Service: Gastroenterology;  Laterality: N/A;  10:00   ERCP     ESOPHAGEAL DILATION N/A 04/10/2021   Procedure: ESOPHAGEAL DILATION;  Surgeon: Rogene Houston, MD;  [facility];  Service: Endoscopy;  Laterality: N/A;   ESOPHAGOGASTRODUODENOSCOPY (EGD) WITH PROPOFOL N/A 04/10/2021   Procedure: ESOPHAGOGASTRODUODENOSCOPY (EGD) WITH PROPOFOL;  Surgeon: Rogene Houston, MD;  [facility];  Service: Endoscopy;  Laterality: N/A;  10:55   IR FLUORO GUIDE PORT INSERTION RIGHT  10/05/2017   IR REMOVAL TUN ACCESS W/ PORT W/O FL MOD SED  05/13/2018   IR US GUIDE VASC ACCESS RIGHT  10/05/2017   LIPOMA EXCISION Left 03/16/2018   left shoulder   LIVER SURGERY     MASS EXCISION  N/A 03/30/2020   Procedure: EXCISION OF SEBACEOUS CYST ON LEFT NECK, SEBACEOUSE CYST ON BACK AND ATYPICAL MOLE ON RIGHT NECK, AND LEFT AXILLA SKIN TAG;  Surgeon: Virl Cagey, MD;  Location: [facility];  Service: General;  Laterality: N/A;   PARTIAL MASTECTOMY WITH AXILLARY SENTINEL LYMPH NODE BIOPSY Left 07/31/2017   Procedure: LEFT PARTIAL MASTECTOMY WITH AXILLARY SENTINEL LYMPH NODE BIOPSY;  Surgeon: Virl Cagey, MD;  Location: [facility];  Service: General;  Laterality: Left;   POLYPECTOMY  01/15/2021   Procedure:  POLYPECTOMY;  Surgeon: Montez Morita, Quillian Quince, MD;  [facility];  Service: Gastroenterology;;   TOTAL KNEE ARTHROPLASTY Left 07/05/2019   Procedure: TOTAL KNEE ARTHROPLASTY;  Surgeon: Renette Butters, MD;  Location: [facility];  Service: Orthopedics;  Laterality: Left;   Social History:  Social History   Socioeconomic History   Marital status: Single    Spouse name: Not on file   Number of children: Not on file   Years of education: Not on file   Highest education level: Not on file  Occupational History   Not on file  Tobacco Use   Smoking status: Never   Smokeless tobacco: Never  Vaping Use   Vaping Use: Never used  Substance and Sexual Activity   Alcohol use: No   Drug use: Never   Sexual activity: Never  Other Topics Concern   Not on file  Social History Narrative   Lives in [place] on [address].    Social Determinants of Health   Financial Resource Strain: Not on file  Food Insecurity: Not on file  Transportation Needs: No Transportation Needs (07/23/2018)   PRAPARE - Hydrologist (Medical): No    Lack of Transportation (Non-Medical): No  Physical Activity: Not on file  Stress: Not on file  Social Connections: Not on file  Intimate Partner Violence: Not At Risk (03/23/2018)   Humiliation, Afraid, Rape, and Kick questionnaire    Fear of Current or Ex-Partner: No    Emotionally Abused: No     Physically Abused: No    Sexually Abused: No   Family History:  Family History  Problem Relation Age of Onset   Cancer Mother 48       unk. primary; deceased 72   Cancer Father 50       brain cancer; deceased 78   Ovarian cancer Sister 61       deceased 55   Diabetes Brother    Breast cancer Maternal Grandmother        dx 66s; deceased 40   Cancer Other        "female ca"   Diabetes Sister    Diabetes Sister    Diabetes Sister    Cancer Paternal Uncle        brain cancer    Cancer Paternal Grandmother        "female ca"   Cancer Paternal Grandfather        lung cancer   Cancer Paternal Uncle        brain tumor    Cancer Paternal Uncle        brain tumor    Stomach cancer Maternal Aunt 87       deceased 21   Cancer Paternal Aunt        ink. primary in 2 aunts   Melanoma Other    Lung cancer Maternal Aunt    Stomach cancer Cousin        2 sons of an unaffected maternal uncle    Review of Systems: Constitutional: Doesn't report fevers, chills or abnormal weight loss Eyes: Doesn't report blurriness of vision Ears, nose, mouth, throat, and face: Doesn't report sore throat Respiratory: Doesn't report cough, dyspnea or wheezes Cardiovascular: Doesn't report palpitation, chest discomfort  Gastrointestinal:  Doesn't report nausea, constipation, diarrhea GU: Doesn't report incontinence Skin: Doesn't report skin rashes Neurological: Per HPI Musculoskeletal: Doesn't report joint pain Behavioral/Psych: Doesn't report anxiety  Physical Exam: Vitals:   11/04/22 1202  BP: Marland Kitchen)  159/83  Pulse: 76  Resp: 13  Temp: 97.7 F (36.5 C)  SpO2: 98%   KPS: 80. General: Alert, cooperative, pleasant, in no acute distress Head: Normal EENT: No conjunctival injection or scleral icterus.  Lungs: Resp effort normal Cardiac: Regular rate Abdomen: Non-distended abdomen Skin: No rashes cyanosis or petechiae. Extremities: No clubbing or edema  Neurologic Exam: Mental Status:  Awake, alert, attentive to examiner. Oriented to self and environment. Language is fluent with intact comprehension.  Cranial Nerves: Visual acuity is grossly normal. Visual fields are full. Extra-ocular movements intact. No ptosis. Face is symmetric Motor: Tone and bulk are normal. Power is full in both arms and legs. Reflexes are symmetric, no pathologic reflexes present.  Sensory: Intact to light touch Gait: Normal.   Labs: I have reviewed the data as listed    Component Value Date/Time   NA 139 08/25/2022 1107   K 3.8 08/25/2022 1107   CL 104 08/25/2022 1107   CO2 30 08/25/2022 1107   GLUCOSE 83 08/25/2022 1107   BUN 12 08/25/2022 1107   CREATININE 0.83 08/25/2022 1107   CREATININE 0.73 09/10/2020 1428   CALCIUM 9.3 08/25/2022 1107   PROT 7.1 08/25/2022 1107   ALBUMIN 4.0 08/25/2022 1107   AST 21 08/25/2022 1107   ALT 20 08/25/2022 1107   ALKPHOS 92 08/25/2022 1107   BILITOT 0.4 08/25/2022 1107   GFRNONAA >60 08/25/2022 1107   GFRNONAA 84 09/10/2020 1428   GFRAA 97 09/10/2020 1428   Lab Results  Component Value Date   WBC 5.6 08/25/2022   NEUTROABS 2.9 08/25/2022   HGB 13.5 08/25/2022   HCT 40.3 08/25/2022   MCV 88.0 08/25/2022   PLT 298 08/25/2022     Assessment/Plan Bilateral occipital neuralgia - Plan: Ambulatory referral to [facility] presents with clinical syndrome most c/w occipital neuralgia.    We discussed and recommended greater occipital nerve block, she is agreeable.  Referral will be placed for Dr. Davy Pique at [facility].    Patient is not interested in oral headache prevention medications at this time.  If not improved with the procedure, she will return to clinic so headache prevention medications can be revisited.    We spent twenty additional minutes teaching regarding the natural history, biology, and historical experience in the treatment of neurologic complications of cancer.   We appreciate the opportunity to  participate in the care of Mary Wise.   All questions were answered. The patient knows to call the clinic with any problems, questions or concerns. No barriers to learning were detected.  The total time spent in the encounter was 40 minutes and more than 50% was on counseling and review of test results   Ventura Sellers, MD Medical Director of Neuro-Oncology [facility] [place] 11/04/22 2:36 PM

## 2022-11-04 NOTE — Telephone Encounter (Signed)
Referral routed to [facility] Neurosurgery, Dr Davy Pique, per Dr Renda Rolls request to evaluate patient for possible occipital nerve block.  Copy of demographic sheet, referral & today's MD note routed.

## 2022-11-12 ENCOUNTER — Encounter (HOSPITAL_COMMUNITY): Payer: Self-pay

## 2022-11-12 ENCOUNTER — Encounter (HOSPITAL_COMMUNITY)
Admission: RE | Admit: 2022-11-12 | Discharge: 2022-11-12 | Disposition: A | Discharge status: Home / Self Care | Payer: Medicare Other | Source: Ambulatory Visit | Attending: General Surgery | Admitting: General Surgery

## 2022-11-12 ENCOUNTER — Other Ambulatory Visit: Payer: Self-pay

## 2022-11-14 ENCOUNTER — Ambulatory Visit (HOSPITAL_BASED_OUTPATIENT_CLINIC_OR_DEPARTMENT_OTHER): Payer: Medicare Other | Admitting: Certified Registered Nurse Anesthetist

## 2022-11-14 ENCOUNTER — Ambulatory Visit (HOSPITAL_COMMUNITY)
Admission: RE | Admit: 2022-11-14 | Discharge: 2022-11-14 | Disposition: A | Discharge status: Home / Self Care | Payer: Medicare Other | Source: Ambulatory Visit | Attending: General Surgery | Admitting: General Surgery

## 2022-11-14 ENCOUNTER — Ambulatory Visit (HOSPITAL_COMMUNITY): Payer: Medicare Other | Admitting: Certified Registered Nurse Anesthetist

## 2022-11-14 ENCOUNTER — Encounter (HOSPITAL_COMMUNITY)
Admission: RE | Disposition: A | Discharge status: Home / Self Care | Payer: Self-pay | Source: Ambulatory Visit | Attending: General Surgery

## 2022-11-14 DIAGNOSIS — D487 Neoplasm of uncertain behavior of other specified sites: Secondary | ICD-10-CM | POA: Diagnosis present

## 2022-11-14 DIAGNOSIS — L918 Other hypertrophic disorders of the skin: Secondary | ICD-10-CM

## 2022-11-14 DIAGNOSIS — F419 Anxiety disorder, unspecified: Secondary | ICD-10-CM | POA: Diagnosis not present

## 2022-11-14 DIAGNOSIS — D225 Melanocytic nevi of trunk: Secondary | ICD-10-CM

## 2022-11-14 DIAGNOSIS — D1801 Hemangioma of skin and subcutaneous tissue: Secondary | ICD-10-CM | POA: Diagnosis not present

## 2022-11-14 DIAGNOSIS — R519 Headache, unspecified: Secondary | ICD-10-CM | POA: Diagnosis not present

## 2022-11-14 DIAGNOSIS — D4989 Neoplasm of unspecified behavior of other specified sites: Secondary | ICD-10-CM | POA: Diagnosis not present

## 2022-11-14 DIAGNOSIS — L821 Other seborrheic keratosis: Secondary | ICD-10-CM | POA: Insufficient documentation

## 2022-11-14 DIAGNOSIS — L280 Lichen simplex chronicus: Secondary | ICD-10-CM | POA: Diagnosis not present

## 2022-11-14 DIAGNOSIS — K219 Gastro-esophageal reflux disease without esophagitis: Secondary | ICD-10-CM | POA: Insufficient documentation

## 2022-11-14 DIAGNOSIS — D492 Neoplasm of unspecified behavior of bone, soft tissue, and skin: Secondary | ICD-10-CM | POA: Diagnosis present

## 2022-11-14 DIAGNOSIS — I10 Essential (primary) hypertension: Secondary | ICD-10-CM | POA: Diagnosis not present

## 2022-11-14 DIAGNOSIS — L989 Disorder of the skin and subcutaneous tissue, unspecified: Secondary | ICD-10-CM

## 2022-11-14 HISTORY — PX: MASS EXCISION: SHX2000

## 2022-11-14 HISTORY — PX: MOLE REMOVAL: SHX2046

## 2022-11-14 HISTORY — PX: EXCISION OF SKIN TAG: SHX6270

## 2022-11-14 SURGERY — EXCISION MASS
Anesthesia: General | Site: Chest | Laterality: Right

## 2022-11-14 MED ORDER — LIDOCAINE 2% (20 MG/ML) 5 ML SYRINGE
INTRAMUSCULAR | Status: DC | PRN
  Administered 2022-11-14: 80 mg via INTRAVENOUS

## 2022-11-14 MED ORDER — DIPHENHYDRAMINE HCL 50 MG/ML IJ SOLN
INTRAMUSCULAR | Status: AC
  Filled 2022-11-14: qty 1

## 2022-11-14 MED ORDER — ONDANSETRON HCL 4 MG PO TABS: 4.0000 mg | ORAL_TABLET | Freq: Three times a day (TID) | ORAL | 0 refills | Status: DC | PRN

## 2022-11-14 MED ORDER — PROPOFOL 10 MG/ML IV BOLUS
INTRAVENOUS | Status: AC
  Filled 2022-11-14: qty 20

## 2022-11-14 MED ORDER — LACTATED RINGERS IV SOLN: INTRAVENOUS | Status: DC

## 2022-11-14 MED ORDER — 0.9 % SODIUM CHLORIDE (POUR BTL) OPTIME
TOPICAL | Status: DC | PRN
  Administered 2022-11-14: 1000 mL

## 2022-11-14 MED ORDER — MIDAZOLAM HCL 5 MG/5ML IJ SOLN
INTRAMUSCULAR | Status: DC | PRN
  Administered 2022-11-14 (×2): 1 mg via INTRAVENOUS

## 2022-11-14 MED ORDER — ONDANSETRON HCL 4 MG/2ML IJ SOLN
INTRAMUSCULAR | Status: AC
  Filled 2022-11-14: qty 2

## 2022-11-14 MED ORDER — CHLORHEXIDINE GLUCONATE CLOTH 2 % EX PADS: 6.0000 | MEDICATED_PAD | Freq: Once | CUTANEOUS | Status: DC

## 2022-11-14 MED ORDER — SCOPOLAMINE 1 MG/3DAYS TD PT72
1.0000 | MEDICATED_PATCH | Freq: Once | TRANSDERMAL | Status: DC
  Administered 2022-11-14: 1.5 mg via TRANSDERMAL
  Filled 2022-11-14: qty 1

## 2022-11-14 MED ORDER — ONDANSETRON HCL 4 MG/2ML IJ SOLN
INTRAMUSCULAR | Status: DC | PRN
  Administered 2022-11-14: 4 mg via INTRAVENOUS

## 2022-11-14 MED ORDER — BUPIVACAINE HCL (PF) 0.5 % IJ SOLN
INTRAMUSCULAR | Status: DC | PRN
  Administered 2022-11-14: 10 mL

## 2022-11-14 MED ORDER — PROPOFOL 10 MG/ML IV BOLUS
INTRAVENOUS | Status: DC | PRN
  Administered 2022-11-14: 160 mg via INTRAVENOUS

## 2022-11-14 MED ORDER — DEXAMETHASONE SODIUM PHOSPHATE 10 MG/ML IJ SOLN
INTRAMUSCULAR | Status: DC | PRN
  Administered 2022-11-14: 4 mg via INTRAVENOUS

## 2022-11-14 MED ORDER — OXYCODONE HCL 5 MG PO TABS: 5.0000 mg | ORAL_TABLET | ORAL | 0 refills | Status: DC | PRN

## 2022-11-14 MED ORDER — EPHEDRINE 5 MG/ML INJ
INTRAVENOUS | Status: AC
  Filled 2022-11-14: qty 5

## 2022-11-14 MED ORDER — FENTANYL CITRATE (PF) 100 MCG/2ML IJ SOLN
INTRAMUSCULAR | Status: AC
  Filled 2022-11-14: qty 2

## 2022-11-14 MED ORDER — EPHEDRINE SULFATE-NACL 50-0.9 MG/10ML-% IV SOSY
PREFILLED_SYRINGE | INTRAVENOUS | Status: DC | PRN
  Administered 2022-11-14 (×2): 5 mg via INTRAVENOUS

## 2022-11-14 MED ORDER — FENTANYL CITRATE (PF) 100 MCG/2ML IJ SOLN
INTRAMUSCULAR | Status: DC | PRN
  Administered 2022-11-14: 50 ug via INTRAVENOUS
  Administered 2022-11-14 (×2): 25 ug via INTRAVENOUS

## 2022-11-14 MED ORDER — MIDAZOLAM HCL 2 MG/2ML IJ SOLN
INTRAMUSCULAR | Status: AC
  Filled 2022-11-14: qty 2

## 2022-11-14 SURGICAL SUPPLY — 29 items
ADH SKN CLS APL DERMABOND .7 (GAUZE/BANDAGES/DRESSINGS) ×3
APL PRP STRL LF DISP 70% ISPRP (MISCELLANEOUS) ×3
APL PRP STRL LF ISPRP CHG 10.5 (MISCELLANEOUS) ×3
APPLICATOR CHLORAPREP 10.5 ORG (MISCELLANEOUS) ×4 IMPLANT
CHLORAPREP W/TINT 26 (MISCELLANEOUS) IMPLANT
CLOTH BEACON ORANGE TIMEOUT ST (SAFETY) ×4 IMPLANT
COVER LIGHT HANDLE (MISCELLANEOUS) IMPLANT
DERMABOND ADVANCED .7 DNX12 (GAUZE/BANDAGES/DRESSINGS) IMPLANT
DRAPE UTILITY W/TAPE 26X15 (DRAPES) IMPLANT
ELECT NDL TIP 2.8 STRL (NEEDLE) IMPLANT
ELECT NEEDLE TIP 2.8 STRL (NEEDLE) ×3 IMPLANT
ELECT REM PT RETURN 9FT ADLT (ELECTROSURGICAL) ×3
ELECTRODE REM PT RTRN 9FT ADLT (ELECTROSURGICAL) ×4 IMPLANT
GLOVE BIO SURGEON STRL SZ 6.5 (GLOVE) ×4 IMPLANT
GLOVE BIOGEL PI IND STRL 6.5 (GLOVE) ×4 IMPLANT
GLOVE BIOGEL PI IND STRL 7.0 (GLOVE) ×8 IMPLANT
GOWN STRL REUS W/TWL LRG LVL3 (GOWN DISPOSABLE) ×8 IMPLANT
KIT TURNOVER KIT A (KITS) ×4 IMPLANT
MANIFOLD NEPTUNE II (INSTRUMENTS) ×4 IMPLANT
NDL HYPO 25X1 1.5 SAFETY (NEEDLE) ×4 IMPLANT
NEEDLE HYPO 25X1 1.5 SAFETY (NEEDLE) ×3 IMPLANT
NS IRRIG 1000ML POUR BTL (IV SOLUTION) ×4 IMPLANT
PACK MINOR (CUSTOM PROCEDURE TRAY) IMPLANT
PAD ARMBOARD 7.5X6 YLW CONV (MISCELLANEOUS) ×4 IMPLANT
SET BASIN LINEN APH (SET/KITS/TRAYS/PACK) ×4 IMPLANT
SUT MNCRL AB 4-0 PS2 18 (SUTURE) ×4 IMPLANT
SUT VIC AB 3-0 SH 27 (SUTURE) ×3
SUT VIC AB 3-0 SH 27X BRD (SUTURE) IMPLANT
SYR CONTROL 10ML LL (SYRINGE) ×4 IMPLANT

## 2022-11-14 NOTE — Interval H&P Note (Signed)
History and Physical Interval Note:  11/14/2022 8:51 AM  Mary Wise  has presented today for surgery, with the diagnosis of MOLE, CENTER CHEST NEOPLASM RIGHT FOREARM 1 CM.  The various methods of treatment have been discussed with the patient and family. After consideration of risks, benefits and other options for treatment, the patient has consented to  Procedure(s): EXCISION MASS, NEOPLASM FOREARM (Right) MOLE REMOVAL, CENTER CHEST (N/A) as a surgical intervention.  The patient's history has been reviewed, patient examined, no change in status, stable for surgery.  I have reviewed the patient's chart and labs.  Questions were answered to the patient's satisfaction.    Marked areas on chest and right forearm.   Lucretia Roers

## 2022-11-14 NOTE — Op Note (Addendum)
[facility] Operative Note  11/14/22  Preoperative Diagnosis:  Central chest skin tag, left chest mole, right forearm neoplasm (1cm)    Postoperative Diagnosis: Same   Procedure(s) Performed: Excision of skin tag, excision of left chest mole (0.25 size lesion, 0.75cm excision), excision of right forearm lesion 1cm    Surgeon: Leatrice Jewels. Henreitta Leber, MD   Assistants: No qualified resident was available    Anesthesia: General endotracheal   Anesthesiologist: Windell Norfolk, MD    Specimens:  Skin tag, Left chest mole, Right forearm skin lesion    Estimated Blood Loss: Minimal   Blood Replacement: None    Complications: None   Wound Class: Clean   Operative Indications: Ms. Sylvis has numerous skin lesions she came to me about and ask to have them removed. We look at the various lesions and determined that the left chest mole did have multiple colors and an irregular border, the skin tag in her central chest bled when rubbed, and her right forearm scaly skin lesion had concern for squamous cell.   Findings: Central chest skin tag, left chest mole, right forearm neoplasm (1cm)    Procedure: The patient was taken to the operating room and placed supine. General endotracheal anesthesia was induced. Intravenous antibiotics were not indicated.  The chest and right forearm were prepped and draped in the usual sterile fashion.   An ellipse was made around the skin tag, which could be more of a pedunculated nevus, full thickness skin was taken under the tag. Hemostasis was achieved. Lidocaine was injected. The skin was closed with a subcuticular 4-0 Monocryl.   An ellipse was made around the left chest mole with 0.25 borders around the lesion. A full thickness excision was done. Hemostasis was achieved. Lidocaine was injected. The skin was closed with a subcuticular 4-0 Monocryl.   Attention was then turned to the scaly forearm lesion. An ellipse was made around the lesion  with margins. A full thickness excision was done. Hemostasis was achieved. Lidocaine was injected. Flaps were created. 3-0 Vicryl were used to close the flaps in interrupted fashion. The skin was closed with a subcuticular 4-0 Monocryl.   The the incisions were covered with dermabond.  Final inspection revealed acceptable hemostasis. All counts were correct at the end of the case. The patient was awakened from anesthesia and extubated without complication.  The patient went to the PACU in stable condition.   Algis Greenhouse, MD [facility] [address] [place] [place] [place], [place] [address] [phone] (office)

## 2022-11-14 NOTE — Anesthesia Postprocedure Evaluation (Signed)
Anesthesia Post Note  Patient: Mary Wise  Procedure(s) Performed: EXCISION MASS, NEOPLASM FOREARM (Right: Arm Lower) MOLE REMOVAL Left CHEST (Left: Chest) EXCISION OF SKIN TAG (Chest)  Patient location during evaluation: Phase II Anesthesia Type: General Level of consciousness: awake Pain management: pain level controlled Vital Signs Assessment: post-procedure vital signs reviewed and stable Respiratory status: spontaneous breathing and respiratory function stable Cardiovascular status: blood pressure returned to baseline and stable Postop Assessment: no headache and no apparent nausea or vomiting Anesthetic complications: no Comments: Late entry   No notable events documented.   Last Vitals:  Vitals:   11/14/22 1015 11/14/22 1035  BP: (!) 100/58 113/69  Pulse: 72 74  Resp: 15   Temp:  36.4 C  SpO2: 95% 94%    Last Pain:  Vitals:   11/14/22 1035  TempSrc: Oral  PainSc: 0-No pain                 Windell Norfolk

## 2022-11-14 NOTE — Anesthesia Preprocedure Evaluation (Signed)
Anesthesia Evaluation  Patient identified by MRN, date of birth, ID band Patient awake    Reviewed: Allergy & Precautions, H&P , NPO status , Patient's Chart, lab work & pertinent test results, reviewed documented beta blocker date and time   Airway Mallampati: II  TM Distance: >3 FB Neck ROM: full    Dental no notable dental hx.    Pulmonary neg pulmonary ROS, asthma    Pulmonary exam normal breath sounds clear to auscultation       Cardiovascular Exercise Tolerance: Good hypertension, negative cardio ROS  Rhythm:regular Rate:Normal     Neuro/Psych  Headaches, Seizures -,   Anxiety      Neuromuscular disease negative neurological ROS  negative psych ROS   GI/Hepatic negative GI ROS, Neg liver ROS,GERD  ,,  Endo/Other  negative endocrine ROS    Renal/GU negative Renal ROS  negative genitourinary   Musculoskeletal   Abdominal   Peds  Hematology negative hematology ROS (+)   Anesthesia Other Findings   Reproductive/Obstetrics negative OB ROS                             Anesthesia Physical Anesthesia Plan  ASA: 2  Anesthesia Plan: General and General LMA   Post-op Pain Management:    Induction:   PONV Risk Score and Plan: Ondansetron  Airway Management Planned:   Additional Equipment:   Intra-op Plan:   Post-operative Plan:   Informed Consent: I have reviewed the patients History and Physical, chart, labs and discussed the procedure including the risks, benefits and alternatives for the proposed anesthesia with the patient or authorized representative who has indicated his/her understanding and acceptance.     Dental Advisory Given  Plan Discussed with: CRNA  Anesthesia Plan Comments:        Anesthesia Quick Evaluation

## 2022-11-14 NOTE — Anesthesia Procedure Notes (Signed)
Procedure Name: LMA Insertion Date/Time: 11/14/2022 9:13 AM  Performed by: Adria Dill, CRNAPre-anesthesia Checklist: Patient identified, Emergency Drugs available, Suction available and Patient being monitored Patient Re-evaluated:Patient Re-evaluated prior to induction Oxygen Delivery Method: Circle system utilized Preoxygenation: Pre-oxygenation with 100% oxygen Induction Type: IV induction Ventilation: Mask ventilation without difficulty LMA: LMA inserted LMA Size: 4.0 Number of attempts: 1 Placement Confirmation: positive ETCO2 and breath sounds checked- equal and bilateral Tube secured with: Tape Dental Injury: Teeth and Oropharynx as per pre-operative assessment

## 2022-11-14 NOTE — Transfer of Care (Signed)
Immediate Anesthesia Transfer of Care Note  Patient: Mary Wise  Procedure(s) Performed: EXCISION MASS, NEOPLASM FOREARM (Right: Arm Lower) MOLE REMOVAL Left CHEST (Chest) EXCISION OF SKIN TAG (Chest)  Patient Location: PACU  Anesthesia Type:General  Level of Consciousness: drowsy and patient cooperative  Airway & Oxygen Therapy: Patient Spontanous Breathing  Post-op Assessment: Report given to RN and Post -op Vital signs reviewed and stable  Post vital signs: Reviewed and stable  Last Vitals:  Vitals Value Taken Time  BP 104/57 11/14/22 0958  Temp    Pulse 80 11/14/22 1000  Resp 15 11/14/22 1000  SpO2 90 % 11/14/22 1000  Vitals shown include unvalidated device data.  Last Pain:  Vitals:   11/14/22 0857  TempSrc: Oral  PainSc: 3          Complications: No notable events documented.

## 2022-11-14 NOTE — Progress Notes (Signed)
[facility]  Updated friend. Rx sent to [facility]. Will call with results and all to check on patient unless she is having an issue.  Algis Greenhouse, MD [facility] [address] [place], [place] [address] [phone] (office)

## 2022-11-14 NOTE — Discharge Instructions (Signed)
Discharge Instructions:  Common Complaints: Pain at the incision site is common.  Some nausea is common and poor appetite after anesthesia. The main goal is to stay hydrated the first few days after surgery.   Diet/ Activity: Diet as tolerated. You may not have an appetite, but it is important to stay hydrated. Drink 64 ounces of water a day. Your appetite will return with time.  Shower per your regular routine daily.  Do not take hot showers. Take warm showers that are less than 10 minutes. Walk everyday for at least 15-20 minutes. Deep cough and move around every 1-2 hours in the first few days after surgery.  Limit excessive movement, lifting > 10 lbs, stretching with the limb if there is an incision on your arm/armpit or leg.   Limit stretching, pulling on your incision if it is located on other parts of your body.  Do not pick at the dermabond glue on your incision sites. This glue film will remain in place for 1-2 weeks and will start to peel off.  Do not place lotions or balms on your incision unless instructed to specifically by Dr. Henreitta Leber.   Medication: Take tylenol and ibuprofen as needed for pain control, alternating every 4-6 hours.  Example:  Tylenol 1000mg  @ 6am, 12noon, 6pm, (Do not exceed 4000mg  of tylenol a day). Ibuprofen 800mg  @ 9am, 3pm, 9pm, 3am (Do not exceed 3600mg  of ibuprofen a day).  Take Roxicodone for breakthrough pain every 4-6 hours.  Take Colace for constipation related to narcotic pain medication. If you do not have a bowel movement in 2 days, take Miralax over the counter.  Drink plenty of water to also prevent constipation.   Contact Information: If you have questions or concerns, please call our office, [phone], Monday- Thursday 8AM-5PM and Friday 8AM-12Noon.  If it is after hours or on the weekend, please call [facility] Main [facility], [phone], [phone], and ask to speak to the surgeon on call for Dr. Henreitta Leber at [facility].

## 2022-11-20 ENCOUNTER — Encounter (HOSPITAL_COMMUNITY): Payer: Self-pay | Admitting: General Surgery

## 2022-11-20 LAB — SURGICAL PATHOLOGY

## 2022-11-21 NOTE — Progress Notes (Signed)
Please let her know everything was benign. All were seborrheic keratosis type lesions.

## 2022-11-27 ENCOUNTER — Ambulatory Visit (INDEPENDENT_AMBULATORY_CARE_PROVIDER_SITE_OTHER): Payer: Medicare Other | Admitting: General Surgery

## 2022-11-27 DIAGNOSIS — D492 Neoplasm of unspecified behavior of bone, soft tissue, and skin: Secondary | ICD-10-CM

## 2022-11-27 DIAGNOSIS — L989 Disorder of the skin and subcutaneous tissue, unspecified: Secondary | ICD-10-CM

## 2022-11-27 NOTE — Progress Notes (Signed)
[facility]  [facility]  I am calling the patient for post operative evaluation. This is not a billable encounter as it is under the global charges for the surgery.  The patient had a excision of an arm and chest lesion on 4/12. The patient reports that she is doing well.  The incisions are healing but there is some swelling. She had minor drainage but that is improving. The patient has no concerns. Can put neosporin on them if she wants.   Pathology:  FINAL MICROSCOPIC DIAGNOSIS:   A. SKIN TAG, CENTRAL CHEST, EXCISION:  - Irritated seborrheic keratosis  - Negative for malignancy  - See comment   B. MOLE, UPPER LEFT CHEST, EXCISION:  - Superficial hemangioma with thrombosis and seborrheic keratosis  - Negative for malignancy  - See comment   C. 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 Dalia Heading reviewed the case and agrees with the above  diagnoses.  Part C: Multiple deeper levels of blocks C1 and 2 are examined.  Drs.  Depcik-Gloor and Maryelizabeth Kaufmann reviewed the case and agree with the above  diagnosis.   Algis Greenhouse, MD [facility] [address] [place], [place] [address] [phone] (office)

## 2022-12-22 ENCOUNTER — Ambulatory Visit
Admission: RE | Admit: 2022-12-22 | Discharge: 2022-12-22 | Disposition: A | Discharge status: Home / Self Care | Payer: Medicare Other | Source: Ambulatory Visit | Attending: Hematology | Admitting: Hematology

## 2022-12-22 DIAGNOSIS — Z1231 Encounter for screening mammogram for malignant neoplasm of breast: Secondary | ICD-10-CM

## 2023-01-01 NOTE — Progress Notes (Signed)
Office Visit Note  Patient: Mary Wise             Date of Birth: [birth year]           MRN: [id number]             PCP: John Giovanni, MD Referring: John Giovanni, MD Visit Date: 01/15/2023 Occupation: @GUAROCC @  Subjective:  Pain in multiple joints and muscles  History of Present Illness: Mary Wise is a 72 y.o. female seen in consultation per request of her PCP.  According the patient her symptoms started when she was 72 years old with pain and discomfort in all of her joints and muscles.  She also had a scoliosis and she contributed a lot of symptoms to it.  She states that she was diagnosed with fibromyalgia syndrome in the 70s but did not follow-up with any doctors that she does not like taking medications.  She has suffered with pain in her joints and muscles for many years including cervical spine, thoracic and lumbar spine, shoulders, elbows, wrist joints, hands, hips, knees, ankles and feet.  She states all her joints swell.  She had left total knee replacement by Dr. Eulah Pont in 2020 and had good response to replacement surgery.  She has morning stiffness and nocturnal pain.  She was seen by neurosurgeon yesterday who advised injections in her neck but she declined.She gives history of fatigue and  dry mouth.  There is no history of malar rash, photosensitivity, Raynaud's or lymphadenopathy.  She states she gets pain medications from her cardiologist.  There is family history of lupus in 2 of her cousins.  There is positive history of osteoarthritis and several family members.  She is gravida 2, para 1, 1 tubal pregnancy.  There is no history of preeclampsia DVTs.    Activities of Daily Living:  Patient reports morning stiffness for 1 hour.   Patient Reports nocturnal pain.  Difficulty dressing/grooming: Reports Difficulty climbing stairs: Reports Difficulty getting out of chair: Reports Difficulty using hands for taps, buttons, cutlery, and/or writing:  Reports  Review of Systems  Constitutional:  Positive for fatigue.  HENT:  Positive for sore throat, mouth dryness and sore tongue. Negative for mouth sores.   Eyes:  Negative for dryness.  Respiratory:  Positive for shortness of breath.   Cardiovascular:  Positive for chest pain and palpitations.  Gastrointestinal:  Positive for constipation. Negative for blood in stool and diarrhea.  Endocrine: Positive for increased urination.  Genitourinary:  Negative for involuntary urination.  Musculoskeletal:  Positive for joint pain, gait problem, joint pain, joint swelling, myalgias, muscle weakness, morning stiffness, muscle tenderness and myalgias.  Skin:  Negative for color change, rash, hair loss and sensitivity to sunlight.  Allergic/Immunologic: Positive for susceptible to infections.  Neurological:  Positive for headaches. Negative for dizziness.  Hematological:  Negative for swollen glands.  Psychiatric/Behavioral:  Positive for depressed mood and sleep disturbance. The patient is nervous/anxious.     PMFS History:  Patient Active Problem List   Diagnosis Date Noted   Bilateral occipital neuralgia 11/04/2022   Skin lesion of right arm 10/23/2022   Neoplasm of skin of chest 10/23/2022   Neoplasm of uncertain behavior of forearm 10/23/2022   GERD (gastroesophageal reflux disease) 03/26/2021   Abdominal pain 03/26/2021   Esophageal dysphagia 03/26/2021   Wound dehiscence 04/12/2020   Atypical mole of neck 03/06/2020   Sebaceous cyst 02/16/2020   Skin tag 02/16/2020   Primary osteoarthritis of left knee 06/13/2019  OA (osteoarthritis) of knee 05/18/2019   Asthma 05/18/2019   Hyperlipemia 05/18/2019   Osteopenia 02/02/2019   Muscle cramps 01/10/2019   Peripheral neuropathy 10/11/2018   Migraines 02/01/2018   Insomnia 02/01/2018   Genetic testing 02/01/2018   Family history of cancer    Port-A-Cath in place 10/12/2017   Malignant neoplasm of upper-outer quadrant of left breast  in female, estrogen receptor negative (HCC)    HTN, goal below 140/90 07/01/2017   Anxiety 07/01/2017   Lipoma of left shoulder 06/19/2017   Left breast mass 06/19/2017    Past Medical History:  Diagnosis Date   Anxiety    Asthma    Cancer (HCC)    left breast cancer   Family history of cancer    Genetic testing 02/01/2018   Multi-Cancer panel (83 genes) @ [facility] - No pathogenic mutations detected   GERD (gastroesophageal reflux disease)    Hemorrhoids    History of radiation therapy 05/27/18- 06/23/18   Left Breast 40.05 Gy in 15 fractions, left breast boost 10 Gy in 5 fractions.    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 primary; deceased 63   Breast cancer Mother    Melanoma Mother    Cancer Father 53       brain cancer; deceased 90   Ovarian cancer Sister 36       deceased 89   Pancreatic cancer Sister    Diabetes Sister    Vision loss Sister    Diabetes Sister    Diabetes Sister    Diabetes Brother    Prostate cancer Brother    Stomach cancer Maternal Aunt 70       deceased 32   Lung cancer Maternal Aunt    Cancer Paternal Aunt        ink. primary in 2 aunts   Cancer Paternal Uncle        brain cancer    Cancer Paternal Uncle        brain tumor    Cancer Paternal Uncle        brain tumor    Breast cancer Maternal Grandmother        dx 74s; deceased 74   Cancer Paternal Grandmother        "female ca"   Cancer Paternal Grandfather        lung cancer   Stomach cancer Cousin        2 sons of an unaffected maternal uncle   Cancer Other        "female ca"   Melanoma Other    Scoliosis Daughter    Past Surgical History:  Procedure Laterality Date   ABDOMINAL HYSTERECTOMY     benign spine tumor  2022   BREAST LUMPECTOMY Left 2018   CATARACT EXTRACTION Bilateral    CESAREAN SECTION      CHOLECYSTECTOMY     COLONOSCOPY WITH PROPOFOL N/A 01/15/2021   Procedure: COLONOSCOPY WITH PROPOFOL;  Surgeon: Dolores Frame, MD;  [facility]: [facility];  Service: Gastroenterology;  Laterality: N/A;  10:00   ERCP     ESOPHAGEAL DILATION N/A 04/10/2021   Procedure: ESOPHAGEAL DILATION;  Surgeon: Malissa Hippo, MD;  [facility]: [facility];  Service: Endoscopy;  Laterality:  N/A;   ESOPHAGOGASTRODUODENOSCOPY (EGD) WITH PROPOFOL N/A 04/10/2021   Procedure: ESOPHAGOGASTRODUODENOSCOPY (EGD) WITH PROPOFOL;  Surgeon: Malissa Hippo, MD;  [facility]: [facility];  Service: Endoscopy;  Laterality: N/A;  10:55   EXCISION OF SKIN TAG N/A 11/14/2022   Procedure: EXCISION OF SKIN TAG;  Surgeon: Lucretia Roers, MD;  Location: [facility];  Service: General;  Laterality: N/A;   IR FLUORO GUIDE PORT INSERTION RIGHT  10/05/2017   IR REMOVAL TUN ACCESS W/ PORT W/O FL MOD SED  05/13/2018   IR US GUIDE VASC ACCESS RIGHT  10/05/2017   LIPOMA EXCISION Left 03/16/2018   left shoulder   LIVER SURGERY     MASS EXCISION N/A 03/30/2020   Procedure: EXCISION OF SEBACEOUS CYST ON LEFT NECK, SEBACEOUSE CYST ON BACK AND ATYPICAL MOLE ON RIGHT NECK, AND LEFT AXILLA SKIN TAG;  Surgeon: Lucretia Roers, MD;  Location: [facility];  Service: General;  Laterality: N/A;   MASS EXCISION Right 11/14/2022   Procedure: EXCISION MASS, NEOPLASM FOREARM;  Surgeon: Lucretia Roers, MD;  Location: [facility];  Service: General;  Laterality: Right;   MOLE REMOVAL Left 11/14/2022   Procedure: MOLE REMOVAL Left CHEST;  Surgeon: Lucretia Roers, MD;  Location: [facility];  Service: General;  Laterality: Left;   PARTIAL MASTECTOMY WITH AXILLARY SENTINEL LYMPH NODE BIOPSY Left 07/31/2017   Procedure: LEFT PARTIAL MASTECTOMY WITH AXILLARY SENTINEL LYMPH NODE BIOPSY;  Surgeon: Lucretia Roers, MD;  Location: [facility];  Service: General;  Laterality: Left;   POLYPECTOMY  01/15/2021   Procedure: POLYPECTOMY;  Surgeon: Dolores Frame, MD;  [facility];  Service: Gastroenterology;;   TOTAL KNEE ARTHROPLASTY Left 07/05/2019   Procedure: TOTAL KNEE ARTHROPLASTY;  Surgeon: Sheral Apley, MD;  Location: [facility];  Service: Orthopedics;  Laterality: Left;   Social History   Social History Narrative   Lives in [facility]isted Living on [address].    Immunization History  Administered Date(s) Administered   Moderna Sars-Covid-2 Vaccination 12/07/2019, 01/04/2020   Pneumococcal Conjugate-13 05/29/2020   Tdap 03/21/1998, 11/02/2013     Objective: Vital Signs: BP (!) 168/94 (BP Location: Right Arm, Patient Position: Sitting, Cuff Size: Normal)   Pulse 67   Resp 17   Ht 5\' 7"  (1.702 m)   Wt 200 lb 6.4 oz (90.9 kg)   BMI 31.39 kg/m    Physical Exam Vitals and nursing note reviewed.  Constitutional:      Appearance: She is well-developed.  HENT:     Head: Normocephalic and atraumatic.  Eyes:     Conjunctiva/sclera: Conjunctivae normal.  Cardiovascular:     Rate and Rhythm: Normal rate and regular rhythm.     Heart sounds: Normal heart sounds.  Pulmonary:     Effort: Pulmonary effort is normal.     Breath sounds: Normal breath sounds.  Abdominal:     General: Bowel sounds are normal.     Palpations: Abdomen is soft.  Musculoskeletal:     Cervical back: Normal range of motion.  Lymphadenopathy:     Cervical: No cervical adenopathy.  Skin:    General: Skin is warm and dry.     Capillary Refill: Capillary refill takes less than 2 seconds.  Neurological:     Mental Status: She is alert and oriented to person, place, and time.  Psychiatric:        Behavior: Behavior normal.      Musculoskeletal Exam: Patient had limited lateral rotation, flexion and extension  of the cervical spine with discomfort.  She had painful limited range of motion of the thoracic and lumbar spine.  She had bilateral trapezius spasm and generalized hyperalgesia.  Shoulder joints were in full range of motion.   Elbow joints and wrist joints were in full range of motion without any warmth swelling or synovitis.  She had no tenderness or synovitis over MCP joints.  Bilateral PIP and DIP thickening with no synovitis was noted.  Hip joints and knee joints were in good range of motion.  Left knee joint was replaced.  No warmth swelling or effusion was noted.  There was no tenderness over ankles MTPs or PIPs.  No synovitis was noted.  CDAI Exam: CDAI Score: -- Patient Global: --; Provider Global: -- Swollen: --; Tender: -- Joint Exam 01/15/2023   No joint exam has been documented for this visit   There is currently no information documented on the homunculus. Go to the Rheumatology activity and complete the homunculus joint exam.  Investigation: No additional findings.  Imaging: MM 3D SCREEN BREAST BILATERAL  Result Date: 12/24/2022 CLINICAL DATA:  Screening. EXAM: DIGITAL SCREENING BILATERAL MAMMOGRAM WITH TOMOSYNTHESIS AND CAD TECHNIQUE: Bilateral screening digital craniocaudal and mediolateral oblique mammograms were obtained. Bilateral screening digital breast tomosynthesis was performed. The images were evaluated with computer-aided detection. COMPARISON:  Previous exam(s). ACR Breast Density Category b: There are scattered areas of fibroglandular density. FINDINGS: There are no findings suspicious for malignancy. IMPRESSION: No mammographic evidence of malignancy. A result letter of this screening mammogram will be mailed directly to the patient. RECOMMENDATION: Screening mammogram in one year. (Code:SM-B-01Y) BI-RADS CATEGORY  1: Negative. Electronically Signed   By: Sherron Ales M.D.   On: 12/24/2022 09:03    Recent Labs: Lab Results  Component Value Date   WBC 5.6 08/25/2022   HGB 13.5 08/25/2022   PLT 298 08/25/2022   NA 139 08/25/2022   K 3.8 08/25/2022   CL 104 08/25/2022   CO2 30 08/25/2022   GLUCOSE 83 08/25/2022   BUN 12 08/25/2022   CREATININE 0.83 08/25/2022   BILITOT 0.4  08/25/2022   ALKPHOS 92 08/25/2022   AST 21 08/25/2022   ALT 20 08/25/2022   PROT 7.1 08/25/2022   ALBUMIN 4.0 08/25/2022   CALCIUM 9.3 08/25/2022   GFRAA 97 09/10/2020    Speciality Comments: No specialty comments available.  Procedures:  No procedures performed Allergies: Onion, Penicillins, Sulfa antibiotics, Yellow dye, Tape, Clindamycin/lincomycin, Latex, Lipitor [atorvastatin calcium], and Tetracycline   Assessment / Plan:     Visit Diagnoses: Bilateral hand pain -she complains of pain and discomfort in her bilateral hands and frequent swelling.  She gives history of significant morning stiffness.  No synovitis was noted.  Bilateral PIP and DIP thickening consistent with osteoarthritis was noted.  Will obtain autoimmune labs today.  Plan: XR Hand 2 View Right, XR Hand 2 View Left, bilateral hand x-rays were consistent with osteoarthritis.  Sedimentation rate, Rheumatoid factor, Cyclic citrul peptide antibody, IgG  Bilateral carpal tunnel syndrome-patient states she was diagnosed with carpal tunnel syndrome several years ago and still has symptoms.  Status post total knee replacement, left - December 2020 Dr. Eulah Pont.  Doing well.  Pain in both feet -she complains of discomfort in the bilateral feet.  She gives history of swelling in her feet.  No synovitis was noted.  Plan: XR Foot 2 Views Right, XR Foot 2 Views Left.  X-rays were suggestive of osteoarthritis of the feet.  Neck pain -she  had limited painful range of motion of her cervical spine.  Patient states she was seen by neurosurgeon yesterday who advised cortisone injection in her cervical spine.  She did not agree with the plan.  She is not planning to go for the follow-up visit.  Plan: XR Cervical Spine 2 or 3 views.  Multilevel spondylosis with facet joint arthropathy was noted.  Chronic midline low back pain without sciatica -she complains of pain and discomfort in her lower back for many years.  The pain is gradually  getting worse.  She denies any radiculopathy.  Plan: XR Lumbar Spine 2-3 Views.  Multilevel spondylosis with loss facet joint arthropathy was noted.  Levoscoliosis was noted.  Fibromyalgia -she was diagnosed with fibromyalgia syndrome and 70s.  She had generalized pain, hyperalgesia.  She had positive tender points.  Benefits of water aerobics and summing were discussed.  Plan: CK  HTN, goal below 140/90-blood pressure was elevated at 168/94.  Repeat blood pressure was elevated.  She was advised to monitor blood pressure closely and follow-up with the PCP.  Esophageal dysphagia  History of gastroesophageal reflux (GERD)  Mixed hyperlipidemia  Malignant neoplasm of upper-outer quadrant of left breast in female, estrogen receptor negative (HCC) - dxd 2018 partial mastectomy, RTX, CTX. She is followed by Dr. Mosetta Putt.  Drug-induced polyneuropathy (HCC)  Mild intermittent asthma without complication  Chronic migraine without aura without status migrainosus, not intractable  Bilateral occipital neuralgia  History of diverticulosis  Orders: Orders Placed This Encounter  Procedures   XR Cervical Spine 2 or 3 views   XR Lumbar Spine 2-3 Views   XR Hand 2 View Right   XR Hand 2 View Left   XR Foot 2 Views Right   XR Foot 2 Views Left   Sedimentation rate   Rheumatoid factor   Cyclic citrul peptide antibody, IgG   CK   No orders of the defined types were placed in this encounter.   Follow-Up Instructions: Return for Polyarthralgia.   Pollyann Savoy, MD  Note - This record has been created using Animal nutritionist.  Chart creation errors have been sought, but may not always  have been located. Such creation errors do not reflect on  the standard of medical care.

## 2023-01-15 ENCOUNTER — Encounter: Payer: Self-pay | Admitting: Rheumatology

## 2023-01-15 ENCOUNTER — Ambulatory Visit (INDEPENDENT_AMBULATORY_CARE_PROVIDER_SITE_OTHER): Payer: Medicare Other

## 2023-01-15 ENCOUNTER — Ambulatory Visit: Payer: Medicare Other

## 2023-01-15 ENCOUNTER — Ambulatory Visit: Payer: Medicare Other | Attending: Rheumatology | Admitting: Rheumatology

## 2023-01-15 VITALS — BP 165/91 | HR 69 | Resp 17 | Ht 67.0 in | Wt 200.4 lb

## 2023-01-15 DIAGNOSIS — Z171 Estrogen receptor negative status [ER-]: Secondary | ICD-10-CM | POA: Insufficient documentation

## 2023-01-15 DIAGNOSIS — G8929 Other chronic pain: Secondary | ICD-10-CM

## 2023-01-15 DIAGNOSIS — Z95828 Presence of other vascular implants and grafts: Secondary | ICD-10-CM

## 2023-01-15 DIAGNOSIS — M79672 Pain in left foot: Secondary | ICD-10-CM

## 2023-01-15 DIAGNOSIS — M79642 Pain in left hand: Secondary | ICD-10-CM

## 2023-01-15 DIAGNOSIS — R1319 Other dysphagia: Secondary | ICD-10-CM | POA: Diagnosis present

## 2023-01-15 DIAGNOSIS — C50412 Malignant neoplasm of upper-outer quadrant of left female breast: Secondary | ICD-10-CM | POA: Diagnosis present

## 2023-01-15 DIAGNOSIS — M542 Cervicalgia: Secondary | ICD-10-CM | POA: Diagnosis present

## 2023-01-15 DIAGNOSIS — M79671 Pain in right foot: Secondary | ICD-10-CM

## 2023-01-15 DIAGNOSIS — G43709 Chronic migraine without aura, not intractable, without status migrainosus: Secondary | ICD-10-CM | POA: Diagnosis present

## 2023-01-15 DIAGNOSIS — G5603 Carpal tunnel syndrome, bilateral upper limbs: Secondary | ICD-10-CM | POA: Diagnosis present

## 2023-01-15 DIAGNOSIS — M5481 Occipital neuralgia: Secondary | ICD-10-CM

## 2023-01-15 DIAGNOSIS — M79641 Pain in right hand: Secondary | ICD-10-CM | POA: Diagnosis present

## 2023-01-15 DIAGNOSIS — M545 Low back pain, unspecified: Secondary | ICD-10-CM | POA: Diagnosis present

## 2023-01-15 DIAGNOSIS — G620 Drug-induced polyneuropathy: Secondary | ICD-10-CM | POA: Insufficient documentation

## 2023-01-15 DIAGNOSIS — J4520 Mild intermittent asthma, uncomplicated: Secondary | ICD-10-CM

## 2023-01-15 DIAGNOSIS — M1712 Unilateral primary osteoarthritis, left knee: Secondary | ICD-10-CM

## 2023-01-15 DIAGNOSIS — I10 Essential (primary) hypertension: Secondary | ICD-10-CM

## 2023-01-15 DIAGNOSIS — M797 Fibromyalgia: Secondary | ICD-10-CM | POA: Diagnosis present

## 2023-01-15 DIAGNOSIS — Z96652 Presence of left artificial knee joint: Secondary | ICD-10-CM

## 2023-01-15 DIAGNOSIS — E782 Mixed hyperlipidemia: Secondary | ICD-10-CM | POA: Diagnosis present

## 2023-01-15 DIAGNOSIS — D1722 Benign lipomatous neoplasm of skin and subcutaneous tissue of left arm: Secondary | ICD-10-CM

## 2023-01-15 DIAGNOSIS — Z8719 Personal history of other diseases of the digestive system: Secondary | ICD-10-CM | POA: Insufficient documentation

## 2023-01-16 LAB — CK: Total CK: 153 U/L — ABNORMAL HIGH (ref 29–143)

## 2023-01-16 LAB — SEDIMENTATION RATE: Sed Rate: 9 mm/h (ref 0–30)

## 2023-01-17 LAB — CYCLIC CITRUL PEPTIDE ANTIBODY, IGG: Cyclic Citrullin Peptide Ab: <16 UNITS

## 2023-01-17 LAB — RHEUMATOID FACTOR: Rheumatoid fact SerPl-aCnc: <10 IU/mL (ref <14)

## 2023-01-30 NOTE — Progress Notes (Signed)
Office Visit Note  Patient: Mary Wise             Date of Birth: Dec 14, 1950           MRN: [id number]             PCP: John Giovanni, MD Referring: John Giovanni, MD Visit Date: 02/10/2023 Occupation: @GUAROCC @  Subjective:  Pain in multiple joints  History of Present Illness: Mary Wise is a 72 y.o. female with osteoarthritis and degenerative disc disease.  She returns for follow-up visit.  She states she continues to have pain and discomfort in her neck and lower back.  She has difficulty walking due to lower back pain.  She continues to have discomfort in her shoulders, elbows, wrists, hands, hips knees and feet.  She has not noticed any joint swelling.  She states her left total knee replacement is doing well.  She is in constant discomfort.  She has significant morning stiffness.    Activities of Daily Living:  Patient reports morning stiffness for 2 hours.   Patient Reports nocturnal pain.  Difficulty dressing/grooming: Denies Difficulty climbing stairs: Denies Difficulty getting out of chair: Denies Difficulty using hands for taps, buttons, cutlery, and/or writing: Reports  Review of Systems  Constitutional:  Positive for fatigue.  HENT:  Positive for mouth dryness. Negative for mouth sores.   Eyes:  Positive for dryness.  Respiratory:  Negative for difficulty breathing.   Cardiovascular:  Negative for chest pain.  Gastrointestinal:  Negative for blood in stool, constipation and diarrhea.  Endocrine: Negative for increased urination.  Genitourinary:  Negative for involuntary urination.  Musculoskeletal:  Positive for joint pain, joint pain, joint swelling, myalgias, muscle weakness, morning stiffness, muscle tenderness and myalgias. Negative for gait problem.  Skin:  Positive for hair loss and sensitivity to sunlight. Negative for color change and rash.  Allergic/Immunologic: Positive for susceptible to infections.  Neurological:  Positive for headaches.  Negative for dizziness.  Hematological:  Negative for swollen glands.  Psychiatric/Behavioral:  Positive for sleep disturbance. Negative for depressed mood. The patient is nervous/anxious.     PMFS History:  Patient Active Problem List   Diagnosis Date Noted   Bilateral occipital neuralgia 11/04/2022   Skin lesion of right arm 10/23/2022   Neoplasm of skin of chest 10/23/2022   Neoplasm of uncertain behavior of forearm 10/23/2022   GERD (gastroesophageal reflux disease) 03/26/2021   Abdominal pain 03/26/2021   Esophageal dysphagia 03/26/2021   Wound dehiscence 04/12/2020   Atypical mole of neck 03/06/2020   Sebaceous cyst 02/16/2020   Skin tag 02/16/2020   Primary osteoarthritis of left knee 06/13/2019   OA (osteoarthritis) of knee 05/18/2019   Asthma 05/18/2019   Hyperlipemia 05/18/2019   Osteopenia 02/02/2019   Muscle cramps 01/10/2019   Peripheral neuropathy 10/11/2018   Migraines 02/01/2018   Insomnia 02/01/2018   Genetic testing 02/01/2018   Family history of cancer    Port-A-Cath in place 10/12/2017   Malignant neoplasm of upper-outer quadrant of left breast in female, estrogen receptor negative (HCC)    HTN, goal below 140/90 07/01/2017   Anxiety 07/01/2017   Lipoma of left shoulder 06/19/2017   Left breast mass 06/19/2017    Past Medical History:  Diagnosis Date   Anxiety    Asthma    Cancer (HCC)    left breast cancer   Family history of cancer    Genetic testing 02/01/2018   Multi-Cancer panel (83 genes) @ [facility] - No pathogenic mutations  detected   GERD (gastroesophageal reflux disease)    Hemorrhoids    History of radiation therapy 05/27/18- 06/23/18   Left Breast 40.05 Gy in 15 fractions, left breast boost 10 Gy in 5 fractions.    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 primary; deceased 71    Breast cancer Mother    Melanoma Mother    Cancer Father 22       brain cancer; deceased 30   Ovarian cancer Sister 98       deceased 1   Pancreatic cancer Sister    Diabetes Sister    Vision loss Sister    Diabetes Sister    Diabetes Sister    Diabetes Brother    Prostate cancer Brother    Stomach cancer Maternal Aunt 35       deceased 31   Lung cancer Maternal Aunt    Cancer Paternal Aunt        ink. primary in 2 aunts   Cancer Paternal Uncle        brain cancer    Cancer Paternal Uncle        brain tumor    Cancer Paternal Uncle        brain tumor    Breast cancer Maternal Grandmother        dx 72s; deceased 45   Cancer Paternal Grandmother        "female ca"   Cancer Paternal Grandfather        lung cancer   Stomach cancer Cousin        2 sons of an unaffected maternal uncle   Cancer Other        "female ca"   Melanoma Other    Scoliosis Daughter    Past Surgical History:  Procedure Laterality Date   ABDOMINAL HYSTERECTOMY     benign spine tumor  2022   BREAST LUMPECTOMY Left 2018   CATARACT EXTRACTION Bilateral    CESAREAN SECTION     CHOLECYSTECTOMY     COLONOSCOPY WITH PROPOFOL N/A 01/15/2021   Procedure: COLONOSCOPY WITH PROPOFOL;  Surgeon: Dolores Frame, MD;  Location: [facility];  Service: Gastroenterology;  Laterality: N/A;  10:00   ERCP     ESOPHAGEAL DILATION N/A 04/10/2021   Procedure: ESOPHAGEAL DILATION;  Surgeon: Malissa Hippo, MD;  Location: [facility];  Service: Endoscopy;  Laterality: N/A;   ESOPHAGOGASTRODUODENOSCOPY (EGD) WITH PROPOFOL N/A 04/10/2021   Procedure: ESOPHAGOGASTRODUODENOSCOPY (EGD) WITH PROPOFOL;  Surgeon: Malissa Hippo, MD;  Location: [facility];  Service: Endoscopy;  Laterality: N/A;  10:55   EXCISION OF SKIN TAG N/A 11/14/2022   Procedure: EXCISION OF SKIN TAG;  Surgeon: Lucretia Roers, MD;  Location: [facility];  Service: General;  Laterality: N/A;   IR FLUORO GUIDE PORT INSERTION RIGHT   10/05/2017   IR REMOVAL TUN ACCESS W/ PORT W/O FL MOD SED  05/13/2018   IR US GUIDE VASC ACCESS RIGHT  10/05/2017   LIPOMA EXCISION Left 03/16/2018   left shoulder   LIVER SURGERY     MASS EXCISION N/A 03/30/2020   Procedure: EXCISION OF SEBACEOUS CYST ON LEFT NECK, SEBACEOUSE CYST ON BACK AND ATYPICAL MOLE ON RIGHT NECK, AND LEFT AXILLA  SKIN TAG;  Surgeon: Lucretia Roers, MD;  Location: [facility];  Service: General;  Laterality: N/A;   MASS EXCISION Right 11/14/2022   Procedure: EXCISION MASS, NEOPLASM FOREARM;  Surgeon: Lucretia Roers, MD;  Location: [facility];  Service: General;  Laterality: Right;   MOLE REMOVAL Left 11/14/2022   Procedure: MOLE REMOVAL Left CHEST;  Surgeon: Lucretia Roers, MD;  Location: [facility];  Service: General;  Laterality: Left;   PARTIAL MASTECTOMY WITH AXILLARY SENTINEL LYMPH NODE BIOPSY Left 07/31/2017   Procedure: LEFT PARTIAL MASTECTOMY WITH AXILLARY SENTINEL LYMPH NODE BIOPSY;  Surgeon: Lucretia Roers, MD;  Location: [facility];  Service: General;  Laterality: Left;   POLYPECTOMY  01/15/2021   Procedure: POLYPECTOMY;  Surgeon: Dolores Frame, MD;  [facility]: [facility];  Service: Gastroenterology;;   TOTAL KNEE ARTHROPLASTY Left 07/05/2019   Procedure: TOTAL KNEE ARTHROPLASTY;  Surgeon: Sheral Apley, MD;  Location: [facility];  Service: Orthopedics;  Laterality: Left;   Social History   Social History Narrative   Lives in [facility] on [address].    Immunization History  Administered Date(s) Administered   Moderna Sars-Covid-2 Vaccination 12/07/2019, 01/04/2020   Pneumococcal Conjugate-13 05/29/2020   Tdap 03/21/1998, 11/02/2013     Objective: Vital Signs: BP (!) 139/94 (BP Location: Right Arm, Patient Position: Sitting, Cuff Size: Normal)   Pulse 80   Resp 14   Ht 5\' 8"  (1.727 m)   Wt 196 lb (88.9 kg)   BMI 29.80 kg/m    Physical Exam Vitals and nursing note reviewed.  Constitutional:      Appearance: She is  well-developed.  HENT:     Head: Normocephalic and atraumatic.  Eyes:     Conjunctiva/sclera: Conjunctivae normal.  Cardiovascular:     Rate and Rhythm: Normal rate and regular rhythm.     Heart sounds: Normal heart sounds.  Pulmonary:     Effort: Pulmonary effort is normal.     Breath sounds: Normal breath sounds.  Abdominal:     General: Bowel sounds are normal.     Palpations: Abdomen is soft.  Musculoskeletal:     Cervical back: Normal range of motion.  Lymphadenopathy:     Cervical: No cervical adenopathy.  Skin:    General: Skin is warm and dry.     Capillary Refill: Capillary refill takes less than 2 seconds.  Neurological:     Mental Status: She is alert and oriented to person, place, and time.  Psychiatric:        Behavior: Behavior normal.      Musculoskeletal Exam: She had limited lateral rotation of the cervical spine.  She had painful limited range of motion of lumbar spine.  Thoracolumbar scoliosis was noted.  Shoulder joints, elbow joints, wrist joints were in good range of motion without synovitis.  There was no synovitis over MCPs or PIP joints.  Bilateral CMC PIP and DIP thickening with no synovitis was noted.  Hip joints and knee joints were in good range of motion.  Left knee joint was replaced.  There was no tenderness over ankles or MTPs.  CDAI Exam: CDAI Score: -- Patient Global: --; Provider Global: -- Swollen: --; Tender: -- Joint Exam 02/10/2023   No joint exam has been documented for this visit   There is currently no information documented on the homunculus. Go to the Rheumatology activity and complete the homunculus joint exam.  Investigation: No additional findings.  Imaging: XR Lumbar Spine 2-3 Views  Result  Date: 01/15/2023 Levoscoliosis was noted.  Multilevel spondylosis with narrowing between L1-L2, L2-L3, L4-L5 with anterior spurring and facet joint arthropathy was noted.  No SI joint narrowing was noted. Impression: These findings are  suggestive of multilevel spondylosis and facet joint arthropathy.  XR Cervical Spine 2 or 3 views  Result Date: 01/15/2023 Multilevel spondylosis with narrowing between C5-C6 and C6-C7 was noted.  Facet joint arthropathy was noted. Impression: These findings are suggestive of degenerative disc disease of cervical spine and facet joint arthropathy.  XR Foot 2 Views Left  Result Date: 01/15/2023 First MTP, PIP and DIP narrowing was noted.  No intertarsal, tibiotalar or subtalar joint space narrowing was noted.  Inferior and posterior calcaneal spurs were noted. Impression: These findings are suggestive of osteoarthritis of the foot.  XR Foot 2 Views Right  Result Date: 01/15/2023 First MTP, PIP and DIP narrowing was noted.  No intertarsal, tibiotalar or subtalar joint space narrowing was noted.  Inferior and posterior calcaneal spurs were noted. Impression: These findings are suggestive of osteoarthritis of the foot.  XR Hand 2 View Left  Result Date: 01/15/2023 CMC, PIP and DIP narrowing was noted.  No MCP, intercarpal or radiocarpal joint space narrowing was noted.  No erosive changes were noted. Impression: These findings are suggestive of osteoarthritis of the hand.  XR Hand 2 View Right  Result Date: 01/15/2023 CMC, PIP and DIP narrowing was noted.  Spurring of first DIP joint was noted.  No MCP, intercarpal radiocarpal joint space narrowing was noted.  No erosive changes were noted. Impression: These findings are suggestive of osteoarthritis of the hand.   Recent Labs: Lab Results  Component Value Date   WBC 5.6 08/25/2022   HGB 13.5 08/25/2022   PLT 298 08/25/2022   NA 139 08/25/2022   K 3.8 08/25/2022   CL 104 08/25/2022   CO2 30 08/25/2022   GLUCOSE 83 08/25/2022   BUN 12 08/25/2022   CREATININE 0.83 08/25/2022   BILITOT 0.4 08/25/2022   ALKPHOS 92 08/25/2022   AST 21 08/25/2022   ALT 20 08/25/2022   PROT 7.1 08/25/2022   ALBUMIN 4.0 08/25/2022   CALCIUM 9.3 08/25/2022    GFRAA 97 09/10/2020   January 15, 2023 ESR 9, RF negative, anti-CCP negative, [id number]  Speciality Comments: No specialty comments available.  Procedures:  No procedures performed Allergies: Onion, Penicillins, Sulfa antibiotics, Yellow dye, Tape, Clindamycin/lincomycin, Latex, Lipitor [atorvastatin calcium], and Tetracycline   Assessment / Plan:     Visit Diagnoses: Primary osteoarthritis of both hands -she complains of pain and discomfort in her bilateral hands with intermittent swelling.  No synovitis was noted.  Clinical and radiographic findings were consistent with osteoarthritis.  X-ray findings were reviewed with the patient.  Joint protection muscle strengthening was discussed.  Bilateral carpal tunnel syndrome - According to the patient she was diagnosed with bilateral carpal tunnel syndrome several years ago and she still has symptoms.  Use of carpal tunnel brace was advised.  Status post total knee replacement, left - 2020 by Dr. Eulah Pont.  Doing well.  Primary osteoarthritis of both feet -she has chronic discomfort in her feet.  No synovitis was noted.  Clinical and radiographic findings were consistent with osteoarthritis.  X-ray findings were reviewed with the patient.  Use of proper fitting shoes were advised.  DDD (degenerative disc disease), cervical - She had limited range of motion of the cervical spine.  Followed by [facility] x-rays showed significant narrowing between C5-C6 and C6-C7 and facet joint arthropathy. -  X-rays were reviewed with the patient.  A handout on exercises was given.  I will refer her to physical therapy and orthopedics.  Plan: Ambulatory referral to Physical Therapy, Ambulatory referral to Orthopedics  DDD (degenerative disc disease), lumbar -she is chronic discomfort.  She has difficulty walking due to lower back pain.  She also has scoliosis.  Multilevel spondylosis with L1-L2, L2-L3, L4-L5 narrowing and spurring was noted.  Facet joint arthropathy was noted.   X-ray findings were reviewed with the patient.  She was referred to physical therapy.  I will also place order for orthopedics.- Plan: Ambulatory referral to Physical Therapy, Ambulatory referral to Orthopedics  Osteopenia of multiple sites - 04/05/21 The BMD measured at Forearm Radius 33% is 0.588 g/cm2 with a T-score of -1.8.  DEXA results were reviewed with the patient.  Calcium discussed.  Resistive exercises were discussed.  Benefits of regular walking were discussed.  She will need repeat DEXA scan this year.  Fibromyalgia - Diagnosed in 1970s with fibromyalgia syndrome.  She can use to have generalized pain and hyperalgesia.  Benefits of swimming and water aerobics were discussed.  Other medical problems are listed as follows:  History of gastroesophageal reflux (GERD)  Esophageal dysphagia  History of diverticulosis  HTN, goal below 140/90-blood pressure was elevated at 155/82.  Repeat blood pressure was 139/94.  Patient was advised to monitor blood pressure closely and follow-up with the PCP.  Mixed hyperlipidemia  Malignant neoplasm of upper-outer quadrant of left breast in female, estrogen receptor negative (HCC)  Drug-induced polyneuropathy (HCC)  Bilateral occipital neuralgia  Chronic migraine without aura without status migrainosus, not intractable  Mild intermittent asthma without complication  Orders: Orders Placed This Encounter  Procedures   Ambulatory referral to Physical Therapy   Ambulatory referral to Orthopedics   No orders of the defined types were placed in this encounter.   Follow-Up Instructions: Return if symptoms worsen or fail to improve, for Osteoarthritis.   Pollyann Savoy, MD  Note - This record has been created using Animal nutritionist.  Chart creation errors have been sought, but may not always  have been located. Such creation errors do not reflect on  the standard of medical care.

## 2023-02-10 ENCOUNTER — Ambulatory Visit: Payer: Medicare Other | Admitting: Rheumatology

## 2023-02-10 ENCOUNTER — Encounter: Payer: Self-pay | Admitting: Rheumatology

## 2023-02-10 VITALS — BP 139/94 | HR 80 | Resp 14 | Ht 68.0 in | Wt 196.0 lb

## 2023-02-10 DIAGNOSIS — M5481 Occipital neuralgia: Secondary | ICD-10-CM | POA: Insufficient documentation

## 2023-02-10 DIAGNOSIS — C50412 Malignant neoplasm of upper-outer quadrant of left female breast: Secondary | ICD-10-CM | POA: Insufficient documentation

## 2023-02-10 DIAGNOSIS — M19072 Primary osteoarthritis, left ankle and foot: Secondary | ICD-10-CM | POA: Insufficient documentation

## 2023-02-10 DIAGNOSIS — I10 Essential (primary) hypertension: Secondary | ICD-10-CM | POA: Insufficient documentation

## 2023-02-10 DIAGNOSIS — M797 Fibromyalgia: Secondary | ICD-10-CM | POA: Diagnosis present

## 2023-02-10 DIAGNOSIS — M5030 Other cervical disc degeneration, unspecified cervical region: Secondary | ICD-10-CM | POA: Insufficient documentation

## 2023-02-10 DIAGNOSIS — Z8719 Personal history of other diseases of the digestive system: Secondary | ICD-10-CM | POA: Diagnosis present

## 2023-02-10 DIAGNOSIS — M8589 Other specified disorders of bone density and structure, multiple sites: Secondary | ICD-10-CM | POA: Diagnosis present

## 2023-02-10 DIAGNOSIS — Z96652 Presence of left artificial knee joint: Secondary | ICD-10-CM | POA: Insufficient documentation

## 2023-02-10 DIAGNOSIS — M19041 Primary osteoarthritis, right hand: Secondary | ICD-10-CM | POA: Insufficient documentation

## 2023-02-10 DIAGNOSIS — J4520 Mild intermittent asthma, uncomplicated: Secondary | ICD-10-CM | POA: Insufficient documentation

## 2023-02-10 DIAGNOSIS — M19071 Primary osteoarthritis, right ankle and foot: Secondary | ICD-10-CM | POA: Insufficient documentation

## 2023-02-10 DIAGNOSIS — M5136 Other intervertebral disc degeneration, lumbar region: Secondary | ICD-10-CM | POA: Insufficient documentation

## 2023-02-10 DIAGNOSIS — M19042 Primary osteoarthritis, left hand: Secondary | ICD-10-CM | POA: Insufficient documentation

## 2023-02-10 DIAGNOSIS — E782 Mixed hyperlipidemia: Secondary | ICD-10-CM | POA: Insufficient documentation

## 2023-02-10 DIAGNOSIS — G43709 Chronic migraine without aura, not intractable, without status migrainosus: Secondary | ICD-10-CM | POA: Insufficient documentation

## 2023-02-10 DIAGNOSIS — R1319 Other dysphagia: Secondary | ICD-10-CM | POA: Insufficient documentation

## 2023-02-10 DIAGNOSIS — G5603 Carpal tunnel syndrome, bilateral upper limbs: Secondary | ICD-10-CM | POA: Diagnosis not present

## 2023-02-10 DIAGNOSIS — G620 Drug-induced polyneuropathy: Secondary | ICD-10-CM | POA: Diagnosis present

## 2023-02-10 DIAGNOSIS — Z171 Estrogen receptor negative status [ER-]: Secondary | ICD-10-CM | POA: Insufficient documentation

## 2023-02-10 NOTE — Patient Instructions (Signed)
Osteopenia  Osteopenia is a loss of thickness (density) inside the bones. Another name for osteopenia is low bone mass. Mild osteopenia is a normal part of aging. It is not a disease, and it does not cause symptoms. However, if you have osteopenia and continue to lose bone mass, you could develop a condition that causes the bones to become thin and break more easily (osteoporosis). Osteoporosis can cause you to lose some height, have back pain, and have a stooped posture. Although osteopenia is not a disease, making changes to your lifestyle and diet can help to prevent osteopenia from developing into osteoporosis. What are the causes? Osteopenia is caused by loss of calcium in the bones. Bones are constantly changing. Old bone cells are continually being replaced with new bone cells. This process builds new bone. The mineral calcium is needed to build new bone and maintain bone density. Bone density is usually highest around age 42. After that, most people's bodies cannot replace all the bone they have lost with new bone. What increases the risk? You are more likely to develop this condition if: You are older than age 14. You are a woman who went through menopause early. You have a long illness that keeps you in bed. You do not get enough exercise. You lack certain nutrients (malnutrition). You have an overactive thyroid gland (hyperthyroidism). You use products that contain nicotine or tobacco, such as cigarettes, e-cigarettes and chewing tobacco, or you drink a lot of alcohol. You are taking medicines that weaken the bones, such as steroids. What are the signs or symptoms? This condition does not cause any symptoms. You may have a slightly higher risk for bone breaks (fractures), so getting fractures more easily than normal may be an indication of osteopenia. How is this diagnosed? This condition may be diagnosed based on an X-ray exam that measures bone density (dual-energy X-ray  absorptiometry, or DEXA). This test can measure bone density in your hips, spine, and wrists. Osteopenia has no symptoms, so this condition is usually diagnosed after a routine bone density screening test is done for osteoporosis. This routine screening is usually done for: Women who are age 35 or older. Men who are age 24 or older. If you have risk factors for osteopenia, you may have the screening test at an earlier age. How is this treated? Making dietary and lifestyle changes can lower your risk for osteoporosis. If you have severe osteopenia that is close to becoming osteoporosis, this condition can be treated with medicines and dietary supplements such as calcium and vitamin D. These supplements help to rebuild bone density. Follow these instructions at home: Eating and drinking Eat a diet that is high in calcium and vitamin D. Calcium is found in dairy products, beans, salmon, and leafy green vegetables like spinach and broccoli. Look for foods that have vitamin D and calcium added to them (fortified foods), such as orange juice, cereal, and bread.  Lifestyle Do 30 minutes or more of a weight-bearing exercise every day, such as walking, jogging, or playing a sport. These types of exercises strengthen the bones. Do not use any products that contain nicotine or tobacco, such as cigarettes, e-cigarettes, and chewing tobacco. If you need help quitting, ask your health care provider. Do not drink alcohol if: Your health care provider tells you not to drink. You are pregnant, may be pregnant, or are planning to become pregnant. If you drink alcohol: Limit how much you use to: 0-1 drink a day for women. 0-2  drinks a day for men. Be aware of how much alcohol is in your drink. In the [place] one drink equals one 12 oz bottle of beer (355 mL), one 5 oz glass of wine (148 mL), or one 1 oz glass of hard liquor (44 mL). General instructions Take over-the-counter and prescription medicines only as  told by your health care provider. These include vitamins and supplements. Take precautions at home to lower your risk of falling, such as: Keeping rooms well-lit and free of clutter, such as cords. Installing safety rails on stairs. Using rubber mats in the bathroom or other areas that are often wet or slippery. Keep all follow-up visits. This is important. Contact a health care provider if: You have not had a bone density screening for osteoporosis and you are: A woman who is age 14 or older. A man who is age 40 or older. You are a postmenopausal woman who has not had a bone density screening for osteoporosis. You are older than age 66 and you want to know if you should have bone density screening for osteoporosis. Summary Osteopenia is a loss of thickness (density) inside the bones. Another name for osteopenia is low bone mass. Osteopenia is not a disease, but it may increase your risk for a condition that causes the bones to become thin and break more easily (osteoporosis). You may be at risk for osteopenia if you are older than age 37 or if you are a woman who went through early menopause. Osteopenia does not cause any symptoms, but it can be diagnosed with a bone density screening test. Dietary and lifestyle changes are the first treatment for osteopenia. These may lower your risk for osteoporosis. This information is not intended to replace advice given to you by your health care provider. Make sure you discuss any questions you have with your health care provider. Document Revised: 01/05/2020 Document Reviewed: 01/05/2020 [employer] Patient Education  2024 [employer] Cervical Strain and Sprain Rehab Ask your health care provider which exercises are safe for you. Do exercises exactly as told by your health care provider and adjust them as directed. It is normal to feel mild stretching, pulling, tightness, or discomfort as you do these exercises. Stop right away if you feel sudden pain  or your pain gets worse. Do not begin these exercises until told by your health care provider. Stretching and range-of-motion exercises Cervical side bending  Using good posture, sit on a stable chair or stand up. Without moving your shoulders, slowly tilt your left / right ear to your shoulder until you feel a stretch in the neck muscles on the opposite side. You should be looking straight ahead. Hold for __________ seconds. Repeat with the other side of your neck. Repeat __________ times. Complete this exercise __________ times a day. Cervical rotation  Using good posture, sit on a stable chair or stand up. Slowly turn your head to the side as if you are looking over your left / right shoulder. Keep your eyes level with the ground. Stop when you feel a stretch along the side and the back of your neck. Hold for __________ seconds. Repeat this by turning to your other side. Repeat __________ times. Complete this exercise __________ times a day. Thoracic extension and pectoral stretch  Roll a towel or a small blanket so it is about 4 inches (10 cm) in diameter. Lie down on your back on a firm surface. Put the towel in the middle of your back across your spine.  It should not be under your shoulder blades. Put your hands behind your head and let your elbows fall out to your sides. Hold for __________ seconds. Repeat __________ times. Complete this exercise __________ times a day. Strengthening exercises Upper cervical flexion  Lie on your back with a thin pillow behind your head or a small, rolled-up towel under your neck. Gently tuck your chin toward your chest and nod your head down to look toward your feet. Do not lift your head off the pillow. Hold for __________ seconds. Release the tension slowly. Relax your neck muscles completely before you repeat this exercise. Repeat __________ times. Complete this exercise __________ times a day. Cervical extension  Stand about 6 inches  (15 cm) away from a wall, with your back facing the wall. Place a soft object, about 6-8 inches (15-20 cm) in diameter, between the back of your head and the wall. A soft object could be a small pillow, a ball, or a folded towel. Gently tilt your head back and press into the soft object. Keep your jaw and forehead relaxed. Hold for __________ seconds. Release the tension slowly. Relax your neck muscles completely before you repeat this exercise. Repeat __________ times. Complete this exercise __________ times a day. Posture and body mechanics Body mechanics refer to the movements and positions of your body while you do your daily activities. Posture is part of body mechanics. Good posture and healthy body mechanics can help to relieve stress in your body's tissues and joints. Good posture means that your spine is in its natural S-curve position (your spine is neutral), your shoulders are pulled back slightly, and your head is not tipped forward. The following are general guidelines for using improved posture and body mechanics in your everyday activities. Sitting  When sitting, keep your spine neutral and keep your feet flat on the floor. Use a footrest, if needed, and keep your thighs parallel to the floor. Avoid rounding your shoulders. Avoid tilting your head forward. When working at a desk or a computer, keep your desk at a height where your hands are slightly lower than your elbows. Slide your chair under your desk so you are close enough to maintain good posture. When working at a computer, place your monitor at a height where you are looking straight ahead and you do not have to tilt your head forward or downward to look at the screen. Standing  When standing, keep your spine neutral and keep your feet about hip-width apart. Keep a slight bend in your knees. Your ears, shoulders, and hips should line up. When you do a task in which you stand in one place for a long time, place one foot up on  a stable object that is 2-4 inches (5-10 cm) high, such as a footstool. This helps keep your spine neutral. Resting When lying down and resting, avoid positions that are most painful for you. Try to support your neck in a neutral position. You can use a contour pillow or a small rolled-up towel. Your pillow should support your neck but not push on it. This information is not intended to replace advice given to you by your health care provider. Make sure you discuss any questions you have with your health care provider. Document Revised: 02/10/2022 Document Reviewed: 02/10/2022 [employer] Patient Education  2024 [employer]  Low Back Sprain or Strain Rehab Ask your health care provider which exercises are safe for you. Do exercises exactly as told by your health care provider  and adjust them as directed. It is normal to feel mild stretching, pulling, tightness, or discomfort as you do these exercises. Stop right away if you feel sudden pain or your pain gets worse. Do not begin these exercises until told by your health care provider. Stretching and range-of-motion exercises These exercises warm up your muscles and joints and improve the movement and flexibility of your back. These exercises also help to relieve pain, numbness, and tingling. Lumbar rotation  Lie on your back on a firm bed or the floor with your knees bent. Straighten your arms out to your sides so each arm forms a 90-degree angle (right angle) with a side of your body. Slowly move (rotate) both of your knees to one side of your body until you feel a stretch in your lower back (lumbar). Try not to let your shoulders lift off the floor. Hold this position for __________ seconds. Tense your abdominal muscles and slowly move your knees back to the starting position. Repeat this exercise on the other side of your body. Repeat __________ times. Complete this exercise __________ times a day. Single knee to chest  Lie on your back on a  firm bed or the floor with both legs straight. Bend one of your knees. Use your hands to move your knee up toward your chest until you feel a gentle stretch in your lower back and buttock. Hold your leg in this position by holding on to the front of your knee. Keep your other leg as straight as possible. Hold this position for __________ seconds. Slowly return to the starting position. Repeat with your other leg. Repeat __________ times. Complete this exercise __________ times a day. Prone extension on elbows  Lie on your abdomen on a firm bed or the floor (prone position). Prop yourself up on your elbows. Use your arms to help lift your chest up until you feel a gentle stretch in your abdomen and your lower back. This will place some of your body weight on your elbows. If this is uncomfortable, try stacking pillows under your chest. Your hips should stay down, against the surface that you are lying on. Keep your hip and back muscles relaxed. Hold this position for __________ seconds. Slowly relax your upper body and return to the starting position. Repeat __________ times. Complete this exercise __________ times a day. Strengthening exercises These exercises build strength and endurance in your back. Endurance is the ability to use your muscles for a long time, even after they get tired. Pelvic tilt This exercise strengthens the muscles that lie deep in the abdomen. Lie on your back on a firm bed or the floor with your legs extended. Bend your knees so they are pointing toward the ceiling and your feet are flat on the floor. Tighten your lower abdominal muscles to press your lower back against the floor. This motion will tilt your pelvis so your tailbone points up toward the ceiling instead of pointing to your feet or the floor. To help with this exercise, you may place a small towel under your lower back and try to push your back into the towel. Hold this position for __________  seconds. Let your muscles relax completely before you repeat this exercise. Repeat __________ times. Complete this exercise __________ times a day. Alternating arm and leg raises  Get on your hands and knees on a firm surface. If you are on a hard floor, you may want to use padding, such as an exercise mat, to cushion  your knees. Line up your arms and legs. Your hands should be directly below your shoulders, and your knees should be directly below your hips. Lift your left leg behind you. At the same time, raise your right arm and straighten it in front of you. Do not lift your leg higher than your hip. Do not lift your arm higher than your shoulder. Keep your abdominal and back muscles tight. Keep your hips facing the ground. Do not arch your back. Keep your balance carefully, and do not hold your breath. Hold this position for __________ seconds. Slowly return to the starting position. Repeat with your right leg and your left arm. Repeat __________ times. Complete this exercise __________ times a day. Abdominal set with straight leg raise  Lie on your back on a firm bed or the floor. Bend one of your knees and keep your other leg straight. Tense your abdominal muscles and lift your straight leg up, 4-6 inches (10-15 cm) off the ground. Keep your abdominal muscles tight and hold this position for __________ seconds. Do not hold your breath. Do not arch your back. Keep it flat against the ground. Keep your abdominal muscles tense as you slowly lower your leg back to the starting position. Repeat with your other leg. Repeat __________ times. Complete this exercise __________ times a day. Single leg lower with bent knees Lie on your back on a firm bed or the floor. Tense your abdominal muscles and lift your feet off the floor, one foot at a time, so your knees and hips are bent in 90-degree angles (right angles). Your knees should be over your hips and your lower legs should be parallel  to the floor. Keeping your abdominal muscles tense and your knee bent, slowly lower one of your legs so your toe touches the ground. Lift your leg back up to return to the starting position. Do not hold your breath. Do not let your back arch. Keep your back flat against the ground. Repeat with your other leg. Repeat __________ times. Complete this exercise __________ times a day. Posture and body mechanics Good posture and healthy body mechanics can help to relieve stress in your body's tissues and joints. Body mechanics refers to the movements and positions of your body while you do your daily activities. Posture is part of body mechanics. Good posture means: Your spine is in its natural S-curve position (neutral). Your shoulders are pulled back slightly. Your head is not tipped forward (neutral). Follow these guidelines to improve your posture and body mechanics in your everyday activities. Standing  When standing, keep your spine neutral and your feet about hip-width apart. Keep a slight bend in your knees. Your ears, shoulders, and hips should line up. When you do a task in which you stand in one place for a long time, place one foot up on a stable object that is 2-4 inches (5-10 cm) high, such as a footstool. This helps keep your spine neutral. Sitting  When sitting, keep your spine neutral and keep your feet flat on the floor. Use a footrest, if necessary, and keep your thighs parallel to the floor. Avoid rounding your shoulders, and avoid tilting your head forward. When working at a desk or a computer, keep your desk at a height where your hands are slightly lower than your elbows. Slide your chair under your desk so you are close enough to maintain good posture. When working at a computer, place your monitor at a height where you are  looking straight ahead and you do not have to tilt your head forward or downward to look at the screen. Resting When lying down and resting, avoid  positions that are most painful for you. If you have pain with activities such as sitting, bending, stooping, or squatting, lie in a position in which your body does not bend very much. For example, avoid curling up on your side with your arms and knees near your chest (fetal position). If you have pain with activities such as standing for a long time or reaching with your arms, lie with your spine in a neutral position and bend your knees slightly. Try the following positions: Lying on your side with a pillow between your knees. Lying on your back with a pillow under your knees. Lifting  When lifting objects, keep your feet at least shoulder-width apart and tighten your abdominal muscles. Bend your knees and hips and keep your spine neutral. It is important to lift using the strength of your legs, not your back. Do not lock your knees straight out. Always ask for help to lift heavy or awkward objects. This information is not intended to replace advice given to you by your health care provider. Make sure you discuss any questions you have with your health care provider. Document Revised: 10/08/2020 Document Reviewed: 10/08/2020 [employer] Patient Education  2024 [employer].

## 2023-03-04 ENCOUNTER — Encounter: Payer: Self-pay | Admitting: Cardiology

## 2023-03-24 ENCOUNTER — Ambulatory Visit: Payer: Medicare Other | Admitting: Cardiology

## 2023-04-03 ENCOUNTER — Ambulatory Visit: Payer: Medicare Other | Attending: Cardiology | Admitting: Cardiology

## 2023-04-03 ENCOUNTER — Encounter: Payer: Self-pay | Admitting: Cardiology

## 2023-04-03 VITALS — BP 138/96 | HR 78 | Ht 68.0 in | Wt 194.8 lb

## 2023-04-03 DIAGNOSIS — I10 Essential (primary) hypertension: Secondary | ICD-10-CM | POA: Insufficient documentation

## 2023-04-03 DIAGNOSIS — I4729 Other ventricular tachycardia: Secondary | ICD-10-CM | POA: Insufficient documentation

## 2023-04-03 MED ORDER — VALSARTAN 160 MG PO TABS: 160.0000 mg | ORAL_TABLET | Freq: Every day | ORAL | 3 refills | Status: AC

## 2023-04-03 NOTE — Progress Notes (Signed)
Cardiology Office Note:    Date:  04/03/2023   ID:  Mary Wise, Mary Wise 04/15/1951, MRN [id number]  PCP:  Mary Giovanni, MD  Cardiologist:  Mary Ripple, DO  Electrophysiologist:  None   Referring MD: Mary Giovanni, MD   Chief Complaint  Patient presents with   Leg Swelling   Edema    In hands     History of Present Illness:    Mary Wise is a 72 y.o. female with a hx of hypertension, hyperlipidemia, NSVT, PAT history of breast cancer status postlumpectomy chemo and radiation, anxiety here today for follow-up visit.   At her last visit, I switched her losartan to valsartan and continue her Cardizem.   She has been doing well.  Past Medical History:  Diagnosis Date   Anxiety    Asthma    Cancer (HCC)    left breast cancer   Family history of cancer    Genetic testing 02/01/2018   Multi-Cancer panel (83 genes) @ [facility] - No pathogenic mutations detected   GERD (gastroesophageal reflux disease)    Hemorrhoids    History of radiation therapy 05/27/18- 06/23/18   Left Breast 40.05 Gy in 15 fractions, left breast boost 10 Gy in 5 fractions.    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    Past Surgical History:  Procedure Laterality Date   ABDOMINAL HYSTERECTOMY     benign spine tumor  2022   BREAST LUMPECTOMY Left 2018   CATARACT EXTRACTION Bilateral    CESAREAN SECTION     CHOLECYSTECTOMY     COLONOSCOPY WITH PROPOFOL N/A 01/15/2021   Procedure: COLONOSCOPY WITH PROPOFOL;  Surgeon: Dolores Frame, MD;  [facility]: [facility];  Service: Gastroenterology;  Laterality: N/A;  10:00   ERCP     ESOPHAGEAL DILATION N/A 04/10/2021   Procedure: ESOPHAGEAL DILATION;  Surgeon: Malissa Hippo, MD;  [facility]: [facility];  Service: Endoscopy;  Laterality: N/A;   ESOPHAGOGASTRODUODENOSCOPY (EGD) WITH PROPOFOL N/A 04/10/2021   Procedure: ESOPHAGOGASTRODUODENOSCOPY (EGD) WITH  PROPOFOL;  Surgeon: Malissa Hippo, MD;  [facility];  Service: Endoscopy;  Laterality: N/A;  10:55   EXCISION OF SKIN TAG N/A 11/14/2022   Procedure: EXCISION OF SKIN TAG;  Surgeon: Lucretia Roers, MD;  Location: [facility];  Service: General;  Laterality: N/A;   IR FLUORO GUIDE PORT INSERTION RIGHT  10/05/2017   IR REMOVAL TUN ACCESS W/ PORT W/O FL MOD SED  05/13/2018   IR US GUIDE VASC ACCESS RIGHT  10/05/2017   LIPOMA EXCISION Left 03/16/2018   left shoulder   LIVER SURGERY     MASS EXCISION N/A 03/30/2020   Procedure: EXCISION OF SEBACEOUS CYST ON LEFT NECK, SEBACEOUSE CYST ON BACK AND ATYPICAL MOLE ON RIGHT NECK, AND LEFT AXILLA SKIN TAG;  Surgeon: Lucretia Roers, MD;  Location: [facility];  Service: General;  Laterality: N/A;   MASS EXCISION Right 11/14/2022   Procedure: EXCISION MASS, NEOPLASM FOREARM;  Surgeon: Lucretia Roers, MD;  Location: [facility];  Service: General;  Laterality: Right;   MOLE REMOVAL Left 11/14/2022   Procedure: MOLE REMOVAL Left CHEST;  Surgeon: Lucretia Roers, MD;  Location: [facility];  Service: General;  Laterality: Left;   PARTIAL MASTECTOMY WITH AXILLARY SENTINEL LYMPH NODE BIOPSY Left 07/31/2017   Procedure: LEFT PARTIAL MASTECTOMY WITH AXILLARY SENTINEL LYMPH  NODE BIOPSY;  Surgeon: Lucretia Roers, MD;  Location: [facility];  Service: General;  Laterality: Left;   POLYPECTOMY  01/15/2021   Procedure: POLYPECTOMY;  Surgeon: Dolores Frame, MD;  [facility];  Service: Gastroenterology;;   TOTAL KNEE ARTHROPLASTY Left 07/05/2019   Procedure: TOTAL KNEE ARTHROPLASTY;  Surgeon: Sheral Apley, MD;  Location: [facility];  Service: Orthopedics;  Laterality: Left;    Current Medications: Current Meds  Medication Sig   albuterol (PROVENTIL) (2.5 MG/3ML) 0.083% nebulizer solution Take 3 mLs (2.5 mg total) by nebulization every 6 (six) hours as needed for wheezing or shortness of breath.   albuterol (VENTOLIN HFA) 108 (90  Base) MCG/ACT inhaler INHALE 2 PUFFS BY MOUTH EVERY 6 HOURS AS NEEDED FOR WHEEZING FOR SHORTNESS OF BREATH   ALPRAZolam (XANAX) 1 MG tablet Take 1 tablet (1 mg total) by mouth 2 (two) times daily as needed. for anxiety (Patient taking differently: Take 1 mg by mouth daily.)   diltiazem (CARDIZEM CD) 180 MG 24 hr capsule Take 1 capsule (180 mg total) by mouth daily.   fluticasone (FLOVENT HFA) 110 MCG/ACT inhaler Inhale 1 puff into the lungs 2 (two) times daily.   ondansetron (ZOFRAN) 4 MG tablet Take 1 tablet (4 mg total) by mouth every 8 (eight) hours as needed for nausea or vomiting.   Oxycodone HCl 10 MG TABS Take 10 mg by mouth 3 (three) times daily as needed (bone pain).   pantoprazole (PROTONIX) 40 MG tablet Take 40 mg by mouth daily.   SUMAtriptan (IMITREX) 100 MG tablet May repeat in 2 hours if headache persists or recurs. (Patient taking differently: Take 100 mg by mouth every 2 (two) hours as needed for migraine (May repeat in 2 hours if headache persists or recurs.).)   tiZANidine (ZANAFLEX) 4 MG tablet Take 1 tablet (4 mg total) by mouth every 8 (eight) hours as needed for muscle spasms. (Patient taking differently: Take 4 mg by mouth in the morning and at bedtime.)   valsartan (DIOVAN) 160 MG tablet Take 1 tablet (160 mg total) by mouth daily.   zolpidem (AMBIEN) 10 MG tablet Take 1 tablet (10 mg total) by mouth at bedtime as needed. for sleep (Patient taking differently: Take 10 mg by mouth at bedtime. for sleep)   [DISCONTINUED] valsartan (DIOVAN) 80 MG tablet Take 1 tablet (80 mg total) by mouth daily.     Allergies:   Onion, Penicillins, Sulfa antibiotics, Yellow dye, Tape, Clindamycin/lincomycin, Latex, Lipitor [atorvastatin calcium], and Tetracycline   Social History   Socioeconomic History   Marital status: Single    Spouse name: Not on file   Number of children: Not on file   Years of education: Not on file   Highest education level: Not on file  Occupational History    Not on file  Tobacco Use   Smoking status: Never    Passive exposure: Current   Smokeless tobacco: Never  Vaping Use   Vaping status: Never Used  Substance and Sexual Activity   Alcohol use: No   Drug use: Never   Sexual activity: Never  Other Topics Concern   Not on file  Social History Narrative   Lives in [facility]isted [facility] on [address].    Social Determinants of Health   Financial Resource Strain: Not on file  Food Insecurity: Not on file  Transportation Needs: No Transportation Needs (07/23/2018)   PRAPARE - Transportation    Lack of Transportation (Medical): No    Lack  of Transportation (Non-Medical): No  Physical Activity: Not on file  Stress: Not on file  Social Connections: Not on file     Family History: The patient's family history includes Breast cancer in her maternal grandmother and mother; Cancer in her paternal aunt, paternal grandfather, paternal grandmother, paternal uncle, paternal uncle, paternal uncle, and another family member; Cancer (age of onset: 48) in her mother; Cancer (age of onset: 38) in her father; Diabetes in her brother, sister, sister, and sister; Lung cancer in her maternal aunt; Melanoma in her mother and another family member; Ovarian cancer (age of onset: 43) in her sister; Pancreatic cancer in her sister; Prostate cancer in her brother; Scoliosis in her daughter; Stomach cancer in her cousin; Stomach cancer (age of onset: 64) in her maternal aunt; Vision loss in her sister.  ROS:   Review of Systems  Constitution: Negative for decreased appetite, fever and weight gain.  HENT: Negative for congestion, ear discharge, hoarse voice and sore throat.   Eyes: Negative for discharge, redness, vision loss in right eye and visual halos.  Cardiovascular: Negative for chest pain, dyspnea on exertion, leg swelling, orthopnea and palpitations.  Respiratory: Negative for cough, hemoptysis, shortness of breath and snoring.   Endocrine: Negative for heat  intolerance and polyphagia.  Hematologic/Lymphatic: Negative for bleeding problem. Does not bruise/bleed easily.  Skin: Negative for flushing, nail changes, rash and suspicious lesions.  Musculoskeletal: Negative for arthritis, joint pain, muscle cramps, myalgias, neck pain and stiffness.  Gastrointestinal: Negative for abdominal pain, bowel incontinence, diarrhea and excessive appetite.  Genitourinary: Negative for decreased libido, genital sores and incomplete emptying.  Neurological: Negative for brief paralysis, focal weakness, headaches and loss of balance.  Psychiatric/Behavioral: Negative for altered mental status, depression and suicidal ideas.  Allergic/Immunologic: Negative for HIV exposure and persistent infections.    EKGs/Labs/Other Studies Reviewed:    The following studies were reviewed today:   EKG:  The ekg ordered today demonstrates Normal sinus rhythm HR 78 bpm  Recent Labs: 08/25/2022: ALT 20; BUN 12; Creatinine 0.83; Hemoglobin 13.5; Platelet Count 298; Potassium 3.8; Sodium 139  Recent Lipid Panel    Component Value Date/Time   CHOL 229 (H) 09/10/2020 1428   TRIG 115 09/10/2020 1428   HDL 59 09/10/2020 1428   CHOLHDL 3.9 09/10/2020 1428   LDLCALC 147 (H) 09/10/2020 1428    Physical Exam:    VS:  BP (!) 138/96 (BP Location: Left Arm, Patient Position: Sitting, Cuff Size: Normal)   Pulse 78   Ht 5\' 8"  (1.727 m)   Wt 194 lb 12.8 oz (88.4 kg)   SpO2 92%   BMI 29.62 kg/m     Wt Readings from Last 3 Encounters:  04/03/23 194 lb 12.8 oz (88.4 kg)  02/10/23 196 lb (88.9 kg)  01/15/23 200 lb 6.4 oz (90.9 kg)     GEN: Well nourished, well developed in no acute distress HEENT: Normal NECK: No JVD; No carotid bruits LYMPHATICS: No lymphadenopathy CARDIAC: S1S2 noted,RRR, no murmurs, rubs, gallops RESPIRATORY:  Clear to auscultation without rales, wheezing or rhonchi  ABDOMEN: Soft, non-tender, non-distended, +bowel sounds, no guarding. EXTREMITIES: No  edema, No cyanosis, no clubbing MUSCULOSKELETAL:  No deformity  SKIN: Warm and dry NEUROLOGIC:  Alert and oriented x 3, non-focal PSYCHIATRIC:  Normal affect, good insight  ASSESSMENT:    1. NSVT (nonsustained ventricular tachycardia) (HCC)   2. Primary hypertension    PLAN:     She is hypertensive in the office today, will increase her  valsartan to 160 mg daily.  Continue her cardizem  The patient is in agreement with the above plan. The patient left the office in stable condition.  The patient will follow up in   Medication Adjustments/Labs and Tests Ordered: Current medicines are reviewed at length with the patient today.  Concerns regarding medicines are outlined above.  Orders Placed This Encounter  Procedures   EKG 12-Lead   Meds ordered this encounter  Medications   valsartan (DIOVAN) 160 MG tablet    Sig: Take 1 tablet (160 mg total) by mouth daily.    Dispense:  90 tablet    Refill:  3    Discontinue 80 mg dose    Patient Instructions  Medication Instructions:    Increase dose of Valsartan 160 mg  daily  ( take  2 tablets of 80 mg of current bottle daily to complete the bottle,before starting the new prescription.  *If you need a refill on your cardiac medications before your next appointment, please call your pharmacy*   Lab Work:   Not needed    Testing/Procedures: Not needed   Follow-Up: At [facility], you and your health needs are our priority.  As part of our continuing mission to provide you with exceptional heart care, we have created designated Provider Care Teams.  These Care Teams include your primary Cardiologist (physician) and Advanced Practice Providers (APPs -  Physician Assistants and Nurse Practitioners) who all work together to provide you with the care you need, when you need it.  We recommend signing up for the patient portal called "MyChart".  Sign up information is provided on this After Visit Summary.  MyChart is used to  connect with patients for Virtual Visits (Telemedicine).  Patients are able to view lab/test results, encounter notes, upcoming appointments, etc.  Non-urgent messages can be sent to your provider as well.   To learn more about what you can do with MyChart, go to [email or web address].    Your next appointment:   6 month(s)  The format for your next appointment:   In Person  Provider:   Thomasene Ripple, DO      Adopting a Healthy Lifestyle.  Know what a healthy weight is for you (roughly BMI <25) and aim to maintain this   Aim for 7+ servings of fruits and vegetables daily   65-80+ fluid ounces of water or unsweet tea for healthy kidneys   Limit to max 1 drink of alcohol per day; avoid smoking/tobacco   Limit animal fats in diet for cholesterol and heart health - choose grass fed whenever available   Avoid highly processed foods, and foods high in saturated/trans fats   Aim for low stress - take time to unwind and care for your mental health   Aim for 150 min of moderate intensity exercise weekly for heart health, and weights twice weekly for bone health   Aim for 7-9 hours of sleep daily   When it comes to diets, agreement about the perfect plan isnt easy to find, even among the experts. Experts at the [facility] [employer] developed an idea known as the Healthy Eating Plate. Just imagine a plate divided into logical, healthy portions.   The emphasis is on diet quality:   Load up on vegetables and fruits - one-half of your plate: Aim for color and variety, and remember that potatoes dont count.   Go for whole grains - one-quarter of your plate: Whole wheat, barley, wheat berries, quinoa, oats, brown  rice, and foods made with them. If you want pasta, go with whole wheat pasta.   Protein power - one-quarter of your plate: Fish, chicken, beans, and nuts are all healthy, versatile protein sources. Limit red meat.   The diet, however, does go beyond the plate,  offering a few other suggestions.   Use healthy plant oils, such as olive, canola, soy, corn, sunflower and peanut. Check the labels, and avoid partially hydrogenated oil, which have unhealthy trans fats.   If youre thirsty, drink water. Coffee and tea are good in moderation, but skip sugary drinks and limit milk and dairy products to one or two daily servings.   The type of carbohydrate in the diet is more important than the amount. Some sources of carbohydrates, such as vegetables, fruits, whole grains, and beans-are healthier than others.   Finally, stay active  Signed, Mary Ripple, DO  04/03/2023 8:18 PM    [facility]

## 2023-04-03 NOTE — Patient Instructions (Addendum)
Medication Instructions:    Increase dose of Valsartan 160 mg  daily  ( take  2 tablets of 80 mg of current bottle daily to complete the bottle,before starting the new prescription.  *If you need a refill on your cardiac medications before your next appointment, please call your pharmacy*   Lab Work:   Not needed    Testing/Procedures: Not needed   Follow-Up: At [facility], you and your health needs are our priority.  As part of our continuing mission to provide you with exceptional heart care, we have created designated Provider Care Teams.  These Care Teams include your primary Cardiologist (physician) and Advanced Practice Providers (APPs -  Physician Assistants and Nurse Practitioners) who all work together to provide you with the care you need, when you need it.  We recommend signing up for the patient portal called "MyChart".  Sign up information is provided on this After Visit Summary.  MyChart is used to connect with patients for Virtual Visits (Telemedicine).  Patients are able to view lab/test results, encounter notes, upcoming appointments, etc.  Non-urgent messages can be sent to your provider as well.   To learn more about what you can do with MyChart, go to [email or web address].    Your next appointment:   6 month(s)  The format for your next appointment:   In Person  Provider:   Thomasene Ripple, DO

## 2023-04-10 ENCOUNTER — Encounter: Payer: Self-pay | Admitting: Family Medicine

## 2023-04-10 ENCOUNTER — Ambulatory Visit (INDEPENDENT_AMBULATORY_CARE_PROVIDER_SITE_OTHER): Payer: Medicare Other | Admitting: Family Medicine

## 2023-04-10 VITALS — BP 148/82 | HR 88 | Ht 68.0 in | Wt 198.1 lb

## 2023-04-10 DIAGNOSIS — M5030 Other cervical disc degeneration, unspecified cervical region: Secondary | ICD-10-CM | POA: Diagnosis not present

## 2023-04-10 DIAGNOSIS — Z1211 Encounter for screening for malignant neoplasm of colon: Secondary | ICD-10-CM | POA: Diagnosis not present

## 2023-04-10 DIAGNOSIS — Z1159 Encounter for screening for other viral diseases: Secondary | ICD-10-CM

## 2023-04-10 DIAGNOSIS — E559 Vitamin D deficiency, unspecified: Secondary | ICD-10-CM

## 2023-04-10 DIAGNOSIS — R7301 Impaired fasting glucose: Secondary | ICD-10-CM | POA: Diagnosis not present

## 2023-04-10 DIAGNOSIS — E038 Other specified hypothyroidism: Secondary | ICD-10-CM

## 2023-04-10 DIAGNOSIS — E7849 Other hyperlipidemia: Secondary | ICD-10-CM

## 2023-04-10 DIAGNOSIS — Z114 Encounter for screening for human immunodeficiency virus [HIV]: Secondary | ICD-10-CM

## 2023-04-10 MED ORDER — OXYCODONE HCL 10 MG PO TABS
10.0000 mg | ORAL_TABLET | Freq: Three times a day (TID) | ORAL | 0 refills | Status: DC | PRN
Start: 2023-04-10 — End: 2023-05-20

## 2023-04-10 NOTE — Assessment & Plan Note (Signed)
The patient is encouraged to apply heat or cold therapy to the affected site as needed. She is advised to follow up with physical therapy and orthopedic surgery if necessary. A refill for oxycodone 10 mg, to be taken as needed for pain relief, has been provided. Additionally, the patient is encouraged to continue regular follow-up with her rheumatologist and spine and scoliosis provider.

## 2023-04-10 NOTE — Progress Notes (Signed)
New Patient Office Visit  Subjective:  Patient ID: Mary Wise, female    DOB: April 29, 1951  Age: 72 y.o. MRN: [id number]  CC:  Chief Complaint  Patient presents with   New Patient (Initial Visit)    Establishing care, sees rheumatology and spine and scoliosis specialist, needs xray orders for neck pain and inflammation.     HPI Mary Wise is a 72 y.o. female with past medical history of primary osteoarthritis of both hands, bilateral carpal tunnel syndrome, primary osteoarthritis of both feet, degenerative disc disease of the cervical spine, degenerative disc disease of the lumbar spine, fibromyalgia, GERD, hyperlipidemia, hypertension, mild intermittent asthma without complication, chronic migraines without aura presents for establishing care.  Degenerative disc disease of the cervical spine: The patient followed up with rheumatology on February 10, 2023, presenting with complaints of limited range of motion in the cervical spine. Imaging studies revealed significant narrowing at C5-C6 and C6-C7, along with facet joint arthropathy. X-ray results were discussed with the patient, and she was provided with a handout on recommended exercises. A referral was made to physical therapy and orthopedics.  The patient reported receiving a call from physical therapy but declined to schedule an appointment, stating that she will pursue physical therapy as needed. She also has not yet followed up with orthopedic surgery, as her spine and scoliosis provider advised against injections, braces, massages, and needles, and noted the presence of a bone spur on her spine. The patient is currently using oxycodone 10 mg as needed for pain relief and has requested a refill today.        Past Medical History:  Diagnosis Date   Anxiety    Asthma    Cancer (HCC)    left breast cancer   Family history of cancer    Genetic testing 02/01/2018   Multi-Cancer panel (83 genes) @ [facility] - No pathogenic mutations  detected   GERD (gastroesophageal reflux disease)    Hemorrhoids    History of radiation therapy 05/27/18- 06/23/18   Left Breast 40.05 Gy in 15 fractions, left breast boost 10 Gy in 5 fractions.    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    Past Surgical History:  Procedure Laterality Date   ABDOMINAL HYSTERECTOMY     benign spine tumor  2022   BREAST LUMPECTOMY Left 2018   CATARACT EXTRACTION Bilateral    CESAREAN SECTION     CHOLECYSTECTOMY     COLONOSCOPY WITH PROPOFOL N/A 01/15/2021   Procedure: COLONOSCOPY WITH PROPOFOL;  Surgeon: Dolores Frame, MD;  Location: [facility];  Service: Gastroenterology;  Laterality: N/A;  10:00   ERCP     ESOPHAGEAL DILATION N/A 04/10/2021   Procedure: ESOPHAGEAL DILATION;  Surgeon: Malissa Hippo, MD;  Location: [facility];  Service: Endoscopy;  Laterality: N/A;   ESOPHAGOGASTRODUODENOSCOPY (EGD) WITH PROPOFOL N/A 04/10/2021   Procedure: ESOPHAGOGASTRODUODENOSCOPY (EGD) WITH PROPOFOL;  Surgeon: Malissa Hippo, MD;  [facility]: [facility];  Service: Endoscopy;  Laterality: N/A;  10:55   EXCISION OF SKIN TAG N/A 11/14/2022   Procedure: EXCISION OF SKIN TAG;  Surgeon: Lucretia Roers, MD;  Location: [facility];  Service: General;  Laterality: N/A;   IR FLUORO GUIDE PORT INSERTION RIGHT  10/05/2017   IR REMOVAL TUN ACCESS W/ PORT W/O FL MOD SED  05/13/2018   IR US  GUIDE VASC ACCESS RIGHT  10/05/2017   LIPOMA EXCISION Left 03/16/2018   left shoulder   LIVER SURGERY     MASS EXCISION N/A 03/30/2020   Procedure: EXCISION OF SEBACEOUS CYST ON LEFT NECK, SEBACEOUSE CYST ON BACK AND ATYPICAL MOLE ON RIGHT NECK, AND LEFT AXILLA SKIN TAG;  Surgeon: Lucretia Roers, MD;  Location: [facility];  Service: General;  Laterality: N/A;   MASS EXCISION Right 11/14/2022   Procedure: EXCISION MASS, NEOPLASM FOREARM;  Surgeon: Lucretia Roers, MD;  Location: [facility];   Service: General;  Laterality: Right;   MOLE REMOVAL Left 11/14/2022   Procedure: MOLE REMOVAL Left CHEST;  Surgeon: Lucretia Roers, MD;  Location: [facility];  Service: General;  Laterality: Left;   PARTIAL MASTECTOMY WITH AXILLARY SENTINEL LYMPH NODE BIOPSY Left 07/31/2017   Procedure: LEFT PARTIAL MASTECTOMY WITH AXILLARY SENTINEL LYMPH NODE BIOPSY;  Surgeon: Lucretia Roers, MD;  Location: [facility];  Service: General;  Laterality: Left;   POLYPECTOMY  01/15/2021   Procedure: POLYPECTOMY;  Surgeon: Dolores Frame, MD;  [facility];  Service: Gastroenterology;;   TOTAL KNEE ARTHROPLASTY Left 07/05/2019   Procedure: TOTAL KNEE ARTHROPLASTY;  Surgeon: Sheral Apley, MD;  Location: [facility];  Service: Orthopedics;  Laterality: Left;    Family History  Problem Relation Age of Onset   Cancer Mother 59       unk. primary; deceased 64   Breast cancer Mother    Melanoma Mother    Cancer Father 28       brain cancer; deceased 17   Ovarian cancer Sister 55       deceased 12   Pancreatic cancer Sister    Diabetes Sister    Vision loss Sister    Diabetes Sister    Diabetes Sister    Diabetes Brother    Prostate cancer Brother    Stomach cancer Maternal Aunt 46       deceased 33   Lung cancer Maternal Aunt    Cancer Paternal Aunt        ink. primary in 2 aunts   Cancer Paternal Uncle        brain cancer    Cancer Paternal Uncle        brain tumor    Cancer Paternal Uncle        brain tumor    Breast cancer Maternal Grandmother        dx 26s; deceased 21   Cancer Paternal Grandmother        "female ca"   Cancer Paternal Grandfather        lung cancer   Stomach cancer Cousin        2 sons of an unaffected maternal uncle   Cancer Other        "female ca"   Melanoma Other    Scoliosis Daughter     Social History   Socioeconomic History   Marital status: Single    Spouse name: Not on file   Number of children: Not on file   Years of education: Not  on file   Highest education level: Not on file  Occupational History   Not on file  Tobacco Use   Smoking status: Never    Passive exposure: Current   Smokeless tobacco: Never  Vaping Use   Vaping status: Never Used  Substance and Sexual Activity   Alcohol use: No   Drug use: Never   Sexual activity: Never  Other  Topics Concern   Not on file  Social History Narrative   Lives in Assisted Living on [address].    Social Determinants of Health   Financial Resource Strain: Not on file  Food Insecurity: Not on file  Transportation Needs: No Transportation Needs (07/23/2018)   PRAPARE - Administrator, Civil Service (Medical): No    Lack of Transportation (Non-Medical): No  Physical Activity: Not on file  Stress: Not on file  Social Connections: Not on file  Intimate Partner Violence: Not At Risk (03/23/2018)   Humiliation, Afraid, Rape, and Kick questionnaire    Fear of Current or Ex-Partner: No    Emotionally Abused: No    Physically Abused: No    Sexually Abused: No    ROS Review of Systems  Constitutional:  Negative for chills and fever.  Eyes:  Negative for visual disturbance.  Respiratory:  Negative for chest tightness and shortness of breath.   Musculoskeletal:  Positive for arthralgias.  Neurological:  Negative for dizziness and headaches.    Objective:   Today's Vitals: BP (!) 148/82 (BP Location: Left Arm)   Pulse 88   Ht 5\' 8"  (1.727 m)   Wt 198 lb 1.9 oz (89.9 kg)   SpO2 96%   BMI 30.12 kg/m   Physical Exam HENT:     Head: Normocephalic.     Mouth/Throat:     Mouth: Mucous membranes are moist.  Cardiovascular:     Rate and Rhythm: Normal rate.     Heart sounds: Normal heart sounds.  Pulmonary:     Effort: Pulmonary effort is normal.     Breath sounds: Normal breath sounds.  Neurological:     Mental Status: She is alert.      Assessment & Plan:   DDD (degenerative disc disease), cervical Assessment & Plan: The patient is  encouraged to apply heat or cold therapy to the affected site as needed. She is advised to follow up with physical therapy and orthopedic surgery if necessary. A refill for oxycodone 10 mg, to be taken as needed for pain relief, has been provided. Additionally, the patient is encouraged to continue regular follow-up with her rheumatologist and spine and scoliosis provider.  Orders: -     oxyCODONE HCl; Take 1 tablet (10 mg total) by mouth 3 (three) times daily as needed (bone pain).  Dispense: 60 tablet; Refill: 0  Colon cancer screening -     Cologuard  IFG (impaired fasting glucose) -     Hemoglobin A1c  Vitamin D deficiency -     VITAMIN D 25 Hydroxy (Vit-D Deficiency, Fractures)  Need for hepatitis C screening test -     Hepatitis C antibody  Encounter for screening for HIV  Other specified hypothyroidism -     TSH + free T4  Other hyperlipidemia -     Lipid panel -     CMP14+EGFR -     CBC with Differential/Platelet   Note: This chart has been completed using Engineer, civil (consulting) software, and while attempts have been made to ensure accuracy, certain words and phrases may not be transcribed as intended.    Follow-up: Return in about 4 months (around 08/10/2023).   Gilmore Laroche, FNP

## 2023-04-10 NOTE — Patient Instructions (Addendum)
  I appreciate the opportunity to provide care to you today!    Follow up:  4 months  Labs: please stop by the lab today to get your blood drawn (CBC, CMP, TSH, Lipid profile, HgA1c, Vit D)  Screening: Hep C  Please schedule an AWV  Cervical degenerative disc disease (DDD) -The x-ray conducted on January 15, 2023, has been reviewed by Dr. Corliss Skains. - Please continue with your follow-up appointments with both rheumatology and the spine specialist for ongoing assessment and management. -I recommend contacting a physical therapy provider to schedule an appointment when you feel ready, as physical therapy can be beneficial in managing your condition.  -Continue taking oxycodone 10 mg as needed for pain relief. -Heat and Cold Therapy: Applying heat or cold packs can provide temporary relief from pain and inflammation. Heat therapy can help relax tight muscles, while cold therapy can reduce swelling.    Please continue to a heart-healthy diet and increase your physical activities. Try to exercise for at least five days a week.    It was a pleasure to see you and I look forward to continuing to work together on your health and well-being. Please do not hesitate to call the office if you need care or have questions about your care.  In case of emergency, please visit the Emergency Department for urgent care, or contact our clinic at [phone] to schedule an appointment. We're here to help you!   Have a wonderful day and week. With Gratitude, Gilmore Laroche MSN, FNP-BC

## 2023-04-11 LAB — CBC WITH DIFFERENTIAL/PLATELET
Basophils Absolute: 0.1 10*3/uL (ref 0.0–0.2)
Basos: 1 %
EOS (ABSOLUTE): 0.2 10*3/uL (ref 0.0–0.4)
Eos: 2 %
Hematocrit: 39.6 % (ref 34.0–46.6)
Hemoglobin: 12.8 g/dL (ref 11.1–15.9)
Immature Grans (Abs): 0 10*3/uL (ref 0.0–0.1)
Immature Granulocytes: 0 %
Lymphocytes Absolute: 2.3 10*3/uL (ref 0.7–3.1)
Lymphs: 32 %
MCH: 29.2 pg (ref 26.6–33.0)
MCHC: 32.3 g/dL (ref 31.5–35.7)
MCV: 90 fL (ref 79–97)
Monocytes Absolute: 0.5 10*3/uL (ref 0.1–0.9)
Monocytes: 6 %
Neutrophils Absolute: 4.2 10*3/uL (ref 1.4–7.0)
Neutrophils: 59 %
Platelets: 300 10*3/uL (ref 150–450)
RBC: 4.38 x10E6/uL (ref 3.77–5.28)
RDW: 12.9 % (ref 11.7–15.4)
WBC: 7.2 10*3/uL (ref 3.4–10.8)

## 2023-04-11 LAB — CMP14+EGFR
ALT: 16 IU/L (ref 0–32)
AST: 19 IU/L (ref 0–40)
Albumin: 4.3 g/dL (ref 3.8–4.8)
Alkaline Phosphatase: 109 IU/L (ref 44–121)
BUN/Creatinine Ratio: 17 (ref 12–28)
BUN: 13 mg/dL (ref 8–27)
Bilirubin Total: 0.2 mg/dL (ref 0.0–1.2)
CO2: 25 mmol/L (ref 20–29)
Calcium: 9.6 mg/dL (ref 8.7–10.3)
Chloride: 101 mmol/L (ref 96–106)
Creatinine, Ser: 0.76 mg/dL (ref 0.57–1.00)
Globulin, Total: 2.7 g/dL (ref 1.5–4.5)
Glucose: 90 mg/dL (ref 70–99)
Potassium: 3.8 mmol/L (ref 3.5–5.2)
Sodium: 141 mmol/L (ref 134–144)
Total Protein: 7 g/dL (ref 6.0–8.5)
eGFR: 84 mL/min/{1.73_m2} (ref >59)

## 2023-04-11 LAB — LIPID PANEL
Chol/HDL Ratio: 3.2 ratio (ref 0.0–4.4)
Cholesterol, Total: 207 mg/dL — ABNORMAL HIGH (ref 100–199)
HDL: 65 mg/dL (ref >39)
LDL Chol Calc (NIH): 127 mg/dL — ABNORMAL HIGH (ref 0–99)
Triglycerides: 86 mg/dL (ref 0–149)
VLDL Cholesterol Cal: 15 mg/dL (ref 5–40)

## 2023-04-11 LAB — HEMOGLOBIN A1C
Est. average glucose Bld gHb Est-mCnc: 117 mg/dL
Hgb A1c MFr Bld: 5.7 % — ABNORMAL HIGH (ref 4.8–5.6)

## 2023-04-11 LAB — VITAMIN D 25 HYDROXY (VIT D DEFICIENCY, FRACTURES): Vit D, 25-Hydroxy: 20.9 ng/mL — ABNORMAL LOW (ref 30.0–100.0)

## 2023-04-11 LAB — TSH+FREE T4
Free T4: 1.06 ng/dL (ref 0.82–1.77)
TSH: 3.62 u[IU]/mL (ref 0.450–4.500)

## 2023-04-11 LAB — HEPATITIS C ANTIBODY: Hep C Virus Ab: NONREACTIVE

## 2023-04-12 ENCOUNTER — Other Ambulatory Visit: Payer: Self-pay | Admitting: Family Medicine

## 2023-04-12 DIAGNOSIS — E559 Vitamin D deficiency, unspecified: Secondary | ICD-10-CM

## 2023-04-12 DIAGNOSIS — E785 Hyperlipidemia, unspecified: Secondary | ICD-10-CM

## 2023-04-12 MED ORDER — VITAMIN D3 50 MCG (2000 UT) PO CAPS
2000.0000 [IU] | ORAL_CAPSULE | Freq: Every day | ORAL | 1 refills | Status: AC
Start: 2023-04-12 — End: ?

## 2023-04-12 MED ORDER — PRAVASTATIN SODIUM 20 MG PO TABS
20.0000 mg | ORAL_TABLET | Freq: Every day | ORAL | 0 refills | Status: AC
Start: 2023-04-12 — End: ?

## 2023-04-12 NOTE — Progress Notes (Signed)
Please inform the patient that her vitamin D levels are low, and a daily supplement of 2000 units has been prescribed and sent to her pharmacy for her to begin taking. Her lab results also indicate she is prediabetic; therefore, I recommend she decrease her intake of high-sugar foods and beverages and increase her physical activity to manage her blood sugar levels. Additionally, a prescription for pravastatin 20 mg has been sent to her pharmacy to address her elevated cholesterol levels, which are not currently at the desired goal. To further improve her health, I advise making lifestyle changes that include reducing her intake of greasy, fatty, and starchy foods and increasing her physical activity.

## 2023-04-13 ENCOUNTER — Telehealth: Payer: Self-pay | Admitting: Family Medicine

## 2023-04-13 NOTE — Telephone Encounter (Signed)
Patient calling states she got a message saying there is an order for her to take a cologaurd- wants it cancelled said she is a cancer patient so she has to have colonoscopies. Please advise Thank you

## 2023-04-13 NOTE — Telephone Encounter (Signed)
Noted  

## 2023-04-17 ENCOUNTER — Other Ambulatory Visit: Payer: Self-pay | Admitting: Family Medicine

## 2023-04-17 ENCOUNTER — Telehealth: Payer: Self-pay | Admitting: Family Medicine

## 2023-04-17 NOTE — Telephone Encounter (Signed)
Patient is calling needing a refill on Oxycodone HCl 10 MG TABS [[id number]] and is needing Amoxicillin called in as well. Please advise [facility] in   Thank you

## 2023-04-23 ENCOUNTER — Ambulatory Visit: Payer: Medicare Other | Admitting: Family Medicine

## 2023-05-05 ENCOUNTER — Other Ambulatory Visit: Payer: Self-pay | Admitting: Otolaryngology

## 2023-05-05 DIAGNOSIS — R1314 Dysphagia, pharyngoesophageal phase: Secondary | ICD-10-CM

## 2023-05-19 ENCOUNTER — Telehealth: Payer: Self-pay | Admitting: Family Medicine

## 2023-05-19 NOTE — Telephone Encounter (Signed)
Prescription Request  05/19/2023  LOV: 04/10/2023  What is the name of the medication or equipment? Oxycodone HCl 10 MG TABS [[id number]]   Have you contacted your pharmacy to request a refill? No   Which pharmacy would you like this sent to?  [facility] [address] [place] - [address] [place] [address] Phone: [phone] Fax: [phone]    Patient notified that their request is being sent to the clinical staff for review and that they should receive a response within 2 business days.   Please advise at Mobile [phone] (mobile)

## 2023-05-20 ENCOUNTER — Other Ambulatory Visit: Payer: Self-pay | Admitting: Family Medicine

## 2023-05-20 DIAGNOSIS — M5030 Other cervical disc degeneration, unspecified cervical region: Secondary | ICD-10-CM

## 2023-05-20 MED ORDER — OXYCODONE HCL 10 MG PO TABS
10.0000 mg | ORAL_TABLET | Freq: Three times a day (TID) | ORAL | 0 refills | Status: DC | PRN
Start: 2023-05-20 — End: 2023-06-03

## 2023-05-20 NOTE — Telephone Encounter (Signed)
Rx sent and referral has been placed to pain management

## 2023-05-21 ENCOUNTER — Other Ambulatory Visit: Payer: Medicare Other

## 2023-05-26 ENCOUNTER — Ambulatory Visit
Admission: RE | Admit: 2023-05-26 | Discharge: 2023-05-26 | Disposition: A | Discharge status: Home / Self Care | Payer: Medicare Other | Source: Ambulatory Visit | Attending: Otolaryngology | Admitting: Otolaryngology

## 2023-05-26 DIAGNOSIS — R1314 Dysphagia, pharyngoesophageal phase: Secondary | ICD-10-CM

## 2023-05-28 ENCOUNTER — Other Ambulatory Visit: Payer: Self-pay | Admitting: Hematology

## 2023-05-28 DIAGNOSIS — Z1231 Encounter for screening mammogram for malignant neoplasm of breast: Secondary | ICD-10-CM

## 2023-05-28 NOTE — Telephone Encounter (Signed)
Patient is calling says her Rx is wrong says she is supposed to take 2 a day supposed to have 60 pills to last her 30 days. Pt very upset says she is too much for Malachi Bonds too handle. Requesting the rest of her medication says she only got 20 pills. Please advise Thank you

## 2023-05-29 ENCOUNTER — Other Ambulatory Visit: Payer: Self-pay | Admitting: Family Medicine

## 2023-05-29 NOTE — Telephone Encounter (Signed)
Kindly follow up on the referral placed to pain management

## 2023-06-02 ENCOUNTER — Ambulatory Visit (INDEPENDENT_AMBULATORY_CARE_PROVIDER_SITE_OTHER): Payer: Medicare Other

## 2023-06-02 VITALS — Ht 68.0 in | Wt 180.0 lb

## 2023-06-02 DIAGNOSIS — Z0000 Encounter for general adult medical examination without abnormal findings: Secondary | ICD-10-CM | POA: Diagnosis not present

## 2023-06-02 NOTE — Progress Notes (Signed)
Subjective:   Mary Wise is a 72 y.o. female who presents for Medicare Annual (Subsequent) preventive examination.  Visit Complete: Virtual I connected with  Michae Wise on 06/02/23 by a audio enabled telemedicine application and verified that I am speaking with the correct person using two identifiers.  Patient Location: Home  Provider Location: Home Office  I discussed the limitations of evaluation and management by telemedicine. The patient expressed understanding and agreed to proceed.  Vital Signs: Because this visit was a virtual/telehealth visit, some criteria may be missing or patient reported. Any vitals not documented were not able to be obtained and vitals that have been documented are patient reported.  Patient Medicare AWV questionnaire was completed by the patient on 06/02/2023; I have confirmed that all information answered by patient is correct and no changes since this date.  Cardiac Risk Factors include: advanced age (>46men, >85 women);dyslipidemia     Objective:    Today's Vitals   06/02/23 1534  Weight: 180 lb (81.6 kg)  Height: 5\' 8"  (1.727 m)   Body mass index is 27.37 kg/m.     06/02/2023    3:40 PM 11/12/2022    9:20 AM 01/03/2022    2:49 PM 04/10/2021    9:31 AM 04/05/2021    9:35 AM 01/15/2021    8:11 AM 05/21/2020    2:20 PM  Advanced Directives  Does Patient Have a Medical Advance Directive? Yes No No Yes Yes Yes Yes  Type of Estate agent of [place];Living will   Healthcare Power of [place];Living will Healthcare Power of [place];Living will Healthcare Power of [place];Living will   Copy of Healthcare Power of Attorney in Chart? No - copy requested   No - copy requested No - copy requested    Would patient like information on creating a medical advance directive?  No - Patient declined Yes (ED - Information included in AVS)        Current Medications (verified) Outpatient Encounter Medications as of 06/02/2023   Medication Sig   albuterol (PROVENTIL) (2.5 MG/3ML) 0.083% nebulizer solution Take 3 mLs (2.5 mg total) by nebulization every 6 (six) hours as needed for wheezing or shortness of breath.   albuterol (VENTOLIN HFA) 108 (90 Base) MCG/ACT inhaler INHALE 2 PUFFS BY MOUTH EVERY 6 HOURS AS NEEDED FOR WHEEZING FOR SHORTNESS OF BREATH   ALPRAZolam (XANAX) 1 MG tablet Take 1 tablet (1 mg total) by mouth 2 (two) times daily as needed. for anxiety (Patient taking differently: Take 1 mg by mouth daily.)   Cholecalciferol (VITAMIN D3) 50 MCG (2000 UT) capsule Take 1 capsule (2,000 Units total) by mouth daily.   diltiazem (CARDIZEM CD) 180 MG 24 hr capsule Take 1 capsule (180 mg total) by mouth daily.   fluticasone (FLOVENT HFA) 110 MCG/ACT inhaler Inhale 1 puff into the lungs 2 (two) times daily.   Oxycodone HCl 10 MG TABS Take 1 tablet (10 mg total) by mouth 3 (three) times daily as needed (bone pain).   pantoprazole (PROTONIX) 40 MG tablet Take 40 mg by mouth daily.   pravastatin (PRAVACHOL) 20 MG tablet Take 1 tablet (20 mg total) by mouth daily.   SUMAtriptan (IMITREX) 100 MG tablet May repeat in 2 hours if headache persists or recurs. (Patient taking differently: Take 100 mg by mouth every 2 (two) hours as needed for migraine (May repeat in 2 hours if headache persists or recurs.).)   tiZANidine (ZANAFLEX) 4 MG tablet Take 1 tablet (4 mg  total) by mouth every 8 (eight) hours as needed for muscle spasms. (Patient taking differently: Take 4 mg by mouth in the morning and at bedtime.)   valsartan (DIOVAN) 160 MG tablet Take 1 tablet (160 mg total) by mouth daily.   zolpidem (AMBIEN) 10 MG tablet Take 1 tablet (10 mg total) by mouth at bedtime as needed. for sleep (Patient taking differently: Take 10 mg by mouth at bedtime. for sleep)   No facility-administered encounter medications on file as of 06/02/2023.    Allergies (verified) Onion, Penicillins, Sulfa antibiotics, Yellow dye, Tape,  Clindamycin/lincomycin, Latex, Lipitor [atorvastatin calcium], and Tetracycline   History: Past Medical History:  Diagnosis Date   Anxiety    Asthma    Cancer (HCC)    left breast cancer   Family history of cancer    Genetic testing 02/01/2018   Multi-Cancer panel (83 genes) @ [facility] - No pathogenic mutations detected   GERD (gastroesophageal reflux disease)    Hemorrhoids    History of radiation therapy 05/27/18- 06/23/18   Left Breast 40.05 Gy in 15 fractions, left breast boost 10 Gy in 5 fractions.    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   Past Surgical History:  Procedure Laterality Date   ABDOMINAL HYSTERECTOMY     benign spine tumor  2022   BREAST LUMPECTOMY Left 2018   CATARACT EXTRACTION Bilateral    CESAREAN SECTION     CHOLECYSTECTOMY     COLONOSCOPY WITH PROPOFOL N/A 01/15/2021   Procedure: COLONOSCOPY WITH PROPOFOL;  Surgeon: Dolores Frame, MD;  [facility];  Service: Gastroenterology;  Laterality: N/A;  10:00   ERCP     ESOPHAGEAL DILATION N/A 04/10/2021   Procedure: ESOPHAGEAL DILATION;  Surgeon: Malissa Hippo, MD;  [facility];  Service: Endoscopy;  Laterality: N/A;   ESOPHAGOGASTRODUODENOSCOPY (EGD) WITH PROPOFOL N/A 04/10/2021   Procedure: ESOPHAGOGASTRODUODENOSCOPY (EGD) WITH PROPOFOL;  Surgeon: Malissa Hippo, MD;  [facility];  Service: Endoscopy;  Laterality: N/A;  10:55   EXCISION OF SKIN TAG N/A 11/14/2022   Procedure: EXCISION OF SKIN TAG;  Surgeon: Lucretia Roers, MD;  Location: [facility];  Service: General;  Laterality: N/A;   IR FLUORO GUIDE PORT INSERTION RIGHT  10/05/2017   IR REMOVAL TUN ACCESS W/ PORT W/O FL MOD SED  05/13/2018   IR US GUIDE VASC ACCESS RIGHT  10/05/2017   LIPOMA EXCISION Left 03/16/2018   left shoulder   LIVER SURGERY     MASS EXCISION N/A 03/30/2020   Procedure: EXCISION OF SEBACEOUS CYST  ON LEFT NECK, SEBACEOUSE CYST ON BACK AND ATYPICAL MOLE ON RIGHT NECK, AND LEFT AXILLA SKIN TAG;  Surgeon: Lucretia Roers, MD;  Location: [facility];  Service: General;  Laterality: N/A;   MASS EXCISION Right 11/14/2022   Procedure: EXCISION MASS, NEOPLASM FOREARM;  Surgeon: Lucretia Roers, MD;  Location: [facility];  Service: General;  Laterality: Right;   MOLE REMOVAL Left 11/14/2022   Procedure: MOLE REMOVAL Left CHEST;  Surgeon: Lucretia Roers, MD;  Location: [facility];  Service: General;  Laterality: Left;   PARTIAL MASTECTOMY WITH AXILLARY SENTINEL LYMPH NODE BIOPSY Left 07/31/2017   Procedure: LEFT PARTIAL MASTECTOMY WITH AXILLARY SENTINEL LYMPH NODE BIOPSY;  Surgeon: Lucretia Roers, MD;  Location: [facility];  Service: General;  Laterality: Left;   POLYPECTOMY  01/15/2021   Procedure: POLYPECTOMY;  Surgeon: Marguerita Merles, Reuel Boom, MD;  [facility];  Service: Gastroenterology;;   TOTAL KNEE ARTHROPLASTY Left 07/05/2019   Procedure: TOTAL KNEE ARTHROPLASTY;  Surgeon: Sheral Apley, MD;  Location: [facility];  Service: Orthopedics;  Laterality: Left;   Family History  Problem Relation Age of Onset   Cancer Mother 60       unk. primary; deceased 3   Breast cancer Mother    Melanoma Mother    Cancer Father 2       brain cancer; deceased 69   Ovarian cancer Sister 56       deceased 11   Pancreatic cancer Sister    Diabetes Sister    Vision loss Sister    Diabetes Sister    Diabetes Sister    Diabetes Brother    Prostate cancer Brother    Stomach cancer Maternal Aunt 85       deceased 22   Lung cancer Maternal Aunt    Cancer Paternal Aunt        ink. primary in 2 aunts   Cancer Paternal Uncle        brain cancer    Cancer Paternal Uncle        brain tumor    Cancer Paternal Uncle        brain tumor    Breast cancer Maternal Grandmother        dx 9s; deceased 31   Cancer Paternal Grandmother        "female ca"   Cancer Paternal Grandfather        lung  cancer   Stomach cancer Cousin        2 sons of an unaffected maternal uncle   Cancer Other        "female ca"   Melanoma Other    Scoliosis Daughter    Social History   Socioeconomic History   Marital status: Single    Spouse name: Not on file   Number of children: Not on file   Years of education: Not on file   Highest education level: Not on file  Occupational History   Not on file  Tobacco Use   Smoking status: Never    Passive exposure: Current   Smokeless tobacco: Never  Vaping Use   Vaping status: Never Used  Substance and Sexual Activity   Alcohol use: No   Drug use: Never   Sexual activity: Never  Other Topics Concern   Not on file  Social History Narrative   Lives in [facility]isted Living on [address].    Social Determinants of Health   Financial Resource Strain: Low Risk  (06/02/2023)   Overall Financial Resource Strain (CARDIA)    Difficulty of Paying Living Expenses: Not hard at all  Food Insecurity: No Food Insecurity (06/02/2023)   Hunger Vital Sign    Worried About Running Out of Food in the Last Year: Never true    Ran Out of Food in the Last Year: Never true  Transportation Needs: No Transportation Needs (06/02/2023)   PRAPARE - Administrator, Civil Service (Medical): No    Lack of Transportation (Non-Medical): No  Physical Activity: Insufficiently Active (06/02/2023)   Exercise Vital Sign    Days of Exercise per Week: 2 days    Minutes of Exercise per Session: 20 min  Stress: No Stress Concern Present (06/02/2023)   [employer] of Occupational Health - Occupational Stress Questionnaire  Feeling of Stress : Not at all  Social Connections: Socially Isolated (06/02/2023)   Social Connection and Isolation Panel [NHANES]    Frequency of Communication with Friends and Family: More than three times a week    Frequency of Social Gatherings with Friends and Family: More than three times a week    Attends Religious Services: Never     Database administrator or Organizations: No    Attends Engineer, structural: Never    Marital Status: Divorced    Tobacco Counseling Counseling given: Not Answered   Clinical Intake:  Pre-visit preparation completed: Yes  Pain : No/denies pain     Nutritional Risks: None Diabetes: No  How often do you need to have someone help you when you read instructions, pamphlets, or other written materials from your doctor or pharmacy?: 1 - Never  Interpreter Needed?: No  Information entered by :: Renie Ora, LPN   Activities of Daily Living    06/02/2023    3:40 PM 11/12/2022    9:22 AM  In your present state of health, do you have any difficulty performing the following activities:  Hearing? 0   Vision? 0   Difficulty concentrating or making decisions? 0   Walking or climbing stairs? 0   Dressing or bathing? 0   Doing errands, shopping? 0 0  Preparing Food and eating ? N   Using the Toilet? N   In the past six months, have you accidently leaked urine? N   Do you have problems with loss of bowel control? N   Managing your Medications? N   Managing your Finances? N   Housekeeping or managing your Housekeeping? N     Patient Care Team: Gilmore Laroche, FNP as PCP - General (Family Medicine) Thomasene Ripple, DO as PCP - Cardiology (Cardiology) Malachy Mood, MD as Consulting Physician (Hematology) Lonie Peak, MD as Attending Physician (Radiation Oncology) Pollyann Samples, NP as Nurse Practitioner (Nurse Practitioner) Lucretia Roers, MD as Consulting Physician (General Surgery) Jena Gauss Gerrit Friends, MD as Consulting Physician (Gastroenterology)  Indicate any recent Medical Services you may have received from other than [facility] providers in the past year (date may be approximate).     Assessment:   This is a routine wellness examination for Mary Wise.  Hearing/Vision screen Vision Screening - Comments:: Wears rx glasses - up to date with routine eye exams with   Dr.Davis   Goals Addressed             This Visit's Progress    DIET - EAT MORE FRUITS AND VEGETABLES         Depression Screen    06/02/2023    3:37 PM 04/10/2023    8:47 AM 05/29/2020    3:18 PM 01/27/2020    2:56 PM 09/19/2019    3:21 PM 05/18/2019    2:15 PM 10/11/2018    2:59 PM  PHQ 2/9 Scores  PHQ - 2 Score 0 0 0 0 0 0 0  PHQ- 9 Score 0 0  1       Fall Risk    06/02/2023    3:35 PM 04/10/2023    8:47 AM 05/29/2020    3:18 PM 03/06/2020    9:11 AM 02/16/2020    2:04 PM  Fall Risk   Falls in the past year? 0 0 0 0 0  Number falls in past yr: 0 0 0    Injury with Fall? 0 0 0    Risk  for fall due to : No Fall Risks No Fall Risks     Follow up Falls prevention discussed Falls evaluation completed Falls evaluation completed Falls evaluation completed Falls evaluation completed    MEDICARE RISK AT HOME: Medicare Risk at Home Any stairs in or around the home?: Yes If so, are there any without handrails?: No Home free of loose throw rugs in walkways, pet beds, electrical cords, etc?: Yes Adequate lighting in your home to reduce risk of falls?: Yes Life alert?: No Use of a cane, walker or w/c?: No Grab bars in the bathroom?: Yes Shower chair or bench in shower?: Yes Elevated toilet seat or a handicapped toilet?: Yes  TIMED UP AND GO:  Was the test performed?  No    Cognitive Function:        06/02/2023    3:40 PM  6CIT Screen  What Year? 0 points  What month? 0 points  What time? 0 points  Count back from 20 0 points  Months in reverse 0 points  Repeat phrase 0 points  Total Score 0 points    Immunizations Immunization History  Administered Date(s) Administered   Moderna Sars-Covid-2 Vaccination 12/07/2019, 01/04/2020   Pneumococcal Conjugate-13 05/29/2020   Td (Adult),5 Lf Tetanus Toxid, Preservative Free 03/21/1998   Tdap 03/21/1998, 11/02/2013    TDAP status: Up to date  Flu Vaccine status: Due, Education has been provided regarding the  importance of this vaccine. Advised may receive this vaccine at local pharmacy or Health Dept. Aware to provide a copy of the vaccination record if obtained from local pharmacy or Health Dept. Verbalized acceptance and understanding.  Pneumococcal vaccine status: Up to date  Covid-19 vaccine status: Completed vaccines  Qualifies for Shingles Vaccine? Yes   Zostavax completed No   Shingrix Completed?: No.    Education has been provided regarding the importance of this vaccine. Patient has been advised to call insurance company to determine out of pocket expense if they have not yet received this vaccine. Advised may also receive vaccine at local pharmacy or Health Dept. Verbalized acceptance and understanding.  Screening Tests Health Maintenance  Topic Date Due   Zoster Vaccines- Shingrix (1 of 2) Never done   COVID-19 Vaccine (3 - Moderna risk series) 02/01/2020   Pneumonia Vaccine 78+ Years old (2 of 2 - PPSV23 or PCV20) 05/29/2021   Fecal DNA (Cologuard)  10/26/2021   DTaP/Tdap/Td (4 - Td or Tdap) 11/03/2023   Medicare Annual Wellness (AWV)  06/01/2024   MAMMOGRAM  12/21/2024   DEXA SCAN  Completed   Hepatitis C Screening  Completed   HPV VACCINES  Aged Out   INFLUENZA VACCINE  Discontinued    Health Maintenance  Health Maintenance Due  Topic Date Due   Zoster Vaccines- Shingrix (1 of 2) Never done   COVID-19 Vaccine (3 - Moderna risk series) 02/01/2020   Pneumonia Vaccine 35+ Years old (2 of 2 - PPSV23 or PCV20) 05/29/2021   Fecal DNA (Cologuard)  10/26/2021    Colorectal cancer screening: Referral to GI placed will discuss with Pcp . Pt aware the office will call re: appt.  Mammogram status: Completed 12/22/2022. Repeat every year  Bone Density status: Completed 04/15/2021. Results reflect: Bone density results: OSTEOPOROSIS. Repeat every 2 years.  Lung Cancer Screening: (Low Dose CT Chest recommended if Age 65-80 years, 20 pack-year currently smoking OR have quit w/in  15years.) does not qualify.   Lung Cancer Screening Referral: n/a  Additional Screening:  Hepatitis C Screening:  does not qualify; Completed 04/10/2023  Vision Screening: Recommended annual ophthalmology exams for early detection of glaucoma and other disorders of the eye. Is the patient up to date with their annual eye exam?  Yes  Who is the provider or what is the name of the office in which the patient attends annual eye exams? Dr.Davis  If pt is not established with a provider, would they like to be referred to a provider to establish care? No .   Dental Screening: Recommended annual dental exams for proper oral hygiene   Community Resource Referral / Chronic Care Management: CRR required this visit?  No   CCM required this visit?  No     Plan:     I have personally reviewed and noted the following in the patient's chart:   Medical and social history Use of alcohol, tobacco or illicit drugs  Current medications and supplements including opioid prescriptions. Patient is not currently taking opioid prescriptions. Functional ability and status Nutritional status Physical activity Advanced directives List of other physicians Hospitalizations, surgeries, and ER visits in previous 12 months Vitals Screenings to include cognitive, depression, and falls Referrals and appointments  In addition, I have reviewed and discussed with patient certain preventive protocols, quality metrics, and best practice recommendations. A written personalized care plan for preventive services as well as general preventive health recommendations were provided to patient.     Lorrene Reid, LPN   16/05/9603   After Visit Summary: (MyChart) Due to this being a telephonic visit, the after visit summary with patients personalized plan was offered to patient via MyChart   Nurse Notes: none

## 2023-06-02 NOTE — Patient Instructions (Signed)
Ms. Czarnik , Thank you for taking time to come for your Medicare Wellness Visit. I appreciate your ongoing commitment to your health goals. Please review the following plan we discussed and let me know if I can assist you in the future.   Referrals/Orders/Follow-Ups/Clinician Recommendations: Aim for 30 minutes of exercise or brisk walking, 6-8 glasses of water, and 5 servings of fruits and vegetables each day.   This is a list of the screening recommended for you and due dates:  Health Maintenance  Topic Date Due   Zoster (Shingles) Vaccine (1 of 2) Never done   COVID-19 Vaccine (3 - Moderna risk series) 02/01/2020   Pneumonia Vaccine (2 of 2 - PPSV23 or PCV20) 05/29/2021   Cologuard (Stool DNA test)  10/26/2021   DTaP/Tdap/Td vaccine (4 - Td or Tdap) 11/03/2023   Medicare Annual Wellness Visit  06/01/2024   Mammogram  12/21/2024   DEXA scan (bone density measurement)  Completed   Hepatitis C Screening  Completed   HPV Vaccine  Aged Out   Flu Shot  Discontinued    Advanced directives: (Copy Requested) Please bring a copy of your health care power of attorney and living will to the office to be added to your chart at your convenience.  Next Medicare Annual Wellness Visit scheduled for next year: Yes  insert Preventive Care attachment Insert FALL PREVENTION attachment if needed

## 2023-06-03 ENCOUNTER — Telehealth: Payer: Self-pay | Admitting: Family Medicine

## 2023-06-03 ENCOUNTER — Other Ambulatory Visit: Payer: Self-pay | Admitting: Family Medicine

## 2023-06-03 DIAGNOSIS — M5030 Other cervical disc degeneration, unspecified cervical region: Secondary | ICD-10-CM

## 2023-06-03 MED ORDER — OXYCODONE HCL 10 MG PO TABS
10.0000 mg | ORAL_TABLET | Freq: Three times a day (TID) | ORAL | 0 refills | Status: AC | PRN
Start: 2023-06-03 — End: ?

## 2023-06-03 NOTE — Telephone Encounter (Signed)
I sent 10 tabs

## 2023-06-03 NOTE — Telephone Encounter (Signed)
Prescription Request  06/03/2023  LOV: 04/10/2023  What is the name of the medication or equipment? Oxycodone HCl 10 MG TABS [[id number]]  takes bid  Have you contacted your pharmacy to request a refill? Yes   Which pharmacy would you like this sent to?  [facility] [address], [place] - [address] [place] [address] Phone: [phone] Fax: [phone]    Patient notified that their request is being sent to the clinical staff for review and that they should receive a response within 2 business days.   Please advise at Mobile [phone] (mobile)

## 2023-06-03 NOTE — Telephone Encounter (Signed)
I checked on the referral and she was approved yesterday for scheduling so someone will be reaching out to schedule her appt here soon.

## 2023-06-09 ENCOUNTER — Telehealth: Payer: Self-pay | Admitting: Family Medicine

## 2023-06-09 NOTE — Telephone Encounter (Signed)
Placard  Noted Copied Sleeved (put in provider box)  Call patient when ready

## 2023-06-11 ENCOUNTER — Other Ambulatory Visit: Payer: Self-pay | Admitting: Family Medicine

## 2023-06-11 DIAGNOSIS — M5030 Other cervical disc degeneration, unspecified cervical region: Secondary | ICD-10-CM

## 2023-06-11 NOTE — Telephone Encounter (Signed)
Copied from CRM [id number]. Topic: Clinical - Medication Refill >> Jun 11, 2023  1:20 PM Raven B wrote: Most Recent Primary Care Visit:  Provider: Lorrene Reid  Department: [facility]  Visit Type: MEDICARE AWV, SEQUENTIAL  Date: 06/02/2023  Medication:   Has the patient contacted their pharmacy?  (Agent: If no, request that the patient contact the pharmacy for the refill. If patient does not wish to contact the pharmacy document the reason why and proceed with request.) (Agent: If yes, when and what did the pharmacy advise?)  Is this the correct pharmacy for this prescription?  If no, delete pharmacy and type the correct one.  This is the patient's preferred pharmacy:  [facility] [address] [place] - [address] [place] [address] [place] [address] Phone: [phone] Fax: [phone]   Has the prescription been filled recently?   Is the patient out of the medication?   Has the patient been seen for an appointment in the last year OR does the patient have an upcoming appointment?   Can we respond through MyChart?   Agent: Please be advised that Rx refills may take up to 3 business days. We ask that you follow-up with your pharmacy.

## 2023-06-12 ENCOUNTER — Telehealth: Payer: Self-pay | Admitting: Family Medicine

## 2023-06-12 ENCOUNTER — Other Ambulatory Visit: Payer: Self-pay | Admitting: Family Medicine

## 2023-06-12 NOTE — Telephone Encounter (Signed)
Patient is calling in regard to Oxycodone HCl 10 MG TABS [[id number]]    Is needing quantity of  60 pt takes medication 2x daily one in the morning and once in the evening   Pt is not taking medication 3x daily as current order is written  I requesting refill   Pt has other medications to pick up today and would like to have this sent in for pick up as well

## 2023-06-12 NOTE — Telephone Encounter (Signed)
Kindly encourage the patient to follow up with pain management

## 2023-06-15 ENCOUNTER — Other Ambulatory Visit: Payer: Self-pay

## 2023-06-15 ENCOUNTER — Telehealth: Payer: Self-pay | Admitting: Family Medicine

## 2023-06-15 DIAGNOSIS — M5030 Other cervical disc degeneration, unspecified cervical region: Secondary | ICD-10-CM

## 2023-06-15 NOTE — Telephone Encounter (Signed)
Patient came by office in regard to pain management referral   Wants to be referred to    [facility]  Dr. Carmel [place]  [address], [place], [place] Phone: [phone]

## 2023-06-16 ENCOUNTER — Other Ambulatory Visit: Payer: Self-pay | Admitting: Cardiology

## 2023-06-17 MED ORDER — DILTIAZEM HCL ER COATED BEADS 180 MG PO CP24: 180.0000 mg | ORAL_CAPSULE | Freq: Every day | ORAL | 2 refills | Status: AC

## 2023-08-10 ENCOUNTER — Ambulatory Visit: Payer: Medicare Other | Admitting: Family Medicine

## 2023-08-27 ENCOUNTER — Inpatient Hospital Stay: Payer: Medicare Other

## 2023-08-27 ENCOUNTER — Inpatient Hospital Stay: Payer: Medicare Other | Admitting: Nurse Practitioner

## 2023-09-21 NOTE — Assessment & Plan Note (Signed)
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  Proliferation marker Ki67 at 90% -diagnosed in 06/2017. S/p left lumpectomy, Adjuvant chemo AC-T and adjuvant radiation.  -most recent mammogram 12/20/21 was benign. -From a breast cancer standpoint, she is clinically doing well. Lab reviewed, her CBC and CMP are within normal limits. Physical exam was unremarkable. There is no clinical concern for breast cancer recurrence.

## 2023-09-22 ENCOUNTER — Other Ambulatory Visit: Payer: Self-pay

## 2023-09-22 ENCOUNTER — Inpatient Hospital Stay: Payer: Medicare Other | Attending: Hematology

## 2023-09-22 ENCOUNTER — Inpatient Hospital Stay: Payer: Medicare Other

## 2023-09-22 ENCOUNTER — Encounter: Payer: Self-pay | Admitting: Hematology

## 2023-09-22 ENCOUNTER — Inpatient Hospital Stay (HOSPITAL_BASED_OUTPATIENT_CLINIC_OR_DEPARTMENT_OTHER): Payer: Medicare Other | Admitting: Hematology

## 2023-09-22 VITALS — BP 150/75 | HR 72 | Temp 97.7°F | Resp 17 | Wt 164.2 lb

## 2023-09-22 DIAGNOSIS — C50412 Malignant neoplasm of upper-outer quadrant of left female breast: Secondary | ICD-10-CM

## 2023-09-22 DIAGNOSIS — Z79899 Other long term (current) drug therapy: Secondary | ICD-10-CM | POA: Diagnosis not present

## 2023-09-22 DIAGNOSIS — Z923 Personal history of irradiation: Secondary | ICD-10-CM | POA: Insufficient documentation

## 2023-09-22 DIAGNOSIS — M810 Age-related osteoporosis without current pathological fracture: Secondary | ICD-10-CM | POA: Diagnosis not present

## 2023-09-22 DIAGNOSIS — G8929 Other chronic pain: Secondary | ICD-10-CM | POA: Diagnosis not present

## 2023-09-22 DIAGNOSIS — K635 Polyp of colon: Secondary | ICD-10-CM

## 2023-09-22 DIAGNOSIS — Z171 Estrogen receptor negative status [ER-]: Secondary | ICD-10-CM

## 2023-09-22 DIAGNOSIS — Z853 Personal history of malignant neoplasm of breast: Secondary | ICD-10-CM | POA: Insufficient documentation

## 2023-09-22 DIAGNOSIS — M899 Disorder of bone, unspecified: Secondary | ICD-10-CM | POA: Insufficient documentation

## 2023-09-22 DIAGNOSIS — E2839 Other primary ovarian failure: Secondary | ICD-10-CM

## 2023-09-22 DIAGNOSIS — Z9221 Personal history of antineoplastic chemotherapy: Secondary | ICD-10-CM | POA: Diagnosis not present

## 2023-09-22 LAB — CMP (CANCER CENTER ONLY)
ALT: 10 U/L (ref 0–44)
AST: 14 U/L — ABNORMAL LOW (ref 15–41)
Albumin: 4.1 g/dL (ref 3.5–5.0)
Alkaline Phosphatase: 83 U/L (ref 38–126)
Anion gap: 6 (ref 5–15)
BUN: 10 mg/dL (ref 8–23)
CO2: 32 mmol/L (ref 22–32)
Calcium: 9.2 mg/dL (ref 8.9–10.3)
Chloride: 102 mmol/L (ref 98–111)
Creatinine: 0.75 mg/dL (ref 0.44–1.00)
GFR, Estimated: >60 mL/min (ref >60)
Glucose, Bld: 84 mg/dL (ref 70–99)
Potassium: 3.6 mmol/L (ref 3.5–5.1)
Sodium: 140 mmol/L (ref 135–145)
Total Bilirubin: 0.5 mg/dL (ref 0.0–1.2)
Total Protein: 7 g/dL (ref 6.5–8.1)

## 2023-09-22 LAB — CBC WITH DIFFERENTIAL (CANCER CENTER ONLY)
Abs Immature Granulocytes: 0.02 10*3/uL (ref 0.00–0.07)
Basophils Absolute: 0.1 10*3/uL (ref 0.0–0.1)
Basophils Relative: 1 %
Eosinophils Absolute: 0.2 10*3/uL (ref 0.0–0.5)
Eosinophils Relative: 2 %
HCT: 39.8 % (ref 36.0–46.0)
Hemoglobin: 13.2 g/dL (ref 12.0–15.0)
Immature Granulocytes: 0 %
Lymphocytes Relative: 38 %
Lymphs Abs: 2.4 10*3/uL (ref 0.7–4.0)
MCH: 29.4 pg (ref 26.0–34.0)
MCHC: 33.2 g/dL (ref 30.0–36.0)
MCV: 88.6 fL (ref 80.0–100.0)
Monocytes Absolute: 0.4 10*3/uL (ref 0.1–1.0)
Monocytes Relative: 6 %
Neutro Abs: 3.3 10*3/uL (ref 1.7–7.7)
Neutrophils Relative %: 53 %
Platelet Count: 312 10*3/uL (ref 150–400)
RBC: 4.49 MIL/uL (ref 3.87–5.11)
RDW: 12.9 % (ref 11.5–15.5)
WBC Count: 6.3 10*3/uL (ref 4.0–10.5)
nRBC: 0 % (ref 0.0–0.2)

## 2023-09-22 NOTE — Progress Notes (Signed)
 [facility]   Telephone:(336) [phone] Fax:(336) [phone]   Clinic Follow up Note   Patient Care Team: Thomasene Ripple, DO as PCP - Cardiology (Cardiology) Malachy Mood, MD as Consulting Physician (Hematology) Lonie Peak, MD as Attending Physician (Radiation Oncology) Pollyann Samples, NP as Nurse Practitioner (Nurse Practitioner) Lucretia Roers, MD as Consulting Physician (General Surgery) Corbin Ade, MD as Consulting Physician (Gastroenterology)  Date of Service:  09/22/2023  CHIEF COMPLAINT: f/u of breast cancer  CURRENT THERAPY:  Cancer surveillance  Oncology History   Malignant neoplasm of upper-outer quadrant of left breast in female, 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  Proliferation marker Ki67 at 90% -diagnosed in 06/2017. S/p left lumpectomy, Adjuvant chemo AC-T and adjuvant radiation.  -most recent mammogram 12/2022 was benign.     Assessment and Plan    Breast Cancer Follow-up 73 year old female, diagnosed November 2018, now over five years post-diagnosis. Reports significant pain likely due to fibromyalgia and arthritis. Low likelihood of recurrence given time elapsed. Discussed Guardian blood test for cancer cell detection, suitable due to her severe claustrophobia and anxiety preventing imaging. - Order Guardian blood test today - Schedule follow-up visit in one year  Chronic Pain Chronic pain from fibromyalgia, arthritis, and bone spurs, severely affecting mobility. Managed on oxycodone 10 mg, which aids mobility. Discussed pain management and potential referral to a specialist if pain worsens. - Continue oxycodone 10 mg - Consider referral to pain management specialist if pain worsens  Bone Disease Osteoporosis and bone spurs, with fragile bones precluding surgery. Not on bone-strengthening medications. Discussed need for bone  density scan and potential medications at next visit. - Order bone density scan to coincide with mammogram - Discuss bone-strengthening medications at next visit  General Health Maintenance Due for colonoscopy with history of polyps. Requires vitamin D3 and cancer bras. Emphasized importance of routine screenings and preventive measures. - Refer to Dr. Maurine Minister for colonoscopy - Recommend OTC vitamin D3 2000 IU - Provide prescription for cancer bras.     Plan -Due to her severe diffuse bone pain, I recommend bone and CT scan to rule out cancer recurrence, patient declined.  Will obtain circulating tumor DNA GuardianReveal today.  If that is negative, no skin needed. -Lab and follow-up in 1 year for last visit.    SUMMARY OF ONCOLOGIC HISTORY: Oncology History Overview Note  Cancer Staging Malignant neoplasm of upper-outer quadrant of left breast in female, estrogen receptor negative (HCC) Staging form: Breast, AJCC 8th Edition - Pathologic stage from 08/19/2017: Stage IIA (pT2, pN0(sn), cM0, G3, ER: Negative, PR: Negative, HER2: Negative) - Signed by Daisy Blossom, MD on 09/01/2017     Malignant neoplasm of upper-outer quadrant of left breast in female, estrogen receptor negative (HCC)  06/16/2017 Initial Diagnosis   Malignant neoplasm of upper-outer quadrant of left breast in female: Initially presented with left deltoid mass which was consistent with lipoma.  During examination, a mass in the left breast was appreciated by palpation.   06/16/2017 Mammogram   BL Diagnostic Study: Irregular hyperdense mass in the upper outer quadrant of the left breast measuring 3.6 cm without radiographically detectable lymphadenopathy.  Normal appearance of the right breast.   06/16/2017 Breast US   3.2 cm irregular mass located at 2:30 o'clock, no pathological lymphadenopathy.   06/23/2017 Pathology Results   Ultrasound-guided biopsy: Positive for invasive ductal carcinoma, grade 3, ER 0%, PR  0%, HER-2 negative [facility]   07/31/2017 Surgery   Partial left mastectomy with sentinel lymph node biopsy --Pathology: Invasive ductal carcinoma, measuring 4.5 cm, grade 3.  All margins negative, but the anterior margin less than 0.1 cm.  No surrounding DCIS.  0/5 lymph nodes positive for malignancy   10/08/2017 PET scan   IMPRESSION: 1. No evidence breast cancer metastasis on FDG PET-CT scan. 2. Postsurgical change in the LEFT lateral breast and LEFT axilla.   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 Significance The following genes were evaluated for sequence changes and exonic deletions/duplications: ALK, APC, ATM, AXIN2, BAP1, BARD1, BLM, BMPR1A, BRCA1, BRCA2, BRIP1, CASR, CDC73, CDH1, CDK4, CDKN1B, CDKN1C, CDKN2A (p14ARF), CDKN2A (p16INK4a), CEBPA, CHEK2, CTNNA1, DICER1, DIS3L2, EPCAM*, FH, FLCN, GATA2, GPC3, GREM1*, HRAS, KIT, MAX, MEN1, MET, MLH1, MSH2, MSH3, MSH6, MUTYH, NBN, NF1, NF2, PALB2, PDGFRA, PHOX2B*, PMS2, POLD1, POLE, POT1, PRKAR1A, PTCH1, PTEN, RAD50, RAD51C, RAD51D, RB1, RECQL4, RET, RUNX1, SDHAF2, SDHB, SDHC, SDHD, SMAD4, SMARCA4, SMARCB1, SMARCE1, STK11, SUFU, TERC, TERT, TMEM127, TP53, TSC1, TSC2, VHL, WRN*, WT1 The following genes were evaluated for sequence changes only: EGFR*, HOXB13*, MITF*, NTHL1*, SDHA Results are negative unless otherwise indicated   05/27/2018 - 06/23/2018 Radiation Therapy   Radiation treatment dates:   05/27/2018 - 06/23/2018   Site/dose:    1. Left Breast / 40.05 Gy in 15 fractions 2. Left Breast Boost / 10 Gy in 5 fractions - Total dose of 50.05 Gy   Beams/energy:    1. 3D / 6X Photon 2. Isodose / 10X, 6X Photon   Narrative: The patient tolerated radiation  treatment relatively well.  She experienced fatigue and some expected skin irritation with mild erythema and soreness to her breast/axilla. She is applying Radiaplex twice daily as directed. She also mentioned bilateral hand cramping with bruising. She was advised to discuss this with her PCP as it is not related to her radiation treatment.    Survivorship   Per Santiago Glad, NP       Discussed the use of AI scribe software for clinical note transcription with the patient, who gave verbal consent to proceed.  History of Present Illness   A 73 year old female with a history of breast cancer, fibromyalgia, and bone disease presents for a follow-up appointment. She reports widespread pain, which she attributes to her bone disease and fibromyalgia. The pain is constant and severe, affecting her mobility and quality of life. Despite the pain, she manages to remain mobile with the help of oxycodone. She has also experienced weight loss, which she attributes to dietary modifications made due to her bone disease. She expresses satisfaction with her weight loss. The patient requests a prescription for Vitamin D, stating a preference for prescription medications over over-the-counter options. She also requests a referral for a colonoscopy, as she is due for one. She mentions needing a prescription for cancer bras, as her current ones are wearing out quickly.         All other systems were reviewed with the patient and are negative.  MEDICAL HISTORY:  Past Medical History:  Diagnosis Date   Anxiety    Asthma    Cancer [facility])    left breast cancer   Family history of cancer    Genetic testing 02/01/2018   Multi-Cancer  panel (83 genes) @ [facility] - No pathogenic mutations detected   GERD (gastroesophageal reflux disease)    Hemorrhoids    History of radiation therapy 05/27/18- 06/23/18   Left Breast 40.05 Gy in 15 fractions, left breast boost 10 Gy in 5 fractions.    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    SURGICAL HISTORY: Past Surgical History:  Procedure Laterality Date   ABDOMINAL HYSTERECTOMY     benign spine tumor  2022   BREAST LUMPECTOMY Left 2018   CATARACT EXTRACTION Bilateral    CESAREAN SECTION     CHOLECYSTECTOMY     COLONOSCOPY WITH PROPOFOL N/A 01/15/2021   Procedure: COLONOSCOPY WITH PROPOFOL;  Surgeon: Dolores Frame, MD;  [facility];  Service: Gastroenterology;  Laterality: N/A;  10:00   ERCP     ESOPHAGEAL DILATION N/A 04/10/2021   Procedure: ESOPHAGEAL DILATION;  Surgeon: Malissa Hippo, MD;  Location: [facility];  Service: Endoscopy;  Laterality: N/A;   ESOPHAGOGASTRODUODENOSCOPY (EGD) WITH PROPOFOL N/A 04/10/2021   Procedure: ESOPHAGOGASTRODUODENOSCOPY (EGD) WITH PROPOFOL;  Surgeon: Malissa Hippo, MD;  [facility]: [facility];  Service: Endoscopy;  Laterality: N/A;  10:55   EXCISION OF SKIN TAG N/A 11/14/2022   Procedure: EXCISION OF SKIN TAG;  Surgeon: Lucretia Roers, MD;  Location: [facility];  Service: General;  Laterality: N/A;   IR FLUORO GUIDE PORT INSERTION RIGHT  10/05/2017   IR REMOVAL TUN ACCESS W/ PORT W/O FL MOD SED  05/13/2018   IR US GUIDE VASC ACCESS RIGHT  10/05/2017   LIPOMA EXCISION Left 03/16/2018   left shoulder   LIVER SURGERY     MASS EXCISION N/A 03/30/2020   Procedure: EXCISION OF SEBACEOUS CYST ON LEFT NECK, SEBACEOUSE CYST ON BACK AND ATYPICAL MOLE ON RIGHT NECK, AND LEFT AXILLA SKIN TAG;  Surgeon: Lucretia Roers, MD;  Location: [facility];  Service: General;  Laterality: N/A;   MASS EXCISION Right 11/14/2022   Procedure: EXCISION MASS, NEOPLASM FOREARM;  Surgeon: Lucretia Roers, MD;  Location: [facility];  Service: General;  Laterality: Right;   MOLE REMOVAL Left 11/14/2022   Procedure: MOLE REMOVAL Left CHEST;  Surgeon: Lucretia Roers, MD;  Location: [facility];  Service: General;  Laterality: Left;   PARTIAL  MASTECTOMY WITH AXILLARY SENTINEL LYMPH NODE BIOPSY Left 07/31/2017   Procedure: LEFT PARTIAL MASTECTOMY WITH AXILLARY SENTINEL LYMPH NODE BIOPSY;  Surgeon: Lucretia Roers, MD;  Location: [facility];  Service: General;  Laterality: Left;   POLYPECTOMY  01/15/2021   Procedure: POLYPECTOMY;  Surgeon: Dolores Frame, MD;  [facility];  Service: Gastroenterology;;   TOTAL KNEE ARTHROPLASTY Left 07/05/2019   Procedure: TOTAL KNEE ARTHROPLASTY;  Surgeon: Sheral Apley, MD;  Location: [facility];  Service: Orthopedics;  Laterality: Left;    I have reviewed the social history and family history with the patient and they are unchanged from previous note.  ALLERGIES:  is allergic to onion, penicillins, sulfa antibiotics, yellow dye, tape, clindamycin/lincomycin, latex, lipitor [atorvastatin calcium], and tetracycline.  MEDICATIONS:  Current Outpatient Medications  Medication Sig Dispense Refill   albuterol (PROVENTIL) (2.5 MG/3ML) 0.083% nebulizer solution Take 3 mLs (2.5 mg total) by nebulization every 6 (six) hours as needed for wheezing or shortness of breath. 90 mL 3   albuterol (VENTOLIN HFA) 108 (90 Base) MCG/ACT inhaler  INHALE 2 PUFFS BY MOUTH EVERY 6 HOURS AS NEEDED FOR WHEEZING FOR SHORTNESS OF BREATH 18 g 0   ALPRAZolam (XANAX) 1 MG tablet Take 1 tablet (1 mg total) by mouth 2 (two) times daily as needed. for anxiety (Patient taking differently: Take 1 mg by mouth daily.) 60 tablet 0   Cholecalciferol (VITAMIN D3) 50 MCG (2000 UT) capsule Take 1 capsule (2,000 Units total) by mouth daily. 60 capsule 1   diltiazem (CARDIZEM CD) 180 MG 24 hr capsule Take 1 capsule (180 mg total) by mouth daily. 90 capsule 2   fluticasone (FLOVENT HFA) 110 MCG/ACT inhaler Inhale 1 puff into the lungs 2 (two) times daily.     Oxycodone HCl 10 MG TABS Take 1 tablet (10 mg total) by mouth 3 (three) times daily as needed (bone pain). 10 tablet 0   pantoprazole (PROTONIX) 40 MG tablet Take 40 mg  by mouth daily.     pravastatin (PRAVACHOL) 20 MG tablet Take 1 tablet (20 mg total) by mouth daily. 90 tablet 0   SUMAtriptan (IMITREX) 100 MG tablet May repeat in 2 hours if headache persists or recurs. (Patient taking differently: Take 100 mg by mouth every 2 (two) hours as needed for migraine (May repeat in 2 hours if headache persists or recurs.).) 9 tablet 0   tiZANidine (ZANAFLEX) 4 MG tablet Take 1 tablet (4 mg total) by mouth every 8 (eight) hours as needed for muscle spasms. (Patient taking differently: Take 4 mg by mouth in the morning and at bedtime.) 60 tablet 0   valsartan (DIOVAN) 160 MG tablet Take 1 tablet (160 mg total) by mouth daily. 90 tablet 3   zolpidem (AMBIEN) 10 MG tablet Take 1 tablet (10 mg total) by mouth at bedtime as needed. for sleep (Patient taking differently: Take 10 mg by mouth at bedtime. for sleep) 30 tablet 0   No current facility-administered medications for this visit.    PHYSICAL EXAMINATION: ECOG PERFORMANCE STATUS: 2 - Symptomatic, <50% confined to bed  Vitals:   09/22/23 1345  BP: (!) 150/75  Pulse: 72  Resp: 17  Temp: 97.7 F (36.5 C)  SpO2: 93%   Wt Readings from Last 3 Encounters:  09/22/23 164 lb 3.2 oz (74.5 kg)  06/02/23 180 lb (81.6 kg)  04/10/23 198 lb 1.9 oz (89.9 kg)     GENERAL:alert, no distress and comfortable SKIN: skin color, texture, turgor are normal, no rashes or significant lesions EYES: normal, Conjunctiva are pink and non-injected, sclera clear NECK: supple, thyroid normal size, non-tender, without nodularity LYMPH:  no palpable lymphadenopathy in the cervical, axillary  LUNGS: clear to auscultation and percussion with normal breathing effort HEART: regular rate & rhythm and no murmurs and no lower extremity edema ABDOMEN:abdomen soft, non-tender and normal bowel sounds Musculoskeletal:no cyanosis of digits and no clubbing, diffuse bone tenderness NEURO: alert & oriented x 3 with fluent speech, no focal  motor/sensory deficits  LABORATORY DATA:  I have reviewed the data as listed    Latest Ref Rng & Units 09/22/2023    1:31 PM 04/10/2023   10:08 AM 08/25/2022   11:07 AM  CBC  WBC 4.0 - 10.5 K/uL 6.3  7.2  5.6   Hemoglobin 12.0 - 15.0 g/dL 16.1  09.6  04.5   Hematocrit 36.0 - 46.0 % 39.8  39.6  40.3   Platelets 150 - 400 K/uL 312  300  298         Latest Ref Rng & Units 09/22/2023  1:31 PM 04/10/2023   10:08 AM 08/25/2022   11:07 AM  CMP  Glucose 70 - 99 mg/dL 84  90  83   BUN 8 - 23 mg/dL 10  13  12    Creatinine 0.44 - 1.00 mg/dL 0.98  1.19  1.47   Sodium 135 - 145 mmol/L 140  141  139   Potassium 3.5 - 5.1 mmol/L 3.6  3.8  3.8   Chloride 98 - 111 mmol/L 102  101  104   CO2 22 - 32 mmol/L 32  25  30   Calcium 8.9 - 10.3 mg/dL 9.2  9.6  9.3   Total Protein 6.5 - 8.1 g/dL 7.0  7.0  7.1   Total Bilirubin 0.0 - 1.2 mg/dL 0.5  0.2  0.4   Alkaline Phos 38 - 126 U/L 83  109  92   AST 15 - 41 U/L 14  19  21    ALT 0 - 44 U/L 10  16  20        RADIOGRAPHIC STUDIES: I have personally reviewed the radiological images as listed and agreed with the findings in the report. No results found.    Orders Placed This Encounter  Procedures   DG Bone Density    Standing Status:   Future    Expected Date:   12/23/2023    Expiration Date:   09/21/2024    Reason for Exam (SYMPTOM  OR DIAGNOSIS REQUIRED):   screening    [facility]    Release to patient:   Immediate   Guardant Reveal    Standing Status:   Future    Number of Occurrences:   1    Expected Date:   09/22/2023    Expiration Date:   09/21/2024   Ambulatory referral to Gastroenterology    Referral Priority:   Routine    Referral Type:   Consultation    Referral Reason:   Specialty Services Required    Number of Visits Requested:   1   All questions were answered. The patient knows to call the clinic with any problems, questions or concerns. No barriers to learning was detected. The total time  spent in the appointment was 30 minutes.     Malachy Mood, MD 09/22/2023

## 2023-09-25 ENCOUNTER — Ambulatory Visit: Payer: Medicare Other | Admitting: Cardiology

## 2023-09-28 ENCOUNTER — Other Ambulatory Visit: Payer: Self-pay

## 2023-09-30 ENCOUNTER — Ambulatory Visit: Payer: Medicare Other | Attending: Cardiology | Admitting: Cardiology

## 2023-09-30 ENCOUNTER — Encounter: Payer: Self-pay | Admitting: Cardiology

## 2023-09-30 VITALS — BP 150/88 | HR 76 | Ht 68.0 in | Wt 158.8 lb

## 2023-09-30 DIAGNOSIS — Z5919 Other inadequate housing: Secondary | ICD-10-CM | POA: Insufficient documentation

## 2023-09-30 DIAGNOSIS — E782 Mixed hyperlipidemia: Secondary | ICD-10-CM | POA: Insufficient documentation

## 2023-09-30 DIAGNOSIS — I10 Essential (primary) hypertension: Secondary | ICD-10-CM | POA: Insufficient documentation

## 2023-09-30 NOTE — Patient Instructions (Signed)
 Medication Instructions:  Your physician recommends that you continue on your current medications as directed. Please refer to the Current Medication list given to you today.  *If you need a refill on your cardiac medications before your next appointment, please call your pharmacy*   Follow-Up: At [facility], you and your health needs are our priority.  As part of our continuing mission to provide you with exceptional heart care, we have created designated Provider Care Teams.  These Care Teams include your primary Cardiologist (physician) and Advanced Practice Providers (APPs -  Physician Assistants and Nurse Practitioners) who all work together to provide you with the care you need, when you need it.  Your next appointment:   9 month(s)  Provider:   Thomasene Ripple, DO

## 2023-10-03 NOTE — Progress Notes (Signed)
 Cardiology Office Note:    Date:  10/03/2023   ID:  RIDHI HOFFERT, DOB 1950/12/01, MRN [id number]  PCP:  No primary care provider on file.  Cardiologist:  Thomasene Ripple, DO  Electrophysiologist:  None   Referring MD: Gilmore Laroche, FNP   No chief complaint on file.   History of Present Illness:    Mary Wise is a 73 y.o. female with a hx of hypertension, hyperlipidemia, NSVT, PAT history of breast cancer status postlumpectomy chemo and radiation, anxiety here today for follow-up visit.   At her last visit her valsartan was increased.   Since her visit with me she has been battling  with severe pain and weight loss secondary to a new diagnosis of bone disease. She describes the pain as constant and severe, affecting her entire body but particularly her spine, which she feels is 'curving inward.' She also reports a recent fall on an uneven sidewalk, resulting in injuries to her knuckles, knees, and hip.  [place] and struggles with daily tasks such as laundry and taking out the trash due to her pain and the topography of her home. She has applied for a smaller, more accessible apartment but requires a doctor's signature for approval.  She also reports an allergy to yellow dyes, which has caused a rash and swelling of the throat and tongue. She has been taking diltiazem and pravastatin for hypertension and hyperlipidemia, respectively, and has discarded a yellow pill (presumed to be prednisone) due to her allergy.  The patient also reports chronic muscle pain, which she attributes to her chemotherapy treatment. She has been using a cane for mobility and has a walker available if needed.  Her blood pressure at home she tells me is within target.   Past Medical History:  Diagnosis Date   Anxiety    Asthma    Cancer (HCC)    left breast cancer   Family history of cancer    Genetic testing 02/01/2018   Multi-Cancer panel (83 genes) @ [facility] - No pathogenic  mutations detected   GERD (gastroesophageal reflux disease)    Hemorrhoids    History of radiation therapy 05/27/18- 06/23/18   Left Breast 40.05 Gy in 15 fractions, left breast boost 10 Gy in 5 fractions.    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    Past Surgical History:  Procedure Laterality Date   ABDOMINAL HYSTERECTOMY     benign spine tumor  2022   BREAST LUMPECTOMY Left 2018   CATARACT EXTRACTION Bilateral    CESAREAN SECTION     CHOLECYSTECTOMY     COLONOSCOPY WITH PROPOFOL N/A 01/15/2021   Procedure: COLONOSCOPY WITH PROPOFOL;  Surgeon: Dolores Frame, MD;  [facility];  Service: Gastroenterology;  Laterality: N/A;  10:00   ERCP     ESOPHAGEAL DILATION N/A 04/10/2021   Procedure: ESOPHAGEAL DILATION;  Surgeon: Malissa Hippo, MD;  [facility];  Service: Endoscopy;  Laterality: N/A;   ESOPHAGOGASTRODUODENOSCOPY (EGD) WITH PROPOFOL N/A 04/10/2021   Procedure: ESOPHAGOGASTRODUODENOSCOPY (EGD) WITH PROPOFOL;  Surgeon: Malissa Hippo, MD;  [facility];  Service: Endoscopy;  Laterality: N/A;  10:55   EXCISION OF SKIN TAG N/A 11/14/2022   Procedure: EXCISION OF SKIN TAG;  Surgeon: Lucretia Roers, MD;  Location: [facility];  Service: General;  Laterality: N/A;   IR FLUORO GUIDE PORT INSERTION RIGHT  10/05/2017   IR REMOVAL TUN ACCESS W/ PORT W/O FL MOD SED  05/13/2018   IR US GUIDE VASC ACCESS RIGHT  10/05/2017   LIPOMA EXCISION Left 03/16/2018   left shoulder   LIVER SURGERY     MASS EXCISION N/A 03/30/2020   Procedure: EXCISION OF SEBACEOUS CYST ON LEFT NECK, SEBACEOUSE CYST ON BACK AND ATYPICAL MOLE ON RIGHT NECK, AND LEFT AXILLA SKIN TAG;  Surgeon: Lucretia Roers, MD;  Location: [facility];  Service: General;  Laterality: N/A;   MASS EXCISION Right 11/14/2022   Procedure: EXCISION MASS, NEOPLASM FOREARM;  Surgeon: Lucretia Roers, MD;  Location:  [facility];  Service: General;  Laterality: Right;   MOLE REMOVAL Left 11/14/2022   Procedure: MOLE REMOVAL Left CHEST;  Surgeon: Lucretia Roers, MD;  Location: [facility];  Service: General;  Laterality: Left;   PARTIAL MASTECTOMY WITH AXILLARY SENTINEL LYMPH NODE BIOPSY Left 07/31/2017   Procedure: LEFT PARTIAL MASTECTOMY WITH AXILLARY SENTINEL LYMPH NODE BIOPSY;  Surgeon: Lucretia Roers, MD;  Location: [facility];  Service: General;  Laterality: Left;   POLYPECTOMY  01/15/2021   Procedure: POLYPECTOMY;  Surgeon: Dolores Frame, MD;  [facility];  Service: Gastroenterology;;   TOTAL KNEE ARTHROPLASTY Left 07/05/2019   Procedure: TOTAL KNEE ARTHROPLASTY;  Surgeon: Sheral Apley, MD;  Location: [facility];  Service: Orthopedics;  Laterality: Left;    Current Medications: Current Meds  Medication Sig   albuterol (PROVENTIL) (2.5 MG/3ML) 0.083% nebulizer solution Take 3 mLs (2.5 mg total) by nebulization every 6 (six) hours as needed for wheezing or shortness of breath.   albuterol (VENTOLIN HFA) 108 (90 Base) MCG/ACT inhaler INHALE 2 PUFFS BY MOUTH EVERY 6 HOURS AS NEEDED FOR WHEEZING FOR SHORTNESS OF BREATH   ALPRAZolam (XANAX) 1 MG tablet Take 1 tablet (1 mg total) by mouth 2 (two) times daily as needed. for anxiety (Patient taking differently: Take 1 mg by mouth daily.)   Oxycodone HCl 10 MG TABS Take 1 tablet (10 mg total) by mouth 3 (three) times daily as needed (bone pain).   pantoprazole (PROTONIX) 40 MG tablet Take 40 mg by mouth daily.   pravastatin (PRAVACHOL) 20 MG tablet Take 1 tablet (20 mg total) by mouth daily.   SUMAtriptan (IMITREX) 100 MG tablet May repeat in 2 hours if headache persists or recurs. (Patient taking differently: Take 100 mg by mouth every 2 (two) hours as needed for migraine (May repeat in 2 hours if headache persists or recurs.).)   tiZANidine (ZANAFLEX) 4 MG tablet Take 1 tablet (4 mg total) by mouth every 8 (eight) hours as needed for muscle  spasms. (Patient taking differently: Take 4 mg by mouth in the morning and at bedtime.)   valsartan (DIOVAN) 160 MG tablet Take 1 tablet (160 mg total) by mouth daily.   zolpidem (AMBIEN) 10 MG tablet Take 1 tablet (10 mg total) by mouth at bedtime as needed. for sleep (Patient taking differently: Take 10 mg by mouth at bedtime. for sleep)     Allergies:   Onion, Penicillins, Sulfa antibiotics, Yellow dye, Tape, Clindamycin/lincomycin, Latex, Lipitor [atorvastatin calcium], and Tetracycline   Social History   Socioeconomic History   Marital status: Single    Spouse name: Not on file   Number of children: Not on file   Years of education: Not on file   Highest education level: Not on file  Occupational History  Not on file  Tobacco Use   Smoking status: Never    Passive exposure: Current   Smokeless tobacco: Never  Vaping Use   Vaping status: Never Used  Substance and Sexual Activity   Alcohol use: No   Drug use: Never   Sexual activity: Never  Other Topics Concern   Not on file  Social History Narrative   Lives in Assisted Living on [address].    Social Drivers of Corporate investment banker Strain: Low Risk  (09/09/2023)   Received from [facility]   Overall Financial Resource Strain (CARDIA)    Difficulty of Paying Living Expenses: Not hard at all  Food Insecurity: No Food Insecurity (09/09/2023)   [facility]   Hunger Vital Sign    Worried About Running Out of Food in the Last Year: Never true    Ran Out of Food in the Last Year: Never true  Transportation Needs: No Transportation Needs (09/09/2023)   Received from [facility] - Transportation    Lack of Transportation (Medical): No    Lack of Transportation (Non-Medical): No  Physical Activity: Insufficiently Active (06/02/2023)   Exercise Vital Sign    Days of Exercise per Week: 2 days    Minutes of Exercise per Session: 20 min  Stress: No Stress Concern Present (06/02/2023)    [employer] of Occupational Health - Occupational Stress Questionnaire    Feeling of Stress : Not at all  Social Connections: Socially Isolated (06/02/2023)   Social Connection and Isolation Panel [NHANES]    Frequency of Communication with Friends and Family: More than three times a week    Frequency of Social Gatherings with Friends and Family: More than three times a week    Attends Religious Services: Never    Database administrator or Organizations: No    Attends Engineer, structural: Never    Marital Status: Divorced     Family History: The patient's family history includes Breast cancer in her maternal grandmother and mother; Cancer in her paternal aunt, paternal grandfather, paternal grandmother, paternal uncle, paternal uncle, paternal uncle, and another family member; Cancer (age of onset: 62) in her mother; Cancer (age of onset: 35) in her father; Diabetes in her brother, sister, sister, and sister; Lung cancer in her maternal aunt; Melanoma in her mother and another family member; Ovarian cancer (age of onset: 65) in her sister; Pancreatic cancer in her sister; Prostate cancer in her brother; Scoliosis in her daughter; Stomach cancer in her cousin; Stomach cancer (age of onset: 6) in her maternal aunt; Vision loss in her sister.  ROS:   Review of Systems  Constitution: Negative for decreased appetite, fever and weight gain.  HENT: Negative for congestion, ear discharge, hoarse voice and sore throat.   Eyes: Negative for discharge, redness, vision loss in right eye and visual halos.  Cardiovascular: Negative for chest pain, dyspnea on exertion, leg swelling, orthopnea and palpitations.  Respiratory: Negative for cough, hemoptysis, shortness of breath and snoring.   Endocrine: Negative for heat intolerance and polyphagia.  Hematologic/Lymphatic: Negative for bleeding problem. Does not bruise/bleed easily.  Skin: Negative for flushing, nail changes, rash and  suspicious lesions.  Musculoskeletal: Negative for arthritis, joint pain, muscle cramps, myalgias, neck pain and stiffness.  Gastrointestinal: Negative for abdominal pain, bowel incontinence, diarrhea and excessive appetite.  Genitourinary: Negative for decreased libido, genital sores and incomplete emptying.  Neurological: Negative for brief paralysis, focal weakness, headaches and loss  of balance.  Psychiatric/Behavioral: Negative for altered mental status, depression and suicidal ideas.  Allergic/Immunologic: Negative for HIV exposure and persistent infections.    EKGs/Labs/Other Studies Reviewed:    The following studies were reviewed today:   EKG:  The ekg ordered today demonstrates Normal sinus rhythm HR 78 bpm  Recent Labs: 04/10/2023: TSH 3.620 09/22/2023: ALT 10; BUN 10; Creatinine 0.75; Hemoglobin 13.2; Platelet Count 312; Potassium 3.6; Sodium 140  Recent Lipid Panel    Component Value Date/Time   CHOL 207 (H) 04/10/2023 1008   TRIG 86 04/10/2023 1008   HDL 65 04/10/2023 1008   CHOLHDL 3.2 04/10/2023 1008   CHOLHDL 3.9 09/10/2020 1428   LDLCALC 127 (H) 04/10/2023 1008   LDLCALC 147 (H) 09/10/2020 1428    Physical Exam:    VS:  BP (!) 150/88   Pulse 76   Ht 5\' 8"  (1.727 m)   Wt 158 lb 12.8 oz (72 kg)   SpO2 98%   BMI 24.15 kg/m     Wt Readings from Last 3 Encounters:  09/30/23 158 lb 12.8 oz (72 kg)  09/22/23 164 lb 3.2 oz (74.5 kg)  06/02/23 180 lb (81.6 kg)     GEN: Well nourished, well developed in no acute distress HEENT: Normal NECK: No JVD; No carotid bruits LYMPHATICS: No lymphadenopathy CARDIAC: S1S2 noted,RRR, no murmurs, rubs, gallops RESPIRATORY:  Clear to auscultation without rales, wheezing or rhonchi  ABDOMEN: Soft, non-tender, non-distended, +bowel sounds, no guarding. EXTREMITIES: No edema, No cyanosis, no clubbing MUSCULOSKELETAL:  No deformity  SKIN: Warm and dry NEUROLOGIC:  Alert and oriented x 3, non-focal PSYCHIATRIC:  Normal  affect, good insight  ASSESSMENT:    1. Essential hypertension   2. Mixed hyperlipidemia   3. Housing unsatisfactory     PLAN:    Hypertensive heart disease - blood pressure elevated today but she tells me that this is usually controlled at home. I will not adjust antihypertensive medication today because I would like to avoid medication induced hypotension.   Hyperlipidemia - continue current dose of statin  Bone Disease Patient reports significant pain and weight loss due to bone disease. She describes her spine as curving inward and causing pain. She also reports a recent fall resulting in injuries to her knuckles, knees, and hip. Leading to dfifficulties - she has her form with her today - since this is an urgent matter and I have followed the patient I am happy to complete her form with my signature.    Housing Situation Patient reports difficulty with her current living situation due to physical limitations and is seeking to move into a more accessible apartment. She needs a doctor's signature for the application. I will sign the necessary paperwork for the patient's housing application.  Follow-up No specific follow-up plan was discussed in the conversation.   The patient is in agreement with the above plan. The patient left the office in stable condition.  The patient will follow up in   Medication Adjustments/Labs and Tests Ordered: Current medicines are reviewed at length with the patient today.  Concerns regarding medicines are outlined above.  No orders of the defined types were placed in this encounter.  No orders of the defined types were placed in this encounter.   Patient Instructions  Medication Instructions:  Your physician recommends that you continue on your current medications as directed. Please refer to the Current Medication list given to you today.  *If you need a refill on your cardiac medications before  your next appointment, please call your  pharmacy*   Follow-Up: At [facility], you and your health needs are our priority.  As part of our continuing mission to provide you with exceptional heart care, we have created designated Provider Care Teams.  These Care Teams include your primary Cardiologist (physician) and Advanced Practice Providers (APPs -  Physician Assistants and Nurse Practitioners) who all work together to provide you with the care you need, when you need it.   Your next appointment:   9 month(s)  Provider:   Thomasene Ripple, DO          Adopting a Healthy Lifestyle.  Know what a healthy weight is for you (roughly BMI <25) and aim to maintain this   Aim for 7+ servings of fruits and vegetables daily   65-80+ fluid ounces of water or unsweet tea for healthy kidneys   Limit to max 1 drink of alcohol per day; avoid smoking/tobacco   Limit animal fats in diet for cholesterol and heart health - choose grass fed whenever available   Avoid highly processed foods, and foods high in saturated/trans fats   Aim for low stress - take time to unwind and care for your mental health   Aim for 150 min of moderate intensity exercise weekly for heart health, and weights twice weekly for bone health   Aim for 7-9 hours of sleep daily   When it comes to diets, agreement about the perfect plan isnt easy to find, even among the experts. Experts at the [facility] of [employer] developed an idea known as the Healthy Eating Plate. Just imagine a plate divided into logical, healthy portions.   The emphasis is on diet quality:   Load up on vegetables and fruits - one-half of your plate: Aim for color and variety, and remember that potatoes dont count.   Go for whole grains - one-quarter of your plate: Whole wheat, barley, wheat berries, quinoa, oats, brown rice, and foods made with them. If you want pasta, go with whole wheat pasta.   Protein power - one-quarter of your plate: Fish, chicken, beans, and nuts  are all healthy, versatile protein sources. Limit red meat.   The diet, however, does go beyond the plate, offering a few other suggestions.   Use healthy plant oils, such as olive, canola, soy, corn, sunflower and peanut. Check the labels, and avoid partially hydrogenated oil, which have unhealthy trans fats.   If youre thirsty, drink water. Coffee and tea are good in moderation, but skip sugary drinks and limit milk and dairy products to one or two daily servings.   The type of carbohydrate in the diet is more important than the amount. Some sources of carbohydrates, such as vegetables, fruits, whole grains, and beans-are healthier than others.   Finally, stay active  Signed, Thomasene Ripple, DO  10/03/2023 12:11 PM    [facility]

## 2023-10-27 ENCOUNTER — Telehealth: Payer: Self-pay | Admitting: Gastroenterology

## 2023-10-27 NOTE — Telephone Encounter (Signed)
 Good afternoon Dr. Myrtie Neither   We received a referral for this patient to be scheduled for a colonoscopy with you from the [facility]. Patient last had a colonoscopy with Dr. Levon Hedger in 04/2021. Patient states she was not happy with their care and would like to transfer to our practice. Records are in Epic for you to review. Would you please review and advise on scheduling?  Thank you.

## 2023-10-27 NOTE — Telephone Encounter (Signed)
Request received to transfer GI care from outside practice to  GI.  We appreciate the interest in our practice, however due to high demand from patients without established GI providers, we cannot accommodate this transfer.    - H. Myrtie Neither, MD

## 2023-11-12 NOTE — Telephone Encounter (Signed)
Left detailed message with recommendations.

## 2023-11-20 ENCOUNTER — Other Ambulatory Visit: Payer: Self-pay

## 2023-12-23 ENCOUNTER — Ambulatory Visit
Admission: RE | Admit: 2023-12-23 | Discharge: 2023-12-23 | Disposition: A | Discharge status: Home / Self Care | Payer: Medicare Other | Source: Ambulatory Visit | Attending: Hematology | Admitting: Hematology

## 2023-12-23 DIAGNOSIS — Z1231 Encounter for screening mammogram for malignant neoplasm of breast: Secondary | ICD-10-CM

## 2023-12-23 HISTORY — DX: Personal history of irradiation: Z92.3

## 2023-12-23 HISTORY — DX: Personal history of antineoplastic chemotherapy: Z92.21

## 2024-05-20 ENCOUNTER — Other Ambulatory Visit: Payer: Medicare Other

## 2024-06-21 ENCOUNTER — Ambulatory Visit (HOSPITAL_BASED_OUTPATIENT_CLINIC_OR_DEPARTMENT_OTHER)
Admission: RE | Admit: 2024-06-21 | Discharge: 2024-06-21 | Disposition: A | Discharge status: Home / Self Care | Source: Ambulatory Visit | Attending: Hematology | Admitting: Hematology

## 2024-06-21 DIAGNOSIS — E2839 Other primary ovarian failure: Secondary | ICD-10-CM | POA: Diagnosis present

## 2024-07-16 ENCOUNTER — Ambulatory Visit: Payer: Self-pay | Admitting: Hematology

## 2024-07-21 ENCOUNTER — Encounter: Payer: Self-pay | Admitting: Hematology

## 2024-07-21 NOTE — Telephone Encounter (Addendum)
 Called patient to relay results below as per Dr. Lanny, patient had no questions at this time and voiced full understanding.    ----- Message from Onita Lanny, MD sent at 07/16/2024  5:55 PM EST ----- Please let pt know her bone density scan result. She has osteopenia which is little worse than 2-3 years ago. Please encourage her to take OTC calcium  and vit D, and weight bearing exercise. Please  check vitd level on next lab, thanks   Onita Lanny

## 2024-09-21 ENCOUNTER — Inpatient Hospital Stay: Payer: Medicare Other | Admitting: Hematology

## 2024-09-21 ENCOUNTER — Inpatient Hospital Stay: Payer: Medicare Other

## 2024-10-20 ENCOUNTER — Ambulatory Visit: Admitting: Cardiology
